# Patient Record
Sex: Male | Born: 1985
Health system: Southern US, Community
[De-identification: ages and names within clinical notes are randomized; demographics above are authoritative.]

## PROBLEM LIST (undated history)

## (undated) DIAGNOSIS — R600 Localized edema: Secondary | ICD-10-CM

## (undated) DIAGNOSIS — N289 Disorder of kidney and ureter, unspecified: Secondary | ICD-10-CM

## (undated) DIAGNOSIS — R55 Syncope and collapse: Secondary | ICD-10-CM

## (undated) DIAGNOSIS — N189 Chronic kidney disease, unspecified: Secondary | ICD-10-CM

## (undated) DIAGNOSIS — I10 Essential (primary) hypertension: Secondary | ICD-10-CM

## (undated) DIAGNOSIS — N390 Urinary tract infection, site not specified: Secondary | ICD-10-CM

## (undated) DIAGNOSIS — D649 Anemia, unspecified: Secondary | ICD-10-CM

## (undated) DIAGNOSIS — R519 Headache, unspecified: Secondary | ICD-10-CM

## (undated) DIAGNOSIS — Z992 Dependence on renal dialysis: Secondary | ICD-10-CM

## (undated) DIAGNOSIS — K529 Noninfective gastroenteritis and colitis, unspecified: Secondary | ICD-10-CM

## (undated) DIAGNOSIS — R51 Headache: Secondary | ICD-10-CM

## (undated) DIAGNOSIS — N5089 Other specified disorders of the male genital organs: Secondary | ICD-10-CM

## (undated) DIAGNOSIS — N186 End stage renal disease: Secondary | ICD-10-CM

## (undated) HISTORY — DX: Syncope and collapse: R55

## (undated) HISTORY — PX: RENAL BIOPSY: SHX156

## (undated) HISTORY — DX: Essential (primary) hypertension: I10

## (undated) HISTORY — DX: Localized edema: R60.0

## (undated) HISTORY — DX: Other specified disorders of the male genital organs: N50.89

## (undated) HISTORY — DX: Noninfective gastroenteritis and colitis, unspecified: K52.9

## (undated) HISTORY — DX: Chronic kidney disease, unspecified: N18.9

## (undated) HISTORY — DX: Disorder of kidney and ureter, unspecified: N28.9

---

## 2011-08-05 ENCOUNTER — Encounter: Payer: Self-pay | Admitting: *Deleted

## 2011-08-05 ENCOUNTER — Inpatient Hospital Stay (HOSPITAL_COMMUNITY)
Admission: EM | Admit: 2011-08-05 | Discharge: 2011-08-09 | DRG: 700 | Disposition: A | Discharge status: Home / Self Care | Payer: Self-pay | Source: Ambulatory Visit | Attending: Internal Medicine | Admitting: Internal Medicine

## 2011-08-05 DIAGNOSIS — E785 Hyperlipidemia, unspecified: Secondary | ICD-10-CM | POA: Diagnosis present

## 2011-08-05 DIAGNOSIS — E8809 Other disorders of plasma-protein metabolism, not elsewhere classified: Secondary | ICD-10-CM | POA: Diagnosis present

## 2011-08-05 DIAGNOSIS — E876 Hypokalemia: Secondary | ICD-10-CM | POA: Diagnosis present

## 2011-08-05 DIAGNOSIS — N5089 Other specified disorders of the male genital organs: Secondary | ICD-10-CM | POA: Diagnosis present

## 2011-08-05 DIAGNOSIS — R609 Edema, unspecified: Secondary | ICD-10-CM | POA: Diagnosis present

## 2011-08-05 DIAGNOSIS — R600 Localized edema: Secondary | ICD-10-CM | POA: Diagnosis present

## 2011-08-05 DIAGNOSIS — N049 Nephrotic syndrome with unspecified morphologic changes: Principal | ICD-10-CM | POA: Diagnosis present

## 2011-08-05 DIAGNOSIS — N4889 Other specified disorders of penis: Secondary | ICD-10-CM

## 2011-08-05 DIAGNOSIS — D649 Anemia, unspecified: Secondary | ICD-10-CM | POA: Diagnosis present

## 2011-08-05 DIAGNOSIS — R809 Proteinuria, unspecified: Secondary | ICD-10-CM | POA: Diagnosis present

## 2011-08-05 HISTORY — DX: Anemia, unspecified: D64.9

## 2011-08-05 NOTE — ED Notes (Signed)
He has had penis swelling and pain for the past  Week.

## 2011-08-06 ENCOUNTER — Other Ambulatory Visit: Payer: Self-pay

## 2011-08-06 ENCOUNTER — Emergency Department (HOSPITAL_COMMUNITY): Payer: Self-pay

## 2011-08-06 ENCOUNTER — Encounter (HOSPITAL_COMMUNITY): Payer: Self-pay | Admitting: *Deleted

## 2011-08-06 DIAGNOSIS — R600 Localized edema: Secondary | ICD-10-CM

## 2011-08-06 DIAGNOSIS — N5089 Other specified disorders of the male genital organs: Secondary | ICD-10-CM | POA: Diagnosis present

## 2011-08-06 DIAGNOSIS — N049 Nephrotic syndrome with unspecified morphologic changes: Secondary | ICD-10-CM | POA: Diagnosis present

## 2011-08-06 DIAGNOSIS — E876 Hypokalemia: Secondary | ICD-10-CM | POA: Diagnosis present

## 2011-08-06 DIAGNOSIS — E8809 Other disorders of plasma-protein metabolism, not elsewhere classified: Secondary | ICD-10-CM | POA: Diagnosis present

## 2011-08-06 DIAGNOSIS — R809 Proteinuria, unspecified: Secondary | ICD-10-CM | POA: Diagnosis present

## 2011-08-06 HISTORY — DX: Localized edema: R60.0

## 2011-08-06 HISTORY — DX: Other specified disorders of the male genital organs: N50.89

## 2011-08-06 LAB — COMPREHENSIVE METABOLIC PANEL
ALT: 36 U/L (ref 0–53)
AST: 45 U/L — ABNORMAL HIGH (ref 0–37)
Albumin: 1.1 g/dL — ABNORMAL LOW (ref 3.5–5.2)
Alkaline Phosphatase: 82 U/L (ref 39–117)
BUN: 17 mg/dL (ref 6–23)
CO2: 25 mEq/L (ref 19–32)
Calcium: 8.4 mg/dL (ref 8.4–10.5)
Chloride: 106 mEq/L (ref 96–112)
Creatinine, Ser: 1.39 mg/dL — ABNORMAL HIGH (ref 0.50–1.35)
GFR calc Af Amer: 82 mL/min — ABNORMAL LOW (ref >90)
GFR calc non Af Amer: 70 mL/min — ABNORMAL LOW (ref >90)
Glucose, Bld: 76 mg/dL (ref 70–99)
Potassium: 3 mEq/L — ABNORMAL LOW (ref 3.5–5.1)
Sodium: 138 mEq/L (ref 135–145)
Total Bilirubin: 0.2 mg/dL — ABNORMAL LOW (ref 0.3–1.2)
Total Protein: 4.5 g/dL — ABNORMAL LOW (ref 6.0–8.3)

## 2011-08-06 LAB — CBC
HCT: 33.5 % — ABNORMAL LOW (ref 39.0–52.0)
HCT: 32.6 % — ABNORMAL LOW (ref 39.0–52.0)
Hemoglobin: 10.7 g/dL — ABNORMAL LOW (ref 13.0–17.0)
Hemoglobin: 10.5 g/dL — ABNORMAL LOW (ref 13.0–17.0)
MCH: 28.2 pg (ref 26.0–34.0)
MCH: 28.1 pg (ref 26.0–34.0)
MCHC: 31.9 g/dL (ref 30.0–36.0)
MCHC: 32.2 g/dL (ref 30.0–36.0)
MCV: 88.4 fL (ref 78.0–100.0)
MCV: 87.2 fL (ref 78.0–100.0)
Platelets: 226 10*3/uL (ref 150–400)
Platelets: 227 10*3/uL (ref 150–400)
RBC: 3.79 MIL/uL — ABNORMAL LOW (ref 4.22–5.81)
RBC: 3.74 MIL/uL — ABNORMAL LOW (ref 4.22–5.81)
RDW: 13.7 % (ref 11.5–15.5)
RDW: 13.8 % (ref 11.5–15.5)
WBC: 5.8 10*3/uL (ref 4.0–10.5)
WBC: 5 10*3/uL (ref 4.0–10.5)

## 2011-08-06 LAB — DIFFERENTIAL
Basophils Absolute: 0 10*3/uL (ref 0.0–0.1)
Basophils Relative: 0 % (ref 0–1)
Eosinophils Absolute: 0.1 10*3/uL (ref 0.0–0.7)
Eosinophils Relative: 2 % (ref 0–5)
Lymphocytes Relative: 33 % (ref 12–46)
Lymphs Abs: 1.9 10*3/uL (ref 0.7–4.0)
Monocytes Absolute: 0.4 10*3/uL (ref 0.1–1.0)
Monocytes Relative: 8 % (ref 3–12)
Neutro Abs: 3.3 10*3/uL (ref 1.7–7.7)
Neutrophils Relative %: 57 % (ref 43–77)

## 2011-08-06 LAB — URINALYSIS, ROUTINE W REFLEX MICROSCOPIC
Bilirubin Urine: NEGATIVE
Glucose, UA: 100 mg/dL — AB
Ketones, ur: NEGATIVE mg/dL
Leukocytes, UA: NEGATIVE
Nitrite: NEGATIVE
Protein, ur: >300 mg/dL — AB
Specific Gravity, Urine: 1.015 (ref 1.005–1.030)
Urobilinogen, UA: 0.2 mg/dL (ref 0.0–1.0)
pH: 6.5 (ref 5.0–8.0)

## 2011-08-06 LAB — LIPID PANEL
Cholesterol: 415 mg/dL — ABNORMAL HIGH (ref 0–200)
HDL: 67 mg/dL (ref >39)
LDL Cholesterol: 316 mg/dL — ABNORMAL HIGH (ref 0–99)
Total CHOL/HDL Ratio: 6.2 RATIO
Triglycerides: 161 mg/dL — ABNORMAL HIGH (ref <150)
VLDL: 32 mg/dL (ref 0–40)

## 2011-08-06 LAB — RETICULOCYTES
RBC.: 3.74 MIL/uL — ABNORMAL LOW (ref 4.22–5.81)
Retic Count, Absolute: 37.4 10*3/uL (ref 19.0–186.0)
Retic Ct Pct: 1 % (ref 0.4–3.1)

## 2011-08-06 LAB — IRON AND TIBC
Iron: 43 ug/dL (ref 42–135)
Saturation Ratios: 39 % (ref 20–55)
TIBC: 110 ug/dL — ABNORMAL LOW (ref 215–435)
UIBC: 67 ug/dL — ABNORMAL LOW (ref 125–400)

## 2011-08-06 LAB — HEPATITIS B SURFACE ANTIGEN: Hepatitis B Surface Ag: NEGATIVE

## 2011-08-06 LAB — URINE MICROSCOPIC-ADD ON

## 2011-08-06 LAB — VITAMIN B12: Vitamin B-12: 252 pg/mL (ref 211–911)

## 2011-08-06 LAB — HIV ANTIBODY (ROUTINE TESTING W REFLEX): HIV: NONREACTIVE

## 2011-08-06 LAB — CREATININE, SERUM
Creatinine, Ser: 1.52 mg/dL — ABNORMAL HIGH (ref 0.50–1.35)
GFR calc Af Amer: 73 mL/min — ABNORMAL LOW (ref >90)
GFR calc non Af Amer: 63 mL/min — ABNORMAL LOW (ref >90)

## 2011-08-06 LAB — CK: Total CK: 473 U/L — ABNORMAL HIGH (ref 7–232)

## 2011-08-06 LAB — FOLATE: Folate: 15.1 ng/mL

## 2011-08-06 LAB — FERRITIN: Ferritin: 406 ng/mL — ABNORMAL HIGH (ref 22–322)

## 2011-08-06 LAB — PRO B NATRIURETIC PEPTIDE: Pro B Natriuretic peptide (BNP): 48.2 pg/mL (ref 0–125)

## 2011-08-06 LAB — HEPATITIS C ANTIBODY (REFLEX): HCV Ab: NEGATIVE

## 2011-08-06 MED ORDER — ACETAMINOPHEN 325 MG PO TABS
650.0000 mg | ORAL_TABLET | Freq: Four times a day (QID) | ORAL | Status: DC | PRN
  Administered 2011-08-06 – 2011-08-07 (×2): 650 mg via ORAL
  Filled 2011-08-06 (×2): qty 2

## 2011-08-06 MED ORDER — SODIUM CHLORIDE 0.9 % IV SOLN: 250.0000 mL | INTRAVENOUS | Status: DC

## 2011-08-06 MED ORDER — LISINOPRIL 2.5 MG PO TABS
2.5000 mg | ORAL_TABLET | Freq: Every day | ORAL | Status: DC
  Administered 2011-08-06 – 2011-08-09 (×4): 2.5 mg via ORAL
  Filled 2011-08-06 (×4): qty 1

## 2011-08-06 MED ORDER — POTASSIUM CHLORIDE CRYS ER 20 MEQ PO TBCR
60.0000 meq | EXTENDED_RELEASE_TABLET | Freq: Once | ORAL | Status: AC
  Administered 2011-08-06: 60 meq via ORAL
  Filled 2011-08-06: qty 3

## 2011-08-06 MED ORDER — ONDANSETRON HCL 4 MG PO TABS: 4.0000 mg | ORAL_TABLET | Freq: Four times a day (QID) | ORAL | Status: DC | PRN

## 2011-08-06 MED ORDER — SODIUM CHLORIDE 0.9 % IJ SOLN: 3.0000 mL | INTRAMUSCULAR | Status: DC | PRN

## 2011-08-06 MED ORDER — ENOXAPARIN SODIUM 40 MG/0.4ML ~~LOC~~ SOLN
40.0000 mg | SUBCUTANEOUS | Status: DC
  Administered 2011-08-06: 40 mg via SUBCUTANEOUS
  Filled 2011-08-06 (×2): qty 0.4

## 2011-08-06 MED ORDER — POTASSIUM CHLORIDE CRYS ER 20 MEQ PO TBCR
40.0000 meq | EXTENDED_RELEASE_TABLET | Freq: Every day | ORAL | Status: DC
  Administered 2011-08-06 – 2011-08-09 (×4): 40 meq via ORAL
  Filled 2011-08-06 (×4): qty 2

## 2011-08-06 MED ORDER — SODIUM CHLORIDE 0.9 % IJ SOLN: 3.0000 mL | Freq: Two times a day (BID) | INTRAMUSCULAR | Status: DC

## 2011-08-06 MED ORDER — HYDROCODONE-ACETAMINOPHEN 5-325 MG PO TABS
1.0000 | ORAL_TABLET | ORAL | Status: DC | PRN
  Administered 2011-08-07: 1 via ORAL
  Filled 2011-08-06: qty 1

## 2011-08-06 MED ORDER — ONDANSETRON HCL 4 MG/2ML IJ SOLN: 4.0000 mg | Freq: Four times a day (QID) | INTRAMUSCULAR | Status: DC | PRN

## 2011-08-06 MED ORDER — FUROSEMIDE 10 MG/ML IJ SOLN
40.0000 mg | Freq: Two times a day (BID) | INTRAMUSCULAR | Status: DC
  Administered 2011-08-06 – 2011-08-08 (×4): 40 mg via INTRAVENOUS
  Filled 2011-08-06 (×5): qty 4

## 2011-08-06 NOTE — Consult Note (Addendum)
Reason for Consult:Edema and proteinuria Referring Physician: Dr. Evette Cristal is an 25 y.o. male who has no past medical history or prior contact with the medical care system.  He presented to the ED with about a week of scrotal and penile edema. He was found to have a creatinine of 1.39 and >300 mg% protein on urinalysis with a serum albumin of 1.1, and was anemic with a hemoglobin of 10.7, again with no prior history.  He was admitted for further evaluation  He reports some periorbital swelling, pointed out to him by others, for about 2 weeks.  Over the past month he has gained 20-25 pounds.  He wasn't really aware of much leg swelling until it was pointed out to him today, but reports abdominal swelling for about a month.  He does not use NSAID's, has no family history of renal disease, has had no drug or occupational chemical exposures that he is aware of.  He denies change in urine output, tea or coca cola colored urine.  He has noted some phlegm in his throat from time to time, but no cough or hemoptysis, no skin rash, no arthritis or arthralgias.  He does have several tattoos, some of which were done by a friend, others done at a reputable tattoo parlor.  He is sexually active but with no risk that he is aware of for HIV.  PMH: As above - none  MEDS PTA: - no chronic meds; he took an OTC [employer] brand pain reliever for a headache about a week PTA CURRENT MEDS:     . enoxaparin  40 mg Subcutaneous Q24H  . furosemide  40 mg Intravenous BID  . potassium chloride  40 mEq Oral Daily  . potassium chloride  60 mEq Oral Once  . DISCONTD: sodium chloride  3 mL Intravenous Q12H  .  PSH:  History reviewed. No pertinent past surgical history.  Allergies: No Known Allergies  Family History:  He reports both parents alive and well, not sure about any history in other family members  Social History:  reports that he has been passively smoking Cigars.  He does not have any smokeless  tobacco history on file. He reports that he drinks alcohol. He reports that he does not use illicit drugs.  ROS: 20-25 lb weight gain, periorbital swelling, occas headache, scrotal and penile swelling, abdominal swelling; no fevers, nausea, vomiting, hematuria, dysuria, joint pain or redness, skin rash, cough, hemoptysis, arthritis or arthralgia; all other ROS essentially negative  Blood pressure 152/90, pulse 83, temperature 98.6 F (37 C), temperature source Oral, resp. rate 16, height 5\' 10"  (1.778 m), weight 87.952 kg (193 lb 14.4 oz), SpO2 100.00%.  PE:  WDWN African American male. NAD HEENT: Anicteric sclerae.  PERRLA.  Mild periorbital edema SKIN: No rashes; tattoos on neck, arms, lower abdomen Lungs: Clear  COR: S1S2 No S3 ABD: BS present.  No bruits.  Liver span normal to percussion.  Tattoo on lower abdomen.  Cannot feel spleen. EXT: 3+ pre-tibial, 4+ pedal edema GU: Penile and scrotal edema  LAB:  Creatinine, Ser  Date/Time Value Range Status  08/06/2011  2:29 AM 1.39* 0.50-1.35 (mg/dL) Final    Results for orders placed during the hospital encounter of 08/05/11 (from the past 48 hour(s))  PRO B NATRIURETIC PEPTIDE     Status: Normal   Collection Time   08/06/11  2:28 AM      Component Value Range Comment   BNP, POC 48.2  0 -  125 (pg/mL)   COMPREHENSIVE METABOLIC PANEL     Status: Abnormal   Collection Time   08/06/11  2:29 AM      Component Value Range Comment   Sodium 138  135 - 145 (mEq/L)    Potassium 3.0 (*) 3.5 - 5.1 (mEq/L)    Chloride 106  96 - 112 (mEq/L)    CO2 25  19 - 32 (mEq/L)    Glucose, Bld 76  70 - 99 (mg/dL)    BUN 17  6 - 23 (mg/dL)    Creatinine, Ser 1.39 (*) 0.50 - 1.35 (mg/dL)    Calcium 8.4  8.4 - 10.5 (mg/dL)    Total Protein 4.5 (*) 6.0 - 8.3 (g/dL)    Albumin 1.1 (*) 3.5 - 5.2 (g/dL)    AST 45 (*) 0 - 37 (U/L)    ALT 36  0 - 53 (U/L)    Alkaline Phosphatase 82  39 - 117 (U/L)    Total Bilirubin 0.2 (*) 0.3 - 1.2 (mg/dL)    GFR calc non  Af Amer 70 (*) >90 (mL/min)    GFR calc Af Amer 82 (*) >90 (mL/min)   CBC     Status: Abnormal   Collection Time   08/06/11  2:29 AM      Component Value Range Comment   WBC 5.8  4.0 - 10.5 (K/uL)    RBC 3.79 (*) 4.22 - 5.81 (MIL/uL)    Hemoglobin 10.7 (*) 13.0 - 17.0 (g/dL)    HCT 33.5 (*) 39.0 - 52.0 (%)    MCV 88.4  78.0 - 100.0 (fL)    MCH 28.2  26.0 - 34.0 (pg)    MCHC 31.9  30.0 - 36.0 (g/dL)    RDW 13.7  11.5 - 15.5 (%)    Platelets 226  150 - 400 (K/uL)   DIFFERENTIAL     Status: Normal   Collection Time   08/06/11  2:29 AM      Component Value Range Comment   Neutrophils Relative 57  43 - 77 (%)    Neutro Abs 3.3  1.7 - 7.7 (K/uL)    Lymphocytes Relative 33  12 - 46 (%)    Lymphs Abs 1.9  0.7 - 4.0 (K/uL)    Monocytes Relative 8  3 - 12 (%)    Monocytes Absolute 0.4  0.1 - 1.0 (K/uL)    Eosinophils Relative 2  0 - 5 (%)    Eosinophils Absolute 0.1  0.0 - 0.7 (K/uL)    Basophils Relative 0  0 - 1 (%)    Basophils Absolute 0.0  0.0 - 0.1 (K/uL)   URINALYSIS, ROUTINE W REFLEX MICROSCOPIC     Status: Abnormal   Collection Time   08/06/11  5:56 AM      Component Value Range Comment   Color, Urine YELLOW  YELLOW     Appearance CLEAR  CLEAR     Specific Gravity, Urine 1.015  1.005 - 1.030     pH 6.5  5.0 - 8.0     Glucose, UA 100 (*) NEGATIVE (mg/dL)    Hgb urine dipstick MODERATE (*) NEGATIVE     Bilirubin Urine NEGATIVE  NEGATIVE     Ketones, ur NEGATIVE  NEGATIVE (mg/dL)    Protein, ur >300 (*) NEGATIVE (mg/dL)    Urobilinogen, UA 0.2  0.0 - 1.0 (mg/dL)    Nitrite NEGATIVE  NEGATIVE     Leukocytes, UA NEGATIVE  NEGATIVE  URINE MICROSCOPIC-ADD ON     Status: Abnormal   Collection Time   08/06/11  5:56 AM      Component Value Range Comment   WBC, UA 3-6  <3 (WBC/hpf)    RBC / HPF 3-6  <3 (RBC/hpf)    Bacteria, UA RARE  RARE     Casts HYALINE CASTS (*) NEGATIVE     Urine-Other MUCOUS PRESENT       Dg Chest 2 View  08/06/2011  *RADIOLOGY REPORT*  Clinical Data:  Lower extremity and facial edema.  CHEST - 2 VIEW  Comparison: None.  Findings: Lungs are clear. No pleural effusion or pneumothorax. The cardiomediastinal contours are within normal limits. The visualized bones and soft tissues are without significant appreciable abnormality.  IMPRESSION: No acute cardiopulmonary process.  Original Report Authenticated By: Suanne Marker, M.D.   US Scrotum  08/06/2011  *RADIOLOGY REPORT*  Clinical Data: Edema, scrotal swelling.  ULTRASOUND OF SCROTUM  Technique:  Complete ultrasound examination of the testicles, epididymis, and other scrotal structures was performed.  Comparison:  None.  Findings:  Right testis:  Measures 4.5 x 3.7 x 3.1 cm.  Testicular microlithiasis noted.  Color Doppler flow documented.  Left testis:  Measures 4.7 x 3.8 x 3.0 cm.  Testicular microlithiasis noted.  Color Doppler flow documented.  Right epididymis:  Prominent in size however with normal color Doppler flow and echogenicity.  Left epididymis:  Prominence size however with normal color Doppler flow and echogenicity.  Hydrocele:  Absent  Varicocele:  Absent  Significant scrotal wall edema.  IMPRESSION: Bilateral testicular microlithiasis.  Otherwise normal sonographic appearance to the testicles.  Prominent epididymides however normal color Doppler flow.  Significant scrotal wall edema.  Original Report Authenticated By: Suanne Marker, M.D.     ASSESSMENT   25 year old black male with one month history of weight gain, with progressive swelling who has proteinuria, hypoalbuminemia, edema, hypertension presumably of recent onset    Renal insufficiency, proteinuria -- findings consistent with nephrotic syndrome.  Precipitating factors or risk factors not apparent from history. Could have minimal change, FSGS, SLE (less likely with lack of other constitutional symptoms) or other primary glomerulopathy  Anemia of uncertain duration  Hypertension of uncertain  duration  RECOMMENDATIONS   Will need full serologic workup, 24 hour urine studies and   renal biopsy.   Check ANA, complements, HIV, hepatitis serologies, Renal  ultrasound, spot [id number] (and 24 hour urine)   Use SCD's for DVT prophylaxis as renal biopsy will be   Needed (will get prelim labs, try to schedule bx for Friday)   Anemia workup   Check lipids   Agree with lasix  ACEi forr hypertension  Thanks for the consult.  Will follow.    Dakin Madani B 08/06/2011, 1:06 PM

## 2011-08-06 NOTE — Progress Notes (Signed)
UR review completed. 

## 2011-08-06 NOTE — H&P (Signed)
PCP:   No primary provider on file.   Chief Complaint:  Penile/scrotal edema  HPI: This is a 25 y/o [place] American male with no significant past medical history.  Patient reports that for the past week, he has noted increasing penile/scrotal edema.  Although he does not describe it as painful, he does feel uncomfortable.  He also feels that he has been gaining weight.  He has been told by his coworkers that his eyes are looking puffy, and that his legs appear to be swelling as well.  His able to pass urine, denies any dysuria.  He denies any sore throat, fever, nasal congestion, skin eruptions, chest pain, trouble breathing. In the ED he was evaluated and found to have significant proteinuria and low serum protein/albumin.  He was also noted to have significant scrotal/penile and LE edema.  He has been referred for admission for further work up.  Allergies:  No Known Allergies   History reviewed. No pertinent past medical history.   Prior to Admission medications   Medication Sig Start Date End Date Taking? Authorizing Provider  acetaminophen (TYLENOL) 325 MG tablet Take 650 mg by mouth every 6 (six) hours as needed. pain    Yes Historical Provider, MD    Social History:  reports that he has never smoked. He does not have any smokeless tobacco history on file. He reports that he drinks alcohol. He reports that he does not use illicit drugs.  Family History:  None that the patient is aware of.  Review of Systems:  Constitutional: Denies chills, diaphoresis, appetite change and fatigue, he does report occasional subjective fever  HEENT: Denies photophobia, eye pain, redness, hearing loss, ear pain, congestion, sore throat, rhinorrhea, sneezing, mouth sores, trouble swallowing, neck pain, neck stiffness and tinnitus.   Respiratory: Denies SOB, DOE, chest tightness,  and wheezing. He does report coughing while laughing on occasion  Cardiovascular: Denies chest pain, palpitations.  Positive for leg swelling bilaterally  Gastrointestinal: Denies vomiting, abdominal pain, diarrhea, constipation, blood in stool and abdominal distention. He has had nausea but now vomiting.  Genitourinary: Denies dysuria, urgency, hematuria, flank pain and difficulty urinating. He has noted some urinary frequency and scrotal/penile edema. Musculoskeletal: Denies myalgias, back pain, joint swelling, arthralgias and gait problem.  Skin: Denies pallor, rash and wound.  Neurological: Denies dizziness, seizures, syncope, weakness, light-headedness, numbness and headaches.  Hematological: Denies adenopathy. Easy bruising, personal or family bleeding history  Psychiatric/Behavioral: Denies suicidal ideation, mood changes, confusion, nervousness, sleep disturbance and agitation   Physical Exam: Blood pressure 131/85, pulse 82, temperature 97.9 F (36.6 C), temperature source Oral, resp. rate 16, SpO2 98.00%. Constitutional: Vital signs reviewed.  Patient is a well-developed and well-nourished male in no acute distress and cooperative with exam. Alert and oriented x3.  Head: Normocephalic and atraumatic Ear: TM normal bilaterally Mouth: no erythema or exudates, MMM, petechiae noted over tongue Eyes: PERRL, EOMI, conjunctivae normal, No scleral icterus.  Neck: Supple, Trachea midline normal ROM, No JVD, mass, thyromegaly, or carotid bruit present.  Cardiovascular: RRR, S1 normal, S2 normal, no MRG, pulses symmetric and intact bilaterally Pulmonary/Chest: CTAB, no wheezes, rales, or rhonchi Abdominal: Soft. Non-tender, non-distended, bowel sounds are normal, no masses, organomegaly, or guarding present.  GU: no CVA tenderness, scrotal and penile edema noted, no tenderness/ discharge/ erythema/ or calor noted Musculoskeletal: No joint deformities, erythema, or stiffness, ROM full and no nontender Hematology: no cervical, inginal, or axillary adenopathy.  Neurological: A&O x3, Strength is normal and  symmetric bilaterally,  cranial nerve II-XII are grossly intact, no focal motor deficit, sensory intact to light touch bilaterally.  Skin: Warm, dry and intact. No rash, cyanosis, or clubbing.  Psychiatric: Normal mood and affect. speech and behavior is normal. Judgment and thought content normal. Cognition and memory are normal.    Labs on Admission:  Results for orders placed during the hospital encounter of 08/05/11 (from the past 48 hour(s))  PRO B NATRIURETIC PEPTIDE     Status: Normal   Collection Time   08/06/11  2:28 AM      Component Value Range Comment   BNP, POC 48.2  0 - 125 (pg/mL)   COMPREHENSIVE METABOLIC PANEL     Status: Abnormal   Collection Time   08/06/11  2:29 AM      Component Value Range Comment   Sodium 138  135 - 145 (mEq/L)    Potassium 3.0 (*) 3.5 - 5.1 (mEq/L)    Chloride 106  96 - 112 (mEq/L)    CO2 25  19 - 32 (mEq/L)    Glucose, Bld 76  70 - 99 (mg/dL)    BUN 17  6 - 23 (mg/dL)    Creatinine, Ser 1.39 (*) 0.50 - 1.35 (mg/dL)    Calcium 8.4  8.4 - 10.5 (mg/dL)    Total Protein 4.5 (*) 6.0 - 8.3 (g/dL)    Albumin 1.1 (*) 3.5 - 5.2 (g/dL)    AST 45 (*) 0 - 37 (U/L)    ALT 36  0 - 53 (U/L)    Alkaline Phosphatase 82  39 - 117 (U/L)    Total Bilirubin 0.2 (*) 0.3 - 1.2 (mg/dL)    GFR calc non Af Amer 70 (*) >90 (mL/min)    GFR calc Af Amer 82 (*) >90 (mL/min)   CBC     Status: Abnormal   Collection Time   08/06/11  2:29 AM      Component Value Range Comment   WBC 5.8  4.0 - 10.5 (K/uL)    RBC 3.79 (*) 4.22 - 5.81 (MIL/uL)    Hemoglobin 10.7 (*) 13.0 - 17.0 (g/dL)    HCT 33.5 (*) 39.0 - 52.0 (%)    MCV 88.4  78.0 - 100.0 (fL)    MCH 28.2  26.0 - 34.0 (pg)    MCHC 31.9  30.0 - 36.0 (g/dL)    RDW 13.7  11.5 - 15.5 (%)    Platelets 226  150 - 400 (K/uL)   DIFFERENTIAL     Status: Normal   Collection Time   08/06/11  2:29 AM      Component Value Range Comment   Neutrophils Relative 57  43 - 77 (%)    Neutro Abs 3.3  1.7 - 7.7 (K/uL)    Lymphocytes  Relative 33  12 - 46 (%)    Lymphs Abs 1.9  0.7 - 4.0 (K/uL)    Monocytes Relative 8  3 - 12 (%)    Monocytes Absolute 0.4  0.1 - 1.0 (K/uL)    Eosinophils Relative 2  0 - 5 (%)    Eosinophils Absolute 0.1  0.0 - 0.7 (K/uL)    Basophils Relative 0  0 - 1 (%)    Basophils Absolute 0.0  0.0 - 0.1 (K/uL)   URINALYSIS, ROUTINE W REFLEX MICROSCOPIC     Status: Abnormal   Collection Time   08/06/11  5:56 AM      Component Value Range Comment   Color, Urine YELLOW  YELLOW     Appearance CLEAR  CLEAR     Specific Gravity, Urine 1.015  1.005 - 1.030     pH 6.5  5.0 - 8.0     Glucose, UA 100 (*) NEGATIVE (mg/dL)    Hgb urine dipstick MODERATE (*) NEGATIVE     Bilirubin Urine NEGATIVE  NEGATIVE     Ketones, ur NEGATIVE  NEGATIVE (mg/dL)    Protein, ur >300 (*) NEGATIVE (mg/dL)    Urobilinogen, UA 0.2  0.0 - 1.0 (mg/dL)    Nitrite NEGATIVE  NEGATIVE     Leukocytes, UA NEGATIVE  NEGATIVE    URINE MICROSCOPIC-ADD ON     Status: Abnormal   Collection Time   08/06/11  5:56 AM      Component Value Range Comment   WBC, UA 3-6  <3 (WBC/hpf)    RBC / HPF 3-6  <3 (RBC/hpf)    Bacteria, UA RARE  RARE     Casts HYALINE CASTS (*) NEGATIVE     Urine-Other MUCOUS PRESENT       Radiological Exams on Admission: No results found.  Assessment/Plan   Nephrotic syndrome:  Unclear etiology, Patient has >300 protein in urine.  Will check spot protein/creatinine ratio, and 24hr urine for protein/creatinine.  I have asked the Renal service to see the patient in consultation.  Will check lipid panel.   Serum albumin decreased:  See above   Proteinuria: see above   Edema of lower extremity:  Start lasix and monitor urine output   Scrotal edema:  Ultrasound of scrotum negative, will give lasix and monitor for improvement   Hypokalemia:  Replete, check Mg   Anemia: check anemia panel   Time Spent on Admission: 68mins  Xylon Croom 08/06/2011, 8:52 AM

## 2011-08-06 NOTE — ED Notes (Signed)
Patient is resting comfortably. 

## 2011-08-06 NOTE — Progress Notes (Signed)
Christopher Wang [id number] Code Status: Full Admission Data: 08/06/2011 3:50 PM Attending Provider:  [facility] PCP:No primary provider on file. Consults/ Treatment Team:  Nephrology  Christopher Wang is a 25 y.o. male patient admitted from ED awake, alert - orient. X 3 - no acute distress noted.  VSS - Blood pressure 131/82, pulse 83, temperature 98.6 F (37 C), temperature source Oral, resp. rate 16, height 5\' 10"  (1.778 m), weight 87.952 kg (193 lb 14.4 oz), SpO2 100.00%.  IV in right hand (11/6) NSL.  Allergies:  No Known Allergies    Medications Prior to Admission  Medication Dose Route Frequency Provider Last Rate Last Dose  . acetaminophen (TYLENOL) tablet 650 mg  650 mg Oral Q6H PRN Jehanzeb Memon   650 mg at 08/06/11 1131  . enoxaparin (LOVENOX) injection 40 mg  40 mg Subcutaneous Q24H Jehanzeb Memon   40 mg at 08/06/11 1237  . furosemide (LASIX) injection 40 mg  40 mg Intravenous BID Jehanzeb Memon      . HYDROcodone-acetaminophen (NORCO) 5-325 MG per tablet 1-2 tablet  1-2 tablet Oral Q4H PRN Jehanzeb Memon      . lisinopril (PRINIVIL,ZESTRIL) tablet 2.5 mg  2.5 mg Oral Daily Lucrezia Starch, MD   2.5 mg at 08/06/11 1524  . ondansetron (ZOFRAN) tablet 4 mg  4 mg Oral Q6H PRN Jehanzeb Memon       Or  . ondansetron (ZOFRAN) injection 4 mg  4 mg Intravenous Q6H PRN Jehanzeb Memon      . potassium chloride SA (K-DUR,KLOR-CON) CR tablet 40 mEq  40 mEq Oral Daily Jehanzeb Memon   40 mEq at 08/06/11 1237  . potassium chloride SA (K-DUR,KLOR-CON) CR tablet 60 mEq  60 mEq Oral Once Trisha Mangle, MD   60 mEq at 08/06/11 0345  . DISCONTD: 0.9 %  sodium chloride infusion  250 mL Intravenous Continuous Jehanzeb Memon      . DISCONTD: sodium chloride 0.9 % injection 3 mL  3 mL Intravenous Q12H Jehanzeb Memon      . DISCONTD: sodium chloride 0.9 % injection 3 mL  3 mL Intravenous PRN Jehanzeb Memon       No current outpatient prescriptions on file as of 08/06/2011.    Orientation to  room, and floor completed with information packet given to patient/family.  Patient declined safety video at this time.  Admission INP armband ID verified with patient/family, and in place.   SR up x 2, fall assessment complete, with patient: able to verbalize understanding of risk associated with falls, and verbalized understanding to call nsg before up out of bed.  Call light within reach, patient able to voice, and demonstrate understanding.  Skin, clean-dry- intact without evidence of bruising, or skin tears.  Bilateral lower edema noted with severe scrotal and penile edema. 24 hour urine collection started on admission. No evidence of skin break down noted on exam.  Will cont to eval and treat per MD orders.  Driggers, Allene Pyo, RN 08/06/2011 3:50 PM

## 2011-08-06 NOTE — ED Notes (Signed)
Pt to ED for eval of swelling of his penis; pt reports that for the past week, his penis has been swelling; pt reports discomfort when walking, but denies pain;  Pt also noted to have swelling to lower extremities; pt denies dysuria or abnormal discharge

## 2011-08-06 NOTE — ED Notes (Signed)
Pt unable to void at this time. 

## 2011-08-06 NOTE — ED Provider Notes (Signed)
History     CSN: [id number] Arrival date & time: 08/05/2011 11:30 PM   First MD Initiated Contact with Patient 08/06/11 0117      Chief Complaint  Patient presents with  . Groin Swelling    (Consider location/radiation/quality/duration/timing/severity/associated sxs/prior treatment) HPI Comments: Acute onset scrotal and penile swelling which has progressively worsened over the past week. It is relatively painless but uncomfortable with ambulation. There is no urinary symptoms. He has no chest pain, shortness of breath, abdominal pain. He also notes some mild lower terminates swelling. He has no primary care physician.  Patient is a 25 y.o. male presenting with testicular pain. The history is provided by the patient. No language interpreter was used.  Testicle Pain This is a new problem. The current episode started more than 2 days ago (about one week). The problem occurs constantly. The problem has been gradually worsening. Pertinent negatives include no chest pain, no abdominal pain, no headaches and no shortness of breath. The symptoms are aggravated by nothing. The symptoms are relieved by nothing. He has tried nothing for the symptoms.    History reviewed. No pertinent past medical history.  History reviewed. No pertinent past surgical history.  No family history on file.  History  Substance Use Topics  . Smoking status: Never Smoker   . Smokeless tobacco: Not on file  . Alcohol Use: Yes      Review of Systems  Constitutional: Negative for fever, activity change, appetite change and fatigue.  HENT: Negative for congestion, sore throat, rhinorrhea, neck pain and neck stiffness.   Respiratory: Negative for cough and shortness of breath.   Cardiovascular: Negative for chest pain and palpitations.  Gastrointestinal: Negative for nausea, vomiting and abdominal pain.  Genitourinary: Positive for penile swelling and scrotal swelling. Negative for dysuria, urgency, frequency,  flank pain and testicular pain.  Musculoskeletal: Negative for back pain.  Neurological: Negative for dizziness, light-headedness, numbness and headaches.  All other systems reviewed and are negative.    Allergies  Review of patient's allergies indicates no known allergies.  Home Medications   Current Outpatient Rx  Name Route Sig Dispense Refill  . ACETAMINOPHEN 325 MG PO TABS Oral Take 650 mg by mouth every 6 (six) hours as needed. pain       BP 131/85  Pulse 82  Temp(Src) 97.9 F (36.6 C) (Oral)  Resp 16  SpO2 98%  Physical Exam  Nursing note and vitals reviewed. Constitutional: He is oriented to person, place, and time. He appears well-developed and well-nourished. No distress.  HENT:  Head: Normocephalic and atraumatic.  Mouth/Throat: Oropharynx is clear and moist.  Eyes: Conjunctivae and EOM are normal. Pupils are equal, round, and reactive to light.  Neck: Normal range of motion. Neck supple.  Cardiovascular: Normal rate, regular rhythm, normal heart sounds and intact distal pulses.  Exam reveals no gallop and no friction rub.   No murmur heard. Pulmonary/Chest: Effort normal and breath sounds normal. No respiratory distress.  Abdominal: Soft. Bowel sounds are normal. There is no tenderness.  Genitourinary:       Penile edema and scrotal edema which is significant.  Musculoskeletal: He exhibits edema (3+ edema).  Neurological: He is alert and oriented to person, place, and time.  Skin: Skin is warm and dry. No rash noted.    ED Course  Procedures (including critical care time)   Date: 08/06/2011  Rate: 84  Rhythm: normal sinus rhythm  QRS Axis: normal  Intervals: normal  ST/T Wave abnormalities: ST  elevations diffusely and early repolarization  Conduction Disutrbances:none  Narrative Interpretation:   Old EKG Reviewed: none available  Labs Reviewed  URINALYSIS, ROUTINE W REFLEX MICROSCOPIC - Abnormal; Notable for the following:    Glucose, UA 100 (*)      Hgb urine dipstick MODERATE (*)    Protein, ur >300 (*)    All other components within normal limits  COMPREHENSIVE METABOLIC PANEL - Abnormal; Notable for the following:    Potassium 3.0 (*)    Creatinine, Ser 1.39 (*)    Total Protein 4.5 (*)    Albumin 1.1 (*)    AST 45 (*)    Total Bilirubin 0.2 (*)    GFR calc non Af Amer 70 (*)    GFR calc Af Amer 82 (*)    All other components within normal limits  CBC - Abnormal; Notable for the following:    RBC 3.79 (*)    Hemoglobin 10.7 (*)    HCT 33.5 (*)    All other components within normal limits  URINE MICROSCOPIC-ADD ON - Abnormal; Notable for the following:    Casts HYALINE CASTS (*)    All other components within normal limits  PRO B NATRIURETIC PEPTIDE  DIFFERENTIAL   Dg Chest 2 View  08/06/2011  *RADIOLOGY REPORT*  Clinical Data: Lower extremity and facial edema.  CHEST - 2 VIEW  Comparison: None.  Findings: Lungs are clear. No pleural effusion or pneumothorax. The cardiomediastinal contours are within normal limits. The visualized bones and soft tissues are without significant appreciable abnormality.  IMPRESSION: No acute cardiopulmonary process.  Original Report Authenticated By: Suanne Marker, M.D.   US Scrotum  08/06/2011  *RADIOLOGY REPORT*  Clinical Data: Edema, scrotal swelling.  ULTRASOUND OF SCROTUM  Technique:  Complete ultrasound examination of the testicles, epididymis, and other scrotal structures was performed.  Comparison:  None.  Findings:  Right testis:  Measures 4.5 x 3.7 x 3.1 cm.  Testicular microlithiasis noted.  Color Doppler flow documented.  Left testis:  Measures 4.7 x 3.8 x 3.0 cm.  Testicular microlithiasis noted.  Color Doppler flow documented.  Right epididymis:  Prominent in size however with normal color Doppler flow and echogenicity.  Left epididymis:  Prominence size however with normal color Doppler flow and echogenicity.  Hydrocele:  Absent  Varicocele:  Absent  Significant scrotal wall  edema.  IMPRESSION: Bilateral testicular microlithiasis.  Otherwise normal sonographic appearance to the testicles.  Prominent epididymides however normal color Doppler flow.  Significant scrotal wall edema.  Original Report Authenticated By: Suanne Marker, M.D.     1. Scrotal edema   2. Penile edema   3. Nephrotic syndrome       MDM  Patient with no primary care. Acute onset of scrotal edema, penile edema, lower term edema secondary to nephrotic syndrome. He has exceedingly high level of protein in his urine. Creatinine is 1.39 which is slightly elevated. Hepatic functions relatively unremarkable. BMP negative. EKG relatively unremarkable. Scrotal ultrasound showed no testicular abnormalities but did show scrotal edema. I discussed the case with the [facility] hospitalists wouldn't the patient for further evaluation and treatment        Trisha Mangle, MD 08/06/11 [phone]

## 2011-08-07 ENCOUNTER — Other Ambulatory Visit: Payer: Self-pay | Admitting: Physician Assistant

## 2011-08-07 ENCOUNTER — Inpatient Hospital Stay (HOSPITAL_COMMUNITY): Payer: Self-pay

## 2011-08-07 DIAGNOSIS — E785 Hyperlipidemia, unspecified: Secondary | ICD-10-CM | POA: Diagnosis present

## 2011-08-07 LAB — COMPREHENSIVE METABOLIC PANEL
ALT: 23 U/L (ref 0–53)
AST: 21 U/L (ref 0–37)
Albumin: 0.9 g/dL — ABNORMAL LOW (ref 3.5–5.2)
Alkaline Phosphatase: 55 U/L (ref 39–117)
BUN: 15 mg/dL (ref 6–23)
CO2: 28 mEq/L (ref 19–32)
Calcium: 7.9 mg/dL — ABNORMAL LOW (ref 8.4–10.5)
Chloride: 107 mEq/L (ref 96–112)
Creatinine, Ser: 1.44 mg/dL — ABNORMAL HIGH (ref 0.50–1.35)
GFR calc Af Amer: 78 mL/min — ABNORMAL LOW (ref >90)
GFR calc non Af Amer: 67 mL/min — ABNORMAL LOW (ref >90)
Glucose, Bld: 87 mg/dL (ref 70–99)
Potassium: 3.4 mEq/L — ABNORMAL LOW (ref 3.5–5.1)
Sodium: 139 mEq/L (ref 135–145)
Total Bilirubin: 0.2 mg/dL — ABNORMAL LOW (ref 0.3–1.2)
Total Protein: 3.8 g/dL — ABNORMAL LOW (ref 6.0–8.3)

## 2011-08-07 LAB — CBC
HCT: 31.2 % — ABNORMAL LOW (ref 39.0–52.0)
Hemoglobin: 10 g/dL — ABNORMAL LOW (ref 13.0–17.0)
MCH: 27.9 pg (ref 26.0–34.0)
MCHC: 32.1 g/dL (ref 30.0–36.0)
MCV: 87.2 fL (ref 78.0–100.0)
Platelets: 189 10*3/uL (ref 150–400)
RBC: 3.58 MIL/uL — ABNORMAL LOW (ref 4.22–5.81)
RDW: 13.8 % (ref 11.5–15.5)
WBC: 5.1 10*3/uL (ref 4.0–10.5)

## 2011-08-07 LAB — MAGNESIUM: Magnesium: 1.7 mg/dL (ref 1.5–2.5)

## 2011-08-07 LAB — ANA: Anti Nuclear Antibody(ANA): NEGATIVE

## 2011-08-07 LAB — C4 COMPLEMENT: Complement C4, Body Fluid: 31 mg/dL (ref 10–40)

## 2011-08-07 LAB — CREATININE, URINE, 24 HOUR
Collection Interval-UCRE24: 24 hours
Creatinine, 24H Ur: 1687 mg/d (ref 800–2000)
Creatinine, Urine: 46.85 mg/dL
Urine Total Volume-UCRE24: 3600 mL

## 2011-08-07 LAB — TSH: TSH: 3.713 u[IU]/mL (ref 0.350–4.500)

## 2011-08-07 LAB — ANTISTREPTOLYSIN O TITER: ASO: <25 IU/mL (ref 0–408)

## 2011-08-07 LAB — PROTEIN, URINE, 24 HOUR
Collection Interval-UPROT: 24 hours
Protein, 24H Urine: 14436 mg/d — ABNORMAL HIGH (ref 50–100)
Urine Total Volume-UPROT: 3600 mL

## 2011-08-07 LAB — C3 COMPLEMENT: C3 Complement: 102 mg/dL (ref 90–180)

## 2011-08-07 LAB — HEMOGLOBIN A1C
Hgb A1c MFr Bld: 4.8 % (ref <5.7)
Mean Plasma Glucose: 91 mg/dL (ref <117)

## 2011-08-07 LAB — PROTEIN / CREATININE RATIO, URINE
Creatinine, Urine: 46.85 mg/dL
Protein Creatinine Ratio: 8.72 — ABNORMAL HIGH (ref 0.00–0.15)
Total Protein, Urine: 408.5 mg/dL

## 2011-08-07 MED ORDER — POTASSIUM CHLORIDE CRYS ER 20 MEQ PO TBCR
40.0000 meq | EXTENDED_RELEASE_TABLET | Freq: Every day | ORAL | Status: DC
  Administered 2011-08-07: 40 meq via ORAL
  Filled 2011-08-07: qty 1
  Filled 2011-08-07: qty 2

## 2011-08-07 MED ORDER — PREDNISONE 50 MG PO TABS
60.0000 mg | ORAL_TABLET | Freq: Every day | ORAL | Status: DC
  Administered 2011-08-08 – 2011-08-09 (×2): 60 mg via ORAL
  Filled 2011-08-07 (×3): qty 1

## 2011-08-07 MED ORDER — METHYLPREDNISOLONE SODIUM SUCC 125 MG IJ SOLR
100.0000 mg | Freq: Once | INTRAMUSCULAR | Status: AC
  Administered 2011-08-07: 100 mg via INTRAVENOUS
  Filled 2011-08-07: qty 2

## 2011-08-07 NOTE — Progress Notes (Signed)
Subjective: Interval History: has complaints head ache. Good urine output..  Objective: Vital signs in last 24 hours: Temp:  [98.1 F (36.7 Wang)-98.7 F (37.1 Wang)] 98.1 F (36.7 Wang) (11/08 0449) Pulse Rate:  [77-99] 79  (11/08 0449) Resp:  [16-18] 18  (11/08 0449) BP: (102-152)/(63-90) 102/66 mmHg (11/08 0449) SpO2:  [96 %-100 %] 99 % (11/08 0449) Weight:  [87.635 kg (193 lb 3.2 oz)-88.315 kg (194 lb 11.2 oz)] 193 lb 3.2 oz (87.635 kg) (11/08 0124) Weight change:   Intake/Output from previous day: 11/07 0701 - 11/08 0700 In: 2220 [P.O.:2220] Out: 1500 [Urine:1500] Intake/Output this shift:    General appearance: alert and cooperative Back: symmetric,2-3 + presacral edema Resp: clear to auscultation bilaterally Cardio: regular rate and rhythm Extremities: 3 plus edema  Lab Results:  Basename 08/07/11 0540 08/06/11 1520  WBC 5.1 5.0  HGB 10.0* 10.5*  HCT 31.2* 32.6*  PLT 189 227   BMET:  Basename 08/07/11 0540 08/06/11 1520 08/06/11 0229  NA 139 -- 138  K 3.4* -- 3.0*  CL 107 -- 106  CO2 28 -- 25  GLUCOSE 87 -- 76  BUN 15 -- 17  CREATININE 1.44* 1.52* --  CALCIUM 7.9* -- 8.4   No results found for this basename: PTH:2 in the last 72 hours Iron Studies:  Basename 08/06/11 1520  IRON 43  TIBC 110*  TRANSFERRIN --  FERRITIN 406*    Studies/Results: Dg Chest 2 View  08/06/2011  *RADIOLOGY REPORT*  Clinical Data: Lower extremity and facial edema.  CHEST - 2 VIEW  Comparison: None.  Findings: Lungs are clear. No pleural effusion or pneumothorax. The cardiomediastinal contours are within normal limits. The visualized bones and soft tissues are without significant appreciable abnormality.  IMPRESSION: No acute cardiopulmonary process.  Original Report Authenticated By: Suanne Marker, M.D.   US Scrotum  08/06/2011  *RADIOLOGY REPORT*  Clinical Data: Edema, scrotal swelling.  ULTRASOUND OF SCROTUM  Technique:  Complete ultrasound examination of the testicles,  epididymis, and other scrotal structures was performed.  Comparison:  None.  Findings:  Right testis:  Measures 4.5 x 3.7 x 3.1 cm.  Testicular microlithiasis noted.  Color Doppler flow documented.  Left testis:  Measures 4.7 x 3.8 x 3.0 cm.  Testicular microlithiasis noted.  Color Doppler flow documented.  Right epididymis:  Prominent in size however with normal color Doppler flow and echogenicity.  Left epididymis:  Prominence size however with normal color Doppler flow and echogenicity.  Hydrocele:  Absent  Varicocele:  Absent  Significant scrotal wall edema.  IMPRESSION: Bilateral testicular microlithiasis.  Otherwise normal sonographic appearance to the testicles.  Prominent epididymides however normal color Doppler flow.  Significant scrotal wall edema.  Original Report Authenticated By: Suanne Marker, M.D.    I have reviewed the patient's current medications. Scheduled:   . furosemide  40 mg Intravenous BID  . lisinopril  2.5 mg Oral Daily  . potassium chloride  40 mEq Oral Daily  . DISCONTD: enoxaparin  40 mg Subcutaneous Q24H  . DISCONTD: sodium chloride  3 mL Intravenous Q12H    Assessment/Plan: Nephrotic Syndrome  Plan:  Renal Biopsy   Potassium replacement  Diurese    LOS: 2 days   Christopher Wang 08/07/2011,9:09 AM

## 2011-08-07 NOTE — Plan of Care (Signed)
Problem: Phase II Progression Outcomes Goal: Discharge plan established Outcome: Progressing Patient is pending kidney biopsy for friday

## 2011-08-07 NOTE — Plan of Care (Signed)
Problem: Phase III Progression Outcomes Goal: IV/normal saline lock discontinued Outcome: Progressing NSL

## 2011-08-07 NOTE — Progress Notes (Signed)
I spoke with Dr. Lorrene Reid regarding this patient. We both feel emperic steroid therapy is appropriate. Will give Solumedrol 100mg  iv today, then Prednisone 60mg  daily.

## 2011-08-07 NOTE — Plan of Care (Signed)
Problem: Phase III Progression Outcomes Goal: Activity at appropriate level-compared to baseline (UP IN CHAIR FOR HEMODIALYSIS)  Outcome: Not Applicable Date Met:  [id number] Patient ambulatory and independent of all ADL's

## 2011-08-07 NOTE — Progress Notes (Signed)
Subjective: No events overnight.  Anxious about biopsy tomorrow.  Objective:  Vital signs in last 24 hours:  Filed Vitals:   08/06/11 2102 08/07/11 0124 08/07/11 0449 08/07/11 0900  BP: 108/63  102/66   Pulse: 99  79   Temp: 98.4 F (36.9 C)  98.1 F (36.7 C)   TempSrc: Oral  Oral   Resp: 17  18   Height: 5\' 2"  (1.575 m)     Weight: 88.315 kg (194 lb 11.2 oz) 87.635 kg (193 lb 3.2 oz)  84.5 kg (186 lb 4.6 oz)  SpO2: 96%  99%     Intake/Output from previous day:   Intake/Output Summary (Last 24 hours) at 08/07/11 1411 Last data filed at 08/07/11 1130  Gross per 24 hour  Intake   1980 ml  Output   3000 ml  Net  -1020 ml    Physical Exam: General: Alert, awake, oriented x3, in no acute distress. HEENT: No bruits, no goiter. Heart: Regular rate and rhythm, without murmurs, rubs, gallops. Lungs: Clear to auscultation bilaterally. Abdomen: Soft, nontender, nondistended, positive bowel sounds. Extremities: No clubbing cyanosis,with positive pedal pulses. Has 3+edema in LE bilaterally Neuro: Grossly intact, nonfocal.   Lab Results:  Basic Metabolic Panel:    Component Value Date/Time   NA 139 08/07/2011 0540   K 3.4* 08/07/2011 0540   CL 107 08/07/2011 0540   CO2 28 08/07/2011 0540   BUN 15 08/07/2011 0540   CREATININE 1.44* 08/07/2011 0540   GLUCOSE 87 08/07/2011 0540   CALCIUM 7.9* 08/07/2011 0540   CBC:    Component Value Date/Time   WBC 5.1 08/07/2011 0540   HGB 10.0* 08/07/2011 0540   HCT 31.2* 08/07/2011 0540   PLT 189 08/07/2011 0540   MCV 87.2 08/07/2011 0540   NEUTROABS 3.3 08/06/2011 0229   LYMPHSABS 1.9 08/06/2011 0229   MONOABS 0.4 08/06/2011 0229   EOSABS 0.1 08/06/2011 0229   BASOSABS 0.0 08/06/2011 0229    No results found for this or any previous visit (from the past 240 hour(s)).  Studies/Results: Dg Chest 2 View  08/06/2011  *RADIOLOGY REPORT*  Clinical Data: Lower extremity and facial edema.  CHEST - 2 VIEW  Comparison: None.  Findings: Lungs are  clear. No pleural effusion or pneumothorax. The cardiomediastinal contours are within normal limits. The visualized bones and soft tissues are without significant appreciable abnormality.  IMPRESSION: No acute cardiopulmonary process.  Original Report Authenticated By: Suanne Marker, M.D.   US Scrotum  08/06/2011  *RADIOLOGY REPORT*  Clinical Data: Edema, scrotal swelling.  ULTRASOUND OF SCROTUM  Technique:  Complete ultrasound examination of the testicles, epididymis, and other scrotal structures was performed.  Comparison:  None.  Findings:  Right testis:  Measures 4.5 x 3.7 x 3.1 cm.  Testicular microlithiasis noted.  Color Doppler flow documented.  Left testis:  Measures 4.7 x 3.8 x 3.0 cm.  Testicular microlithiasis noted.  Color Doppler flow documented.  Right epididymis:  Prominent in size however with normal color Doppler flow and echogenicity.  Left epididymis:  Prominence size however with normal color Doppler flow and echogenicity.  Hydrocele:  Absent  Varicocele:  Absent  Significant scrotal wall edema.  IMPRESSION: Bilateral testicular microlithiasis.  Otherwise normal sonographic appearance to the testicles.  Prominent epididymides however normal color Doppler flow.  Significant scrotal wall edema.  Original Report Authenticated By: Suanne Marker, M.D.    Medications: Scheduled Meds:   . furosemide  40 mg Intravenous BID  . lisinopril  2.5 mg Oral Daily  . potassium chloride  40 mEq Oral Daily  . potassium chloride  40 mEq Oral Daily  . DISCONTD: enoxaparin  40 mg Subcutaneous Q24H   Continuous Infusions:  PRN Meds:.acetaminophen, HYDROcodone-acetaminophen, ondansetron (ZOFRAN) IV, ondansetron  Assessment/Plan:   Nephrotic syndrome: Appreciate Renal assistance, for renal biopsy tomorrow, started on ACEI. Continue Lasix for edema and watch urine output   Serum albumin decreased  Proteinuria  Edema of lower extremity  Scrotal edema  Anemia  Hyperlipidemia, felt to be  secondary to decreased catabolism in nephrotic syndrome.  Will treat nephrotic syndrome depending on cause, and if hyperlipidemia persists then statin therapy may be considered.      LOS: 2 days   MEMON,JEHANZEB 08/07/2011, 2:11 PM

## 2011-08-08 ENCOUNTER — Inpatient Hospital Stay (HOSPITAL_COMMUNITY): Payer: Self-pay

## 2011-08-08 ENCOUNTER — Encounter: Payer: Self-pay | Admitting: Nephrology

## 2011-08-08 ENCOUNTER — Other Ambulatory Visit: Payer: Self-pay | Admitting: Diagnostic Radiology

## 2011-08-08 LAB — BASIC METABOLIC PANEL
BUN: 24 mg/dL — ABNORMAL HIGH (ref 6–23)
CO2: 28 mEq/L (ref 19–32)
Calcium: 8.2 mg/dL — ABNORMAL LOW (ref 8.4–10.5)
Chloride: 107 mEq/L (ref 96–112)
Creatinine, Ser: 1.52 mg/dL — ABNORMAL HIGH (ref 0.50–1.35)
GFR calc Af Amer: 73 mL/min — ABNORMAL LOW (ref >90)
GFR calc non Af Amer: 63 mL/min — ABNORMAL LOW (ref >90)
Glucose, Bld: 126 mg/dL — ABNORMAL HIGH (ref 70–99)
Potassium: 3.7 mEq/L (ref 3.5–5.1)
Sodium: 140 mEq/L (ref 135–145)

## 2011-08-08 LAB — PROTEIN ELECTROPHORESIS, SERUM
Albumin ELP: 42.1 % — ABNORMAL LOW (ref 55.8–66.1)
Alpha-1-Globulin: 5.9 % — ABNORMAL HIGH (ref 2.9–4.9)
Alpha-2-Globulin: 29 % — ABNORMAL HIGH (ref 7.1–11.8)
Beta 2: 7.2 % — ABNORMAL HIGH (ref 3.2–6.5)
Beta Globulin: 4.6 % — ABNORMAL LOW (ref 4.7–7.2)
Gamma Globulin: 11.2 % (ref 11.1–18.8)
M-Spike, %: NOT DETECTED g/dL
Total Protein ELP: 3.7 g/dL — ABNORMAL LOW (ref 6.0–8.3)

## 2011-08-08 LAB — CBC
HCT: 32 % — ABNORMAL LOW (ref 39.0–52.0)
HCT: 35.3 % — ABNORMAL LOW (ref 39.0–52.0)
Hemoglobin: 10.2 g/dL — ABNORMAL LOW (ref 13.0–17.0)
Hemoglobin: 11.3 g/dL — ABNORMAL LOW (ref 13.0–17.0)
MCH: 28 pg (ref 26.0–34.0)
MCH: 28 pg (ref 26.0–34.0)
MCHC: 31.9 g/dL (ref 30.0–36.0)
MCHC: 32 g/dL (ref 30.0–36.0)
MCV: 87.9 fL (ref 78.0–100.0)
MCV: 87.6 fL (ref 78.0–100.0)
Platelets: 239 10*3/uL (ref 150–400)
Platelets: 264 10*3/uL (ref 150–400)
RBC: 3.64 MIL/uL — ABNORMAL LOW (ref 4.22–5.81)
RBC: 4.03 MIL/uL — ABNORMAL LOW (ref 4.22–5.81)
RDW: 13.9 % (ref 11.5–15.5)
RDW: 14.1 % (ref 11.5–15.5)
WBC: 9.8 10*3/uL (ref 4.0–10.5)
WBC: 12.8 10*3/uL — ABNORMAL HIGH (ref 4.0–10.5)

## 2011-08-08 LAB — PROTIME-INR
INR: 0.88 (ref 0.00–1.49)
Prothrombin Time: 12.1 seconds (ref 11.6–15.2)

## 2011-08-08 MED ORDER — SIMVASTATIN 20 MG PO TABS
20.0000 mg | ORAL_TABLET | Freq: Every day | ORAL | Status: DC
  Administered 2011-08-08: 20 mg via ORAL
  Filled 2011-08-08 (×2): qty 1

## 2011-08-08 MED ORDER — SODIUM CHLORIDE 0.9 % IV SOLN
INTRAVENOUS | Status: AC | PRN
  Administered 2011-08-08: 20 mL/h via INTRAVENOUS

## 2011-08-08 MED ORDER — LORAZEPAM 2 MG/ML IJ SOLN
INTRAMUSCULAR | Status: AC | PRN
  Administered 2011-08-08: 1 mg via INTRAVENOUS

## 2011-08-08 MED ORDER — MIDAZOLAM HCL 5 MG/5ML IJ SOLN
INTRAMUSCULAR | Status: DC | PRN
  Administered 2011-08-08: 1 mg via INTRAVENOUS

## 2011-08-08 MED ORDER — FENTANYL CITRATE 0.05 MG/ML IJ SOLN
INTRAMUSCULAR | Status: AC | PRN
  Administered 2011-08-08 (×4): 50 ug via INTRAVENOUS

## 2011-08-08 MED ORDER — FUROSEMIDE 40 MG PO TABS
40.0000 mg | ORAL_TABLET | Freq: Two times a day (BID) | ORAL | Status: DC
  Administered 2011-08-08 – 2011-08-09 (×2): 40 mg via ORAL
  Filled 2011-08-08 (×4): qty 1

## 2011-08-08 MED ORDER — MIDAZOLAM HCL 5 MG/5ML IJ SOLN
INTRAMUSCULAR | Status: AC | PRN
  Administered 2011-08-08 (×3): 1 mg via INTRAVENOUS

## 2011-08-08 NOTE — Procedures (Signed)
Ultrasound guided biopsy of the left kidney.  2 core samples.  No immediate complication.  See full Radiology report.

## 2011-08-08 NOTE — Progress Notes (Signed)
Patient seen and examined, agree with above, please see my note for further details.

## 2011-08-08 NOTE — H&P (Addendum)
Patient is for random renal needle core biopsy today for nephrotic syndrome.  Procedure details, risks of the procedure including but not limited to infection, bleeding, organ damage, complications with sedation and inadequate specimen reviewed with the patient with his apparent understanding.  PE performed and is consistent with that on admission.  CV: RRR, No murmurs, rubs or gallops Resp. CTA Bilaterally  Airway assessment : 2    Written consent obtained, plan for biopsy under US guidance later this am after labs reviewed.

## 2011-08-08 NOTE — Progress Notes (Signed)
Subjective: No events overnight. Had renal biopsy today, having some pain at biopsy site, no other complaints.  Objective:  Vital signs in last 24 hours:  Filed Vitals:   08/08/11 1310 08/08/11 1324 08/08/11 1354 08/08/11 1424  BP: 139/79 122/64 122/65 130/84  Pulse: 91 78 79 94  Temp:      TempSrc:      Resp:      Height:      Weight:      SpO2:        Intake/Output from previous day:   Intake/Output Summary (Last 24 hours) at 08/08/11 1606 Last data filed at 08/08/11 1448  Gross per 24 hour  Intake    830 ml  Output   1500 ml  Net   -670 ml    Physical Exam: General: Alert, awake, oriented x3, in no acute distress. HEENT: No bruits, no goiter. Heart: Regular rate and rhythm, without murmurs, rubs, gallops. Lungs: Clear to auscultation bilaterally. Abdomen: Soft, nontender, nondistended, positive bowel sounds. Extremities: 1+ edema, scrotal edema has improved as well Neuro: Grossly intact, nonfocal.   Lab Results:  Basic Metabolic Panel:    Component Value Date/Time   NA 140 08/08/2011 0550   K 3.7 08/08/2011 0550   CL 107 08/08/2011 0550   CO2 28 08/08/2011 0550   BUN 24* 08/08/2011 0550   CREATININE 1.52* 08/08/2011 0550   GLUCOSE 126* 08/08/2011 0550   CALCIUM 8.2* 08/08/2011 0550   CBC:    Component Value Date/Time   WBC 9.8 08/08/2011 0550   HGB 10.2* 08/08/2011 0550   HCT 32.0* 08/08/2011 0550   PLT 239 08/08/2011 0550   MCV 87.9 08/08/2011 0550   NEUTROABS 3.3 08/06/2011 0229   LYMPHSABS 1.9 08/06/2011 0229   MONOABS 0.4 08/06/2011 0229   EOSABS 0.1 08/06/2011 0229   BASOSABS 0.0 08/06/2011 0229    No results found for this or any previous visit (from the past 240 hour(s)).  Studies/Results: US Renal  08/08/2011  *RADIOLOGY REPORT*  Clinical Data: Nephrotic syndrome  RENAL/URINARY TRACT ULTRASOUND COMPLETE  Comparison:  None.  Findings:  Right Kidney:  13.0 cm in length.  No hydronephrosis or mass. Normal cortical echogenicity.  Left Kidney:  13.3 cm  in length. No mass.  Normal cortical echogenicity. Mild hydronephrosis  Bladder:  Within normal limits.  Bilateral ureteral jets are identified.  IMPRESSION: Mild left hydronephrosis.  Bilateral ureteral jets exclude ureteral obstruction.  Original Report Authenticated By: Jamas Lav, M.D.   US Biopsy  08/08/2011  [facility]     08/08/2011 11:46 AM Ultrasound guided biopsy of the left kidney.  2 core samples.  No  immediate complication.  See full Radiology report.    Medications: Scheduled Meds:   . furosemide  40 mg Oral BID  . lisinopril  2.5 mg Oral Daily  . potassium chloride  40 mEq Oral Daily  . predniSONE  60 mg Oral QAC breakfast  . simvastatin  20 mg Oral q1800  . DISCONTD: furosemide  40 mg Intravenous BID  . DISCONTD: potassium chloride  40 mEq Oral Daily   Continuous Infusions:   . sodium chloride 20 mL/hr (08/08/11 1046)   PRN Meds:.sodium chloride, acetaminophen, fentaNYL, HYDROcodone-acetaminophen, LORazepam, midazolam, midazolam, ondansetron (ZOFRAN) IV, ondansetron  Assessment/Plan:  Principal Problem:  *Nephrotic syndrome Active Problems:  Serum albumin decreased  Proteinuria  Edema of lower extremity  Scrotal edema  Hypokalemia  Anemia  Hyperlipidemia  Plan: Had renal biopsy today Started on steroids/statin per  renal team Lasix changed to po, edema appears to have improved since admission Possible d/c in am if remains stable   LOS: 3 days   MEMON,JEHANZEB 08/08/2011, 4:06 PM

## 2011-08-08 NOTE — Progress Notes (Signed)
Subjective: The patient is concerned about a mandatory work meeting that he has Sunday morning at 10 AM. He wants to attend this meeting. No other complaints. No vomiting, fever, pain, diarrhea. Swelling in his feet legs and scrotum persists.  He hopes for discharge as soon as possible.  Objective:  Vital signs in last 24 hours:  Filed Vitals:   08/07/11 0900 08/07/11 1412 08/07/11 2100 08/08/11 0457  BP:  125/79 133/90 127/76  Pulse:  107 102 82  Temp:  98.2 F (36.8 C) 98.2 F (36.8 C) 98.1 F (36.7 C)  TempSrc:  Oral Oral Oral  Resp:  17 18 17   Height:      Weight: 84.5 kg (186 lb 4.6 oz)   84.3 kg (185 lb 13.6 oz)  SpO2:  100% 97% 97%    Intake/Output from previous day:   Intake/Output Summary (Last 24 hours) at 08/08/11 0728 Last data filed at 08/08/11 0600  Gross per 24 hour  Intake   1430 ml  Output   2200 ml  Net   -770 ml    Physical Exam: General: Alert, awake, oriented x3, in no acute distress.  HEENT: No bruits, no goiter.  Heart: Regular rate and rhythm, without murmurs, rubs, gallops.  Lungs: Clear to auscultation bilaterally.  Abdomen: Soft, nontender, nondistended, positive bowel sounds.  Extremities: No clubbing cyanosis,with positive pedal pulses. Has 1-2+ edema in LE bilaterally  Neuro: Grossly intact, nonfocal. Psych:  Pleasant, Appropriate, grooming is good.  Lab Results:  Basic Metabolic Panel:    Component Value Date/Time   NA 140 08/08/2011 0550   K 3.7 08/08/2011 0550   CL 107 08/08/2011 0550   CO2 28 08/08/2011 0550   BUN 24* 08/08/2011 0550   CREATININE 1.52* 08/08/2011 0550   GLUCOSE 126* 08/08/2011 0550   CALCIUM 8.2* 08/08/2011 0550   CBC:    Component Value Date/Time   WBC 9.8 08/08/2011 0550   HGB 10.2* 08/08/2011 0550   HCT 32.0* 08/08/2011 0550   PLT 239 08/08/2011 0550   MCV 87.9 08/08/2011 0550   NEUTROABS 3.3 08/06/2011 0229   LYMPHSABS 1.9 08/06/2011 0229   MONOABS 0.4 08/06/2011 0229   EOSABS 0.1 08/06/2011 0229   BASOSABS 0.0  08/06/2011 0229      Studies/Results: US Renal  08/08/2011  *RADIOLOGY REPORT*  Clinical Data: Nephrotic syndrome  RENAL/URINARY TRACT ULTRASOUND COMPLETE  Comparison:  None.  Findings:  Right Kidney:  13.0 cm in length.  No hydronephrosis or mass. Normal cortical echogenicity.  Left Kidney:  13.3 cm in length. No mass.  Normal cortical echogenicity. Mild hydronephrosis  Bladder:  Within normal limits.  Bilateral ureteral jets are identified.  IMPRESSION: Mild left hydronephrosis.  Bilateral ureteral jets exclude ureteral obstruction.  Original Report Authenticated By: Jamas Lav, M.D.    Medications: Scheduled Meds:   . furosemide  40 mg Intravenous BID  . lisinopril  2.5 mg Oral Daily  . methylPREDNISolone (SOLU-MEDROL) injection  100 mg Intravenous Once  . potassium chloride  40 mEq Oral Daily  . potassium chloride  40 mEq Oral Daily  . predniSONE  60 mg Oral QAC breakfast   Continuous Infusions:  PRN Meds:.acetaminophen, HYDROcodone-acetaminophen, ondansetron (ZOFRAN) IV, ondansetron  Assessment/Plan:  Principal Problem:  *Nephrotic syndrome - for renal biopsy today.  Next steps per nephrology.   Active Problems:  Serum albumin decreased - secondary to nephrotic syndrome. - nutrition consult requested.  Proteinuria  Edema of lower extremity  Scrotal edema  Hypokalemia - Resolved.  Will continue one dose of kcl 40 meq daily while being diuresed.  Anemia  Hyperlipidemia, felt to be secondary to decreased catabolism in nephrotic syndrome. Will treat nephrotic syndrome depending on cause, and if hyperlipidemia persists then statin therapy may be considered.      LOS: 3 days   YORK,Djuna Frechette L 08/08/2011, 7:28 AM

## 2011-08-08 NOTE — Progress Notes (Addendum)
S:pt currently in radiology for renal biopsy  O: will return to see patient when he returns from renal biopsy; date were reviewed  BP 115/69  Pulse 81  Temp(Src) 98.1 F (36.7 C) (Oral)  Resp 16  Ht 5\' 2"  (1.575 m)  Wt 84.3 kg (185 lb 13.6 oz)  BMI 33.99 kg/m2  SpO2 100%   Intake/Output Summary (Last 24 hours) at 08/08/11 1128 Last data filed at 08/08/11 0600  Gross per 24 hour  Intake    710 ml  Output    850 ml  Net   -140 ml    Weight change: -3.452 kg (-7 lb 9.8 oz) Scheduled Meds:   . furosemide  40 mg Intravenous BID  . lisinopril  2.5 mg Oral Daily  . methylPREDNISolone (SOLU-MEDROL) injection  100 mg Intravenous Once  . potassium chloride  40 mEq Oral Daily  . predniSONE  60 mg Oral QAC breakfast  . DISCONTD: potassium chloride  40 mEq Oral Daily   Continuous Infusions:   . sodium chloride 20 mL/hr (08/08/11 1046)   PRN Meds:.sodium chloride, acetaminophen, fentaNYL, HYDROcodone-acetaminophen, midazolam, ondansetron (ZOFRAN) IV, ondansetron Basic Metabolic Panel:  Basename 08/08/11 0550 08/07/11 0540  NA 140 139  K 3.7 3.4*  CL 107 107  CO2 28 28  GLUCOSE 126* 87  BUN 24* 15  CREATININE 1.52* 1.44*  CALCIUM 8.2* 7.9*  MG -- 1.7  PHOS -- --   Liver Function Tests:  [facility] 08/07/11 0540 08/06/11 0229  AST 21 45*  ALT 23 36  ALKPHOS 55 82  BILITOT 0.2* 0.2*  PROT 3.8* 4.5*  ALBUMIN 0.9* 1.1*   CBC:  Basename 08/08/11 0550 08/07/11 0540 08/06/11 0229  WBC 9.8 5.1 --  NEUTROABS -- -- 3.3  HGB 10.2* 10.0* --  HCT 32.0* 31.2* --  MCV 87.9 87.2 --  PLT 239 189 --   Anemia Panel:  Basename 08/06/11 1520  VITAMINB12 252  FOLATE 15.1  FERRITIN 406*  TIBC 110*  IRON 43  RETICCTPCT 1.0   Results for KAIVEN, NEASON (MRN [id number]) as of 08/08/2011 11:32  Ref. Range 08/06/2011 05:56 08/06/2011 11:25 08/06/2011 11:25 08/06/2011 11:26  Total Protein, Urine No range found  408.5    PROTEIN CREATININE RATIO Latest Range: 0.00-0.15   8.72 (H)      Creatinine, Urine No range found  46.85 46.85   Urine Total Volume-UCRE24 No range found   3600   Collection Interval-UCRE24 No range found   24   Creatinine, 24H Ur Latest Range: [phone] mg/day   1687   Urine Total Volume-UPROT No range found    3600  Collection Interval-UPROT No range found    24  Protein, 24H Urine Latest Range: 50-100 mg/day    14436 (H)   Results for WISE, MOTHERWAY (MRN [id number]) as of 08/08/2011 11:32  Ref. Range 08/06/2011 15:11  ANA Latest Range: NEGATIVE  NEGATIVE  ASO Latest Range: 0-408 IU/mL <25  C3 Complement Latest Range: 90-180 mg/dL 102  Complement C4, Body Fluid Latest Range: 10-40 mg/dL 31   US Renal  08/08/2011  *RADIOLOGY REPORT*  Clinical Data: Nephrotic syndrome  RENAL/URINARY TRACT ULTRASOUND COMPLETE  Comparison:  None.  Findings:  Right Kidney:  13.0 cm in length.  No hydronephrosis or mass. Normal cortical echogenicity.  Left Kidney:  13.3 cm in length. No mass.  Normal cortical echogenicity. Mild hydronephrosis  Bladder:  Within normal limits.  Bilateral ureteral jets are identified.  IMPRESSION: Mild left hydronephrosis.  Bilateral ureteral jets exclude  ureteral obstruction.  Original Report Authenticated By: Jamas Lav, M.D.   New onset nephrotic syndrome    Renal biopsy today   Have started empiric steroids (received solumedrol   100 X 1 yesterday, and is on prednisone 60/day   Continue ACE inhibitor; change to PO lasix   Add statin   If no bleeding post biopsy he could be D/C in AM with  followup in my office once biopsy results return   Unclear significance of mild left hydro;  when biopsy   done they will relook at kidneys     .Craig [place] B  Patient has returned from renal biopsy. Has c/o of mild back pain. Last void not bloody Lungs clear 1+ edema Labs and other studies as earlier CBC planned for 4PM Tissue sent to [facility] Nephropathology  Dr. Lorrene Reid 1:35 PM .

## 2011-08-08 NOTE — Progress Notes (Signed)
Nutrition dx:  Nutrition-related knowledge deficit r/t nephrotic syndrome AEB no need for prior education.   Intervention:  Brief education;  Provided.  Goals of nutrition therapy discussed.   Discussed with patient dietary requirements for nephrotic syndrome. Handout provided.  Understanding confirmed.  RD contact information provided.  Monitoring:  Knowledge; for questions.  Please consult RD if new questions present.  Pager:  [phone] Larey Seat 02:56 PM

## 2011-08-09 ENCOUNTER — Encounter: Payer: Self-pay | Admitting: Nephrology

## 2011-08-09 DIAGNOSIS — I10 Essential (primary) hypertension: Secondary | ICD-10-CM

## 2011-08-09 HISTORY — DX: Essential (primary) hypertension: I10

## 2011-08-09 LAB — RENAL FUNCTION PANEL
Albumin: 1.1 g/dL — ABNORMAL LOW (ref 3.5–5.2)
BUN: 20 mg/dL (ref 6–23)
CO2: 26 mEq/L (ref 19–32)
Calcium: 8.4 mg/dL (ref 8.4–10.5)
Chloride: 103 mEq/L (ref 96–112)
Creatinine, Ser: 1.39 mg/dL — ABNORMAL HIGH (ref 0.50–1.35)
GFR calc Af Amer: 82 mL/min — ABNORMAL LOW (ref >90)
GFR calc non Af Amer: 70 mL/min — ABNORMAL LOW (ref >90)
Glucose, Bld: 82 mg/dL (ref 70–99)
Phosphorus: 3.1 mg/dL (ref 2.3–4.6)
Potassium: 3.7 mEq/L (ref 3.5–5.1)
Sodium: 136 mEq/L (ref 135–145)

## 2011-08-09 LAB — CBC
HCT: 38.6 % — ABNORMAL LOW (ref 39.0–52.0)
Hemoglobin: 12.4 g/dL — ABNORMAL LOW (ref 13.0–17.0)
MCH: 28.6 pg (ref 26.0–34.0)
MCHC: 32.1 g/dL (ref 30.0–36.0)
MCV: 88.9 fL (ref 78.0–100.0)
Platelets: 182 10*3/uL (ref 150–400)
RBC: 4.34 MIL/uL (ref 4.22–5.81)
RDW: 14.3 % (ref 11.5–15.5)
WBC: 9.1 10*3/uL (ref 4.0–10.5)

## 2011-08-09 MED ORDER — LISINOPRIL 2.5 MG PO TABS: 2.5000 mg | ORAL_TABLET | Freq: Every day | ORAL | Status: DC

## 2011-08-09 MED ORDER — PREDNISONE 20 MG PO TABS: 60.0000 mg | ORAL_TABLET | Freq: Every day | ORAL | Status: AC

## 2011-08-09 MED ORDER — SIMVASTATIN 20 MG PO TABS: 20.0000 mg | ORAL_TABLET | Freq: Every day | ORAL | Status: DC

## 2011-08-09 MED ORDER — FUROSEMIDE 40 MG PO TABS: 40.0000 mg | ORAL_TABLET | Freq: Two times a day (BID) | ORAL | Status: DC

## 2011-08-09 MED ORDER — POTASSIUM CHLORIDE CRYS ER 20 MEQ PO TBCR: 40.0000 meq | EXTENDED_RELEASE_TABLET | Freq: Every day | ORAL | Status: DC

## 2011-08-09 NOTE — Discharge Summary (Signed)
Patient ID: Christopher Wang MRN: [id number] DOB/AGE: 1985-10-04 25 y.o.  Admit date: 08/05/2011 Discharge date: 08/09/2011  Primary Care Physician:  No primary provider on file.  Discharge Diagnoses:    .Nephrotic syndrome .Serum albumin decreased .Proteinuria .Edema of lower extremity .Scrotal edema .Hypokalemia .Anemia .Hyperlipidemia     Current Discharge Medication List    START taking these medications   Details  furosemide (LASIX) 40 MG tablet Take 1 tablet (40 mg total) by mouth 2 (two) times daily. Qty: 60 tablet, Refills: 0    lisinopril (PRINIVIL,ZESTRIL) 2.5 MG tablet Take 1 tablet (2.5 mg total) by mouth daily. Qty: 30 tablet, Refills: 0    potassium chloride SA (K-DUR,KLOR-CON) 20 MEQ tablet Take 2 tablets (40 mEq total) by mouth daily. Qty: 60 tablet, Refills: 0    predniSONE (DELTASONE) 20 MG tablet Take 3 tablets (60 mg total) by mouth daily before breakfast. Qty: 90 tablet, Refills: 0    simvastatin (ZOCOR) 20 MG tablet Take 1 tablet (20 mg total) by mouth daily at 6 PM. Qty: 30 tablet, Refills: 0      CONTINUE these medications which have NOT CHANGED   Details  acetaminophen (TYLENOL) 325 MG tablet Take 650 mg by mouth every 6 (six) hours as needed. pain         Disposition and Follow-up: Patient has scheduled appointment follow ups with Dr. Lorrene Reid and [facility]  Consults: Nephrology, Dr. Lorrene Reid  Significant Diagnostic Studies:  Dg Chest 2 View  08/06/2011  *RADIOLOGY REPORT*  Clinical Data: Lower extremity and facial edema.  CHEST - 2 VIEW  Comparison: None.  Findings: Lungs are clear. No pleural effusion or pneumothorax. The cardiomediastinal contours are within normal limits. The visualized bones and soft tissues are without significant appreciable abnormality.  IMPRESSION: No acute cardiopulmonary process.  Original Report Authenticated By: Suanne Marker, M.D.   US Scrotum  08/06/2011  *RADIOLOGY REPORT*  Clinical Data: Edema,  scrotal swelling.  ULTRASOUND OF SCROTUM  Technique:  Complete ultrasound examination of the testicles, epididymis, and other scrotal structures was performed.  Comparison:  None.  Findings:  Right testis:  Measures 4.5 x 3.7 x 3.1 cm.  Testicular microlithiasis noted.  Color Doppler flow documented.  Left testis:  Measures 4.7 x 3.8 x 3.0 cm.  Testicular microlithiasis noted.  Color Doppler flow documented.  Right epididymis:  Prominent in size however with normal color Doppler flow and echogenicity.  Left epididymis:  Prominence size however with normal color Doppler flow and echogenicity.  Hydrocele:  Absent  Varicocele:  Absent  Significant scrotal wall edema.  IMPRESSION: Bilateral testicular microlithiasis.  Otherwise normal sonographic appearance to the testicles.  Prominent epididymides however normal color Doppler flow.  Significant scrotal wall edema.  Original Report Authenticated By: Suanne Marker, M.D.    Brief H and P: For complete details please refer to admission H and P, but in brief This is a Christopher Wang with no significant past medical history. Patient reports that for the past week, he has noted increasing penile/scrotal edema. Although he does not describe it as painful, he does feel uncomfortable. He also feels that he has been gaining weight. He has been told by his coworkers that his eyes are looking puffy, and that his legs appear to be swelling as well. His able to pass urine, denies any dysuria. He denies any sore throat, fever, nasal congestion, skin eruptions, chest pain, trouble breathing. In the ED he was evaluated and found to have significant  proteinuria and low serum protein/albumin. He was also noted to have significant scrotal/penile and LE edema. He has been referred for admission for further work up.  Physical Exam on Discharge:  Filed Vitals:   08/08/11 1354 08/08/11 1424 08/08/11 2100 08/09/11 0506  BP: 122/65 130/84 114/67 105/63  Pulse: 79 94  110 82  Temp:   98.4 F (36.9 C) 97.9 F (36.6 C)  TempSrc:   Oral Axillary  Resp:   18 18  Height:      Weight:    81.7 kg (180 lb 1.9 oz)  SpO2:   98% 99%     Intake/Output Summary (Last 24 hours) at 08/09/11 1312 Last data filed at 08/09/11 1125  Gross per 24 hour  Intake    480 ml  Output   1600 ml  Net  -1120 ml    General: Alert, awake, oriented x3, in no acute distress. HEENT: No bruits, no goiter. Heart: Regular rate and rhythm, without murmurs, rubs, gallops. Lungs: Clear to auscultation bilaterally. Abdomen: Soft, nontender, nondistended, positive bowel sounds. Extremities: 1+ edema bilaterally Neuro: Grossly intact, nonfocal.  CBC:    Component Value Date/Time   WBC 9.1 08/09/2011 0836   HGB 12.4* 08/09/2011 0836   HCT 38.6* 08/09/2011 0836   PLT 182 08/09/2011 0836   MCV 88.9 08/09/2011 0836   NEUTROABS 3.3 08/06/2011 0229   LYMPHSABS 1.9 08/06/2011 0229   MONOABS 0.4 08/06/2011 0229   EOSABS 0.1 08/06/2011 0229   BASOSABS 0.0 08/06/2011 [id number]    Basic Metabolic Panel:    Component Value Date/Time   NA 136 08/09/2011 0836   K 3.7 08/09/2011 0836   CL 103 08/09/2011 0836   CO2 26 08/09/2011 0836   BUN 20 08/09/2011 0836   CREATININE 1.39* 08/09/2011 0836   GLUCOSE 82 08/09/2011 0836   CALCIUM 8.4 08/09/2011 0836    Hospital Course:    *Nephrotic syndrome, Patient had significant proteinuria.  He was seen in consultation by the nephrology service and underwent renal biopsy, results of which are currently pending.  He has been started empirically on prednisone and will plan on further follow up for biopsy results and further treatment with [facility].  He was started on lasix which has helped with his generalized edema.  For his hyperlipidemia he was started on a statin, this may be followed up by nephrology.  He is felt stable to d/c home today for outpatient follow up.   Serum albumin decreased  Proteinuria  Edema of lower extremity   Scrotal edema  Hypokalemia  Anemia  Hyperlipidemia   Time spent on Discharge: 44mins  Signed: [name] 08/09/2011, 1:12 PM

## 2011-08-09 NOTE — Plan of Care (Signed)
Problem: Phase III Progression Outcomes Goal: IV/normal saline lock discontinued Outcome: Completed in [facility] Date Met:  08/09/11 D/c IV Goal: Discharge plan remains appropriate-arrangements made Outcome: Completed/Met Date Met:  08/09/11 D/c home with daughter  Problem: Discharge Progression Outcomes Goal: Discharge plan in place and appropriate Outcome: Completed/Met Date Met:  08/09/11 D/c home with daughter

## 2011-08-09 NOTE — Progress Notes (Signed)
S: Has done fine post biopsy  O: No new issues  Scheduled Meds:   . furosemide  40 mg Oral BID  . lisinopril  2.5 mg Oral Daily  . potassium chloride  40 mEq Oral Daily  . predniSONE  60 mg Oral QAC breakfast  . simvastatin  20 mg Oral q1800   PRN Meds:.acetaminophen, fentaNYL, HYDROcodone-acetaminophen, LORazepam, midazolam, midazolam, ondansetron (ZOFRAN) IV, ondansetron  BP 105/63  Pulse 82  Temp(Src) 97.9 F (36.6 C) (Axillary)  Resp 18  Ht 5\' 2"  (1.575 m)  Wt 81.7 kg (180 lb 1.9 oz)  BMI 32.94 kg/m2  SpO2 99%   Intake/Output Summary (Last 24 hours) at 08/09/11 1049 Last data filed at 08/09/11 0919  Gross per 24 hour  Intake    480 ml  Output   1800 ml  Net  -1320 ml    Weight change: -2.8 kg (-6 lb 2.8 oz)  EXAM: Gen: looks well [id number] Resp:Clear [id number] not changed  Labs: Basic Metabolic Panel:  Lab [id number] 0836 08/08/11 0550 08/07/11 0540  NA 136 140 139  K 3.7 3.7 3.4*  CL 103 107 107  CO2 26 28 28   GLUCOSE 82 126* 87  BUN 20 24* 15  CREATININE 1.39* 1.52* 1.44*  CALCIUM 8.4 8.2* 7.9*  MG -- -- 1.7  PHOS 3.1 -- --    Liver Function Tests:  Lab 08/09/11 0836 08/07/11 0540 08/06/11 0229  AST -- 21 45*  ALT -- 23 36  ALKPHOS -- 55 82  BILITOT -- 0.2* 0.2*  PROT -- 3.8* 4.5*  ALBUMIN 1.1* 0.9* 1.1*   No results found for this basename: LIPASE:3,AMYLASE:3 in the last 168 hours No results found for this basename: AMMONIA:3 in the last 168 hours  CBC:  Lab 08/09/11 0836 08/08/11 1545 08/08/11 0550 08/07/11 0540 08/06/11 1520 08/06/11 0229  WBC 9.1 12.8* 9.8 -- -- --  NEUTROABS -- -- -- -- -- 3.3  HGB 12.4* 11.3* 10.2* -- -- --  HCT 38.6* 35.3* 32.0* -- -- --  MCV 88.9 87.6 87.9 87.2 87.2 --  PLT PLATELET CLUMPS NOTED ON SMEAR, UNABLE TO ESTIMATE 264 239 -- -- --    Cardiac Enzymes:  Lab 08/06/11 1511  CKTOTAL 473*  CKMB --  CKMBINDEX --  TROPONINI --     Iron Studies:  Basename 08/06/11 1520  IRON 43    TIBC 110*  TRANSFERRIN --  FERRITIN 406*   Studies/Results: US Renal  08/08/2011  *RADIOLOGY REPORT*  Clinical Data: Nephrotic syndrome  RENAL/URINARY TRACT ULTRASOUND COMPLETE  Comparison:  None.  Findings:  Right Kidney:  13.0 cm in length.  No hydronephrosis or mass. Normal cortical echogenicity.  Left Kidney:  13.3 cm in length. No mass.  Normal cortical echogenicity. Mild hydronephrosis  Bladder:  Within normal limits.  Bilateral ureteral jets are identified.  IMPRESSION: Mild left hydronephrosis.  Bilateral ureteral jets exclude ureteral obstruction.  Original Report Authenticated By: Jamas Lav, M.D.   US Biopsy  08/08/2011  [facility]     08/08/2011 11:46 AM Ultrasound guided biopsy of the left kidney.  2 core samples.  No  immediate complication.  See full Radiology report.   . Assessment/Plan:  New onset nephrotic syndrome - stable post biopsy.  Labs are all stable (creatinine stable on ACE).  HOPEFUL to find a therapy responsive lesion - high on list of possible etiologies are minimal change disease or FSGS but obviously won't know specifically until biopsy available.  Therapy thus  far: S/p 1 dose SoluMedrol 100 mg and on prednisone 60 mg/day  Expect biopsy prelim result first of the week  OK to discharge patient to home on his current doses of prednisone, ACE, simvastatin, potassuim and lasix  He should follow up with me at [facility] ([address] [phone]) on November 15 - arrive at the office at 2:30 PM for 3:00 PM appointment.  He should call in the meantime if severe back pain, bloody urine or fever.  I have given him a letter for his work.  Christopher Wang

## 2011-08-11 LAB — MPO/PR-3 (ANCA) ANTIBODIES

## 2011-08-15 DIAGNOSIS — N041 Nephrotic syndrome with focal and segmental glomerular lesions: Secondary | ICD-10-CM | POA: Insufficient documentation

## 2011-11-27 ENCOUNTER — Observation Stay (HOSPITAL_BASED_OUTPATIENT_CLINIC_OR_DEPARTMENT_OTHER)
Admission: EM | Admit: 2011-11-27 | Discharge: 2011-11-27 | Disposition: A | Discharge status: Home / Self Care | Payer: Self-pay | Attending: Emergency Medicine | Admitting: Emergency Medicine

## 2011-11-27 ENCOUNTER — Encounter (HOSPITAL_BASED_OUTPATIENT_CLINIC_OR_DEPARTMENT_OTHER): Payer: Self-pay | Admitting: *Deleted

## 2011-11-27 DIAGNOSIS — IMO0002 Reserved for concepts with insufficient information to code with codable children: Secondary | ICD-10-CM | POA: Insufficient documentation

## 2011-11-27 DIAGNOSIS — N049 Nephrotic syndrome with unspecified morphologic changes: Secondary | ICD-10-CM | POA: Insufficient documentation

## 2011-11-27 DIAGNOSIS — E860 Dehydration: Secondary | ICD-10-CM | POA: Insufficient documentation

## 2011-11-27 DIAGNOSIS — R112 Nausea with vomiting, unspecified: Principal | ICD-10-CM | POA: Insufficient documentation

## 2011-11-27 DIAGNOSIS — R197 Diarrhea, unspecified: Secondary | ICD-10-CM | POA: Insufficient documentation

## 2011-11-27 DIAGNOSIS — I10 Essential (primary) hypertension: Secondary | ICD-10-CM | POA: Insufficient documentation

## 2011-11-27 DIAGNOSIS — Z79899 Other long term (current) drug therapy: Secondary | ICD-10-CM | POA: Insufficient documentation

## 2011-11-27 DIAGNOSIS — R6883 Chills (without fever): Secondary | ICD-10-CM | POA: Insufficient documentation

## 2011-11-27 DIAGNOSIS — R1110 Vomiting, unspecified: Secondary | ICD-10-CM

## 2011-11-27 LAB — DIFFERENTIAL
Basophils Absolute: 0 10*3/uL (ref 0.0–0.1)
Basophils Relative: 0 % (ref 0–1)
Eosinophils Absolute: 0 10*3/uL (ref 0.0–0.7)
Eosinophils Relative: 0 % (ref 0–5)
Lymphocytes Relative: 6 % — ABNORMAL LOW (ref 12–46)
Lymphs Abs: 0.8 10*3/uL (ref 0.7–4.0)
Monocytes Absolute: 1.2 10*3/uL — ABNORMAL HIGH (ref 0.1–1.0)
Monocytes Relative: 9 % (ref 3–12)
Neutro Abs: 12.1 10*3/uL — ABNORMAL HIGH (ref 1.7–7.7)
Neutrophils Relative %: 85 % — ABNORMAL HIGH (ref 43–77)

## 2011-11-27 LAB — COMPREHENSIVE METABOLIC PANEL
ALT: 20 U/L (ref 0–53)
Albumin: 1.6 g/dL — ABNORMAL LOW (ref 3.5–5.2)
Alkaline Phosphatase: 52 U/L (ref 39–117)
BUN: 20 mg/dL (ref 6–23)
CO2: 33 mEq/L — ABNORMAL HIGH (ref 19–32)
Calcium: 8.6 mg/dL (ref 8.4–10.5)
Chloride: 99 mEq/L (ref 96–112)
Creatinine, Ser: 1.1 mg/dL (ref 0.50–1.35)
GFR calc Af Amer: >90 mL/min (ref >90)
GFR calc non Af Amer: >90 mL/min (ref >90)
Glucose, Bld: 78 mg/dL (ref 70–99)
Potassium: 3.4 mEq/L — ABNORMAL LOW (ref 3.5–5.1)
Sodium: 141 mEq/L (ref 135–145)
Total Bilirubin: 0.4 mg/dL (ref 0.3–1.2)
Total Protein: 5.4 g/dL — ABNORMAL LOW (ref 6.0–8.3)

## 2011-11-27 LAB — CBC
HCT: 47 % (ref 39.0–52.0)
Hemoglobin: 15.5 g/dL (ref 13.0–17.0)
MCH: 29.9 pg (ref 26.0–34.0)
MCHC: 33 g/dL (ref 30.0–36.0)
MCV: 90.7 fL (ref 78.0–100.0)
Platelets: 237 10*3/uL (ref 150–400)
RBC: 5.18 MIL/uL (ref 4.22–5.81)
RDW: 13.4 % (ref 11.5–15.5)
WBC: 14.1 10*3/uL — ABNORMAL HIGH (ref 4.0–10.5)

## 2011-11-27 LAB — URINE MICROSCOPIC-ADD ON

## 2011-11-27 LAB — URINALYSIS, ROUTINE W REFLEX MICROSCOPIC
Bilirubin Urine: NEGATIVE
Glucose, UA: 100 mg/dL — AB
Ketones, ur: NEGATIVE mg/dL
Leukocytes, UA: NEGATIVE
Nitrite: NEGATIVE
Protein, ur: >300 mg/dL — AB
Specific Gravity, Urine: 1.042 — ABNORMAL HIGH (ref 1.005–1.030)
Urobilinogen, UA: 0.2 mg/dL (ref 0.0–1.0)
pH: 6.5 (ref 5.0–8.0)

## 2011-11-27 MED ORDER — ACETAMINOPHEN 325 MG PO TABS
650.0000 mg | ORAL_TABLET | ORAL | Status: DC | PRN
  Administered 2011-11-27 (×2): 650 mg via ORAL
  Filled 2011-11-27 (×2): qty 2

## 2011-11-27 MED ORDER — FUROSEMIDE 40 MG PO TABS
40.0000 mg | ORAL_TABLET | Freq: Two times a day (BID) | ORAL | Status: DC
  Administered 2011-11-27: 40 mg via ORAL
  Filled 2011-11-27: qty 1

## 2011-11-27 MED ORDER — ONDANSETRON HCL 4 MG/2ML IJ SOLN
4.0000 mg | Freq: Once | INTRAMUSCULAR | Status: AC
  Administered 2011-11-27: 4 mg via INTRAVENOUS
  Filled 2011-11-27: qty 2

## 2011-11-27 MED ORDER — SODIUM CHLORIDE 0.9 % IV SOLN
INTRAVENOUS | Status: DC
  Administered 2011-11-27: 17:00:00 via INTRAVENOUS

## 2011-11-27 MED ORDER — ONDANSETRON HCL 4 MG PO TABS: 4.0000 mg | ORAL_TABLET | Freq: Four times a day (QID) | ORAL | Status: AC

## 2011-11-27 MED ORDER — ONDANSETRON HCL 4 MG/2ML IJ SOLN
4.0000 mg | Freq: Four times a day (QID) | INTRAMUSCULAR | Status: DC | PRN
  Administered 2011-11-27: 4 mg via INTRAVENOUS
  Filled 2011-11-27: qty 2

## 2011-11-27 MED ORDER — SODIUM CHLORIDE 0.9 % IV BOLUS (SEPSIS)
250.0000 mL | Freq: Once | INTRAVENOUS | Status: AC
  Administered 2011-11-27: 11:00:00 via INTRAVENOUS

## 2011-11-27 MED ORDER — POTASSIUM CHLORIDE CRYS ER 20 MEQ PO TBCR
40.0000 meq | EXTENDED_RELEASE_TABLET | Freq: Every day | ORAL | Status: DC
  Administered 2011-11-27: 40 meq via ORAL
  Filled 2011-11-27: qty 2

## 2011-11-27 MED ORDER — PREDNISONE 20 MG PO TABS
40.0000 mg | ORAL_TABLET | Freq: Every day | ORAL | Status: DC
  Administered 2011-11-27: 40 mg via ORAL
  Filled 2011-11-27: qty 2

## 2011-11-27 NOTE — ED Notes (Signed)
Patient has been in the restroom for the last 20 minutes.

## 2011-11-27 NOTE — ED Provider Notes (Signed)
Patient has been on CDU dehydration protocol here since change of shift.  Patient has not tolerated a significant amount of by mouth fluids as well as popsicles.  Patient is comfortable going home as he's had no further nausea and vomiting.  His vital signs have normalized as well.  Please patient is able to be safely discharged home in [place] likely viral gastroenteritis and is now doing much better at this time.  Lezlie Octave, MD 11/27/11 [phone]

## 2011-11-27 NOTE — ED Notes (Signed)
Patient ambulated to bathroom without difficulty.  Patient is requesting to have his iv removed "for a break".  Orders explained r/t IV infusion.

## 2011-11-27 NOTE — ED Provider Notes (Addendum)
History     CSN: [id number]  Arrival date & time 11/27/11  [id number]   First MD Initiated Contact with Patient 11/27/11 [phone]      Chief Complaint  Patient presents with  . Emesis  . Diarrhea    (Consider location/radiation/quality/duration/timing/severity/associated sxs/prior treatment) HPI Comments: States when he was vomiting he felt short of breath however that resolved and the vomiting stopped. Denies worsening progressive shortness of breath or increased swelling in the lower extremities.  Patient is a 26 y.o. male presenting with vomiting and diarrhea. The history is provided by the patient.  Emesis  This is a new problem. The current episode started 1 to 2 hours ago. The problem occurs more than 10 times per day. The problem has not changed since onset.The emesis has an appearance of stomach contents. There has been no fever. Associated symptoms include chills and diarrhea. Pertinent negatives include no abdominal pain, no cough, no fever and no URI. Risk factors include suspect food intake (8 [employer] yesterday and the food didn't taste right).  Diarrhea The primary symptoms include vomiting and diarrhea. Primary symptoms do not include fever or abdominal pain. The illness began today. The onset was sudden.  The illness is also significant for chills.    Past Medical History  Diagnosis Date  . Anemia   . Nephrotic syndrome   . Hypertension 08/09/2011    Past Surgical History  Procedure Date  . Renal biopsy     No family history on file.  History  Substance Use Topics  . Smoking status: Passive Smoker    Types: Cigars  . Smokeless tobacco: Not on file  . Alcohol Use: Yes      Review of Systems  Constitutional: Positive for chills and appetite change. Negative for fever.  Respiratory: Negative for cough and shortness of breath.   Cardiovascular: Negative for leg swelling.  Gastrointestinal: Positive for vomiting and diarrhea. Negative for abdominal pain.   Intermittent abdominal cramps but no persistent abdominal pain    Allergies  Review of patient's allergies indicates no known allergies.  Home Medications   Current Outpatient Rx  Name Route Sig Dispense Refill  . PREDNISONE 20 MG PO TABS Oral Take 40 mg by mouth daily.    . ACETAMINOPHEN 325 MG PO TABS Oral Take 650 mg by mouth every 6 (six) hours as needed. pain     . FUROSEMIDE 40 MG PO TABS Oral Take 1 tablet (40 mg total) by mouth 2 (two) times daily. 60 tablet 0  . LISINOPRIL 2.5 MG PO TABS Oral Take 1 tablet (2.5 mg total) by mouth daily. 30 tablet 0  . POTASSIUM CHLORIDE CRYS ER 20 MEQ PO TBCR Oral Take 2 tablets (40 mEq total) by mouth daily. 60 tablet 0  . SIMVASTATIN 20 MG PO TABS Oral Take 1 tablet (20 mg total) by mouth daily at 6 PM. 30 tablet 0    BP 115/78  Pulse 94  Resp 20  Ht 5\' 10"  (1.778 m)  Wt 179 lb (81.194 kg)  BMI 25.68 kg/m2  SpO2 100%  Physical Exam  Nursing note and vitals reviewed. Constitutional: He is oriented to person, place, and time. He appears well-developed and well-nourished. He appears distressed.       Patient having rigors on exam  HENT:  Head: Normocephalic and atraumatic.  Mouth/Throat: Oropharynx is clear and moist.       Round moon face  Eyes: Conjunctivae and EOM are normal. Pupils are equal, round, and reactive  to light.  Neck: Normal range of motion. Neck supple.  Cardiovascular: Normal rate, regular rhythm and intact distal pulses.   No murmur heard. Pulmonary/Chest: Effort normal and breath sounds normal. No respiratory distress. He has no wheezes. He has no rales.  Abdominal: Soft. He exhibits no distension. There is no tenderness. There is no rebound and no guarding.  Musculoskeletal: Normal range of motion. He exhibits no edema and no tenderness.  Neurological: He is alert and oriented to person, place, and time.  Skin: Skin is warm and dry. No rash noted. No erythema.  Psychiatric: He has a normal mood and affect. His  behavior is normal.    ED Course  Procedures (including critical care time)  Labs Reviewed  CBC - Abnormal; Notable for the following:    WBC 14.1 (*)    All other components within normal limits  DIFFERENTIAL - Abnormal; Notable for the following:    Neutrophils Relative 85 (*)    Neutro Abs 12.1 (*)    Lymphocytes Relative 6 (*)    Monocytes Absolute 1.2 (*)    All other components within normal limits  COMPREHENSIVE METABOLIC PANEL - Abnormal; Notable for the following:    Potassium 3.4 (*)    CO2 33 (*)    Total Protein 5.4 (*)    Albumin 1.6 (*)    All other components within normal limits  URINALYSIS, ROUTINE W REFLEX MICROSCOPIC - Abnormal; Notable for the following:    Color, Urine AMBER (*) BIOCHEMICALS MAY BE AFFECTED BY COLOR   APPearance CLOUDY (*)    Specific Gravity, Urine 1.042 (*)    Glucose, UA 100 (*)    Hgb urine dipstick SMALL (*)    Protein, ur >300 (*)    All other components within normal limits  URINE MICROSCOPIC-ADD ON - Abnormal; Notable for the following:    Bacteria, UA MANY (*)    Casts HYALINE CASTS (*)    All other components within normal limits   No results found.   1. Vomiting and diarrhea       MDM   Pt with symptoms most consistent with a GI process with vomitting/diarrhea.  States that he had [employer] food yesterday and it didn't look right and tasted unusual.  Denies recent travel out of the country.  No recent abx.  No hx concerning for GU pathology or kidney stones.  However patient does have a history of nephrotic syndrome and is currently on prednisone and Lasix.  Pt is awake and alert on exam without peritoneal signs.  No sign of fluid overload currently. Will give gentle hydration, Zofran and check CBC CMP and UA. When the nausea improved will orally challenged.  12:10 PM Pt feeling better and tolerating po's.  UA with protein but no signs of infection.  CMP with normal Cr.  CBC with mild leukocytosis of 14,000.    2:25  PM On reevaluation patient states she vomited 2 more times and does not feel comfortable going home. Discussed with him that all of his labs are unremarkable and there is no indication for admission. Discussed observation protocol and he states he feels more comfortable staying here on the observation protocol. Will continue to treat his nausea and mild hydration.  [name], MD 11/27/11 1426

## 2011-11-27 NOTE — ED Notes (Signed)
MD at bedside. 

## 2011-11-27 NOTE — Discharge Instructions (Signed)
Diet for Diarrhea, Adult Having frequent, runny stools (diarrhea) has many causes. Diarrhea may be caused or worsened by food or drink. Diarrhea may be relieved by changing your diet. IF YOU ARE NOT TOLERATING SOLID FOODS:  Drink enough water and fluids to keep your urine clear or pale yellow.   Avoid sugary drinks and sodas as well as milk-based beverages.   Avoid beverages containing caffeine and alcohol.   You may try rehydrating beverages. You can make your own by following this recipe:    tsp table salt.    tsp baking soda.   ? tsp salt substitute (potassium chloride).   1 tbs + 1 tsp sugar.   1 qt water.  As your stools become more solid, you can start eating solid foods. Add foods one at a time. If a certain food causes your diarrhea to get worse, avoid that food and try other foods. A low fiber, low-fat, and lactose-free diet is recommended. Small, frequent meals may be better tolerated.  Starches  Allowed:  White, [place], and pita breads, plain rolls, buns, bagels. Plain muffins, matzo. Soda, saltine, or graham crackers. Pretzels, melba toast, zwieback. Cooked cereals made with water: cornmeal, farina, cream cereals. Dry cereals: refined corn, wheat, rice. Potatoes prepared any way without skins, refined macaroni, spaghetti, noodles, refined rice.   Avoid:  Bread, rolls, or crackers made with whole wheat, multi-grains, rye, bran seeds, nuts, or coconut. Corn tortillas or taco shells. Cereals containing whole grains, multi-grains, bran, coconut, nuts, or raisins. Cooked or dry oatmeal. Coarse wheat cereals, granola. Cereals advertised as "high-fiber." Potato skins. Whole grain pasta, wild or brown rice. Popcorn. Sweet potatoes/yams. Sweet rolls, doughnuts, waffles, pancakes, sweet breads.  Vegetables  Allowed: Strained tomato and vegetable juices. Most well-cooked and canned vegetables without seeds. Fresh: Tender lettuce, cucumber without the skin, cabbage, spinach, bean  sprouts.   Avoid: Fresh, cooked, or canned: Artichokes, baked beans, beet greens, broccoli, Brussels sprouts, corn, kale, legumes, peas, sweet potatoes. Cooked: Green or red cabbage, spinach. Avoid large servings of any vegetables, because vegetables shrink when cooked, and they contain more fiber per serving than fresh vegetables.  Fruit  Allowed: All fruit juices except prune juice. Cooked or canned: Apricots, applesauce, cantaloupe, cherries, fruit cocktail, grapefruit, grapes, kiwi, mandarin oranges, peaches, pears, plums, watermelon. Fresh: Apples without skin, ripe banana, grapes, cantaloupe, cherries, grapefruit, peaches, oranges, plums. Keep servings limited to  cup or 1 piece.   Avoid: Fresh: Apple with skin, apricots, mango, pears, raspberries, strawberries. Prune juice, stewed or dried prunes. Dried fruits, raisins, dates. Large servings of all fresh fruits.  Meat and Meat Substitutes  Allowed: Ground or well-cooked tender beef, ham, veal, lamb, pork, or poultry. Eggs, plain cheese. Fish, oysters, shrimp, lobster, other seafoods. Liver, organ meats.   Avoid: Tough, fibrous meats with gristle. Peanut butter, smooth or chunky. Cheese, nuts, seeds, legumes, dried peas, beans, lentils.  Milk  Allowed: Yogurt, lactose-free milk, kefir, drinkable yogurt, buttermilk, soy milk.   Avoid: Milk, chocolate milk, beverages made with milk, such as milk shakes.  Soups  Allowed: Bouillon, broth, or soups made from allowed foods. Any strained soup.   Avoid: Soups made from vegetables that are not allowed, cream or milk-based soups.  Desserts and Sweets  Allowed: Sugar-free gelatin, sugar-free frozen ice pops made without sugar alcohol.   Avoid: Plain cakes and cookies, pie made with allowed fruit, pudding, custard, cream pie. Gelatin, fruit, ice, sherbet, frozen ice pops. Ice cream, ice milk without nuts. Plain hard candy,  honey, jelly, molasses, syrup, sugar, chocolate syrup, gumdrops,  marshmallows.  Fats and Oils  Allowed: Avoid any fats and oils.   Avoid: Seeds, nuts, olives, avocados. Margarine, butter, cream, mayonnaise, salad oils, plain salad dressings made from allowed foods. Plain gravy, crisp bacon without rind.  Beverages  Allowed: Water, decaffeinated teas, oral rehydration solutions, sugar-free beverages.   Avoid: Fruit juices, caffeinated beverages (coffee, tea, soda or pop), alcohol, sports drinks, or lemon-lime soda or pop.  Condiments  Allowed: Ketchup, mustard, horseradish, vinegar, cream sauce, cheese sauce, cocoa powder. Spices in moderation: allspice, basil, bay leaves, celery powder or leaves, cinnamon, cumin powder, curry powder, ginger, mace, marjoram, onion or garlic powder, oregano, paprika, parsley flakes, ground pepper, rosemary, sage, savory, tarragon, thyme, turmeric.   Avoid: Coconut, honey.  Weight Monitoring: Weigh yourself every day. You should weigh yourself in the morning after you urinate and before you eat breakfast. Wear the same amount of clothing when you weigh yourself. Record your weight daily. Bring your recorded weights to your clinic visits. Tell your caregiver right away if you have gained 3 lb/1.4 kg or more in 1 day, 5 lb/2.3 kg in a week, or whatever amount you were told to report. SEEK IMMEDIATE MEDICAL CARE IF:   You are unable to keep fluids down.   You start to throw up (vomit) or diarrhea keeps coming back (persistent).   Abdominal pain develops, increases, or can be felt in one place (localizes).   You have an oral temperature above 102 F (38.9 C), not controlled by medicine.   Diarrhea contains blood or mucus.   You develop excessive weakness, dizziness, fainting, or extreme thirst.  MAKE SURE YOU:   Understand these instructions.   Will watch your condition.   Will get help right away if you are not doing well or get worse.  Document Released: 12/06/2003 Document Revised: 05/28/2011 Document Reviewed:  03/29/2009 [facility] Patient Information 2012 [place].

## 2011-11-27 NOTE — ED Notes (Signed)
Patient states he ate at [facility] last night.  Woke up this am with nausea, vomiting and diarrhea.  States he has kidney problems.  Patient affect is very uncooperative.  States he has intermittent diffuse abdominal pain.

## 2011-11-27 NOTE — ED Notes (Signed)
Patient sitting up in the bed, drinking water w/o any c/o nausea.  Patient is requesting solid foods to eat and is requesting his friends to bring him food.  Explained orders for clear liquid diet, verbalized understanding.  Patient is alert and talkative in nad.

## 2011-11-27 NOTE — ED Notes (Signed)
IV fluids infusing without diff, IV site unremarkable. Pt given italian ice and ginger ale per pt request. NAD noted.

## 2012-02-23 ENCOUNTER — Emergency Department (HOSPITAL_COMMUNITY)
Admission: EM | Admit: 2012-02-23 | Discharge: 2012-02-23 | Disposition: A | Discharge status: Home / Self Care | Payer: Self-pay | Attending: Emergency Medicine | Admitting: Emergency Medicine

## 2012-02-23 ENCOUNTER — Emergency Department (HOSPITAL_COMMUNITY): Payer: Self-pay

## 2012-02-23 ENCOUNTER — Encounter (HOSPITAL_COMMUNITY): Payer: Self-pay | Admitting: Emergency Medicine

## 2012-02-23 DIAGNOSIS — J309 Allergic rhinitis, unspecified: Secondary | ICD-10-CM | POA: Insufficient documentation

## 2012-02-23 DIAGNOSIS — R05 Cough: Secondary | ICD-10-CM | POA: Insufficient documentation

## 2012-02-23 DIAGNOSIS — H5789 Other specified disorders of eye and adnexa: Secondary | ICD-10-CM | POA: Insufficient documentation

## 2012-02-23 DIAGNOSIS — I10 Essential (primary) hypertension: Secondary | ICD-10-CM | POA: Insufficient documentation

## 2012-02-23 DIAGNOSIS — J3489 Other specified disorders of nose and nasal sinuses: Secondary | ICD-10-CM | POA: Insufficient documentation

## 2012-02-23 DIAGNOSIS — R059 Cough, unspecified: Secondary | ICD-10-CM | POA: Insufficient documentation

## 2012-02-23 DIAGNOSIS — Z9109 Other allergy status, other than to drugs and biological substances: Secondary | ICD-10-CM

## 2012-02-23 DIAGNOSIS — J069 Acute upper respiratory infection, unspecified: Secondary | ICD-10-CM | POA: Insufficient documentation

## 2012-02-23 NOTE — Discharge Instructions (Signed)
Allergies, Generic Allergies may happen from anything your body is sensitive to. This may be food, medicines, pollens, chemicals, and nearly anything around you in everyday life that produces allergens. An allergen is anything that causes an allergy producing substance. Heredity is often a factor in causing these problems. This means you may have some of the same allergies as your parents. Food allergies happen in all age groups. Food allergies are some of the most severe and life threatening. Some common food allergies are cow's milk, seafood, eggs, nuts, wheat, and soybeans. SYMPTOMS   Swelling around the mouth.   An itchy red rash or hives.   Vomiting or diarrhea.   Difficulty breathing.  SEVERE ALLERGIC REACTIONS ARE LIFE-THREATENING. This reaction is called anaphylaxis. It can cause the mouth and throat to swell and cause difficulty with breathing and swallowing. In severe reactions only a trace amount of food (for example, peanut oil in a salad) may cause death within seconds. Seasonal allergies occur in all age groups. These are seasonal because they usually occur during the same season every year. They may be a reaction to molds, grass pollens, or tree pollens. Other causes of problems are house dust mite allergens, pet dander, and mold spores. The symptoms often consist of nasal congestion, a runny itchy nose associated with sneezing, and tearing itchy eyes. There is often an associated itching of the mouth and ears. The problems happen when you come in contact with pollens and other allergens. Allergens are the particles in the air that the body reacts to with an allergic reaction. This causes you to release allergic antibodies. Through a chain of events, these eventually cause you to release histamine into the blood stream. Although it is meant to be protective to the body, it is this release that causes your discomfort. This is why you were given anti-histamines to feel better. If you are  unable to pinpoint the offending allergen, it may be determined by skin or blood testing. Allergies cannot be cured but can be controlled with medicine. Hay fever is a collection of all or some of the seasonal allergy problems. It may often be treated with simple over-the-counter medicine such as diphenhydramine. Take medicine as directed. Do not drink alcohol or drive while taking this medicine. Check with your caregiver or package insert for child dosages. If these medicines are not effective, there are many new medicines your caregiver can prescribe. Stronger medicine such as nasal spray, eye drops, and corticosteroids may be used if the first things you try do not work well. Other treatments such as immunotherapy or desensitizing injections can be used if all else fails. Follow up with your caregiver if problems continue. These seasonal allergies are usually not life threatening. They are generally more of a nuisance that can often be handled using medicine. HOME CARE INSTRUCTIONS   If unsure what causes a reaction, keep a diary of foods eaten and symptoms that follow. Avoid foods that cause reactions.   If hives or rash are present:   Take medicine as directed.   You may use an over-the-counter antihistamine (diphenhydramine) for hives and itching as needed.   Apply cold compresses (cloths) to the skin or take baths in cool water. Avoid hot baths or showers. Heat will make a rash and itching worse.   If you are severely allergic:   Following a treatment for a severe reaction, hospitalization is often required for closer follow-up.   Wear a medic-alert bracelet or necklace stating the allergy.  You and your family must learn how to give adrenaline or use an anaphylaxis kit.   If you have had a severe reaction, always carry your anaphylaxis kit or EpiPen with you. Use this medicine as directed by your caregiver if a severe reaction is occurring. Failure to do so could have a fatal  outcome.  SEEK MEDICAL CARE IF:  You suspect a food allergy. Symptoms generally happen within 30 minutes of eating a food.   Your symptoms have not gone away within 2 days or are getting worse.   You develop new symptoms.   You want to retest yourself or your child with a food or drink you think causes an allergic reaction. Never do this if an anaphylactic reaction to that food or drink has happened before. Only do this under the care of a caregiver.  SEEK IMMEDIATE MEDICAL CARE IF:   You have difficulty breathing, are wheezing, or have a tight feeling in your chest or throat.   You have a swollen mouth, or you have hives, swelling, or itching all over your body.   You have had a severe reaction that has responded to your anaphylaxis kit or an EpiPen. These reactions may return when the medicine has worn off. These reactions should be considered life threatening.  MAKE SURE YOU:   Understand these instructions.   Will watch your condition.   Will get help right away if you are not doing well or get worse.  Document Released: 12/09/2002 Document Revised: 09/04/2011 Document Reviewed: 05/15/2008 [facility] Patient Information 2012 [place].  Take over-the-counter Zyrtec and lubricated eye drops to help with your symptoms. Followup with your doctor if this is not improving.

## 2012-02-23 NOTE — ED Provider Notes (Addendum)
History   This chart was scribed for Christopher Dessert, MD by Christopher Wang. The patient was seen in room STRE5/STRE5. Patient's care was started at 1417.    CSN: [id number]  Arrival date & time 02/23/12  1417   First MD Initiated Contact with Patient 02/23/12 1449      Chief Complaint  Patient presents with  . URI    (Consider location/radiation/quality/duration/timing/severity/associated sxs/prior treatment) HPI  Christopher Wang is a 26 y.o. male who presents to the Emergency Department complaining of moderate cough onset several days with associated rhinorrhea, red eyes, drainage from the eyes. Pt took dayquill without relief also says he has bad allergies. Kidneys were last check on his nephrotic syndrome one week ago. Pt says he may have fever. Denies eyes itching, SOB.    Past Medical History  Diagnosis Date  . Anemia   . Nephrotic syndrome   . Hypertension 08/09/2011    Past Surgical History  Procedure Date  . Renal biopsy     History reviewed. No pertinent family history.  History  Substance Use Topics  . Smoking status: Passive Smoker    Types: Cigars  . Smokeless tobacco: Not on file  . Alcohol Use: Yes      Review of Systems  HENT: Positive for rhinorrhea.   Respiratory: Positive for cough.   All other systems reviewed and are negative.    Allergies  Review of patient's allergies indicates no known allergies.  Home Medications   Current Outpatient Rx  Name Route Sig Dispense Refill  . FUROSEMIDE 40 MG PO TABS Oral Take 40 mg by mouth 2 (two) times daily.    Marland Kitchen LISINOPRIL 2.5 MG PO TABS Oral Take 2.5 mg by mouth daily.    Marland Kitchen POTASSIUM CHLORIDE CRYS ER 20 MEQ PO TBCR Oral Take 20 mEq by mouth daily.    Marland Kitchen PREDNISONE 20 MG PO TABS Oral Take 40 mg by mouth daily.    Marland Kitchen PRESCRIPTION MEDICATION Subcutaneous Inject 1 mL into the skin 2 (two) times a week. Achtar injection    . DAYQUIL PO Oral Take 30 mLs by mouth 2 (two) times daily as needed. For  cough/congestion      BP 133/97  Pulse 91  Temp(Src) 98.7 F (37.1 C) (Oral)  Resp 20  SpO2 98%  Physical Exam  Nursing note and vitals reviewed. Constitutional: He is oriented to person, place, and time. He appears well-developed and well-nourished. No distress.  HENT:  Head: Normocephalic and atraumatic.       Nasal mucosa swollen with erythema, Ears normal lungs normal, Throat normal.    Eyes: EOM are normal.       Bilaterally conjunctival injection without exudate  Neck: Neck supple. No tracheal deviation present.  Cardiovascular: Normal rate.   Pulmonary/Chest: Effort normal. No respiratory distress.  Musculoskeletal: Normal range of motion.  Neurological: He is alert and oriented to person, place, and time.  Skin: Skin is warm and dry.  Psychiatric: He has a normal mood and affect. His behavior is normal.    ED Course  Procedures (including critical care time) Pt seen at 1455 to have chest x-ray.   Labs Reviewed - No data to display Dg Chest 2 View  02/23/2012  *RADIOLOGY REPORT*  Clinical Data: Chronic cough  CHEST - 2 VIEW  Comparison: 08/06/2011.  Findings: Lungs are clear.  No pleural effusion or pneumothorax.  Cardiomediastinal silhouette is within normal limits.  Visualized osseous structures are within normal limits.  IMPRESSION:  No evidence of acute cardiopulmonary disease.  Original Report Authenticated By: Julian Hy, M.D.     1. Environmental allergies   2. URI (upper respiratory infection)       MDM   Patient with symptoms most consistent with allergies. He has allergic conjunctivitis with rhinorrhea and dry cough. He denies any fever or recent sick contacts. His lungs were clear and chest x-ray is within normal limits. Patient does have a history of nephrotic syndrome however this is being managed by a nephrologist and he gets routine checks of his renal function and his medications. He states everything has been fine and his urine output and  color has not changed. Do not feel at this time that that has anything to do with reason he is here today will not look further into this.      I personally performed the services described in this documentation, which was scribed in my presence.  The recorded information has been reviewed and considered.    Christopher Dessert, MD 02/23/12 1538  Christopher Dessert, MD 02/23/12 1548 n  Christopher Dessert, MD 02/23/12 1550

## 2012-02-23 NOTE — ED Notes (Signed)
Pt c/o cold and cough with URI x  Several days; pt also c/o allergy sx

## 2012-09-24 IMAGING — US US BIOPSY
1 series · 11 of 11 positions shown · non-contrast
Comparison: none

CLINICAL HISTORY: 25-year-old with nephrotic syndrome.

[Series 1: us biopsy · 0.26mm/px · 11 of 11 slices shown]
[im 1/11]
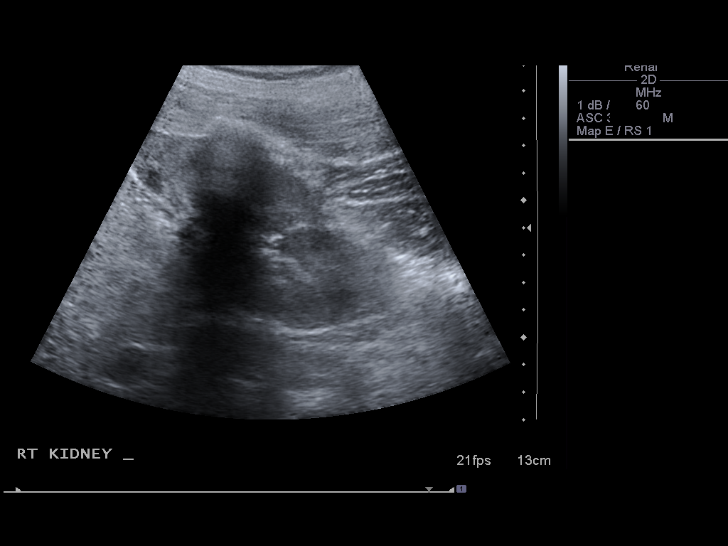
[im 2/11]
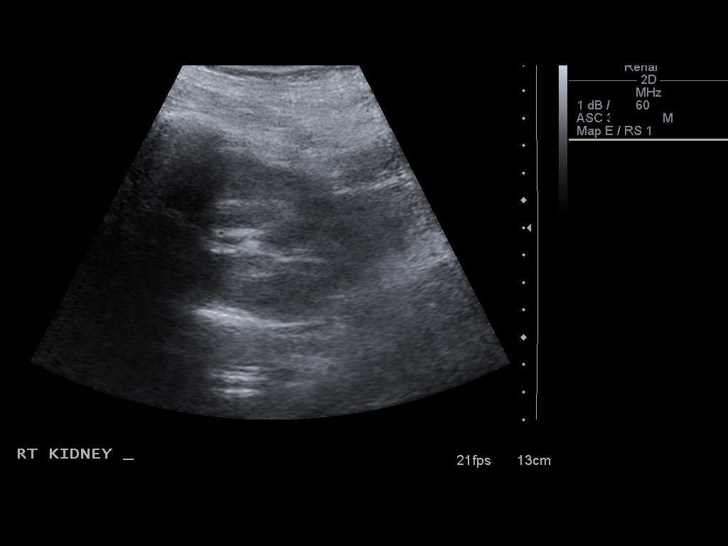
[im 3/11]
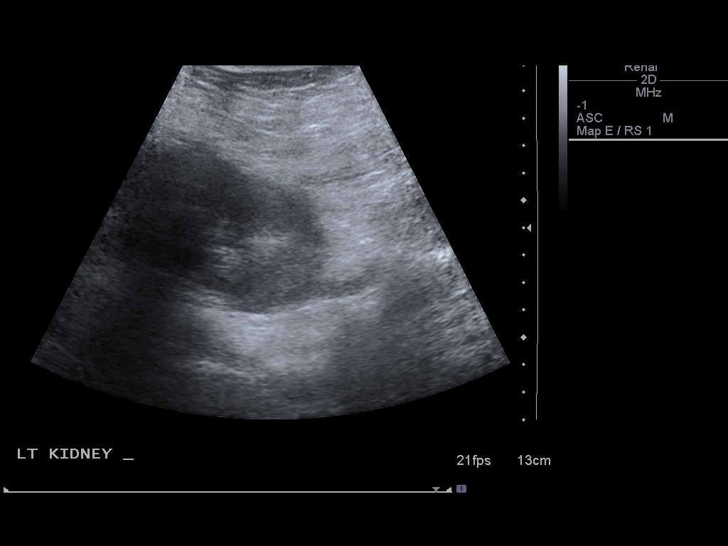
[im 4/11]
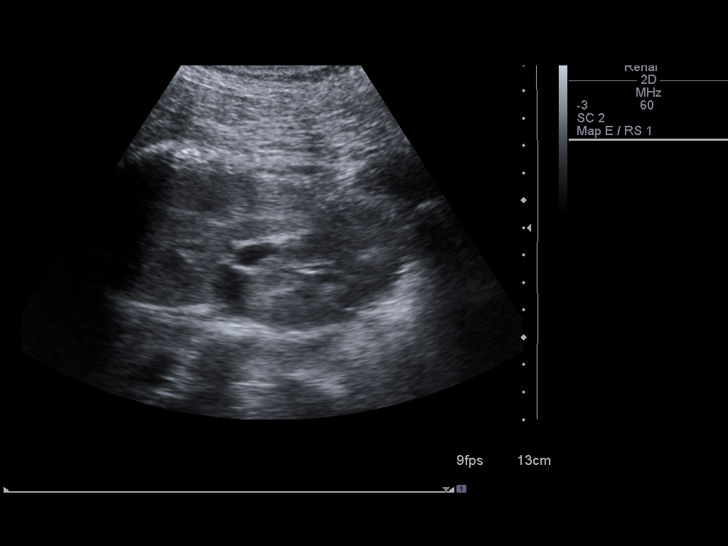
[im 5/11]
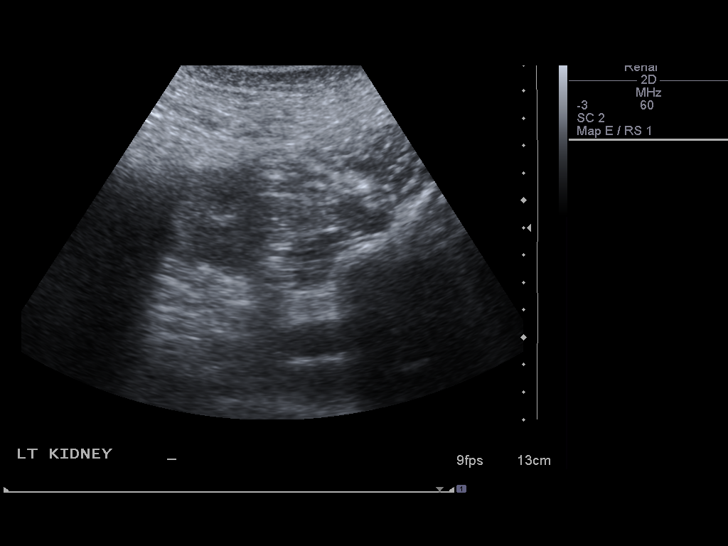
[im 6/11]
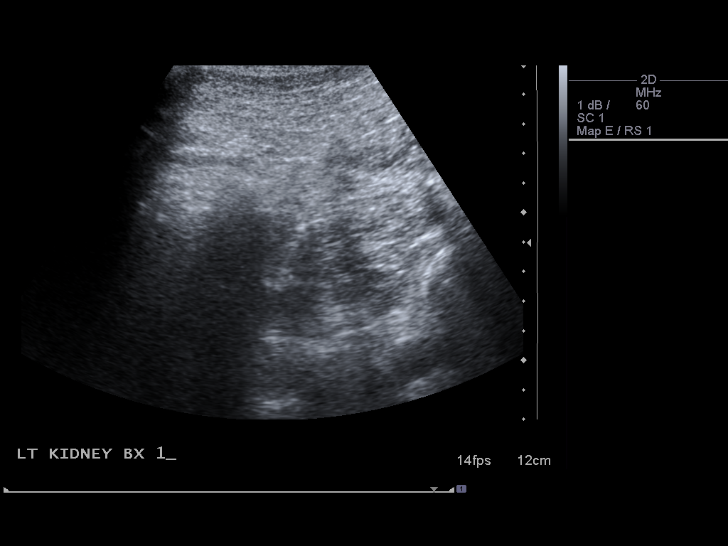
[im 7/11]
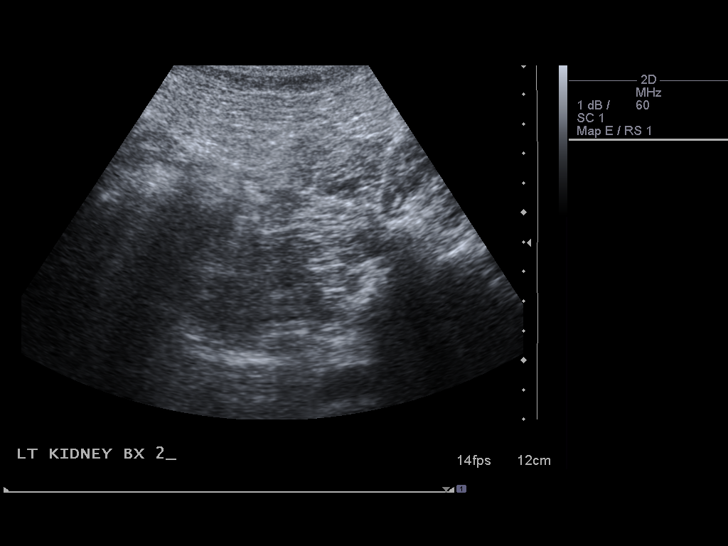
[im 8/11]
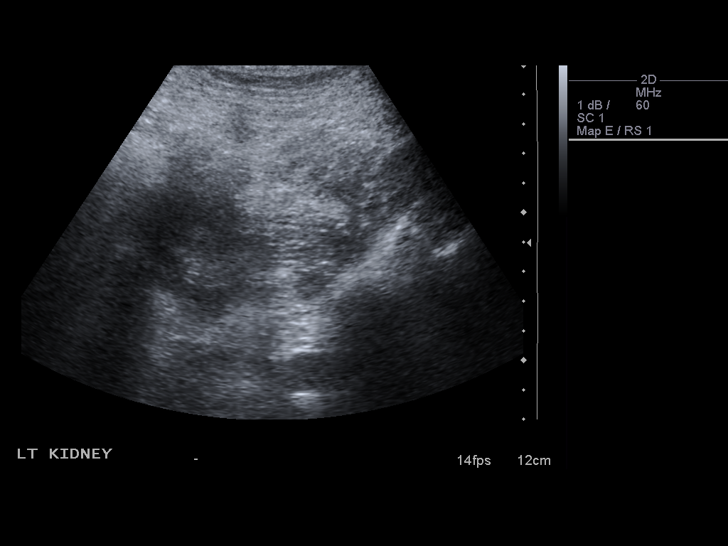
[im 9/11]
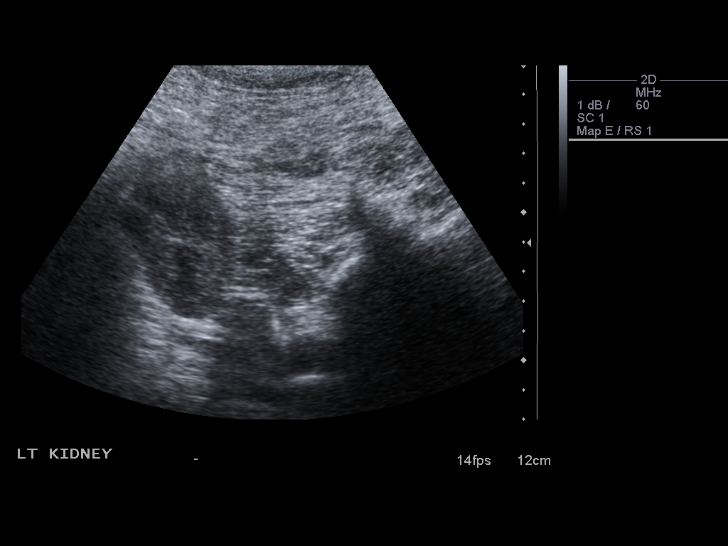
[im 10/11]
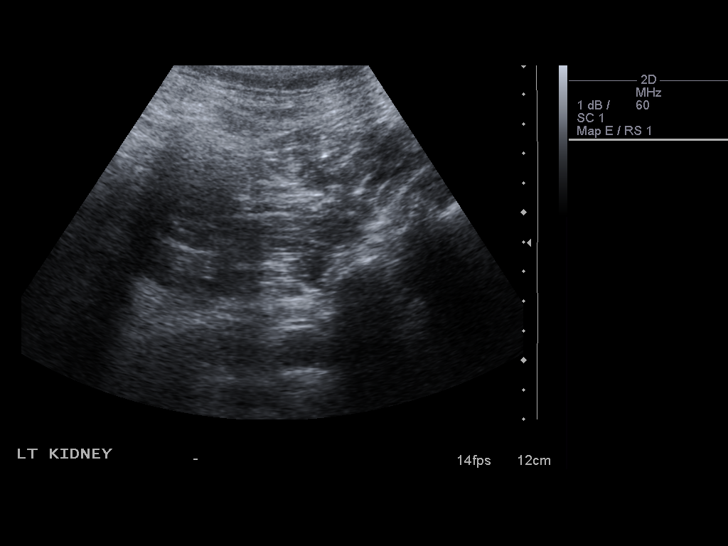
[im 11/11]
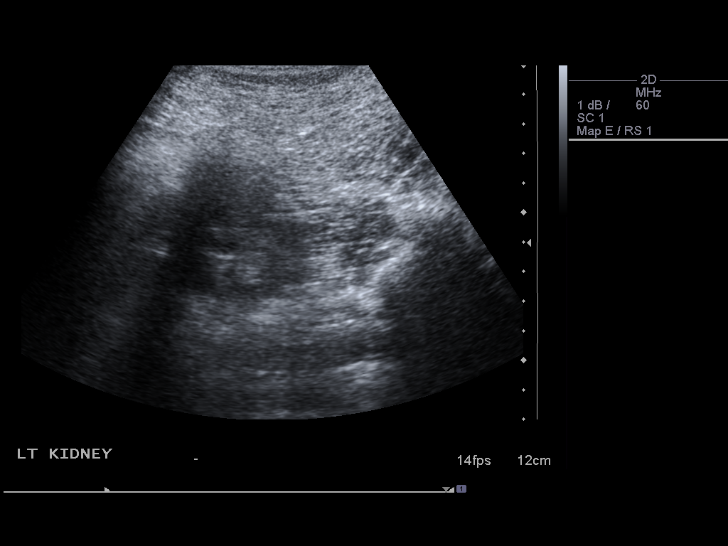

[11 of 11 positions shown; findings below may reference images not displayed]

PROCEDURE(S): ULTRASOUND GUIDED RENAL BIOPSY

Medications:Versed 4 mg, Fentanyl 200 mcg. A radiology nurse
monitored the patient for moderate sedation.

Moderate sedation time:25 minutes

Fluoroscopy time: None

Procedure:The procedure was explained to the patient.  The risks
and benefits of the procedure were discussed and the patient's
questions were addressed.  Informed consent was obtained from the
patient.  The kidneys were evaluated with ultrasound.  The left
kidney was felt to be most accessible for percutaneous biopsy.  The
left flank was prepped and draped in a sterile fashion.  The skin
was anesthetized with 1% lidocaine.  A 16 gauge core biopsy needle
was directed into the lower pole of the right kidney.  Needle was
directed from a medial to lateral approach.  Two core biopsies were
obtained using real time ultrasound guidance.  Samples placed in
saline.
FINDINGS: Both kidneys were identified with ultrasound.  There is
mild fullness in the right kidney without frank hydronephrosis.  No
evidence for left hydronephrosis.  Core needle was identified
within the left kidney lower pole.

Complications: None
IMPRESSION: Ultrasound guided biopsy of the left kidney.

## 2013-05-30 ENCOUNTER — Emergency Department (HOSPITAL_COMMUNITY)
Admission: EM | Admit: 2013-05-30 | Discharge: 2013-05-31 | Disposition: A | Discharge status: Home / Self Care | Payer: Medicaid Other | Attending: Emergency Medicine | Admitting: Emergency Medicine

## 2013-05-30 ENCOUNTER — Encounter (HOSPITAL_COMMUNITY): Payer: Self-pay | Admitting: Emergency Medicine

## 2013-05-30 DIAGNOSIS — Z862 Personal history of diseases of the blood and blood-forming organs and certain disorders involving the immune mechanism: Secondary | ICD-10-CM | POA: Insufficient documentation

## 2013-05-30 DIAGNOSIS — R110 Nausea: Secondary | ICD-10-CM | POA: Insufficient documentation

## 2013-05-30 DIAGNOSIS — Z87448 Personal history of other diseases of urinary system: Secondary | ICD-10-CM | POA: Insufficient documentation

## 2013-05-30 DIAGNOSIS — I10 Essential (primary) hypertension: Secondary | ICD-10-CM | POA: Insufficient documentation

## 2013-05-30 DIAGNOSIS — G43909 Migraine, unspecified, not intractable, without status migrainosus: Secondary | ICD-10-CM

## 2013-05-30 NOTE — ED Notes (Signed)
Pt arrived to the ED with a complaint of a headache.  Pt states he has had a headache for about a day.  Pt states he is on kidney medication to correct a protein in his urine problem but has been non compliant with his medication.

## 2013-05-31 MED ORDER — METOCLOPRAMIDE HCL 5 MG/ML IJ SOLN
5.0000 mg | Freq: Once | INTRAMUSCULAR | Status: AC
  Administered 2013-05-31: 5 mg via INTRAVENOUS
  Filled 2013-05-31: qty 2

## 2013-05-31 MED ORDER — DIPHENHYDRAMINE HCL 50 MG/ML IJ SOLN
25.0000 mg | Freq: Once | INTRAMUSCULAR | Status: AC
Start: 2013-05-31 — End: 2013-05-31
  Administered 2013-05-31: 25 mg via INTRAVENOUS
  Filled 2013-05-31: qty 1

## 2013-05-31 MED ORDER — SODIUM CHLORIDE 0.9 % IV BOLUS (SEPSIS)
1000.0000 mL | Freq: Once | INTRAVENOUS | Status: AC
  Administered 2013-05-31: 1000 mL via INTRAVENOUS

## 2013-05-31 NOTE — ED Provider Notes (Signed)
CSN: [id number]     Arrival date & time 05/30/13  2114 History   First MD Initiated Contact with Patient 05/30/13 2335     Chief Complaint  Patient presents with  . Headache   (Consider location/radiation/quality/duration/timing/severity/associated sxs/prior Treatment) HPI This is a 27 year old male with history of hypertension and nephrotic syndrome who presents with headache. Patient reports headache since 2 PM. He reports he has a history of migraines. He states he "took some pills." But that they did not help. He is not sure what pills he took. He subsequently took Tylenol without any relief. He endorses a frontal headache.  He endorses photosensitivity and nausea. He denies any vomiting, diarrhea, abdominal pain, chest pain, shortness of breath, urinary symptoms. Past Medical History  Diagnosis Date  . Anemia   . Nephrotic syndrome   . Hypertension 08/09/2011   Past Surgical History  Procedure Laterality Date  . Renal biopsy     History reviewed. No pertinent family history. History  Substance Use Topics  . Smoking status: Passive Smoke Exposure - Never Smoker    Types: Cigars  . Smokeless tobacco: Not on file  . Alcohol Use: Yes    Review of Systems  Constitutional: Negative.  Negative for fever.  Respiratory: Negative.  Negative for chest tightness and shortness of breath.   Cardiovascular: Negative.  Negative for chest pain.  Gastrointestinal: Positive for nausea. Negative for vomiting and abdominal pain.  Genitourinary: Negative.  Negative for dysuria.  Neurological: Positive for headaches. Negative for dizziness and weakness.  All other systems reviewed and are negative.    Allergies  Review of patient's allergies indicates no known allergies.  Home Medications   Current Outpatient Rx  Name  Route  Sig  Dispense  Refill  . acetaminophen (TYLENOL) 500 MG tablet   Oral   Take 500 mg by mouth every 6 (six) hours as needed for pain (headache).         Marland Kitchen  ibuprofen (ADVIL,MOTRIN) 200 MG tablet   Oral   Take 200 mg by mouth every 6 (six) hours as needed for pain (headache).          BP 156/99  Pulse 76  Temp(Src) 98.7 F (37.1 C) (Oral)  Resp 20  Ht 6' (1.829 m)  Wt 179 lb (81.194 kg)  BMI 24.27 kg/m2  SpO2 100% Physical Exam  Nursing note and vitals reviewed. Constitutional: He is oriented to person, place, and time. He appears well-developed and well-nourished. No distress.  HENT:  Head: Normocephalic and atraumatic.  Eyes: Pupils are equal, round, and reactive to light.  Neck: Normal range of motion. Neck supple.  Cardiovascular: Normal rate, regular rhythm and normal heart sounds.   No murmur heard. Pulmonary/Chest: Effort normal and breath sounds normal. No respiratory distress.  Abdominal: Soft. Bowel sounds are normal. There is no tenderness. There is no rebound.  Musculoskeletal: He exhibits no edema.  Lymphadenopathy:    He has no cervical adenopathy.  Neurological: He is alert and oriented to person, place, and time. He has normal reflexes.  Normal strength in all 4 extremities, no dysmetria noted  Skin: Skin is warm and dry.  Psychiatric: He has a normal mood and affect.    ED Course  Procedures (including critical care time) Labs Review Labs Reviewed - No data to display Imaging Review No results found.  MDM   1. Migraine    This is a 27 year old male who presents with headache. He has no neurologic deficit is nontoxic-appearing  on exam. Patient was given normal saline bolus, Reglan, and Benadryl.  Patient had improvement of his symptoms while in the ER. This no other workup is necessary. He has had no fever and I have low suspicion for meningitis as a cause for the headache.  After history, exam, and medical workup I feel the patient has been appropriately medically screened and is safe for discharge home. Pertinent diagnoses were discussed with the patient. Patient was given return  precautions.    Merryl Hacker, MD 05/31/13 [phone]

## 2013-06-20 ENCOUNTER — Emergency Department (HOSPITAL_COMMUNITY)
Admission: EM | Admit: 2013-06-20 | Discharge: 2013-06-20 | Disposition: A | Discharge status: Home / Self Care | Payer: Medicaid Other | Attending: Emergency Medicine | Admitting: Emergency Medicine

## 2013-06-20 ENCOUNTER — Encounter (HOSPITAL_COMMUNITY): Payer: Self-pay | Admitting: Emergency Medicine

## 2013-06-20 DIAGNOSIS — R519 Headache, unspecified: Secondary | ICD-10-CM

## 2013-06-20 DIAGNOSIS — Y9389 Activity, other specified: Secondary | ICD-10-CM | POA: Insufficient documentation

## 2013-06-20 DIAGNOSIS — Y92410 Unspecified street and highway as the place of occurrence of the external cause: Secondary | ICD-10-CM | POA: Insufficient documentation

## 2013-06-20 DIAGNOSIS — I10 Essential (primary) hypertension: Secondary | ICD-10-CM

## 2013-06-20 DIAGNOSIS — S0993XA Unspecified injury of face, initial encounter: Secondary | ICD-10-CM | POA: Insufficient documentation

## 2013-06-20 DIAGNOSIS — Z87448 Personal history of other diseases of urinary system: Secondary | ICD-10-CM | POA: Insufficient documentation

## 2013-06-20 DIAGNOSIS — Z862 Personal history of diseases of the blood and blood-forming organs and certain disorders involving the immune mechanism: Secondary | ICD-10-CM | POA: Insufficient documentation

## 2013-06-20 DIAGNOSIS — S0990XA Unspecified injury of head, initial encounter: Secondary | ICD-10-CM | POA: Insufficient documentation

## 2013-06-20 MED ORDER — DEXAMETHASONE SODIUM PHOSPHATE 10 MG/ML IJ SOLN
10.0000 mg | Freq: Once | INTRAMUSCULAR | Status: AC
  Administered 2013-06-20: 10 mg via INTRAMUSCULAR
  Filled 2013-06-20: qty 1

## 2013-06-20 MED ORDER — KETOROLAC TROMETHAMINE 30 MG/ML IJ SOLN
30.0000 mg | Freq: Once | INTRAMUSCULAR | Status: AC
  Administered 2013-06-20: 30 mg via INTRAMUSCULAR
  Filled 2013-06-20: qty 1

## 2013-06-20 MED ORDER — METOCLOPRAMIDE HCL 5 MG/ML IJ SOLN
10.0000 mg | Freq: Once | INTRAMUSCULAR | Status: AC
  Administered 2013-06-20: 10 mg via INTRAMUSCULAR
  Filled 2013-06-20: qty 2
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
## 2013-06-21 NOTE — ED Provider Notes (Signed)
Medical screening examination/treatment/procedure(s) were performed by non-physician practitioner and as supervising physician I was immediately available for consultation/collaboration.  Merryl Hacker, MD 06/21/13 [phone]

## 2013-07-07 ENCOUNTER — Other Ambulatory Visit: Payer: Self-pay | Admitting: *Deleted

## 2013-07-07 ENCOUNTER — Encounter: Payer: Self-pay | Admitting: Vascular Surgery

## 2013-07-07 DIAGNOSIS — Z01810 Encounter for preprocedural cardiovascular examination: Secondary | ICD-10-CM

## 2013-07-07 DIAGNOSIS — N184 Chronic kidney disease, stage 4 (severe): Secondary | ICD-10-CM

## 2013-07-08 ENCOUNTER — Ambulatory Visit (INDEPENDENT_AMBULATORY_CARE_PROVIDER_SITE_OTHER): Payer: Self-pay | Admitting: Vascular Surgery

## 2013-07-08 ENCOUNTER — Ambulatory Visit (HOSPITAL_COMMUNITY)
Admission: RE | Admit: 2013-07-08 | Discharge: 2013-07-08 | Disposition: A | Discharge status: Home / Self Care | Payer: Medicaid Other | Source: Ambulatory Visit | Attending: Vascular Surgery | Admitting: Vascular Surgery

## 2013-07-08 ENCOUNTER — Ambulatory Visit (INDEPENDENT_AMBULATORY_CARE_PROVIDER_SITE_OTHER)
Admission: RE | Admit: 2013-07-08 | Discharge: 2013-07-08 | Disposition: A | Discharge status: Home / Self Care | Payer: Medicaid Other | Source: Ambulatory Visit | Attending: Vascular Surgery | Admitting: Vascular Surgery

## 2013-07-08 ENCOUNTER — Encounter: Payer: Self-pay | Admitting: Vascular Surgery

## 2013-07-08 VITALS — BP 167/117 | HR 79 | Ht 72.0 in | Wt 175.8 lb

## 2013-07-08 DIAGNOSIS — N184 Chronic kidney disease, stage 4 (severe): Secondary | ICD-10-CM | POA: Insufficient documentation

## 2013-07-08 DIAGNOSIS — N186 End stage renal disease: Secondary | ICD-10-CM

## 2013-07-08 DIAGNOSIS — Z01810 Encounter for preprocedural cardiovascular examination: Secondary | ICD-10-CM

## 2013-07-08 NOTE — Progress Notes (Signed)
[facility]  Referred by:  Lucrezia Starch, MD [address], [place] [address]  Reason for referral: New access  History of Present Illness  Christopher Wang is a 27 y.o. ([birth year]) male who presents for evaluation for permanent access.  The patient is right hand dominant.  The patient has not had previous access procedures.  Previous central venous cannulation procedures include: none.  The patient has never had a PPM placed.   Past Medical History  Diagnosis Date  . Anemia   . Nephrotic syndrome   . Hypertension 08/09/2011    Past Surgical History  Procedure Laterality Date  . Renal biopsy      History   Social History  . Marital Status: Single    Spouse Name: N/A    Number of Children: N/A  . Years of Education: N/A   Occupational History  . Not on file.   Social History Main Topics  . Smoking status: Passive Smoke Exposure - Never Smoker    Types: Cigars  . Smokeless tobacco: Never Used  . Alcohol Use: 0.6 oz/week    1 Shots of liquor per week  . Drug Use: No  . Sexual Activity: Not Currently    Birth Control/ Protection: None   Other Topics Concern  . Not on file   Social History Narrative  . No narrative on file    Family History  Problem Relation Age of Onset  . Hypertension Mother     Current Outpatient Prescriptions  Medication Sig Dispense Refill  . amLODipine (NORVASC) 10 MG tablet Take 10 mg by mouth daily.       No current facility-administered medications for this visit.   No Known Allergies  REVIEW OF SYSTEMS:  (Positives checked otherwise negative)  CARDIOVASCULAR:  []  chest pain, []  chest pressure, []  palpitations, []  shortness of breath when laying flat, []  shortness of breath with exertion,  []  pain in feet when walking, []  pain in feet when laying flat, []  history of blood clot in veins (DVT), []  history of phlebitis, []  swelling in legs, []  varicose  veins  PULMONARY:  []  productive cough, []  asthma, []  wheezing  NEUROLOGIC:  []  weakness in arms or legs, []  numbness in arms or legs, []  difficulty speaking or slurred speech, []  temporary loss of vision in one eye, []  dizziness  HEMATOLOGIC:  []  bleeding problems, []  problems with blood clotting too easily  MUSCULOSKEL:  []  joint pain, []  joint swelling  GASTROINTEST:  []  vomiting blood, []  blood in stool     GENITOURINARY:  []  burning with urination, []  blood in urine  PSYCHIATRIC:  []  history of major depression  INTEGUMENTARY:  []  rashes, []  ulcers  CONSTITUTIONAL:  [x]  fever, []  chills   Physical Examination  Filed Vitals:   07/08/13 1049  BP: 167/117  Pulse: 79  Height: 6' (1.829 m)  Weight: 175 lb 12.8 oz (79.742 kg)  SpO2: 100%   Body mass index is 23.84 kg/(m^2).  General: A&O x 3, WD, WN  Head: [place]/AT  Ear/Nose/Throat: Hearing grossly intact, nares w/o erythema or drainage, oropharynx w/o Erythema/Exudate, Mallampati score: 2  Eyes: PERRLA, EOMI  Neck: Supple, no nuchal rigidity, no palpable LAD  Pulmonary: Sym exp, good air movt, CTAB, no rales, rhonchi, & wheezing  Cardiac: RRR, Nl S1, S2, no Murmurs, rubs or gallops  Vascular: Vessel Right Left  Radial Palpable Palpable  Ulnar Not Palpable Not Palpable  Brachial Palpable Palpable  Carotid Palpable, without bruit Palpable, without bruit  Aorta Not palpable N/A  Femoral Palpable Palpable  Popliteal Not palpable Not palpable  PT Palpable Palpable  DP Palpable Palpable   Gastrointestinal: soft, NTND, -G/R, - HSM, - masses, - CVAT B  Musculoskeletal: M/S 5/5 throughout , Extremities without ischemic changes   Neurologic: CN 2-12 intact , Pain and light touch intact in extremities , Motor exam as listed above  Psychiatric: Judgment intact, appropriately upset  Dermatologic: See M/S exam for extremity exam, no rashes otherwise noted  Lymph : No Cervical, Axillary, or Inguinal lymphadenopathy    Non-Invasive Vascular Imaging  Vein Mapping  (Date: 07/08/2013):   R arm: acceptable vein conduits include marginal basilic vein  L arm: acceptable vein conduits include marginal basilic vein  BUE arterial duplex (07/08/2013)  Triphasic blood flow throughout both arms  Outside Studies/Documentation 8 pages of outside documents were reviewed including: outside nephrology charts.  Medical Decision Making  Christopher Wang is a 27 y.o. male who presents with ESRD requiring hemodialysis.    Based on my discussion with the patient, I suspect his understanding of dialysis is limited.  Based on vein mapping and examination, this patient's permanent access options include: possible B staged basilic vein transposition vs brachial vein transposition.  I would try to avoid an AVG in this patient given his age, as my experience has been young patients have a vigorous neointimal hyperplastic reaction at there venous anastomosis.    I had an extensive discussion with this patient in regards to the nature of access surgery, including risk, benefits, and alternatives.    The patient is aware that the risks of access surgery include but are not limited to: bleeding, infection, steal syndrome, nerve damage, ischemic monomelic neuropathy, failure of access to mature, and possible need for additional access procedures in the future. I discussed with the patient the nature of the staged access procedure, specifically the need for a second operation to transpose the first stage fistula if it matures adequately.   The patient is distraught and wants to discuss things with his nephrologist prior to proceeding.  Adele Barthel, MD [facility] Office: [phone] Pager: [phone]  07/08/2013, 11:41 AM

## 2013-07-11 ENCOUNTER — Encounter: Payer: Self-pay | Admitting: Nephrology

## 2013-07-15 ENCOUNTER — Emergency Department (HOSPITAL_COMMUNITY): Payer: Medicaid Other

## 2013-07-15 ENCOUNTER — Inpatient Hospital Stay (HOSPITAL_COMMUNITY)
Admission: EM | Admit: 2013-07-15 | Discharge: 2013-07-22 | DRG: 673 | Disposition: A | Discharge status: Home / Self Care | Payer: Medicaid Other | Attending: Internal Medicine | Admitting: Internal Medicine

## 2013-07-15 ENCOUNTER — Encounter (HOSPITAL_COMMUNITY): Payer: Self-pay | Admitting: Emergency Medicine

## 2013-07-15 DIAGNOSIS — I10 Essential (primary) hypertension: Secondary | ICD-10-CM

## 2013-07-15 DIAGNOSIS — D631 Anemia in chronic kidney disease: Secondary | ICD-10-CM | POA: Diagnosis present

## 2013-07-15 DIAGNOSIS — I509 Heart failure, unspecified: Secondary | ICD-10-CM | POA: Diagnosis present

## 2013-07-15 DIAGNOSIS — N184 Chronic kidney disease, stage 4 (severe): Secondary | ICD-10-CM

## 2013-07-15 DIAGNOSIS — R600 Localized edema: Secondary | ICD-10-CM

## 2013-07-15 DIAGNOSIS — Z79899 Other long term (current) drug therapy: Secondary | ICD-10-CM | POA: Diagnosis not present

## 2013-07-15 DIAGNOSIS — I120 Hypertensive chronic kidney disease with stage 5 chronic kidney disease or end stage renal disease: Secondary | ICD-10-CM | POA: Diagnosis not present

## 2013-07-15 DIAGNOSIS — K5289 Other specified noninfective gastroenteritis and colitis: Secondary | ICD-10-CM

## 2013-07-15 DIAGNOSIS — Z91199 Patient's noncompliance with other medical treatment and regimen due to unspecified reason: Secondary | ICD-10-CM | POA: Diagnosis not present

## 2013-07-15 DIAGNOSIS — N049 Nephrotic syndrome with unspecified morphologic changes: Secondary | ICD-10-CM

## 2013-07-15 DIAGNOSIS — I428 Other cardiomyopathies: Secondary | ICD-10-CM | POA: Diagnosis present

## 2013-07-15 DIAGNOSIS — N032 Chronic nephritic syndrome with diffuse membranous glomerulonephritis: Secondary | ICD-10-CM | POA: Diagnosis present

## 2013-07-15 DIAGNOSIS — R112 Nausea with vomiting, unspecified: Secondary | ICD-10-CM | POA: Diagnosis not present

## 2013-07-15 DIAGNOSIS — N5089 Other specified disorders of the male genital organs: Secondary | ICD-10-CM

## 2013-07-15 DIAGNOSIS — N186 End stage renal disease: Secondary | ICD-10-CM

## 2013-07-15 DIAGNOSIS — N189 Chronic kidney disease, unspecified: Secondary | ICD-10-CM

## 2013-07-15 DIAGNOSIS — I5023 Acute on chronic systolic (congestive) heart failure: Secondary | ICD-10-CM | POA: Diagnosis present

## 2013-07-15 DIAGNOSIS — N185 Chronic kidney disease, stage 5: Secondary | ICD-10-CM

## 2013-07-15 DIAGNOSIS — I5022 Chronic systolic (congestive) heart failure: Secondary | ICD-10-CM

## 2013-07-15 DIAGNOSIS — K529 Noninfective gastroenteritis and colitis, unspecified: Secondary | ICD-10-CM

## 2013-07-15 DIAGNOSIS — R809 Proteinuria, unspecified: Secondary | ICD-10-CM

## 2013-07-15 DIAGNOSIS — N041 Nephrotic syndrome with focal and segmental glomerular lesions: Secondary | ICD-10-CM

## 2013-07-15 DIAGNOSIS — N19 Unspecified kidney failure: Secondary | ICD-10-CM

## 2013-07-15 DIAGNOSIS — D649 Anemia, unspecified: Secondary | ICD-10-CM

## 2013-07-15 DIAGNOSIS — F17200 Nicotine dependence, unspecified, uncomplicated: Secondary | ICD-10-CM | POA: Diagnosis present

## 2013-07-15 DIAGNOSIS — E876 Hypokalemia: Secondary | ICD-10-CM

## 2013-07-15 DIAGNOSIS — Z23 Encounter for immunization: Secondary | ICD-10-CM | POA: Diagnosis not present

## 2013-07-15 DIAGNOSIS — E785 Hyperlipidemia, unspecified: Secondary | ICD-10-CM

## 2013-07-15 DIAGNOSIS — Z9119 Patient's noncompliance with other medical treatment and regimen: Secondary | ICD-10-CM

## 2013-07-15 DIAGNOSIS — N2581 Secondary hyperparathyroidism of renal origin: Secondary | ICD-10-CM | POA: Diagnosis present

## 2013-07-15 DIAGNOSIS — E8809 Other disorders of plasma-protein metabolism, not elsewhere classified: Secondary | ICD-10-CM

## 2013-07-15 DIAGNOSIS — N289 Disorder of kidney and ureter, unspecified: Secondary | ICD-10-CM

## 2013-07-15 HISTORY — DX: Noninfective gastroenteritis and colitis, unspecified: K52.9

## 2013-07-15 HISTORY — DX: Chronic kidney disease, unspecified: N18.9

## 2013-07-15 LAB — URINE MICROSCOPIC-ADD ON

## 2013-07-15 LAB — COMPREHENSIVE METABOLIC PANEL
ALT: 9 U/L (ref 0–53)
AST: 14 U/L (ref 0–37)
Albumin: 4 g/dL (ref 3.5–5.2)
Alkaline Phosphatase: 67 U/L (ref 39–117)
BUN: 96 mg/dL — ABNORMAL HIGH (ref 6–23)
CO2: 20 mEq/L (ref 19–32)
Calcium: 7.3 mg/dL — ABNORMAL LOW (ref 8.4–10.5)
Chloride: 99 mEq/L (ref 96–112)
Creatinine, Ser: 10.62 mg/dL — ABNORMAL HIGH (ref 0.50–1.35)
GFR calc Af Amer: 7 mL/min — ABNORMAL LOW (ref >90)
GFR calc non Af Amer: 6 mL/min — ABNORMAL LOW (ref >90)
Glucose, Bld: 112 mg/dL — ABNORMAL HIGH (ref 70–99)
Potassium: 4.7 mEq/L (ref 3.5–5.1)
Sodium: 140 mEq/L (ref 135–145)
Total Bilirubin: 0.4 mg/dL (ref 0.3–1.2)
Total Protein: 8.2 g/dL (ref 6.0–8.3)

## 2013-07-15 LAB — CBC WITH DIFFERENTIAL/PLATELET
Basophils Absolute: 0 10*3/uL (ref 0.0–0.1)
Basophils Relative: 0 % (ref 0–1)
Eosinophils Absolute: 0 10*3/uL (ref 0.0–0.7)
Eosinophils Relative: 0 % (ref 0–5)
HCT: 34.2 % — ABNORMAL LOW (ref 39.0–52.0)
Hemoglobin: 11.2 g/dL — ABNORMAL LOW (ref 13.0–17.0)
Lymphocytes Relative: 6 % — ABNORMAL LOW (ref 12–46)
Lymphs Abs: 0.6 10*3/uL — ABNORMAL LOW (ref 0.7–4.0)
MCH: 28.6 pg (ref 26.0–34.0)
MCHC: 32.7 g/dL (ref 30.0–36.0)
MCV: 87.5 fL (ref 78.0–100.0)
Monocytes Absolute: 0.2 10*3/uL (ref 0.1–1.0)
Monocytes Relative: 2 % — ABNORMAL LOW (ref 3–12)
Neutro Abs: 9.2 10*3/uL — ABNORMAL HIGH (ref 1.7–7.7)
Neutrophils Relative %: 92 % — ABNORMAL HIGH (ref 43–77)
Platelets: 203 10*3/uL (ref 150–400)
RBC: 3.91 MIL/uL — ABNORMAL LOW (ref 4.22–5.81)
RDW: 13.3 % (ref 11.5–15.5)
WBC: 10 10*3/uL (ref 4.0–10.5)

## 2013-07-15 LAB — URINALYSIS, ROUTINE W REFLEX MICROSCOPIC
Bilirubin Urine: NEGATIVE
Glucose, UA: NEGATIVE mg/dL
Ketones, ur: NEGATIVE mg/dL
Leukocytes, UA: NEGATIVE
Nitrite: NEGATIVE
Protein, ur: >300 mg/dL — AB
Specific Gravity, Urine: 1.024 (ref 1.005–1.030)
Urobilinogen, UA: 0.2 mg/dL (ref 0.0–1.0)
pH: 6 (ref 5.0–8.0)

## 2013-07-15 LAB — LIPASE, BLOOD: Lipase: 168 U/L — ABNORMAL HIGH (ref 11–59)

## 2013-07-15 MED ORDER — ONDANSETRON HCL 4 MG/2ML IJ SOLN
4.0000 mg | Freq: Once | INTRAMUSCULAR | Status: AC
  Administered 2013-07-15: 4 mg via INTRAVENOUS
  Filled 2013-07-15: qty 2

## 2013-07-15 MED ORDER — INFLUENZA VAC SPLIT QUAD 0.5 ML IM SUSP
0.5000 mL | INTRAMUSCULAR | Status: AC
  Administered 2013-07-16: 0.5 mL via INTRAMUSCULAR
  Filled 2013-07-15: qty 0.5

## 2013-07-15 MED ORDER — CLONIDINE HCL 0.2 MG PO TABS
0.2000 mg | ORAL_TABLET | Freq: Once | ORAL | Status: AC
  Administered 2013-07-15: 0.2 mg via ORAL
  Filled 2013-07-15: qty 1

## 2013-07-15 MED ORDER — HYDROCODONE-ACETAMINOPHEN 5-325 MG PO TABS
1.0000 | ORAL_TABLET | ORAL | Status: DC | PRN
Start: 2013-07-15 — End: 2013-07-22
  Administered 2013-07-16 – 2013-07-21 (×4): 1 via ORAL
  Filled 2013-07-15 (×4): qty 1

## 2013-07-15 MED ORDER — SODIUM CHLORIDE 0.9 % IJ SOLN
3.0000 mL | Freq: Two times a day (BID) | INTRAMUSCULAR | Status: DC
  Administered 2013-07-15 – 2013-07-22 (×10): 3 mL via INTRAVENOUS

## 2013-07-15 MED ORDER — SODIUM CHLORIDE 0.9 % IV SOLN
INTRAVENOUS | Status: DC
  Administered 2013-07-15: 13:00:00 via INTRAVENOUS

## 2013-07-15 MED ORDER — PNEUMOCOCCAL VAC POLYVALENT 25 MCG/0.5ML IJ INJ
0.5000 mL | INJECTION | INTRAMUSCULAR | Status: AC
  Administered 2013-07-16: 0.5 mL via INTRAMUSCULAR
  Filled 2013-07-15: qty 0.5

## 2013-07-15 MED ORDER — HEPARIN SODIUM (PORCINE) 5000 UNIT/ML IJ SOLN
5000.0000 [IU] | Freq: Three times a day (TID) | INTRAMUSCULAR | Status: DC
  Administered 2013-07-15 – 2013-07-22 (×11): 5000 [IU] via SUBCUTANEOUS
  Filled 2013-07-15 (×24): qty 1

## 2013-07-15 MED ORDER — SODIUM CHLORIDE 0.9 % IV SOLN: INTRAVENOUS | Status: DC

## 2013-07-15 MED ORDER — SODIUM CHLORIDE 0.9 % IV BOLUS (SEPSIS)
1000.0000 mL | Freq: Once | INTRAVENOUS | Status: AC
  Administered 2013-07-15: 1000 mL via INTRAVENOUS

## 2013-07-15 MED ORDER — ALUM & MAG HYDROXIDE-SIMETH 200-200-20 MG/5ML PO SUSP
30.0000 mL | Freq: Four times a day (QID) | ORAL | Status: DC | PRN
  Filled 2013-07-15: qty 30

## 2013-07-15 MED ORDER — AMLODIPINE BESYLATE 10 MG PO TABS
10.0000 mg | ORAL_TABLET | Freq: Every day | ORAL | Status: DC
  Administered 2013-07-15 – 2013-07-22 (×7): 10 mg via ORAL
  Filled 2013-07-15 (×9): qty 1

## 2013-07-15 MED ORDER — IOHEXOL 300 MG/ML  SOLN
50.0000 mL | Freq: Once | INTRAMUSCULAR | Status: AC | PRN
  Administered 2013-07-15: 50 mL via ORAL

## 2013-07-15 MED ORDER — ACETAMINOPHEN 325 MG PO TABS
650.0000 mg | ORAL_TABLET | Freq: Four times a day (QID) | ORAL | Status: DC | PRN
  Administered 2013-07-18 – 2013-07-22 (×4): 650 mg via ORAL
  Filled 2013-07-15 (×3): qty 2

## 2013-07-15 MED ORDER — CLONIDINE HCL 0.2 MG PO TABS
0.2000 mg | ORAL_TABLET | Freq: Two times a day (BID) | ORAL | Status: DC
  Administered 2013-07-15 – 2013-07-19 (×7): 0.2 mg via ORAL
  Filled 2013-07-15 (×9): qty 1

## 2013-07-15 MED ORDER — HYDROMORPHONE HCL PF 1 MG/ML IJ SOLN
1.0000 mg | INTRAMUSCULAR | Status: DC | PRN
  Administered 2013-07-19 (×2): 1 mg via INTRAVENOUS
  Filled 2013-07-15: qty 1

## 2013-07-15 MED ORDER — CIPROFLOXACIN IN D5W 400 MG/200ML IV SOLN
400.0000 mg | INTRAVENOUS | Status: DC
  Administered 2013-07-15 – 2013-07-17 (×3): 400 mg via INTRAVENOUS
  Filled 2013-07-15 (×4): qty 200

## 2013-07-15 MED ORDER — METOCLOPRAMIDE HCL 5 MG/ML IJ SOLN
10.0000 mg | Freq: Once | INTRAMUSCULAR | Status: AC
  Administered 2013-07-15: 10 mg via INTRAVENOUS
  Filled 2013-07-15: qty 2

## 2013-07-15 MED ORDER — METRONIDAZOLE IN NACL 5-0.79 MG/ML-% IV SOLN
500.0000 mg | Freq: Three times a day (TID) | INTRAVENOUS | Status: DC
  Administered 2013-07-15 – 2013-07-17 (×7): 500 mg via INTRAVENOUS
  Filled 2013-07-15 (×9): qty 100

## 2013-07-15 MED ORDER — ACETAMINOPHEN 650 MG RE SUPP: 650.0000 mg | Freq: Four times a day (QID) | RECTAL | Status: DC | PRN

## 2013-07-15 MED ORDER — ONDANSETRON HCL 4 MG PO TABS: 4.0000 mg | ORAL_TABLET | Freq: Four times a day (QID) | ORAL | Status: DC | PRN

## 2013-07-15 MED ORDER — MORPHINE SULFATE 4 MG/ML IJ SOLN
4.0000 mg | Freq: Once | INTRAMUSCULAR | Status: AC
  Administered 2013-07-15: 4 mg via INTRAVENOUS
  Filled 2013-07-15: qty 1

## 2013-07-15 MED ORDER — FENTANYL CITRATE 0.05 MG/ML IJ SOLN: 50.0000 ug | Freq: Once | INTRAMUSCULAR | Status: DC

## 2013-07-15 MED ORDER — ONDANSETRON HCL 4 MG/2ML IJ SOLN
4.0000 mg | Freq: Four times a day (QID) | INTRAMUSCULAR | Status: DC | PRN
  Administered 2013-07-15 – 2013-07-19 (×2): 4 mg via INTRAVENOUS
  Filled 2013-07-15 (×2): qty 2

## 2013-07-15 NOTE — Progress Notes (Addendum)
Pt admitted to [facility] from [facility] ED. Received report from [place], Therapist, sports. Pt transported via carelink. Pt is alert and oriented to staff, call bell, and room. Belongings at bedside. Bed in lowest position. Call bell within reach. Full assessment to Epic. Will continue to monitor. Henry Russel, RN

## 2013-07-15 NOTE — ED Notes (Addendum)
Patient is aware we need a urine specimen. Patient has a urinal at bedside.

## 2013-07-15 NOTE — ED Notes (Signed)
Bed: [id number] Expected date:  Expected time:  Means of arrival:  Comments: EMS/27 yo with nausea and vomiting since 1300 yesterday

## 2013-07-15 NOTE — Consult Note (Signed)
I have examined the patient, reviewed and agree with above. Patient admitted with nausea and vomiting. Has known chronic renal insufficiency. Wynetta Fines Dr. Bridgett Larsson in our office one week ago with discussion of access placement. Has poor surface veins. Extremely noncompliant and not answering questions well with me. I did try to explain the need for acute and chronic access for hemodialysis and options. He is right-handed with a left forearm IV. Will write to change IV to right arm to save the left arm. Will plan to the catheter and left arm AV access on Monday. Available over the weekend if he requires urgent dialysis before Monday  Izack Hoogland, MD 07/15/2013 2:48 PM

## 2013-07-15 NOTE — ED Notes (Signed)
I checked on patient's progress on the urine specimen, patient said "give me two minutes." Patient also asked for popsicle, crackers, and orange juice.

## 2013-07-15 NOTE — ED Provider Notes (Signed)
7:45 AM patient seen by me complains of multiple episodes of vomiting onset last night. Also complains of vague diffuse abdominal discomfort. Complaint of shortness of breath "only when vomittng. Last urinated immediately prior to coming here last bowel movement 2 days ago. No diarrhea. No fever. On exam no distress alert nontoxic lungs clear auscultation heart regular rate and rhythm abdomen nondistended normal active bowel sounds minimal diffuse tenderness. Genitalia normal male. Spoke with Dr.Dunham, patient will require transfer to [facility]. She will consult on the case.  8:27am feels improved after treatment with intravenous opioids and antiemetics  Date: 07/15/2013  Rate: 95  Rhythm: normal sinus rhythm  QRS Axis: normal  Intervals: normal  ST/T Wave abnormalities: nonspecific T wave changes  Conduction Disutrbances:none  Narrative Interpretation:   Old EKG Reviewed: Nonspecific lateral T wave changes new since 08/06/2011 interpreted by me otherwise no significant change. 10:45 A M patient continues to rest comfortably. No distress. States he feels improved since having been medicated at 8:27 AM. Spoke with Dr,. Rai who will arrange for transfer to [facility] telemetry floor. Results for orders placed during the hospital encounter of 07/15/13  CBC WITH DIFFERENTIAL      Result Value Range   WBC 10.0  4.0 - 10.5 K/uL   RBC 3.91 (*) 4.22 - 5.81 MIL/uL   Hemoglobin 11.2 (*) 13.0 - 17.0 g/dL   HCT 34.2 (*) 39.0 - 52.0 %   MCV 87.5  78.0 - 100.0 fL   MCH 28.6  26.0 - 34.0 pg   MCHC 32.7  30.0 - 36.0 g/dL   RDW 13.3  11.5 - 15.5 %   Platelets 203  150 - 400 K/uL   Neutrophils Relative % 92 (*) 43 - 77 %   Neutro Abs 9.2 (*) 1.7 - 7.7 K/uL   Lymphocytes Relative 6 (*) 12 - 46 %   Lymphs Abs 0.6 (*) 0.7 - 4.0 K/uL   Monocytes Relative 2 (*) 3 - 12 %   Monocytes Absolute 0.2  0.1 - 1.0 K/uL   Eosinophils Relative 0  0 - 5 %   Eosinophils Absolute 0.0  0.0 - 0.7 K/uL    Basophils Relative 0  0 - 1 %   Basophils Absolute 0.0  0.0 - 0.1 K/uL  COMPREHENSIVE METABOLIC PANEL      Result Value Range   Sodium 140  135 - 145 mEq/L   Potassium 4.7  3.5 - 5.1 mEq/L   Chloride 99  96 - 112 mEq/L   CO2 20  19 - 32 mEq/L   Glucose, Bld 112 (*) 70 - 99 mg/dL   BUN 96 (*) 6 - 23 mg/dL   Creatinine, Ser 10.62 (*) 0.50 - 1.35 mg/dL   Calcium 7.3 (*) 8.4 - 10.5 mg/dL   Total Protein 8.2  6.0 - 8.3 g/dL   Albumin 4.0  3.5 - 5.2 g/dL   AST 14  0 - 37 U/L   ALT 9  0 - 53 U/L   Alkaline Phosphatase 67  39 - 117 U/L   Total Bilirubin 0.4  0.3 - 1.2 mg/dL   GFR calc non Af Amer 6 (*) >90 mL/min   GFR calc Af Amer 7 (*) >90 mL/min  LIPASE, BLOOD      Result Value Range   Lipase 168 (*) 11 - 59 U/L   Ct Abdomen Pelvis Wo Contrast  07/15/2013   CLINICAL DATA:  Abdominal pain.  EXAM: CT ABDOMEN AND PELVIS  WITHOUT CONTRAST  TECHNIQUE: Multidetector CT imaging of the abdomen and pelvis was performed following the standard protocol without intravenous contrast.  COMPARISON:  None.  FINDINGS: Visualized lung bases appear normal. No gallstones are noted. The liver, spleen and pancreas appeared appear normal. However, there is noted fluid around the spleen. The appendix appears normal. There is noted significant wall and fold thickening involving proximal small bowel consistent with enteritis or other inflammation. Free fluid is also noted in the dependent portion of the pelvis. Urinary bladder appears normal. Adrenal glands appear normal. Mild bilateral renal atrophy is noted.  IMPRESSION: Mild bilateral renal atrophy. Severe wall and fold thickening of proximal small bowel is noted most consistent with inflammation or enteritis ; however infiltrative disease such as lymphoma cannot be excluded and clinical correlation is recommend. Small amount of free fluid is noted around the spleen as well as in the dependent portion of the pelvis.   Electronically Signed   By: Sabino Dick M.D.    On: 07/15/2013 10:11   Dg Chest 2 View  07/15/2013   CLINICAL DATA:  Shortness of breath  EXAM: CHEST  2 VIEW  COMPARISON:  Prior radiograph from 02/23/2012  FINDINGS: Cardiac and mediastinal silhouettes are stable in size and contour, and remain within normal limits.  Lungs are normally inflated without airspace consolidation, pleural effusion, or pulmonary edema. There is no pneumothorax.  Osseous structures are within normal limits.  IMPRESSION: No active cardiopulmonary disease.   Electronically Signed   By: Jeannine Boga M.D.   On: 07/15/2013 06:39     Orlie Dakin, MD 07/15/13 1053

## 2013-07-15 NOTE — ED Notes (Signed)
Patient given 2 popsicles and 2 apple juices.

## 2013-07-15 NOTE — ED Notes (Signed)
MD at bedside. 

## 2013-07-15 NOTE — Progress Notes (Signed)
[facility] hospitalist progress note. Chief complaint. Transfer note. History of present illness. This 27 year old male presented to [facility] long hospital with nausea, vomiting, chills, and shortness of breath. Patient has noted chronic kidney disease, nephrotic syndrome with biopsy-proven collapsing FSGS (idiopathic) patient was noted to have a markedly elevated creatinine and greater than 10.0. Patient was felt to require transfer to [facility] for possible dialysis catheter placement and initiation of hemodialysis. He is already been seen by Doctor Early vascular surgery and Doctor [facility] nephrology. I'm seeing the patient at bedside to ensure he remained stable post transfer and that all orders appear to have transferred appropriately. Patient currently has no complaints. Vital signs. Temperature 99.3, pulse 96, respiration 18, blood pressure 129/72. O2 sats 99%. General appearance. Well-developed young male who is alert, cooperative, and in no distress. Cardiac. Rate and rhythm regular. Lungs. Breath sounds clear and equal bilaterally. Abdomen. Soft with positive bowel sounds. Mild diffuse pain with palpation. Impression/plan. Problem #1. Acute on chronic renal insufficiency. Patient for dialysis catheter placement and initiation of dialysis. Per my discussion with the patient he requests a second opinion from nephrology. Will leave a message to [facility] hospitalist rounding physician regarding this. Problem #2. Enteritis. Patient is on antibiotic therapy and these orders have transferred. IV fluids and clear liquid diet continue but patient now n.p.o. pending surgery. I have asked nursing to check with surgery to see when patient actually needs to be n.p.o. for pending dialysis shunt placement. The patient appears clinically stable post transfer and all orders appear to have transferred appropriately.

## 2013-07-15 NOTE — ED Notes (Signed)
Patient handed a urinal, asked patient to please try to supply a urine sample. Patient said he will try.

## 2013-07-15 NOTE — Consult Note (Signed)
[facility] NOTE   MRN : [id number]  Reason for Consult: chronic kidney disease Referring Physician: Dr. Lorrene Reid  History of Present Illness: 27 y.o. (03/30/1986) male who presents for evaluation for permanent access and immediate need for diatek catheter placement secondary to his creatinine of 10.62 (was 8.0, 2 weeks ago.           The patient is right hand dominant. The patient has not had previous access procedures. Previous central venous cannulation procedures include: none. The patient has never had a PPM placed.   Past medical history includes hypertension, nephrotic syndrome, biopsy proven collapsing FSGS (idiopathic) in 2012, non HIV.  He was hospitalized secondary to intractable nausea and vomiting  Since 07/14/2013.        Current Facility-Administered Medications  Medication Dose Route Frequency Provider Last Rate Last Dose  . 0.9 %  sodium chloride infusion   Intravenous Continuous Kristen N Ward, DO      . 0.9 %  sodium chloride infusion   Intravenous Continuous Lucrezia Starch, MD 50 mL/hr at 07/15/13 1314    . acetaminophen (TYLENOL) tablet 650 mg  650 mg Oral Q6H PRN Ripudeep Krystal Eaton, MD       Or  . acetaminophen (TYLENOL) suppository 650 mg  650 mg Rectal Q6H PRN Ripudeep Krystal Eaton, MD      . alum & mag hydroxide-simeth (MAALOX/MYLANTA) 200-200-20 MG/5ML suspension 30 mL  30 mL Oral Q6H PRN Ripudeep K Rai, MD      . amLODipine (NORVASC) tablet 10 mg  10 mg Oral Daily Ripudeep K Rai, MD      . ciprofloxacin (CIPRO) IVPB 400 mg  400 mg Intravenous Q24H Ripudeep K Rai, MD      . cloNIDine (CATAPRES) tablet 0.2 mg  0.2 mg Oral Once Lucrezia Starch, MD      . fentaNYL (SUBLIMAZE) injection 50 mcg  50 mcg Intravenous Once Orlie Dakin, MD      . heparin injection 5,000 Units  5,000 Units Subcutaneous Q8H Ripudeep K Rai, MD      . HYDROcodone-acetaminophen (NORCO/VICODIN) 5-325 MG per tablet 1 tablet  1 tablet Oral Q4H PRN Ripudeep K Rai, MD       . HYDROmorphone (DILAUDID) injection 1 mg  1 mg Intravenous Q4H PRN Ripudeep K Rai, MD      . metroNIDAZOLE (FLAGYL) IVPB 500 mg  500 mg Intravenous Q8H Ripudeep K Rai, MD      . ondansetron (ZOFRAN) tablet 4 mg  4 mg Oral Q6H PRN Ripudeep K Rai, MD       Or  . ondansetron (ZOFRAN) injection 4 mg  4 mg Intravenous Q6H PRN Ripudeep K Rai, MD      . sodium chloride 0.9 % injection 3 mL  3 mL Intravenous Q12H Ripudeep K Rai, MD        Pt meds include: Statin :No Betablocker: No ASA: No Other anticoagulants/antiplatelets: None  Past Medical History  Diagnosis Date  . Anemia   . Nephrotic syndrome   . Hypertension 08/09/2011    Past Surgical History  Procedure Laterality Date  . Renal biopsy      Social History History  Substance Use Topics  . Smoking status: Passive Smoke Exposure - Never Smoker    Types: Cigars  . Smokeless tobacco: Never Used  . Alcohol Use: 0.6 oz/week    1 Shots of liquor per week    Family History Family History  Problem Relation  Age of Onset  . Hypertension Mother     No Known Allergies   REVIEW OF SYSTEMS  General: [ ]  Weight loss, [ ]  Fever, [x ] chills[x]  nausea [x]  vomiting Neurologic: [ ]  Dizziness, [ ]  Blackouts, [ ]  Seizure [ ]  Stroke, [ ]  "Mini stroke", [ ]  Slurred speech, [ ]  Temporary blindness; [ ]  weakness in arms or legs, [ ]  Hoarseness [ ]  Dysphagia Cardiac: [ ]  Chest pain/pressure, [ ]  Shortness of breath at rest [ ]  Shortness of breath with exertion, [ ]  Atrial fibrillation or irregular heartbeat  Vascular: [ ]  Pain in legs with walking, [ ]  Pain in legs at rest, [ ]  Pain in legs at night,  [ ]  Non-healing ulcer, [ ]  Blood clot in vein/DVT,   Pulmonary: [ ]  Home oxygen, [ ]  Productive cough, [ ]  Coughing up blood, [ ]  Asthma,  [ ]  Wheezing [ ]  COPD Musculoskeletal:  [ ]  Arthritis, [ ]  Low back pain, [ ]  Joint pain Hematologic: [ ]  Easy Bruising, [ ]  Anemia; [ ]  Hepatitis Gastrointestinal: [ ]  Blood in stool, [ ]   Gastroesophageal Reflux/heartburn, Urinary: [x ] chronic Kidney disease, [ ]  on HD - [ ]  MWF or [ ]  TTHS, [ ]  Burning with urination, [ ]  Difficulty urinating Skin: [ ]  Rashes, [ ]  Wounds Psychological: [ ]  Anxiety, [ ]  Depression  Physical Examination Filed Vitals:   07/15/13 0525 07/15/13 0957 07/15/13 1137 07/15/13 1241  BP:  160/96 160/110 154/134  Pulse:  99 99 96  Temp:    98.9 F (37.2 C)  TempSrc:      Resp:  18 16 18   Height:    5\' 11"  (1.803 m)  Weight:    166 lb 14.4 oz (75.705 kg)  SpO2: 98% 100% 100% 100%   Body mass index is 23.29 kg/(m^2).  General:  WDWN in NAD HENT: WNL Eyes: Pupils equal Pulmonary: normal non-labored breathing , without Rales, rhonchi,  wheezing Cardiac: RRR, without  Murmurs, rubs or gallops; No carotid bruits Abdomen: soft, NTTP Skin: no rashes, ulcers noted;  no Gangrene , no cellulitis; no open wounds;    Vascular:  Vessel  Right  Left   Radial  Palpable  Palpable   Ulnar  Not Palpable  Not Palpable   Brachial  Palpable  Palpable   Carotid  Palpable, without bruit  Palpable, without bruit   Aorta  Not palpable  N/A   Femoral  Palpable  Palpable   Popliteal  Not palpable  Not palpable   PT  Palpable  Palpable   DP  Palpable  Palpable      Musculoskeletal: no muscle wasting or atrophy; no edema  Neurologic: A&O X 3; Appropriate Affect ;  SENSATION: normal; MOTOR FUNCTION: 5/5 Symmetric Speech is fluent/normal   Significant Diagnostic Studies: CBC Lab Results  Component Value Date   WBC 10.0 07/15/2013   HGB 11.2* 07/15/2013   HCT 34.2* 07/15/2013   MCV 87.5 07/15/2013   PLT 203 07/15/2013    BMET    Component Value Date/Time   NA 140 07/15/2013 0610   K 4.7 07/15/2013 0610   CL 99 07/15/2013 0610   CO2 20 07/15/2013 0610   GLUCOSE 112* 07/15/2013 0610   BUN 96* 07/15/2013 0610   CREATININE 10.62* 07/15/2013 0610   CALCIUM 7.3* 07/15/2013 0610   GFRNONAA 6* 07/15/2013 0610   GFRAA 7* 07/15/2013 0610    Estimated Creatinine Clearance: 11.3 ml/min (by C-G formula based on Cr  of 10.62).  COAG Lab Results  Component Value Date   INR 0.88 08/08/2011     Non-Invasive Vascular Imaging:   Vein Mapping (Date: 07/08/2013):  R arm: acceptable vein conduits include marginal basilic vein  L arm: acceptable vein conduits include marginal basilic vein BUE arterial duplex (07/08/2013)  Triphasic blood flow throughout both arms  ASSESSMENT/PLAN:  Christopher Wang is a 27 y.o. male who presents with ESRD requiring hemodialysis.   The plan for a diatek and  fistula creation timing will be discussed with Dr. Curt Jews.  Laurence Slate [facility] 07/15/2013 1:44 PM

## 2013-07-15 NOTE — ED Notes (Signed)
Pt actively vomiting, MD notified

## 2013-07-15 NOTE — ED Notes (Signed)
Pt. Made aware for the need of urine. 

## 2013-07-15 NOTE — Progress Notes (Signed)
Patient wants to get a second opinion from another renal MD and won't sign consent for AVG/AVF placement until he talks to another Renal MD. [facility]

## 2013-07-15 NOTE — ED Provider Notes (Signed)
TIME SEEN: 5:25 AM  CHIEF COMPLAINT: Chills, vomiting, shortness of breath  HPI: Patient is a 27 year old male with a history of hypertension, nephrotic syndrome who is scheduled to have a vascular access placed for future dialysis, anemia who presents to the emergency department with nausea and vomiting that started at 1 PM yesterday. He denies any blood or bile in his vomit. He states with his vomiting he becomes very short of breath but then after an episode of vomiting he will remain feeling short of breath for several minutes which is abnormal for him. He denies that he's had any cough. No chest pain. He's had diffuse, mild abdominal cramping. No dysuria or hematuria. No sick contacts. No recent international travel. Last bowel movement was today and was normal. No bloody stools or melena. No diarrhea.  ROS: See HPI Constitutional: no fever  Eyes: no drainage  ENT: no runny nose   Cardiovascular:  no chest pain  Resp: SOB  GI: no vomiting GU: no dysuria Integumentary: no rash  Allergy: no hives  Musculoskeletal: no leg swelling  Neurological: no slurred speech ROS otherwise negative  PAST MEDICAL HISTORY/PAST SURGICAL HISTORY:  Past Medical History  Diagnosis Date  . Anemia   . Nephrotic syndrome   . Hypertension 08/09/2011    MEDICATIONS:  Prior to Admission medications   Medication Sig Start Date End Date Taking? Authorizing Provider  amLODipine (NORVASC) 10 MG tablet Take 10 mg by mouth daily.   Yes Historical Provider, MD    ALLERGIES:  No Known Allergies  SOCIAL HISTORY:  History  Substance Use Topics  . Smoking status: Passive Smoke Exposure - Never Smoker    Types: Cigars  . Smokeless tobacco: Never Used  . Alcohol Use: 0.6 oz/week    1 Shots of liquor per week    FAMILY HISTORY: Family History  Problem Relation Age of Onset  . Hypertension Mother     EXAM: BP 155/110  Pulse 101  Temp(Src) 98.7 F (37.1 C) (Oral)  Resp 16  SpO2  98% CONSTITUTIONAL: Alert and oriented and responds appropriately to questions. Well-appearing; well-nourished; patient appears uncomfortable, pt has chills HEAD: Normocephalic EYES: Conjunctivae clear, PERRL ENT: normal nose; no rhinorrhea; slightly dry mucous membranes; pharynx without lesions noted NECK: Supple, no meningismus, no LAD  CARD: RRR; S1 and S2 appreciated; no murmurs, no clicks, no rubs, no gallops RESP: Normal chest excursion without splinting or tachypnea; breath sounds clear and equal bilaterally; no wheezes, no rhonchi, no rales,  ABD/GI: Normal bowel sounds; non-distended; soft, non-tender, no rebound, no guarding BACK:  The back appears normal and is non-tender to palpation, there is no CVA tenderness EXT: Normal ROM in all joints; non-tender to palpation; no edema; normal capillary refill; no cyanosis    SKIN: Normal color for age and race; warm NEURO: Moves all extremities equally PSYCH: The patient's mood and manner are appropriate. Grooming and personal hygiene are appropriate.  MEDICAL DECISION MAKING: Patient with nausea and vomiting that started at 1 PM yesterday afternoon. His abdomen is soft and nontender to palpation diffusely. He is hemodynamically stable. He has mild tachycardia but is afebrile. He is also complaining of mild shortness of breath that occurred after an episode of vomiting. No aspiration event he can recall. His lungs are clear and his sats are 98% on room air.  No signs of volume overload on exam. Will obtain abdominal labs, urinalysis and chest x-ray. We'll give IV fluids, antiemetics and reassess.  ED PROGRESS: Patient feels his  nausea has resolved. Will po challenge.  CXR clear.  Labs pending.  7:09 AM  Pt now vomiting.  Will re-dose zofran.  7:15 AM  Pt's WBC is 10 with predominant neutrophils. Pt's creatinine is now 10.62. Creatinine was 1.10 in February of 2013. Patient is not currently on dialysis. Suspect this change is acute and may be  somewhat due to dehydration given elevation in BUN.  Patient is not sure what his Cr normally runs. He states he had it checked last week at Dr. Sanda Klein office. Will attempt to get these records. Will continue to hydrate.  On my examination, patient is now tender to palpation diffusely across the lower abdomen with voluntary guarding. Will obtain CT scan without IV contrast.  Signed out to Dr. Mylinda Latina Macaiah Mangal, DO 07/16/13 [phone]

## 2013-07-15 NOTE — Consult Note (Signed)
Requesting Physician:  Dr. Tana Coast Reason for Consult:  Renal failure HPI: The patient is a 27 y.o. year-old AAM well known to me with biopsy proven collapsing FSGS (bx 08/08/2011) (non HIV associated), hypertension, hyperlipidemia, secondary hyperparathyroidism.  Had severe nephrotic syndrome secondary to same.  He has been non-compliant with BP meds, refused to take statins for hyperlipidemia, failed steroids, stopped using ACTHAR gel  which was provided to him free by the drug company for treatment of his nephrotic syndrome, and missed all appointments in the office with me from 03/2012 to 07/05/13, at which time creatinine returned at 8.12.  He was advised of near ESRD at that time.  He did followup with Dr. Bridgett Larsson and access was scheduled for later this month.  He presented to the [facility] ED today with complaints of nausea, vomiting, abdominal discomfort (started after eating at  and headache, as well as SOB.  Eval in the ED included creatinine now up to 10, lipase 168, CTA/P c/w possible small bowel enteritis.  Admitted for further evaluation.  Antibiotics have been started.    Creatinine, Ser  Date/Time Value Range Status  07/15/2013  6:10 AM 10.62* 0.50 - 1.35 mg/dL Final  09/09/2012  2.94    11/27/2011 11:15 AM 1.10  0.50 - 1.35 mg/dL Final  08/09/2011  8:36 AM 1.39* 0.50 - 1.35 mg/dL Final  08/08/2011  5:50 AM 1.52* 0.50 - 1.35 mg/dL Final  08/07/2011  5:40 AM 1.44* 0.50 - 1.35 mg/dL Final  08/06/2011  3:20 PM 1.52* 0.50 - 1.35 mg/dL Final  08/06/2011  2:29 AM 1.39* 0.50 - 1.35 mg/dL Final    Past Medical History:  Past Medical History  Diagnosis Date  . Anemia   . Nephrotic syndrome   . Hypertension 08/09/2011    Past Surgical History:  Past Surgical History  Procedure Laterality Date  . Renal biopsy      Family History:  Family History  Problem Relation Age of Onset  . Hypertension Mother    Social History:  reports that he has been passively smoking Cigars.  He has never used  smokeless tobacco. He reports that he drinks about 0.6 ounces of alcohol per week. He reports that he does not use illicit drugs.He is uninsured.    Allergies: No Known Allergies  Home medications: Prior to Admission medications   Medication Sig Start Date End Date Taking? Authorizing Provider  amLODipine (NORVASC) 10 MG tablet Take 10 mg by mouth daily.   Yes Historical Provider, MD    Inpatient medications: . amLODipine  10 mg Oral Daily  . ciprofloxacin  400 mg Intravenous Q24H  . fentaNYL  50 mcg Intravenous Once  . heparin  5,000 Units Subcutaneous Q8H  . metronidazole  500 mg Intravenous Q8H  . sodium chloride  3 mL Intravenous Q12H    Review of Systems Gen:  + headache frequent past 2 months, anorexia HEENT:  No visual change, sore throat, difficulty swallowing. Resp:  +SOB Cardiac:  No chest pain/+edema. GI: Abd pain, n/v for 24 hours. GU:  Denies difficulty or change in voiding.  No change in urine color.     MS:  Denies joint pain or swelling.   Derm:  Denies skin rash or itching.  No chronic skin conditions.  Neuro:   Denies focal weakness, memory problems, hx stroke or TIA.   Psych:  Denies symptoms of depression of anxiety.  No hallucination.    Labs: Basic Metabolic Panel:  Recent Labs Lab 07/15/13 0610  NA  140  K 4.7  CL 99  CO2 20  GLUCOSE 112*  BUN 96*  CREATININE 10.62*  CALCIUM 7.3*   Liver Function Tests:  Recent Labs Lab 07/15/13 0610  AST 14  ALT 9  ALKPHOS 67  BILITOT 0.4  PROT 8.2  ALBUMIN 4.0    Recent Labs Lab 07/15/13 0610  LIPASE 168*    Recent Labs Lab 07/15/13 0610  WBC 10.0  NEUTROABS 9.2*  HGB 11.2*  HCT 34.2*  MCV 87.5  PLT 203   Xrays/Other Studies: Ct Abdomen Pelvis Wo Contrast  07/15/2013   CLINICAL DATA:  Abdominal pain.  EXAM: CT ABDOMEN AND PELVIS WITHOUT CONTRAST  TECHNIQUE: Multidetector CT imaging of the abdomen and pelvis was performed following the standard protocol without intravenous contrast.   COMPARISON:  None.  FINDINGS: Visualized lung bases appear normal. No gallstones are noted. The liver, spleen and pancreas appeared appear normal. However, there is noted fluid around the spleen. The appendix appears normal. There is noted significant wall and fold thickening involving proximal small bowel consistent with enteritis or other inflammation. Free fluid is also noted in the dependent portion of the pelvis. Urinary bladder appears normal. Adrenal glands appear normal. Mild bilateral renal atrophy is noted.  IMPRESSION: Mild bilateral renal atrophy. Severe wall and fold thickening of proximal small bowel is noted most consistent with inflammation or enteritis ; however infiltrative disease such as lymphoma cannot be excluded and clinical correlation is recommend. Small amount of free fluid is noted around the spleen as well as in the dependent portion of the pelvis.   Electronically Signed   By: Sabino Dick M.D.   On: 07/15/2013 10:11   Dg Chest 2 View  07/15/2013   CLINICAL DATA:  Shortness of breath  EXAM: CHEST  2 VIEW  COMPARISON:  Prior radiograph from 02/23/2012  FINDINGS: Cardiac and mediastinal silhouettes are stable in size and contour, and remain within normal limits.  Lungs are normally inflated without airspace consolidation, pleural effusion, or pulmonary edema. There is no pneumothorax.  Osseous structures are within normal limits.  IMPRESSION: No active cardiopulmonary disease.   Electronically Signed   By: Jeannine Boga M.D.   On: 07/15/2013 06:39    Physical Exam:  Blood pressure 154/134, pulse 96, temperature 98.9 F (37.2 C), temperature source Oral, resp. rate 18, height 5\' 11"  (1.803 m), weight 75.705 kg (166 lb 14.4 oz), SpO2 100.00%.  Gen: ill appearing BM VS as above Skin: no rash, cyanosis Neck: no JVD, no bruits or LAN Chest: grossly clear Heart: regular, no rub or gallop Abdomen: soft, Non distended Fairly quiet Epigastric/right mid abd tenderness No  rebound Ext: No sig edema Neuro: alert, Ox3, no focal deficit Heme/Lymph: no bruising or LAN   Impression/Plan  1. 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  [facility] has been consulted for catheter and AVF. Start dialysis after catheter placement 2. Abdominal pain - changes of enteritis on CT.  Lipase also elevated.  On ATBS per primary 3. HTN - historically poor control due to non-compliance.  Continue meds. Add clonidine. 4. Hyperlipidemia - need f/u lipid panel and will likely need statin once gi issues straightened out 5. Secondary hyperparathyroidism - PTH was 572 on 07/05/13. Start hectorol when HD starts 6. Uninsured status - will need to be seen by financial counselor as he will be unable to go on outpt HD without insurance.  Emergency Medicaid application would be appropriate.  Jamal Maes,  MD [facility] [phone] pager 07/15/2013, 1:38 PM

## 2013-07-15 NOTE — ED Notes (Addendum)
Patient is aware that we need a urine specimen. Urinal at bedside.

## 2013-07-15 NOTE — ED Notes (Signed)
Patient transported to CT 

## 2013-07-15 NOTE — ED Notes (Signed)
Pt transported to xray 

## 2013-07-15 NOTE — ED Notes (Signed)
Pt reports decrease in nausea. Given ginger ale per request

## 2013-07-15 NOTE — H&P (Signed)
History and Physical       Hospital Admission Note Date: 07/15/2013  Patient name: Christopher Wang Medical record number: [id number] Date of birth: Dec 03, 1985 Age: 27 y.o. Gender: male PCP: Lucrezia Starch, MD    Chief Complaint:  Intractable nausea vomiting, chills or shortness of breath yesterday   HPI: Patient is a 27 year old male with history of hypertension, chronic kidney disease, nephrotic syndrome, biopsy proven collapsing FSGS (idiopathic) in 2012, non HIV. Per Dr. Lorrene Reid, who knows the patient very well, he was supposed to be on investigational drug, Acthar, he took it for a month or so and then subsequently was not picking up prescriptions. He is extremely noncompliant, he saw Dr. Lorrene Reid in the office 2 weeks ago and at that time creatinine was 8.0. Patient was sent to vascular evaluation, was seen by Dr. Bridgett Larsson on 10/10 and was explained that he would need fistula/permanent access placement and dialysis.    Patient is in to the ER today with intractable nausea and vomiting since yesterday. Patient reports that he went to [employer] yesterday with his friends and had macaroni and chicken wings with iced tea. He reports he started having nausea, vomiting and abdominal discomfort after that. He denies similar symptoms in his friends. He denied any diarrhea or any hematemesis. He also reported that he felt short of breath with the episode of vomiting. He states that he still has normal urine output.   ER workup showed a BUN of 96, creatinine of 10.62 (was 8.0, 2 weeks ago), WBCs 10.0, neutrophils 92%, lipase 168 CT abdomen and pelvis showed a mild bilateral renal atrophic, enteritis of the proximal small bowel, however infiltrative disease such as lymphoma cannot be excluded.   Review of Systems:  Constitutional: Denies fever or diaphoresis, + chills , poor appetite and fatigue.  HEENT: Denies photophobia,  eye pain, redness, hearing loss, ear pain, congestion, sore throat, rhinorrhea, sneezing, mouth sores, trouble swallowing, neck pain, neck stiffness and tinnitus.   Respiratory: Denies cough, chest tightness,  and wheezing.   Cardiovascular: Denies chest pain, palpitations and leg swelling.  Gastrointestinal:  please see history of present illness  skeletal: Denies myalgias, back pain, joint swelling, arthralgias and gait problem.  Skin: Denies pallor, rash and wound.  Neurological: Denies dizziness, seizures, syncope, weakness, light-headedness, numbness and headaches.  Hematological: Denies adenopathy. Easy bruising, personal or family bleeding history  Psychiatric/Behavioral: Denies suicidal ideation, mood changes, confusion, nervousness, sleep disturbance and agitation  Past Medical History: Past Medical History  Diagnosis Date  . Anemia   . Nephrotic syndrome   . Hypertension 08/09/2011   Past Surgical History  Procedure Laterality Date  . Renal biopsy      Medications: Prior to Admission medications   Medication Sig Start Date End Date Taking? Authorizing Provider  amLODipine (NORVASC) 10 MG tablet Take 10 mg by mouth daily.   Yes Historical Provider, MD    Allergies:  No Known Allergies  Social History:  reports that he has been passively smoking Cigars.  He has never used smokeless tobacco. He reports that he drinks about 0.6 ounces of alcohol per week. He reports that he does not use illicit drugs.  Family History: Family History  Problem Relation Age of Onset  . Hypertension Mother     Physical Exam: Blood pressure 160/96, pulse 99, temperature 98.7 F (37.1 C), temperature source Oral, resp. rate 18, SpO2 100.00%. General: Alert, awake, oriented x3, in no acute distress. HEENT: normocephalic, atraumatic, anicteric sclera,  pink conjunctiva, PERLA, oropharynx clear Neck: supple, no masses or lymphadenopathy, no goiter, no bruits  Heart: Regular rate and rhythm,  without murmurs, rubs or gallops. Lungs: Clear to auscultation bilaterally, no wheezing, rales or rhonchi. Abdomen: Soft, nontender, nondistended, positive bowel sounds, no masses. Extremities: No clubbing, cyanosis or edema with positive pedal pulses. Neuro: Grossly intact, no focal neurological deficits, strength 5/5 upper and lower extremities bilaterally Psych: alert and oriented x 3, normal mood and affect Skin: no rashes or lesions, warm and dry   LABS on Admission:  Basic Metabolic Panel:  Recent Labs Lab 07/15/13 0610  NA 140  K 4.7  CL 99  CO2 20  GLUCOSE 112*  BUN 96*  CREATININE 10.62*  CALCIUM 7.3*   Liver Function Tests:  Recent Labs Lab 07/15/13 0610  AST 14  ALT 9  ALKPHOS 67  BILITOT 0.4  PROT 8.2  ALBUMIN 4.0    Recent Labs Lab 07/15/13 0610  LIPASE 168*   No results found for this basename: AMMONIA,  in the last 168 hours CBC:  Recent Labs Lab 07/15/13 0610  WBC 10.0  NEUTROABS 9.2*  HGB 11.2*  HCT 34.2*  MCV 87.5  PLT 203   Cardiac Enzymes: No results found for this basename: CKTOTAL, CKMB, CKMBINDEX, TROPONINI,  in the last 168 hours BNP: No components found with this basename: POCBNP,  CBG: No results found for this basename: GLUCAP,  in the last 168 hours   Radiological Exams on Admission: Ct Abdomen Pelvis Wo Contrast  07/15/2013   CLINICAL DATA:  Abdominal pain.  EXAM: CT ABDOMEN AND PELVIS WITHOUT CONTRAST  TECHNIQUE: Multidetector CT imaging of the abdomen and pelvis was performed following the standard protocol without intravenous contrast.  COMPARISON:  None.  FINDINGS: Visualized lung bases appear normal. No gallstones are noted. The liver, spleen and pancreas appeared appear normal. However, there is noted fluid around the spleen. The appendix appears normal. There is noted significant wall and fold thickening involving proximal small bowel consistent with enteritis or other inflammation. Free fluid is also noted in the  dependent portion of the pelvis. Urinary bladder appears normal. Adrenal glands appear normal. Mild bilateral renal atrophy is noted.  IMPRESSION: Mild bilateral renal atrophy. Severe wall and fold thickening of proximal small bowel is noted most consistent with inflammation or enteritis ; however infiltrative disease such as lymphoma cannot be excluded and clinical correlation is recommend. Small amount of free fluid is noted around the spleen as well as in the dependent portion of the pelvis.   Electronically Signed   By: Sabino Dick M.D.   On: 07/15/2013 10:11   Dg Chest 2 View  07/15/2013   CLINICAL DATA:  Shortness of breath  EXAM: CHEST  2 VIEW  COMPARISON:  Prior radiograph from 02/23/2012  FINDINGS: Cardiac and mediastinal silhouettes are stable in size and contour, and remain within normal limits.  Lungs are normally inflated without airspace consolidation, pleural effusion, or pulmonary edema. There is no pneumothorax.  Osseous structures are within normal limits.  IMPRESSION: No active cardiopulmonary disease.   Electronically Signed   By: Jeannine Boga M.D.   On: 07/15/2013 06:39    Assessment/Plan Principal Problem:   Acute on chronic renal insufficiency: Likely ESRD due to worsening of FSGS (focal segmental glomerulosclerosis) with nephrosis and severely noncompliant. - Patient will be admitted to [facility], discussed in detail with Dr. Lorrene Reid (nephrology), vascular surgery will need to be consulted as well.  - Continue IV  fluids, antiemetics, follow UA.  Patient still appears to be in denial about his disease.   Active Problems:   Hypertension: Continue amlodipine     Enteritis: Patient's symptoms may be related to uremia however he states that his symptoms started after eating at a restaurant yesterday. CT of the abdomen and pelvis showed small bowel enteritis, however rule out lymphoma. He should have a repeat CT of the abdomen and pelvis once his symptoms are  completely resolved and upon completion of antibiotic course.  - Placed him on ciprofloxacin and Flagyl with renal dosing -  continue IV fluids, clear liquid diet, antiemetics   DVT prophylaxis:  heparin subcutaneous   CODE STATUS:  Full CODE STATUS   Patient case was signed out to Dr Algis Liming. Further plan will depend as patient's clinical course evolves and further radiologic and laboratory data become available.   Time Spent on Admission: 1 hour  RAI,RIPUDEEP M.D. [facility] 07/15/2013, 11:38 AM Pager: [phone]  If 7PM-7AM, please contact night-coverage [email or web address] Password [email or web address]

## 2013-07-15 NOTE — ED Notes (Signed)
Attempted to start IV, pt would not lay on back or allow RN to assess veins. Attempted to stick once, will have another nurse look.

## 2013-07-15 NOTE — ED Notes (Signed)
Patient asked if he could have a popsicle, orange juice, and crackers. I told him "I would have to check with RN, Apolonio Schneiders."

## 2013-07-15 NOTE — ED Notes (Signed)
Per ems - pt from home, reports n/v since 1300 yesterday. Pt had called ems complaining of SOB - all lung fields clear, saO2 98% ra. bp 134/72, pulse 80, respirations 14,[id number]. EMS has not witnessed pt vomit. Pt reports to ems that he has "some type of kidney failure" and "doesn't take any medication for it". Pt a+o, in NAD. Ambulated from truck into ED room.

## 2013-07-16 DIAGNOSIS — D631 Anemia in chronic kidney disease: Secondary | ICD-10-CM

## 2013-07-16 DIAGNOSIS — I10 Essential (primary) hypertension: Secondary | ICD-10-CM

## 2013-07-16 LAB — CBC
HCT: 26 % — ABNORMAL LOW (ref 39.0–52.0)
Hemoglobin: 8.5 g/dL — ABNORMAL LOW (ref 13.0–17.0)
MCH: 28.6 pg (ref 26.0–34.0)
MCHC: 32.7 g/dL (ref 30.0–36.0)
MCV: 87.5 fL (ref 78.0–100.0)
Platelets: 147 10*3/uL — ABNORMAL LOW (ref 150–400)
RBC: 2.97 MIL/uL — ABNORMAL LOW (ref 4.22–5.81)
RDW: 13.5 % (ref 11.5–15.5)
WBC: 5.7 10*3/uL (ref 4.0–10.5)

## 2013-07-16 LAB — RENAL FUNCTION PANEL
Albumin: 3 g/dL — ABNORMAL LOW (ref 3.5–5.2)
BUN: 96 mg/dL — ABNORMAL HIGH (ref 6–23)
CO2: 21 mEq/L (ref 19–32)
Calcium: 7.1 mg/dL — ABNORMAL LOW (ref 8.4–10.5)
Chloride: 103 mEq/L (ref 96–112)
Creatinine, Ser: 11.78 mg/dL — ABNORMAL HIGH (ref 0.50–1.35)
GFR calc Af Amer: 6 mL/min — ABNORMAL LOW (ref >90)
GFR calc non Af Amer: 5 mL/min — ABNORMAL LOW (ref >90)
Glucose, Bld: 74 mg/dL (ref 70–99)
Phosphorus: 8.1 mg/dL — ABNORMAL HIGH (ref 2.3–4.6)
Potassium: 3.9 mEq/L (ref 3.5–5.1)
Sodium: 142 mEq/L (ref 135–145)

## 2013-07-16 LAB — MAGNESIUM: Magnesium: 2.2 mg/dL (ref 1.5–2.5)

## 2013-07-16 LAB — URINE CULTURE
Colony Count: NO GROWTH
Culture: NO GROWTH

## 2013-07-16 MED ORDER — CALCIUM ACETATE 667 MG PO CAPS
1334.0000 mg | ORAL_CAPSULE | Freq: Three times a day (TID) | ORAL | Status: DC
  Administered 2013-07-16 – 2013-07-22 (×11): 1334 mg via ORAL
  Filled 2013-07-16 (×20): qty 2

## 2013-07-16 MED ORDER — KIDNEY FAILURE BOOK
Freq: Once | Status: AC
  Administered 2013-07-16: 14:00:00
  Filled 2013-07-16: qty 1

## 2013-07-16 NOTE — Progress Notes (Addendum)
[facility] PROGRESS NOTE  Christopher Wang [id number] DOB: 1986-05-19 DOA: 07/15/2013 PCP: Lucrezia Starch, MD  HPI/Brief narrative 27 y.o. 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 Had severe nephrotic syndrome secondary to same. He has been non-compliant with BP meds, refused to take statins for hyperlipidemia, failed steroids, stopped using ACTHAR gel [place] which was provided to him free by the drug company for treatment of his nephrotic syndrome, and missed Nephrology follow up appointments. He was advised of near ESRD. He presented to the [facility] ED 10/17 with complaints of nausea, vomiting, abdominal discomfort (started after eating at and headache, as well as SOB. Eval in the ED included creatinine now up to 10, lipase 168, CTA/P c/w possible small bowel enteritis. Transferred to [facility] & admitted for further evaluation. Antibiotics have been started. Nephrology consulted.  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 - Vascular surgery input regarding access noted. - Patient requests second opinion regarding his kidney failure. Advised him that there is a single nephrology group at this hospital system and could request another nephrologist from the same group (which patient declines). Otherwise he will have to seek this opinion at some other facility. Patient seemed to understand but did not make any further comments. He stated that he was ready for dialysis if that's what his condition warrants. - Discussed with nephrology.  Abdominal pain/enteritis on CT - Patient denies sickly contacts or antibiotic use. - Improved. Advance diet as tolerated. - Continue empiric Cipro and Flagyl. - May need followup CT abdomen to ensure resolution of findings because question of lymphoma was raised on CT  Hypertension - Good inpatient  control. - History of noncompliance with medications. - Continue amlodipine and clonidine. - EKG shows sinus rhythm with LVH and repolarization abnormalities, prolonged QTC. Since patient had some complaints of dyspnea on admission, will check 2-D echo. Followup EKG. Continue monitoring on telemetry.  Anemia - Likely secondary to ESRD - Follow CBC in a.m. Transfuse if hemoglobin less than 7 g per DL.  - Not sure of his baseline hemoglobin but is probably lower than his presentation yesterday. Drop of hemoglobin from 11.2 yesterday to 8.5 today-? Dilutional. No overt bleeding.  - Management per nephrology: Aranesp etc..    Hyperlipidemia - Check fasting lipids.  Secondary hyperparathyroidism - Management per nephrology.   DVT prophylaxis: Heparin Lines/catheters: PIV Nutrition: Advance to renal diet as tolerated  Activity:  Up adlib Code Status: Full Family Communication: None Disposition Plan: Home when medically stable   Consultants:  Nephrology  Vascular surgery  Procedures:  None  Antibiotics:  IV Cipro 10/17 >  IV Flagyl 10/17 >   Subjective: Denied complaints. Denies nausea, vomiting, abdominal pain or diarrhea. Denies dyspnea.  Objective: Filed Vitals:   07/15/13 2045 07/15/13 2118 07/16/13 0546 07/16/13 0700  BP: 105/67 110/67 118/71 116/71  Pulse: 80 86 80 80  Temp: 98.5 F (36.9 C)  97.8 F (36.6 C) 98.3 F (36.8 C)  TempSrc: Oral  Oral Oral  Resp: 16  16 16   Height:      Weight:      SpO2: 100%  100% 100%    Intake/Output Summary (Last 24 hours) at 07/16/13 1139 Last data filed at 07/16/13 1138  Gross per 24 hour  Intake   1910 ml  Output   1100 ml  Net    810 ml   Filed Weights   07/15/13 1241  Weight: 75.705 kg (166 lb 14.4 oz)     Exam:  General exam: Moderately built and nourished male patient lying comfortably supine in bed. Engaged in his eye phone. Does not establish eye contact or engage in conversations. Answers some  questions.  Respiratory system: Clear. No increased work of breathing. Cardiovascular system: S1 & S2 heard, RRR. No JVD, murmurs, gallops, clicks or pedal edema. Telemetry: Sinus rhythm  Gastrointestinal system: Abdomen is nondistended, soft and nontender. Normal bowel sounds heard. Central nervous system: Alert and oriented. No focal neurological deficits. Extremities: Symmetric 5 x 5 power.   Data Reviewed: Basic Metabolic Panel:  Recent Labs Lab 07/15/13 0610 07/16/13 0618  NA 140 142  K 4.7 3.9  CL 99 103  CO2 20 21  GLUCOSE 112* 74  BUN 96* 96*  CREATININE 10.62* 11.78*  CALCIUM 7.3* 7.1*  PHOS  --  8.1*   Liver Function Tests:  Recent Labs Lab 07/15/13 0610 07/16/13 0618  AST 14  --   ALT 9  --   ALKPHOS 67  --   BILITOT 0.4  --   PROT 8.2  --   ALBUMIN 4.0 3.0*    Recent Labs Lab 07/15/13 0610  LIPASE 168*   No results found for this basename: AMMONIA,  in the last 168 hours CBC:  Recent Labs Lab 07/15/13 0610 07/16/13 0618  WBC 10.0 5.7  NEUTROABS 9.2*  --   HGB 11.2* 8.5*  HCT 34.2* 26.0*  MCV 87.5 87.5  PLT 203 147*   Cardiac Enzymes: No results found for this basename: CKTOTAL, CKMB, CKMBINDEX, TROPONINI,  in the last 168 hours BNP (last 3 results) No results found for this basename: PROBNP,  in the last 8760 hours CBG: No results found for this basename: GLUCAP,  in the last 168 hours  No results found for this or any previous visit (from the past 240 hour(s)).    Additional labs: 1. None     Studies: Ct Abdomen Pelvis Wo Contrast  07/15/2013   CLINICAL DATA:  Abdominal pain.  EXAM: CT ABDOMEN AND PELVIS WITHOUT CONTRAST  TECHNIQUE: Multidetector CT imaging of the abdomen and pelvis was performed following the standard protocol without intravenous contrast.  COMPARISON:  None.  FINDINGS: Visualized lung bases appear normal. No gallstones are noted. The liver, spleen and pancreas appeared appear normal. However, there is noted  fluid around the spleen. The appendix appears normal. There is noted significant wall and fold thickening involving proximal small bowel consistent with enteritis or other inflammation. Free fluid is also noted in the dependent portion of the pelvis. Urinary bladder appears normal. Adrenal glands appear normal. Mild bilateral renal atrophy is noted.  IMPRESSION: Mild bilateral renal atrophy. Severe wall and fold thickening of proximal small bowel is noted most consistent with inflammation or enteritis ; however infiltrative disease such as lymphoma cannot be excluded and clinical correlation is recommend. Small amount of free fluid is noted around the spleen as well as in the dependent portion of the pelvis.   Electronically Signed   By: Sabino Dick M.D.   On: 07/15/2013 10:11   Dg Chest 2 View  07/15/2013   CLINICAL DATA:  Shortness of breath  EXAM: CHEST  2 VIEW  COMPARISON:  Prior radiograph from 02/23/2012  FINDINGS: Cardiac and mediastinal silhouettes are stable in size and contour, and remain within normal limits.  Lungs are normally inflated without airspace consolidation, pleural effusion, or pulmonary edema. There is no pneumothorax.  Osseous structures  are within normal limits.  IMPRESSION: No active cardiopulmonary disease.   Electronically Signed   By: Jeannine Boga M.D.   On: 07/15/2013 06:39        Scheduled Meds: . amLODipine  10 mg Oral Daily  . ciprofloxacin  400 mg Intravenous Q24H  . cloNIDine  0.2 mg Oral BID  . fentaNYL  50 mcg Intravenous Once  . heparin  5,000 Units Subcutaneous Q8H  . metronidazole  500 mg Intravenous Q8H  . sodium chloride  3 mL Intravenous Q12H   Continuous Infusions: . sodium chloride 50 mL/hr at 07/15/13 1314    Principal Problem:   Acute on chronic renal insufficiency Active Problems:   Hyperlipidemia   Hypertension   FSGS (focal segmental glomerulosclerosis) with nephrosis   Uremia   Enteritis    Time spent: 69  minutes    HONGALGI,ANAND, MD, FACP, FHM. [facility] Pager [phone]  If 7PM-7AM, please contact night-coverage [email or web address] Password [email or web address] 07/16/2013, 11:39 AM    LOS: 1 day

## 2013-07-16 NOTE — Progress Notes (Signed)
Spoke with Dr. Harl Bowie MD patient is scheduled for AVG/AVF placement on Monday not Saturday. Jakye Mullens Joselita,RN

## 2013-07-16 NOTE — Progress Notes (Signed)
Patient ID: Christopher Wang, male   DOB: [birth year], 27 y.o.   MRN: [id number] The patient is more comfortable this morning. Very difficult to engage in conversation but does answer questions and occasionally ask questions. Again explained our role and access. I explained the need for acute access with a dialysis catheter and long-term access with a left arm access. He does have poor surface veins by physical exam and by ultrasound. I explained the options for AV fistula an AV graft and the certainty that these will fail over time and require revision. Virtually his potassium is 3.9 he appears to be in no respiratory distress. Planning for surgery on Monday at 0 7:30. Available for acute access if this becomes necessary over the weekend. The patient states this morning that he would like a second opinion regarding need for dialysis. I explained that he can discuss this with the nephrology service when they see him today.

## 2013-07-16 NOTE — Progress Notes (Addendum)
Subjective:  Has been difficult to engage with various MD's since admission - this has been typical for him over the past 1.5+ years I have dealt with him Tends to make little eye contact, has not taken his illness seriously - yet now voices to some MD's that he needs another opinion. At this point in time there is nothing else that can be done to salvage his kidneys He is scheduled for vascular access and dialysis on Monday He will not make eye contact with me at all today, but does say he will watch the videos and go ahead with HD  Objective Vital signs in last 24 hours: Filed Vitals:   07/15/13 2045 07/15/13 2118 07/16/13 0546 07/16/13 0700  BP: 105/67 110/67 118/71 116/71  Pulse: 80 86 80 80  Temp: 98.5 F (36.9 C)  97.8 F (36.6 C) 98.3 F (36.8 C)  TempSrc: Oral  Oral Oral  Resp: 16  16 16   Height:      Weight:      SpO2: 100%  100% 100%   Weight change:   Intake/Output Summary (Last 24 hours) at 07/16/13 1214 Last data filed at 07/16/13 1138  Gross per 24 hour  Intake   1910 ml  Output   1100 ml  Net    810 ml   Physical Exam:  Blood pressure 116/71, pulse 80, temperature 98.3 F (36.8 C), temperature source Oral, resp. rate 16, height 5\' 11"  (1.803 m), weight 75.705 kg (166 lb 14.4 oz), SpO2 100.00%. Flat affect, no eye contact Lungs clear No pericardial rub Abdomen not tender now No sig edema  Labs: Basic Metabolic Panel:  Recent Labs Lab 07/15/13 0610 07/16/13 0618  NA 140 142  K 4.7 3.9  CL 99 103  CO2 20 21  GLUCOSE 112* 74  BUN 96* 96*  CREATININE 10.62* 11.78*  CALCIUM 7.3* 7.1*  PHOS  --  8.1*   Liver Function Tests:  Recent Labs Lab 07/15/13 0610 07/16/13 0618  AST 14  --   ALT 9  --   ALKPHOS 67  --   BILITOT 0.4  --   PROT 8.2  --   ALBUMIN 4.0 3.0*    Recent Labs Lab 07/15/13 0610  LIPASE 168*  CBC:  Recent Labs Lab 07/15/13 0610 07/16/13 0618  WBC 10.0 5.7  NEUTROABS 9.2*  --   HGB 11.2* 8.5*  HCT 34.2* 26.0*   MCV 87.5 87.5  PLT 203 147*   Studies/Results: Ct Abdomen Pelvis Wo Contrast  07/15/2013   CLINICAL DATA:  Abdominal pain.  EXAM: CT ABDOMEN AND PELVIS WITHOUT CONTRAST  TECHNIQUE: Multidetector CT imaging of the abdomen and pelvis was performed following the standard protocol without intravenous contrast.  COMPARISON:  None.  FINDINGS: Visualized lung bases appear normal. No gallstones are noted. The liver, spleen and pancreas appeared appear normal. However, there is noted fluid around the spleen. The appendix appears normal. There is noted significant wall and fold thickening involving proximal small bowel consistent with enteritis or other inflammation. Free fluid is also noted in the dependent portion of the pelvis. Urinary bladder appears normal. Adrenal glands appear normal. Mild bilateral renal atrophy is noted.  IMPRESSION: Mild bilateral renal atrophy. Severe wall and fold thickening of proximal small bowel is noted most consistent with inflammation or enteritis ; however infiltrative disease such as lymphoma cannot be excluded and clinical correlation is recommend. Small amount of free fluid is noted around the spleen as well as in the dependent  portion of the pelvis.   Electronically Signed   By: Sabino Dick M.D.   On: 07/15/2013 10:11   Dg Chest 2 View  07/15/2013   CLINICAL DATA:  Shortness of breath  EXAM: CHEST  2 VIEW  COMPARISON:  Prior radiograph from 02/23/2012  FINDINGS: Cardiac and mediastinal silhouettes are stable in size and contour, and remain within normal limits.  Lungs are normally inflated without airspace consolidation, pleural effusion, or pulmonary edema. There is no pneumothorax.  Osseous structures are within normal limits.  IMPRESSION: No active cardiopulmonary disease.   Electronically Signed   By: Jeannine Boga M.D.   On: 07/15/2013 06:39   Medications:   . amLODipine  10 mg Oral Daily  . ciprofloxacin  400 mg Intravenous Q24H  . cloNIDine  0.2 mg Oral  BID  . fentaNYL  50 mcg Intravenous Once  . heparin  5,000 Units Subcutaneous Q8H  . metronidazole  500 mg Intravenous Q8H  . sodium chloride  3 mL Intravenous Q12H    I  have reviewed scheduled and prn medications.  Impression/Plan  1. 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 [facility] has been consulted for catheter and AVF. Start dialysis after catheter placement on Monday. Agrees to watch dialysis videos.  Educational materials to be provided. 2. Abdominal pain - changes of enteritis on CT. Lipase also elevated. On ATBS per primary. Afebrile in cipro and flagyl. WILL need another CT at some point due to comment about question of lymphoma by radiology. Recheck lipase re ? Of pancreatitis. 3. HTN - historically poor control due to non-compliance. Continue meds. Added clonidine. BP now controlled 4. Hyperlipidemia - need f/u lipid panel (pending) and will likely need statin once gi issues straightened out 5. Secondary hyperparathyroidism - PTH was 572 on 07/05/13. Start hectorol when HD starts Start binders for elev phos phoslo 2ac 6. Uninsured status - will need to be seen by financial counselor as he will be unable to go on outpt HD without insurance. Emergency Medicaid application would be appropriate. I have placed consult.   Jamal Maes, MD [facility] [phone] pager 07/16/2013, 12:14 PM

## 2013-07-17 DIAGNOSIS — D649 Anemia, unspecified: Secondary | ICD-10-CM

## 2013-07-17 DIAGNOSIS — I369 Nonrheumatic tricuspid valve disorder, unspecified: Secondary | ICD-10-CM

## 2013-07-17 LAB — LIPID PANEL
Cholesterol: 191 mg/dL (ref 0–200)
HDL: 50 mg/dL (ref >39)
LDL Cholesterol: 105 mg/dL — ABNORMAL HIGH (ref 0–99)
Total CHOL/HDL Ratio: 3.8 RATIO
Triglycerides: 178 mg/dL — ABNORMAL HIGH (ref <150)
VLDL: 36 mg/dL (ref 0–40)

## 2013-07-17 LAB — CBC
HCT: 26.4 % — ABNORMAL LOW (ref 39.0–52.0)
Hemoglobin: 8.7 g/dL — ABNORMAL LOW (ref 13.0–17.0)
MCH: 28.5 pg (ref 26.0–34.0)
MCHC: 33 g/dL (ref 30.0–36.0)
MCV: 86.6 fL (ref 78.0–100.0)
Platelets: 144 10*3/uL — ABNORMAL LOW (ref 150–400)
RBC: 3.05 MIL/uL — ABNORMAL LOW (ref 4.22–5.81)
RDW: 13 % (ref 11.5–15.5)
WBC: 5.4 10*3/uL (ref 4.0–10.5)

## 2013-07-17 LAB — RENAL FUNCTION PANEL
Albumin: 3.1 g/dL — ABNORMAL LOW (ref 3.5–5.2)
BUN: 97 mg/dL — ABNORMAL HIGH (ref 6–23)
CO2: 22 mEq/L (ref 19–32)
Calcium: 7.1 mg/dL — ABNORMAL LOW (ref 8.4–10.5)
Chloride: 101 mEq/L (ref 96–112)
Creatinine, Ser: 12.3 mg/dL — ABNORMAL HIGH (ref 0.50–1.35)
GFR calc Af Amer: 6 mL/min — ABNORMAL LOW (ref >90)
GFR calc non Af Amer: 5 mL/min — ABNORMAL LOW (ref >90)
Glucose, Bld: 100 mg/dL — ABNORMAL HIGH (ref 70–99)
Phosphorus: 7 mg/dL — ABNORMAL HIGH (ref 2.3–4.6)
Potassium: 4 mEq/L (ref 3.5–5.1)
Sodium: 139 mEq/L (ref 135–145)

## 2013-07-17 LAB — LIPASE, BLOOD: Lipase: 43 U/L (ref 11–59)

## 2013-07-17 MED ORDER — METRONIDAZOLE 500 MG PO TABS
500.0000 mg | ORAL_TABLET | Freq: Three times a day (TID) | ORAL | Status: DC
  Administered 2013-07-17 – 2013-07-22 (×12): 500 mg via ORAL
  Filled 2013-07-17 (×17): qty 1

## 2013-07-17 MED ORDER — CIPROFLOXACIN HCL 500 MG PO TABS
500.0000 mg | ORAL_TABLET | Freq: Every day | ORAL | Status: DC
  Administered 2013-07-19 – 2013-07-22 (×4): 500 mg via ORAL
  Filled 2013-07-17 (×7): qty 1

## 2013-07-17 NOTE — Progress Notes (Signed)
[facility] PROGRESS NOTE  Christopher Wang [id number] DOB: [birth year] DOA: 07/15/2013 PCP: Lucrezia Starch, MD  HPI/Brief narrative 27 y.o. 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 Had severe nephrotic syndrome secondary to same. He has been non-compliant with BP meds, refused to take statins for hyperlipidemia, failed steroids, stopped using ACTHAR gel Livengood which was provided to him free by the drug company for treatment of his nephrotic syndrome, and missed Nephrology follow up appointments. He was advised of near ESRD. He presented to the [facility] ED 10/17 with complaints of nausea, vomiting, abdominal discomfort (started after eating at and headache, as well as SOB. Eval in the ED included creatinine now up to 10, lipase 168, CTA/P c/w possible small bowel enteritis. Transferred to [facility] & admitted for further evaluation. Antibiotics have been started. Nephrology consulted.  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 - Vascular surgery input regarding access noted. - Patient was more receptive to conversation this morning and counseled him extensively re ongoing medical care and answered questions.   Abdominal pain/enteritis on CT - Patient denies sickly contacts or antibiotic use. - Improved.  - Continue empiric Cipro and Flagyl - will change to PO. - Tolerating diet - May need followup CT abdomen to ensure resolution of findings because question of lymphoma was raised on CT  Hypertension - Good inpatient control. - History of noncompliance with medications. - Continue amlodipine and clonidine. - EKG shows sinus rhythm with LVH and repolarization abnormalities, prolonged QTC. Since patient had some complaints of dyspnea on admission, will check 2-D echo. Followup EKG. Continue monitoring on telemetry.  Anemia - Likely  secondary to ESRD - Transfuse if hemoglobin less than 7 g per DL.  - Not sure of his baseline hemoglobin but is probably lower than his presentation yesterday. Drop of hemoglobin from 11.2 yesterday to 8.5 today-? Dilutional. No overt bleeding. Stable - Management per nephrology: Aranesp etc..    Hyperlipidemia - Check fasting lipids.  Secondary hyperparathyroidism - Management per nephrology.   DVT prophylaxis: Heparin Lines/catheters: PIV Nutrition: renal diet  Activity:  Up adlib Code Status: Full Family Communication: None Disposition Plan: Home when medically stable   Consultants:  Nephrology  Vascular surgery  Procedures:  None  Antibiotics:  IV Cipro 10/17 >  IV Flagyl 10/17 >   Subjective: Denied complaints. Tolerating diet without complaints.  Objective: Filed Vitals:   07/16/13 1158 07/16/13 1848 07/16/13 2118 07/17/13 0458  BP: 96/65 113/66 112/63 113/71  Pulse: 76 85 79 75  Temp:  97.9 F (36.6 C) 98.1 F (36.7 C) 97.6 F (36.4 C)  TempSrc:  Oral Oral Oral  Resp:  20 18 16   Height:      Weight:   77.8 kg (171 lb 8.3 oz)   SpO2:  97% 100% 100%    Intake/Output Summary (Last 24 hours) at 07/17/13 1410 Last data filed at 07/16/13 1800  Gross per 24 hour  Intake    240 ml  Output    500 ml  Net   -260 ml   Filed Weights   07/15/13 1241 07/16/13 2118  Weight: 75.705 kg (166 lb 14.4 oz) 77.8 kg (171 lb 8.3 oz)     Exam:  General exam: Moderately built and nourished male patient lying comfortably supine in bed. More engaged in conversation this morning and asked a few questions. Respiratory system: Clear. No increased work of breathing. Cardiovascular system: S1 &  S2 heard, RRR. No JVD, murmurs, gallops, clicks or pedal edema. Telemetry: Sinus rhythm  Gastrointestinal system: Abdomen is nondistended, soft and nontender. Normal bowel sounds heard. Central nervous system: Alert and oriented. No focal neurological deficits. Extremities:  Symmetric 5 x 5 power.   Data Reviewed: Basic Metabolic Panel:  Recent Labs Lab 07/15/13 0610 07/16/13 0618  NA 140 142  K 4.7 3.9  CL 99 103  CO2 20 21  GLUCOSE 112* 74  BUN 96* 96*  CREATININE 10.62* 11.78*  CALCIUM 7.3* 7.1*  MG  --  2.2  PHOS  --  8.1*   Liver Function Tests:  Recent Labs Lab 07/15/13 0610 07/16/13 0618  AST 14  --   ALT 9  --   ALKPHOS 67  --   BILITOT 0.4  --   PROT 8.2  --   ALBUMIN 4.0 3.0*    Recent Labs Lab 07/15/13 0610  LIPASE 168*   No results found for this basename: AMMONIA,  in the last 168 hours CBC:  Recent Labs Lab 07/15/13 0610 07/16/13 0618 07/17/13 1240  WBC 10.0 5.7 5.4  NEUTROABS 9.2*  --   --   HGB 11.2* 8.5* 8.7*  HCT 34.2* 26.0* 26.4*  MCV 87.5 87.5 86.6  PLT 203 147* 144*   Cardiac Enzymes: No results found for this basename: CKTOTAL, CKMB, CKMBINDEX, TROPONINI,  in the last 168 hours BNP (last 3 results) No results found for this basename: PROBNP,  in the last 8760 hours CBG: No results found for this basename: GLUCAP,  in the last 168 hours  Recent Results (from the past 240 hour(s))  URINE CULTURE     Status: None   Collection Time    07/15/13 10:52 AM      Result Value Range Status   Specimen Description URINE, CLEAN CATCH   Final   Special Requests NONE   Final   Culture  Setup Time     Final   Value: 07/15/2013 14:54     Performed at [facility]     Final   Value: NO GROWTH     Performed at [place]     Final   Value: NO GROWTH     Performed at [facility]   Report Status 07/16/2013 FINAL   Final      Additional labs: 1. None     Studies: No results found.      Scheduled Meds: . amLODipine  10 mg Oral Daily  . calcium acetate  1,334 mg Oral TID WC  . ciprofloxacin  400 mg Intravenous Q24H  . cloNIDine  0.2 mg Oral BID  . fentaNYL  50 mcg Intravenous Once  . heparin  5,000 Units Subcutaneous Q8H  . metronidazole   500 mg Intravenous Q8H  . sodium chloride  3 mL Intravenous Q12H   Continuous Infusions:    Principal Problem:   ESRD needing dialysis Active Problems:   Hyperlipidemia   Hypertension   FSGS (focal segmental glomerulosclerosis) with nephrosis   Uremia   Enteritis   Anemia in chronic kidney disease    Time spent: 60 minutes    Cordarrell Sane, MD, FACP, FHM. [facility] Pager [phone]  If 7PM-7AM, please contact night-coverage [email or web address] Password [email or web address] 07/17/2013, 2:10 PM    LOS: 2 days

## 2013-07-17 NOTE — Progress Notes (Signed)
Echocardiogram 2D Echocardiogram has been performed.  Doyle Askew 07/17/2013, 2:49 PM

## 2013-07-17 NOTE — Progress Notes (Signed)
Pt was given educational handouts and a booklet on kidney failure/dialysis. Pt was given the number to the videos on kidney failure and I explained to him how to watch the videos. He stated he understood how to start the videos up. I asked him if he was ready to watch them I would go ahead and start them. He stated he has a lot on his mind right now and that he would watch them when he got ready and read the material. Will f/u.

## 2013-07-17 NOTE — Progress Notes (Signed)
Subjective:  Curled up in a ball, lights off, poor eye contact Has agreed to proceed with HD access in the AM Orders written for first HD after Order placed for financial counselor to see  Objective Vital signs in last 24 hours: Filed Vitals:   07/16/13 1158 07/16/13 1848 07/16/13 2118 07/17/13 0458  BP: 96/65 113/66 112/63 113/71  Pulse: 76 85 79 75  Temp:  97.9 F (36.6 C) 98.1 F (36.7 C) 97.6 F (36.4 C)  TempSrc:  Oral Oral Oral  Resp:  20 18 16   Height:      Weight:   77.8 kg (171 lb 8.3 oz)   SpO2:  97% 100% 100%   Weight change: 2.095 kg (4 lb 9.9 oz)  Intake/Output Summary (Last 24 hours) at 07/17/13 1204 Last data filed at 07/16/13 1800  Gross per 24 hour  Intake    240 ml  Output    500 ml  Net   -260 ml   Physical Exam:  Blood pressure 116/71, pulse 80, temperature 98.3 F (36.8 C), temperature source Oral, resp. rate 16, height 5\' 11"  (1.803 m), weight 75.705 kg (166 lb 14.4 oz), SpO2 100.00%. Flat affect, no eye contact Lungs clear No pericardial rub Abdomen non tender No sig edema  Labs: Basic Metabolic Panel:  Recent Labs Lab 07/15/13 0610 07/16/13 0618  NA 140 142  K 4.7 3.9  CL 99 103  CO2 20 21  GLUCOSE 112* 74  BUN 96* 96*  CREATININE 10.62* 11.78*  CALCIUM 7.3* 7.1*  PHOS  --  8.1*   Liver Function Tests:  Recent Labs Lab 07/15/13 0610 07/16/13 0618  AST 14  --   ALT 9  --   ALKPHOS 67  --   BILITOT 0.4  --   PROT 8.2  --   ALBUMIN 4.0 3.0*    Recent Labs Lab 07/15/13 0610  LIPASE 168*  CBC:  Recent Labs Lab 07/15/13 0610 07/16/13 0618  WBC 10.0 5.7  NEUTROABS 9.2*  --   HGB 11.2* 8.5*  HCT 34.2* 26.0*  MCV 87.5 87.5  PLT 203 147*   Studies/Results: No results found. Medications:   . amLODipine  10 mg Oral Daily  . calcium acetate  1,334 mg Oral TID WC  . ciprofloxacin  400 mg Intravenous Q24H  . cloNIDine  0.2 mg Oral BID  . fentaNYL  50 mcg Intravenous Once  . heparin  5,000 Units Subcutaneous Q8H   . metronidazole  500 mg Intravenous Q8H  . sodium chloride  3 mL Intravenous Q12H    I  have reviewed scheduled and prn medications.  Impression/Plan  1. 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 [facility] plans for catheter and pernabenent access in the AM withdialysis after catheter placement on Monday. Agreed to watch dialysis videos.  Educational materials to be provided. CLIP. Financial counselor to see for emergency Medicaid. 2. Abdominal pain - changes of enteritis on CT. Lipase also elevated. On ATBS per primary. Afebrile in cipro and flagyl. WILL need another CT at some point due to comment about question of lymphoma by radiology. Recheck lipase re ? Of pancreatitis (pending). 3. HTN - historically poor control due to non-compliance. Continue meds. BP now controlled 4. Hyperlipidemia - need f/u lipid panel (pending) and will likely need statin once gi issues straightened out 5. Secondary hyperparathyroidism - PTH was 572 on 07/05/13. Start hectorol when HD starts Started binders for elev phos phoslo 2ac  6. Uninsured status - will need to be seen by financial counselor as he will be unable to go on outpt HD without insurance. Emergency Medicaid application would be appropriate. I have placed consult.   Christopher Maes, MD [facility] [phone] pager 07/17/2013, 12:04 PM

## 2013-07-17 NOTE — Progress Notes (Signed)
Subjective: Interval History: none.Marland Kitchen again discussed need for short and long-term dialysis access  Objective: Vital signs in last 24 hours: Temp:  [97.6 F (36.4 C)-98.1 F (36.7 C)] 97.6 F (36.4 C) (10/19 0458) Pulse Rate:  [75-85] 75 (10/19 0458) Resp:  [16-20] 16 (10/19 0458) BP: (96-113)/(63-71) 113/71 mmHg (10/19 0458) SpO2:  [97 %-100 %] 100 % (10/19 0458) Weight:  [171 lb 8.3 oz (77.8 kg)] 171 lb 8.3 oz (77.8 kg) (10/18 2118)  Intake/Output from previous day: 10/18 0701 - 10/19 0700 In: 360 [P.O.:360] Out: 1175 [Urine:1175] Intake/Output this shift:     no change.. mull surface veins with palpable radial pulse  Lab Results:  Recent Labs  07/15/13 0610 07/16/13 0618  WBC 10.0 5.7  HGB 11.2* 8.5*  HCT 34.2* 26.0*  PLT 203 147*   BMET  Recent Labs  07/15/13 0610 07/16/13 0618  NA 140 142  K 4.7 3.9  CL 99 103  CO2 20 21  GLUCOSE 112* 74  BUN 96* 96*  CREATININE 10.62* 11.78*  CALCIUM 7.3* 7.1*    Studies/Results: Ct Abdomen Pelvis Wo Contrast  07/15/2013   CLINICAL DATA:  Abdominal pain.  EXAM: CT ABDOMEN AND PELVIS WITHOUT CONTRAST  TECHNIQUE: Multidetector CT imaging of the abdomen and pelvis was performed following the standard protocol without intravenous contrast.  COMPARISON:  None.  FINDINGS: Visualized lung bases appear normal. No gallstones are noted. The liver, spleen and pancreas appeared appear normal. However, there is noted fluid around the spleen. The appendix appears normal. There is noted significant wall and fold thickening involving proximal small bowel consistent with enteritis or other inflammation. Free fluid is also noted in the dependent portion of the pelvis. Urinary bladder appears normal. Adrenal glands appear normal. Mild bilateral renal atrophy is noted.  IMPRESSION: Mild bilateral renal atrophy. Severe wall and fold thickening of proximal small bowel is noted most consistent with inflammation or enteritis ; however infiltrative  disease such as lymphoma cannot be excluded and clinical correlation is recommend. Small amount of free fluid is noted around the spleen as well as in the dependent portion of the pelvis.   Electronically Signed   By: Sabino Dick M.D.   On: 07/15/2013 10:11   Dg Chest 2 View  07/15/2013   CLINICAL DATA:  Shortness of breath  EXAM: CHEST  2 VIEW  COMPARISON:  Prior radiograph from 02/23/2012  FINDINGS: Cardiac and mediastinal silhouettes are stable in size and contour, and remain within normal limits.  Lungs are normally inflated without airspace consolidation, pleural effusion, or pulmonary edema. There is no pneumothorax.  Osseous structures are within normal limits.  IMPRESSION: No active cardiopulmonary disease.   Electronically Signed   By: Jeannine Boga M.D.   On: 07/15/2013 06:39   Anti-infectives: Anti-infectives   Start     Dose/Rate Route Frequency Ordered Stop   07/15/13 1200  metroNIDAZOLE (FLAGYL) IVPB 500 mg     500 mg 100 mL/hr over 60 Minutes Intravenous Every 8 hours 07/15/13 1147     07/15/13 1200  ciprofloxacin (CIPRO) IVPB 400 mg     400 mg 200 mL/hr over 60 Minutes Intravenous Every 24 hours 07/15/13 1147        Assessment/Plan: s/p Procedure(s): ARTERIOVENOUS (AV) FISTULA CREATION / LEFT ARM VS INSERTION OF LEFT ARM AVGG (Left) INSERTION OF DIALYSIS CATHETER (N/A) Again discussed the need for hemodialysis access. This is for 0 7:30 in the morning. Patient states his understanding and willingness to proceed   LOS:  2 days   EARLY, TODD 07/17/2013, 8:54 AM

## 2013-07-18 ENCOUNTER — Inpatient Hospital Stay (HOSPITAL_COMMUNITY): Payer: Medicaid Other | Admitting: Certified Registered"

## 2013-07-18 ENCOUNTER — Inpatient Hospital Stay (HOSPITAL_COMMUNITY): Payer: Medicaid Other

## 2013-07-18 ENCOUNTER — Encounter (HOSPITAL_COMMUNITY): Payer: Self-pay | Admitting: Certified Registered"

## 2013-07-18 ENCOUNTER — Telehealth: Payer: Self-pay | Admitting: Vascular Surgery

## 2013-07-18 ENCOUNTER — Encounter (HOSPITAL_COMMUNITY)
Admission: EM | Disposition: A | Discharge status: Home / Self Care | Payer: Self-pay | Source: Home / Self Care | Attending: Internal Medicine

## 2013-07-18 ENCOUNTER — Encounter (HOSPITAL_COMMUNITY): Payer: Medicaid Other | Admitting: Certified Registered"

## 2013-07-18 DIAGNOSIS — I5022 Chronic systolic (congestive) heart failure: Secondary | ICD-10-CM

## 2013-07-18 DIAGNOSIS — I509 Heart failure, unspecified: Secondary | ICD-10-CM

## 2013-07-18 HISTORY — PX: AV FISTULA PLACEMENT: SHX1204

## 2013-07-18 HISTORY — PX: INSERTION OF DIALYSIS CATHETER: SHX1324

## 2013-07-18 LAB — CBC
HCT: 25.5 % — ABNORMAL LOW (ref 39.0–52.0)
Hemoglobin: 8.4 g/dL — ABNORMAL LOW (ref 13.0–17.0)
MCH: 28.6 pg (ref 26.0–34.0)
MCHC: 32.9 g/dL (ref 30.0–36.0)
MCV: 86.7 fL (ref 78.0–100.0)
Platelets: 161 10*3/uL (ref 150–400)
RBC: 2.94 MIL/uL — ABNORMAL LOW (ref 4.22–5.81)
RDW: 12.8 % (ref 11.5–15.5)
WBC: 5 10*3/uL (ref 4.0–10.5)

## 2013-07-18 LAB — RENAL FUNCTION PANEL
Albumin: 3.1 g/dL — ABNORMAL LOW (ref 3.5–5.2)
BUN: 98 mg/dL — ABNORMAL HIGH (ref 6–23)
CO2: 22 mEq/L (ref 19–32)
Calcium: 7 mg/dL — ABNORMAL LOW (ref 8.4–10.5)
Chloride: 101 mEq/L (ref 96–112)
Creatinine, Ser: 12.16 mg/dL — ABNORMAL HIGH (ref 0.50–1.35)
GFR calc Af Amer: 6 mL/min — ABNORMAL LOW (ref >90)
GFR calc non Af Amer: 5 mL/min — ABNORMAL LOW (ref >90)
Glucose, Bld: 87 mg/dL (ref 70–99)
Phosphorus: 7.4 mg/dL — ABNORMAL HIGH (ref 2.3–4.6)
Potassium: 3.7 mEq/L (ref 3.5–5.1)
Sodium: 139 mEq/L (ref 135–145)

## 2013-07-18 SURGERY — ARTERIOVENOUS (AV) FISTULA CREATION
Anesthesia: General | Site: Neck | Laterality: Right | Wound class: Clean

## 2013-07-18 MED ORDER — DARBEPOETIN ALFA-POLYSORBATE 60 MCG/0.3ML IJ SOLN
60.0000 ug | INTRAMUSCULAR | Status: DC
  Administered 2013-07-18: 60 ug via INTRAVENOUS
  Filled 2013-07-18: qty 0.3

## 2013-07-18 MED ORDER — FENTANYL CITRATE 0.05 MG/ML IJ SOLN
INTRAMUSCULAR | Status: DC | PRN
  Administered 2013-07-18: 100 ug via INTRAVENOUS

## 2013-07-18 MED ORDER — ONDANSETRON HCL 4 MG/2ML IJ SOLN
INTRAMUSCULAR | Status: DC | PRN
  Administered 2013-07-18: 4 mg via INTRAMUSCULAR

## 2013-07-18 MED ORDER — PHENYLEPHRINE HCL 10 MG/ML IJ SOLN
INTRAMUSCULAR | Status: DC | PRN
  Administered 2013-07-18 (×2): 80 ug via INTRAVENOUS

## 2013-07-18 MED ORDER — CEFUROXIME SODIUM 1.5 G IJ SOLR
INTRAMUSCULAR | Status: AC
  Administered 2013-07-18: 1.5 g via INTRAMUSCULAR
  Filled 2013-07-18: qty 1.5

## 2013-07-18 MED ORDER — HEPARIN SODIUM (PORCINE) 1000 UNIT/ML DIALYSIS
20.0000 [IU]/kg | INTRAMUSCULAR | Status: DC | PRN
  Administered 2013-07-18 – 2013-07-19 (×2): 1600 [IU] via INTRAVENOUS_CENTRAL
  Filled 2013-07-18: qty 2

## 2013-07-18 MED ORDER — LIDOCAINE HCL (CARDIAC) 20 MG/ML IV SOLN
INTRAVENOUS | Status: DC | PRN
  Administered 2013-07-18: 100 mg via INTRAVENOUS

## 2013-07-18 MED ORDER — MIDAZOLAM HCL 5 MG/5ML IJ SOLN
INTRAMUSCULAR | Status: DC | PRN
  Administered 2013-07-18: 2 mg via INTRAVENOUS

## 2013-07-18 MED ORDER — LIDOCAINE-EPINEPHRINE 0.5 %-1:200000 IJ SOLN
INTRAMUSCULAR | Status: AC
  Filled 2013-07-18: qty 2

## 2013-07-18 MED ORDER — LIDOCAINE-EPINEPHRINE (PF) 1 %-1:200000 IJ SOLN
INTRAMUSCULAR | Status: AC
  Filled 2013-07-18: qty 10

## 2013-07-18 MED ORDER — DARBEPOETIN ALFA-POLYSORBATE 60 MCG/0.3ML IJ SOLN
INTRAMUSCULAR | Status: AC
  Filled 2013-07-18: qty 0.3

## 2013-07-18 MED ORDER — 0.9 % SODIUM CHLORIDE (POUR BTL) OPTIME
TOPICAL | Status: DC | PRN
  Administered 2013-07-18: 1000 mL

## 2013-07-18 MED ORDER — HYDROMORPHONE HCL PF 1 MG/ML IJ SOLN
INTRAMUSCULAR | Status: AC
  Filled 2013-07-18: qty 1

## 2013-07-18 MED ORDER — HEPARIN SODIUM (PORCINE) 1000 UNIT/ML IJ SOLN
INTRAMUSCULAR | Status: AC
  Filled 2013-07-18: qty 1

## 2013-07-18 MED ORDER — SODIUM CHLORIDE 0.9 % IR SOLN
Status: DC | PRN
  Administered 2013-07-18: 08:00:00

## 2013-07-18 MED ORDER — WHITE PETROLATUM GEL
Status: AC
  Administered 2013-07-18: 0.2
  Filled 2013-07-18: qty 5

## 2013-07-18 MED ORDER — HEPARIN SODIUM (PORCINE) 1000 UNIT/ML IJ SOLN
INTRAMUSCULAR | Status: DC | PRN
  Administered 2013-07-18: 1000 [IU]

## 2013-07-18 MED ORDER — SODIUM CHLORIDE 0.9 % IV SOLN
INTRAVENOUS | Status: DC | PRN
  Administered 2013-07-18: 07:00:00 via INTRAVENOUS

## 2013-07-18 MED ORDER — PROPOFOL 10 MG/ML IV BOLUS
INTRAVENOUS | Status: DC | PRN
  Administered 2013-07-18: 30 mg via INTRAVENOUS
  Administered 2013-07-18: 160 mg via INTRAVENOUS

## 2013-07-18 SURGICAL SUPPLY — 59 items
ARMBAND PINK RESTRICT EXTREMIT (MISCELLANEOUS) ×3 IMPLANT
BAG DECANTER FOR FLEXI CONT (MISCELLANEOUS) ×3 IMPLANT
BENZOIN TINCTURE PRP APPL 2/3 (GAUZE/BANDAGES/DRESSINGS) ×3 IMPLANT
CANISTER SUCTION 2500CC (MISCELLANEOUS) ×3 IMPLANT
CATH CANNON HEMO 15F 50CM (CATHETERS) IMPLANT
CATH CANNON HEMO 15FR 19 (HEMODIALYSIS SUPPLIES) IMPLANT
CATH CANNON HEMO 15FR 23CM (HEMODIALYSIS SUPPLIES) IMPLANT
CATH CANNON HEMO 15FR 31CM (HEMODIALYSIS SUPPLIES) IMPLANT
CATH CANNON HEMO 15FR 32CM (HEMODIALYSIS SUPPLIES) ×3 IMPLANT
CLIP LIGATING EXTRA MED SLVR (CLIP) ×3 IMPLANT
CLIP LIGATING EXTRA SM BLUE (MISCELLANEOUS) ×3 IMPLANT
CLSR STERI-STRIP ANTIMIC 1/2X4 (GAUZE/BANDAGES/DRESSINGS) ×3 IMPLANT
COVER PROBE W GEL 5X96 (DRAPES) ×3 IMPLANT
COVER SURGICAL LIGHT HANDLE (MISCELLANEOUS) ×3 IMPLANT
DECANTER SPIKE VIAL GLASS SM (MISCELLANEOUS) ×3 IMPLANT
DRAPE C-ARM 42X72 X-RAY (DRAPES) ×3 IMPLANT
DRAPE CHEST BREAST 15X10 FENES (DRAPES) ×3 IMPLANT
ELECT REM PT RETURN 9FT ADLT (ELECTROSURGICAL) ×3
ELECTRODE REM PT RTRN 9FT ADLT (ELECTROSURGICAL) ×2 IMPLANT
GAUZE SPONGE 2X2 8PLY STRL LF (GAUZE/BANDAGES/DRESSINGS) ×2 IMPLANT
GAUZE SPONGE 4X4 16PLY XRAY LF (GAUZE/BANDAGES/DRESSINGS) ×3 IMPLANT
GEL ULTRASOUND 20GR AQUASONIC (MISCELLANEOUS) IMPLANT
GLOVE BIOGEL M 7.0 STRL (GLOVE) ×3 IMPLANT
GLOVE BIOGEL PI IND STRL 6.5 (GLOVE) ×6 IMPLANT
GLOVE BIOGEL PI INDICATOR 6.5 (GLOVE) ×3
GLOVE ECLIPSE 6.5 STRL STRAW (GLOVE) ×3 IMPLANT
GLOVE SS BIOGEL STRL SZ 7.5 (GLOVE) ×2 IMPLANT
GLOVE SUPERSENSE BIOGEL SZ 7.5 (GLOVE) ×1
GLOVE SURG SS PI 7.0 STRL IVOR (GLOVE) ×3 IMPLANT
GOWN PREVENTION PLUS XXLARGE (GOWN DISPOSABLE) ×3 IMPLANT
GOWN STRL NON-REIN LRG LVL3 (GOWN DISPOSABLE) ×9 IMPLANT
KIT BASIN OR (CUSTOM PROCEDURE TRAY) ×3 IMPLANT
KIT ROOM TURNOVER OR (KITS) ×3 IMPLANT
NEEDLE 18GX1X1/2 (RX/OR ONLY) (NEEDLE) ×3 IMPLANT
NEEDLE 22X1 1/2 (OR ONLY) (NEEDLE) ×3 IMPLANT
NEEDLE HYPO 25GX1X1/2 BEV (NEEDLE) ×3 IMPLANT
NS IRRIG 1000ML POUR BTL (IV SOLUTION) ×3 IMPLANT
PACK CV ACCESS (CUSTOM PROCEDURE TRAY) ×3 IMPLANT
PACK SURGICAL SETUP 50X90 (CUSTOM PROCEDURE TRAY) ×3 IMPLANT
PAD ARMBOARD 7.5X6 YLW CONV (MISCELLANEOUS) ×6 IMPLANT
SOAP 2 % CHG 4 OZ (WOUND CARE) ×3 IMPLANT
SPONGE GAUZE 2X2 STER 10/PKG (GAUZE/BANDAGES/DRESSINGS) ×1
SPONGE GAUZE 4X4 12PLY (GAUZE/BANDAGES/DRESSINGS) ×3 IMPLANT
STRIP CLOSURE SKIN 1/2X4 (GAUZE/BANDAGES/DRESSINGS) ×3 IMPLANT
SUT ETHILON 3 0 PS 1 (SUTURE) ×3 IMPLANT
SUT PROLENE 6 0 CC (SUTURE) ×3 IMPLANT
SUT VIC AB 3-0 SH 27 (SUTURE) ×1
SUT VIC AB 3-0 SH 27X BRD (SUTURE) ×2 IMPLANT
SUT VICRYL 4-0 PS2 18IN ABS (SUTURE) ×3 IMPLANT
SYR 20CC LL (SYRINGE) ×3 IMPLANT
SYR 30ML LL (SYRINGE) IMPLANT
SYR 5ML LL (SYRINGE) ×9 IMPLANT
SYR CONTROL 10ML LL (SYRINGE) ×3 IMPLANT
SYRINGE 10CC LL (SYRINGE) ×3 IMPLANT
TAPE CLOTH SURG 4X10 WHT LF (GAUZE/BANDAGES/DRESSINGS) ×6 IMPLANT
TOWEL OR 17X24 6PK STRL BLUE (TOWEL DISPOSABLE) ×3 IMPLANT
TOWEL OR 17X26 10 PK STRL BLUE (TOWEL DISPOSABLE) ×3 IMPLANT
UNDERPAD 30X30 INCONTINENT (UNDERPADS AND DIAPERS) ×3 IMPLANT
WATER STERILE IRR 1000ML POUR (IV SOLUTION) ×3 IMPLANT

## 2013-07-18 NOTE — Procedures (Signed)
Patient was seen on dialysis and the procedure was supervised. BFR 200 Via RIJ PC BP is 130/79.  Patient appears to be tolerating treatment well

## 2013-07-18 NOTE — Anesthesia Procedure Notes (Signed)
Procedure Name: LMA Insertion Date/Time: 07/18/2013 7:33 AM Performed by: Julian Reil Pre-anesthesia Checklist: Patient identified, Emergency Drugs available, Suction available and Patient being monitored Patient Re-evaluated:Patient Re-evaluated prior to inductionOxygen Delivery Method: Circle system utilized Preoxygenation: Pre-oxygenation with 100% oxygen Intubation Type: IV induction LMA: LMA inserted LMA Size: 5.0 Tube type: Oral Number of attempts: 1 Placement Confirmation: positive ETCO2 and breath sounds checked- equal and bilateral Tube secured with: Tape Dental Injury: Teeth and Oropharynx as per pre-operative assessment

## 2013-07-18 NOTE — Preoperative (Signed)
Beta Blockers   Reason not to administer Beta Blockers:Not Applicable 

## 2013-07-18 NOTE — Progress Notes (Signed)
Patient ID: Christopher Wang, male   DOB: [birth year], 27 y.o.   MRN: [id number] S:no new complaints O:BP 115/83  Pulse 78  Temp(Src) 97.8 F (36.6 C) (Oral)  Resp 16  Ht 5\' 11"  (1.803 m)  Wt 77.5 kg (170 lb 13.7 oz)  BMI 23.84 kg/m2  SpO2 100%  Intake/Output Summary (Last 24 hours) at 07/18/13 1325 Last data filed at 07/18/13 1050  Gross per 24 hour  Intake    940 ml  Output   1050 ml  Net   -110 ml   Intake/Output: I/O last 3 completed shifts: In: 480 [P.O.:480] Out: 1000 [Urine:1000]  Intake/Output this shift:  Total I/O In: 460 [I.V.:460] Out: 50 [Blood:50] Weight change: -0.3 kg (-10.6 oz) Gen:WD WN AAM in NAD CVS:no rub Resp:cta [id number] Ext:no edema, LFA AVF +T/B   Recent Labs Lab 07/15/13 0610 07/16/13 0618 07/17/13 1240 07/18/13 0520  NA 140 142 139 139  K 4.7 3.9 4.0 3.7  CL 99 103 101 101  CO2 20 21 22 22   GLUCOSE 112* 74 100* 87  BUN 96* 96* 97* 98*  CREATININE 10.62* 11.78* 12.30* 12.16*  ALBUMIN 4.0 3.0* 3.1* 3.1*  CALCIUM 7.3* 7.1* 7.1* 7.0*  PHOS  --  8.1* 7.0* 7.4*  AST 14  --   --   --   ALT 9  --   --   --    Liver Function Tests:  Recent Labs Lab 07/15/13 0610 07/16/13 0618 07/17/13 1240 07/18/13 0520  AST 14  --   --   --   ALT 9  --   --   --   ALKPHOS 67  --   --   --   BILITOT 0.4  --   --   --   PROT 8.2  --   --   --   ALBUMIN 4.0 3.0* 3.1* 3.1*    Recent Labs Lab 07/15/13 0610 07/17/13 1240  LIPASE 168* 43   No results found for this basename: AMMONIA,  in the last 168 hours CBC:  Recent Labs Lab 07/15/13 0610 07/16/13 0618 07/17/13 1240 07/18/13 0520  WBC 10.0 5.7 5.4 5.0  NEUTROABS 9.2*  --   --   --   HGB 11.2* 8.5* 8.7* 8.4*  HCT 34.2* 26.0* 26.4* 25.5*  MCV 87.5 87.5 86.6 86.7  PLT 203 147* 144* 161   Cardiac Enzymes: No results found for this basename: CKTOTAL, CKMB, CKMBINDEX, TROPONINI,  in the last 168 hours CBG: No results found for this basename: GLUCAP,  in the last 168 hours  Iron  Studies: No results found for this basename: IRON, TIBC, TRANSFERRIN, FERRITIN,  in the last 72 hours Studies/Results: Dg Chest Port 1 View  07/18/2013   CLINICAL DATA:  Catheter placement  EXAM: PORTABLE CHEST - 1 VIEW  COMPARISON:  July 15, 2013  FINDINGS: Central catheter positioned with tips in right atrium. No pneumothorax. Lungs are clear. Heart size is upper normal with normal pulmonary vascularity. No adenopathy. There is some thoracic levoscoliosis.  IMPRESSION: Catheter tips in right atrium. No pneumothorax. No edema or consolidation.   Electronically Signed   By: Lowella Grip M.D.   On: 07/18/2013 10:29   . amLODipine  10 mg Oral Daily  . calcium acetate  1,334 mg Oral TID WC  . ciprofloxacin  500 mg Oral Q breakfast  . cloNIDine  0.2 mg Oral BID  . fentaNYL  50 mcg Intravenous Once  . heparin  5,000 Units Subcutaneous  Q8H  . metroNIDAZOLE  500 mg Oral Q8H  . sodium chloride  3 mL Intravenous Q12H    BMET    Component Value Date/Time   NA 139 07/18/2013 0520   K 3.7 07/18/2013 0520   CL 101 07/18/2013 0520   CO2 22 07/18/2013 0520   GLUCOSE 87 07/18/2013 0520   BUN 98* 07/18/2013 0520   CREATININE 12.16* 07/18/2013 0520   CALCIUM 7.0* 07/18/2013 0520   GFRNONAA 5* 07/18/2013 0520   GFRAA 6* 07/18/2013 0520   CBC    Component Value Date/Time   WBC 5.0 07/18/2013 0520   RBC 2.94* 07/18/2013 0520   RBC 3.74* 08/06/2011 1520   HGB 8.4* 07/18/2013 0520   HCT 25.5* 07/18/2013 0520   PLT 161 07/18/2013 0520   MCV 86.7 07/18/2013 0520   MCH 28.6 07/18/2013 0520   MCHC 32.9 07/18/2013 0520   RDW 12.8 07/18/2013 0520   LYMPHSABS 0.6* 07/15/2013 0610   MONOABS 0.2 07/15/2013 0610   EOSABS 0.0 07/15/2013 0610   BASOSABS 0.0 07/15/2013 0610   Assessment/Plan:  1. 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  Pt has been nonadherent with therapy and follow up and now is unfortunately at end-stage.  Will plan for HD for the next 3 days  and increase duration and BFR's.  Also will need emergency medicaid as pt is uninsured.   2. Vascular access- s/p RIJ PC and LFA AVF by Dr. Donnetta Hutching.   3. HTn- will follow with HD 4. Hyperlipidemia d/t nephrotic syndrome- per  Primary svc.  Pt has refused statins in the past. 5. SHPTH- elevated iPTH on vit D and binders 6. Anemia of chronic disease-  Will start ESA 7. Dispo- awaiting SW/CM assistance for insurance and outpt HD.  [place] A

## 2013-07-18 NOTE — Interval H&P Note (Signed)
History and Physical Interval Note:  07/18/2013 5:56 AM  Christopher Wang  has presented today for surgery, with the diagnosis of End Stage Renal Disease  The various methods of treatment have been discussed with the patient and family. After consideration of risks, benefits and other options for treatment, the patient has consented to  Procedure(s): ARTERIOVENOUS (AV) FISTULA CREATION / LEFT ARM VS INSERTION OF LEFT ARM AVGG (Left) INSERTION OF DIALYSIS CATHETER (N/A) as a surgical intervention .  The patient's history has been reviewed, patient examined, no change in status, stable for surgery.  I have reviewed the patient's chart and labs.  Questions were answered to the patient's satisfaction.     Dwyane Dupree

## 2013-07-18 NOTE — Anesthesia Preprocedure Evaluation (Signed)
Anesthesia Evaluation  Patient identified by MRN, date of birth, ID band Patient awake    Reviewed: Allergy & Precautions, H&P , NPO status , Patient's Chart, lab work & pertinent test results, reviewed documented beta blocker date and time   Airway Mallampati: I TM Distance: >3 FB     Dental  (+) Teeth Intact and Dental Advisory Given   Pulmonary          Cardiovascular hypertension, Pt. on medications     Neuro/Psych    GI/Hepatic   Endo/Other    Renal/GU ESRFRenal disease     Musculoskeletal   Abdominal   Peds  Hematology   Anesthesia Other Findings   Reproductive/Obstetrics                           Anesthesia Physical Anesthesia Plan  ASA: III  Anesthesia Plan: General   Post-op Pain Management:    Induction: Intravenous  Airway Management Planned: LMA  Additional Equipment:   Intra-op Plan:   Post-operative Plan: Extubation in OR  Informed Consent: I have reviewed the patients History and Physical, chart, labs and discussed the procedure including the risks, benefits and alternatives for the proposed anesthesia with the patient or authorized representative who has indicated his/her understanding and acceptance.   Dental advisory given  Plan Discussed with: Anesthesiologist and Surgeon  Anesthesia Plan Comments:         Anesthesia Quick Evaluation

## 2013-07-18 NOTE — Plan of Care (Signed)
Problem: Food- and Nutrition-Related Knowledge Deficit (NB-1.1) Goal: Nutrition education Formal process to instruct or train a patient/client in a skill or to impart knowledge to help patients/clients voluntarily manage or modify food choices and eating behavior to maintain or improve health. Outcome: Completed/Met Date Met:  07/18/13 Nutrition Education Note  RD consulted for Renal Education. RD discussed education with patient during HD. Pt laughed the entire time - which he stated was "normal for him to do."  Provided Choose-A-Meal Booklet to patient/family. Reviewed food groups and provided written recommended serving sizes specifically determined for patient's current nutritional status.   Explained why diet restrictions are needed and provided lists of foods to limit/avoid that are high potassium, sodium, and phosphorus. Provided specific recommendations on safer alternatives of these foods. Strongly encouraged compliance of this diet.   Discussed importance of protein intake at each meal and snack. Provided examples of how to maximize protein intake throughout the day. Discussed need for fluid restriction with dialysis, importance of minimizing weight gain between HD treatments, and renal-friendly beverage options.  Encouraged pt to discuss specific diet questions/concerns with RD at HD outpatient facility. Teach back method used.  Expect poor compliance.  Body mass index is 23.9 kg/(m^2). Pt meets criteria for normal weight based on current BMI.  Current diet order is Renal. Labs and medications reviewed. No further nutrition interventions warranted at this time. RD contact information provided. If additional nutrition issues arise, please re-consult RD.  Inda Coke MS, RD, LDN Pager: [phone] After-hours pager: [phone]

## 2013-07-18 NOTE — Transfer of Care (Signed)
Immediate Anesthesia Transfer of Care Note  Patient: Christopher Wang  Procedure(s) Performed: Procedure(s): ARTERIOVENOUS (AV) FISTULA CREATION / LEFT ARM VS INSERTION OF LEFT ARM ARTERIOVENOUS GORTEX GRAFT (Left) INSERTION OF DIALYSIS CATHETER (N/A)  Patient Location: PACU  Anesthesia Type:General  Level of Consciousness: awake, alert , oriented and patient cooperative  Airway & Oxygen Therapy: Patient Spontanous Breathing and Patient connected to nasal cannula oxygen  Post-op Assessment: Report given to PACU RN, Post -op Vital signs reviewed and stable and Patient moving all extremities  Post vital signs: Reviewed and stable  Complications: No apparent anesthesia complications

## 2013-07-18 NOTE — H&P (View-Only) (Signed)
Subjective: Interval History: none.Marland Kitchen again discussed need for short and long-term dialysis access  Objective: Vital signs in last 24 hours: Temp:  [97.6 F (36.4 C)-98.1 F (36.7 C)] 97.6 F (36.4 C) (10/19 0458) Pulse Rate:  [75-85] 75 (10/19 0458) Resp:  [16-20] 16 (10/19 0458) BP: (96-113)/(63-71) 113/71 mmHg (10/19 0458) SpO2:  [97 %-100 %] 100 % (10/19 0458) Weight:  [171 lb 8.3 oz (77.8 kg)] 171 lb 8.3 oz (77.8 kg) (10/18 2118)  Intake/Output from previous day: 10/18 0701 - 10/19 0700 In: 360 [P.O.:360] Out: 1175 [Urine:1175] Intake/Output this shift:     no change.. mull surface veins with palpable radial pulse  Lab Results:  Recent Labs  07/15/13 0610 07/16/13 0618  WBC 10.0 5.7  HGB 11.2* 8.5*  HCT 34.2* 26.0*  PLT 203 147*   BMET  Recent Labs  07/15/13 0610 07/16/13 0618  NA 140 142  K 4.7 3.9  CL 99 103  CO2 20 21  GLUCOSE 112* 74  BUN 96* 96*  CREATININE 10.62* 11.78*  CALCIUM 7.3* 7.1*    Studies/Results: Ct Abdomen Pelvis Wo Contrast  07/15/2013   CLINICAL DATA:  Abdominal pain.  EXAM: CT ABDOMEN AND PELVIS WITHOUT CONTRAST  TECHNIQUE: Multidetector CT imaging of the abdomen and pelvis was performed following the standard protocol without intravenous contrast.  COMPARISON:  None.  FINDINGS: Visualized lung bases appear normal. No gallstones are noted. The liver, spleen and pancreas appeared appear normal. However, there is noted fluid around the spleen. The appendix appears normal. There is noted significant wall and fold thickening involving proximal small bowel consistent with enteritis or other inflammation. Free fluid is also noted in the dependent portion of the pelvis. Urinary bladder appears normal. Adrenal glands appear normal. Mild bilateral renal atrophy is noted.  IMPRESSION: Mild bilateral renal atrophy. Severe wall and fold thickening of proximal small bowel is noted most consistent with inflammation or enteritis ; however infiltrative  disease such as lymphoma cannot be excluded and clinical correlation is recommend. Small amount of free fluid is noted around the spleen as well as in the dependent portion of the pelvis.   Electronically Signed   By: Sabino Dick M.D.   On: 07/15/2013 10:11   Dg Chest 2 View  07/15/2013   CLINICAL DATA:  Shortness of breath  EXAM: CHEST  2 VIEW  COMPARISON:  Prior radiograph from 02/23/2012  FINDINGS: Cardiac and mediastinal silhouettes are stable in size and contour, and remain within normal limits.  Lungs are normally inflated without airspace consolidation, pleural effusion, or pulmonary edema. There is no pneumothorax.  Osseous structures are within normal limits.  IMPRESSION: No active cardiopulmonary disease.   Electronically Signed   By: Jeannine Boga M.D.   On: 07/15/2013 06:39   Anti-infectives: Anti-infectives   Start     Dose/Rate Route Frequency Ordered Stop   07/15/13 1200  metroNIDAZOLE (FLAGYL) IVPB 500 mg     500 mg 100 mL/hr over 60 Minutes Intravenous Every 8 hours 07/15/13 1147     07/15/13 1200  ciprofloxacin (CIPRO) IVPB 400 mg     400 mg 200 mL/hr over 60 Minutes Intravenous Every 24 hours 07/15/13 1147        Assessment/Plan: s/p Procedure(s): ARTERIOVENOUS (AV) FISTULA CREATION / LEFT ARM VS INSERTION OF LEFT ARM AVGG (Left) INSERTION OF DIALYSIS CATHETER (N/A) Again discussed the need for hemodialysis access. This is for 0 7:30 in the morning. Patient states his understanding and willingness to proceed   LOS:  2 days   EARLY, TODD 07/17/2013, 8:54 AM

## 2013-07-18 NOTE — Progress Notes (Signed)
07/18/2013 4:40 PM This is a late entry.  Pt had seven beat run of vtach around [id number].  Pt asymptomatic, stable, currently sleeping.  Pt denies chest pain, SOB, or dizziness.  Pt appears to have been having several small 3-4 beat runs previously, however, this is his longest run yet.  Dr. Algis Liming notified, as well as Dr. Acie Fredrickson.  No further orders received.  Will continue to monitor. Princella Pellegrini

## 2013-07-18 NOTE — Telephone Encounter (Addendum)
Message copied by Gena Fray on Mon Jul 18, 2013  2:41 PM ------      Message from: Alfonso Patten      Created: Mon Jul 18, 2013  9:49 AM                   ----- Message -----         From: Richrd Prime, PA-C         Sent: 07/18/2013   8:30 AM           To: Alfonso Patten, RN, [facility]            6 week F/U AVF - early ------  07/18/13: spoke with patient, dpm

## 2013-07-18 NOTE — Progress Notes (Signed)
[facility] PROGRESS NOTE  LOWEL BESTON [id number] DOB: [birth year] DOA: 07/15/2013 PCP: Lucrezia Starch, MD  HPI/Brief narrative 27 y.o. 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 Had severe nephrotic syndrome secondary to same. He has been non-compliant with BP meds, refused to take statins for hyperlipidemia, failed steroids, stopped using ACTHAR gel [place] which was provided to him free by the drug company for treatment of his nephrotic syndrome, and missed Nephrology follow up appointments. He was advised of near ESRD. He presented to the [facility] ED 10/17 with complaints of nausea, vomiting, abdominal discomfort (started after eating at and headache, as well as SOB. Eval in the ED included creatinine now up to 10, lipase 168, CTA/P c/w possible small bowel enteritis. Transferred to [facility] & admitted for further evaluation. Antibiotics have been started. Nephrology consulted.  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 - Vascular surgery input regarding access noted. - S/P Right IJ and Left wrist AV fistula placement on 10/20 - For HD today, per Nephrology - Patients mother has a few questions and concerns that she wishes to address with the Nephrology team.  Abdominal pain/enteritis on CT - Patient denies sickly contacts or antibiotic use. - Improved.  - Continue empiric Cipro and Flagyl - changed to PO. - Tolerating diet - May need followup CT abdomen to ensure resolution of findings because question of lymphoma was raised on CT  Hypertension - Good inpatient control. - History of noncompliance with medications. - Continue amlodipine and clonidine. - EKG shows sinus rhythm with LVH and repolarization abnormalities, prolonged QTC. Since patient had some complaints of dyspnea on admission, will check 2-D echo.  - Continue  monitoring on telemetry.  Anemia - Likely secondary to ESRD - Transfuse if hemoglobin less than 7 g per DL.  - Not sure of his baseline hemoglobin but is probably lower than his presentation yesterday. Drop of hemoglobin from 11.2 yesterday to 8.5 today-? Dilutional. No overt bleeding. Stable - Management per nephrology: Aranesp etc..    Hyperlipidemia - Check fasting lipids.  Secondary hyperparathyroidism - Management per nephrology.  Mild Cardiomyopathy - ? Nonischemic. - LVEF 45-50% with diffuse LV hypokinesis - Probably no further w/u except continue BP control and repeat Echo in 3 months, but will get Cardiology consult to make recommendations.   DVT prophylaxis: Heparin Lines/catheters: PIV Nutrition: renal diet  Activity:  Up adlib Code Status: Full Family Communication: Discussed with mother. Disposition Plan: Home when medically stable   Consultants:  Nephrology  Vascular surgery  Procedures: 1. right IJ hemodialysis catheter with ultrasound visualization, 10/20 2. left wrist Cimino AV fistula 10/20   Antibiotics:  IV Cipro 10/17 >  IV Flagyl 10/17 >   Subjective: Denied complaints. Just returned from vascular access procedure  Objective: Filed Vitals:   07/18/13 1024 07/18/13 1030 07/18/13 1036 07/18/13 1049  BP: 120/80   115/83  Pulse:  68 76 78  Temp:  98 F (36.7 C)  97.8 F (36.6 C)  TempSrc:      Resp: 12 12 11 16   Height:      Weight:      SpO2: 100% 100% 100% 100%    Intake/Output Summary (Last 24 hours) at 07/18/13 1245 Last data filed at 07/18/13 1050  Gross per 24 hour  Intake    940 ml  Output   1050 ml  Net   -110 ml   [employer]  07/15/13 1241 07/16/13 2118 07/17/13 2026  Weight: 75.705 kg (166 lb 14.4 oz) 77.8 kg (171 lb 8.3 oz) 77.5 kg (170 lb 13.7 oz)     Exam:  General exam: Moderately built and nourished male patient lying comfortably supine in bed.  Respiratory system: Clear. No increased work of  breathing. Cardiovascular system: S1 & S2 heard, RRR. No JVD, murmurs, gallops, clicks or pedal edema. Telemetry: Sinus rhythm with deep T wave inversions Gastrointestinal system: Abdomen is nondistended, soft and nontender. Normal bowel sounds heard. Central nervous system: somnolent but easily arousable and oriented. No focal neurological deficits. Extremities: Symmetric 5 x 5 power.   Data Reviewed: Basic Metabolic Panel:  Recent Labs Lab 07/15/13 0610 07/16/13 0618 07/17/13 1240 07/18/13 0520  NA 140 142 139 139  K 4.7 3.9 4.0 3.7  CL 99 103 101 101  CO2 20 21 22 22   GLUCOSE 112* 74 100* 87  BUN 96* 96* 97* 98*  CREATININE 10.62* 11.78* 12.30* 12.16*  CALCIUM 7.3* 7.1* 7.1* 7.0*  MG  --  2.2  --   --   PHOS  --  8.1* 7.0* 7.4*   Liver Function Tests:  Recent Labs Lab 07/15/13 0610 07/16/13 0618 07/17/13 1240 07/18/13 0520  AST 14  --   --   --   ALT 9  --   --   --   ALKPHOS 67  --   --   --   BILITOT 0.4  --   --   --   PROT 8.2  --   --   --   ALBUMIN 4.0 3.0* 3.1* 3.1*    Recent Labs Lab 07/15/13 0610 07/17/13 1240  LIPASE 168* 43   No results found for this basename: AMMONIA,  in the last 168 hours CBC:  Recent Labs Lab 07/15/13 0610 07/16/13 0618 07/17/13 1240 07/18/13 0520  WBC 10.0 5.7 5.4 5.0  NEUTROABS 9.2*  --   --   --   HGB 11.2* 8.5* 8.7* 8.4*  HCT 34.2* 26.0* 26.4* 25.5*  MCV 87.5 87.5 86.6 86.7  PLT 203 147* 144* 161   Cardiac Enzymes: No results found for this basename: CKTOTAL, CKMB, CKMBINDEX, TROPONINI,  in the last 168 hours BNP (last 3 results) No results found for this basename: PROBNP,  in the last 8760 hours CBG: No results found for this basename: GLUCAP,  in the last 168 hours  Recent Results (from the past 240 hour(s))  URINE CULTURE     Status: None   Collection Time    07/15/13 10:52 AM      Result Value Range Status   Specimen Description URINE, CLEAN CATCH   Final   Special Requests NONE   Final    Culture  Setup Time     Final   Value: 07/15/2013 14:54     Performed at [place]     Final   Value: NO GROWTH     Performed at [facility]   Culture     Final   Value: NO GROWTH     Performed at [facility]   Report Status 07/16/2013 FINAL   Final      Additional labs: 1. 2 D Echo: Study Conclusions  - Left ventricle: The cavity size was normal. There was mild concentric hypertrophy. Systolic function was mildly reduced. The estimated ejection fraction was in the range of 45% to 50%. Diffuse hypokinesis. Features are consistent with a pseudonormal  left ventricular filling pattern, with concomitant abnormal relaxation and increased filling pressure (grade 2 diastolic dysfunction). - Aortic valve: Trileaflet; mildly thickened leaflets. No significant regurgitation. Mean gradient: 41mm Hg (S). - Mitral valve: Trivial regurgitation. - Left atrium: The atrium was moderately dilated. - Right atrium: The atrium was at the upper limits of normal in size. Central venous pressure: 51mm Hg (est). - Tricuspid valve: Mild regurgitation. - Pulmonary arteries: Systolic pressure could not be accurately estimated. - Pericardium, extracardiac: There was no pericardial effusion.       Studies: Dg Chest Port 1 View  07/18/2013   CLINICAL DATA:  Catheter placement  EXAM: PORTABLE CHEST - 1 VIEW  COMPARISON:  July 15, 2013  FINDINGS: Central catheter positioned with tips in right atrium. No pneumothorax. Lungs are clear. Heart size is upper normal with normal pulmonary vascularity. No adenopathy. There is some thoracic levoscoliosis.  IMPRESSION: Catheter tips in right atrium. No pneumothorax. No edema or consolidation.   Electronically Signed   By: Lowella Grip M.D.   On: 07/18/2013 10:29        Scheduled Meds: . amLODipine  10 mg Oral Daily  . calcium acetate  1,334 mg Oral TID WC  . ciprofloxacin  500 mg Oral Q breakfast  . cloNIDine  0.2  mg Oral BID  . fentaNYL  50 mcg Intravenous Once  . heparin  5,000 Units Subcutaneous Q8H  . metroNIDAZOLE  500 mg Oral Q8H  . sodium chloride  3 mL Intravenous Q12H   Continuous Infusions:    Principal Problem:   ESRD needing dialysis Active Problems:   Hyperlipidemia   Hypertension   FSGS (focal segmental glomerulosclerosis) with nephrosis   Uremia   Enteritis   Anemia in chronic kidney disease    Time spent: 38 minutes    Cathaleen Korol, MD, FACP, FHM. [facility] Pager [phone]  If 7PM-7AM, please contact night-coverage [email or web address] Password [email or web address] 07/18/2013, 12:45 PM    LOS: 3 days

## 2013-07-18 NOTE — Progress Notes (Signed)
Utilization review complete. Quincey Quesinberry RN CCM Case Mgmt  

## 2013-07-18 NOTE — Progress Notes (Signed)
07/18/2013 1:20 PM  Spoke with patient at length regarding his current health situation and the need for dialysis.  HD staff initially tried to take patient to HD around 1300, however, patient requested a few moments to gather himself and use the restroom.  While I was still in the room, patient began to ask several questions pertaining to HD treatments, their length, how outpatient dialysis worked, diet restrictions, etc.  Pt was still semi-withdrawn, but seemed more overwhelmed that anything.  He did genuinely appear to want to learn more about his condition and how to manage it.  He stated to me that he has just had so much going on since he has been in the hospital, from visitors, to medical team visits, to HD, etc.  He appeared very overwhelmed at everything that was happening to him.  I explained to him what his creatinine was, what that high of a level usually means, and how dialysis can help him.  He seemed to understand.  He had several questions pertaining to diet, and although I answered most of them, I still put in a dietician consult to come educate him on the renal diet, to which he was very receptive to.  I educated him on how long it usually takes to find an outpatient center and the steps we would need to take to obtain insurance for him, to which he verbalized understanding.  More importantly, I explained to him that although he hasn't been totally compliant in the past (for whatever reasons), it is now more important than ever to comply with treatments, do what the doctors tell him, and take the meds that are prescribed, especially since he will have insurance.  I explained the long term consequences of noncompliance, and explained that in order to lead as long of a healthy life as possible, he needed to uphold his end of the deal.  He was quiet for several moments, but it seemed as if some of this information was really starting to sink in.  Printable education on renal diet and hemodialysis  was given to patient.  He seemed receptive to information provided. Will continue to monitor patient. Princella Pellegrini

## 2013-07-18 NOTE — Op Note (Signed)
OPERATIVE REPORT  DATE OF SURGERY: 07/18/2013  PATIENT: Christopher Wang, 27 y.o. male MRN: [id number]  DOB: 1985/11/05  PRE-OPERATIVE DIAGNOSIS: End-stage renal disease  POST-OPERATIVE DIAGNOSIS:  Same  PROCEDURE: #1 right IJ hemodialysis catheter with ultrasound visualization, #2 left wrist Cimino AV fistula  SURGEON:  Curt Jews, M.D.  PHYSICIAN ASSISTANT: Roczniak  ANESTHESIA:  Gen.  EBL: Minimal ml  Total I/O In: 460 [I.V.:460] Out: 50 [Blood:50]  BLOOD ADMINISTERED: None  DRAINS: None  SPECIMEN: None  COUNTS CORRECT:  YES  PLAN OF CARE: PACU with chest x-ray pending   PATIENT DISPOSITION:  PACU - hemodynamically stable  PROCEDURE DETAILS: The patient was taken to the operating room placed supine position where the area of the left arm was prepped and draped in the sterile fashion. Ultrasound visualization revealed good caliber cephalic vein at the wrist extending all the way to the antecubital space. Incision was made between the level of the radial artery and the cephalic vein at the wrist. The vein was of good caliber and was ligated distally and was divided. Curvature branches were ligated with 30 and 4-0 silk ties and divided. The vein was brought into approximation of the radial artery. The radial artery was exposed through the same incision. The artery was occluded proximal and distally was opened with an 11 blade and extended longitudinally with Potts scissors. The vein was spatulated and sewn end-to-side to the artery with a running 6-0 Prolene suture. Clamps removed and good flow was noted.  Next the right and left neck were prepped and draped in sterile fashion. Ultrasound visualization revealed widely patent jugular vein. Using an 18-gauge needle and the Seldinger technique a guidewire specimen of level the right atrium this was confirmed with fluoroscopy. A 27 cm hemodialysis catheter was passed through the peel-away sheath which was passed over the guidewire.  The tips were positioned at the level of the distal right atrium. Catheter was brought through subcutaneous tunnel through a separate stab incision. Both lumens flushed and aspirated easily reluctant without unit per cc heparin. Catheter secured to the skin 3-0 nylon stitch and exercise closed with a 4 septic Vicryl stitch. His was taken to the recovery in stable condition with chest x-ray pending   Curt Jews, M.D. 07/18/2013 11:39 AM

## 2013-07-18 NOTE — Anesthesia Postprocedure Evaluation (Signed)
Anesthesia Post Note  Patient: Christopher Wang  Procedure(s) Performed: Procedure(s) (LRB): ARTERIOVENOUS (AV) FISTULA CREATION  (Left) INSERTION OF DIALYSIS CATHETER (Right)  Anesthesia type: general  Patient location: PACU  Post pain: Pain level controlled  Post assessment: Patient's Cardiovascular Status Stable  Last Vitals:  Filed Vitals:   07/18/13 1049  BP: 115/83  Pulse: 78  Temp: 36.6 C  Resp: 16    Post vital signs: Reviewed and stable  Level of consciousness: sedated  Complications: No apparent anesthesia complications

## 2013-07-18 NOTE — Consult Note (Signed)
CONSULT NOTE  Date: 07/18/2013               Patient Name:  Christopher Wang MRN: [id number]  DOB: 02-23-1986 Age / Sex: 27 y.o., male        PCP: DUNHAM,CYNTHIA B Primary Cardiologist: New to [facility]            Referring Physician: Vernell Leep, MD              Reason for Consult: CHF in the setting of ESRD           History of Present Illness: Patient is a 27 y.o. male with a PMHx of ESRD, HTN, hyperlipidemia, who was admitted to [facility] on 07/15/2013 for evaluation of nausea, vomiting, abdominal pain.   Echo was found to have EF of 45-50% and we were consulted.   The patient has had high blood pressure for the past several years. He has not been taking any of his medications. He eats a fairly high salt diet.  The patient has been noncompliant with meds and has not reached ESRD and will need dialysis.     He has shortness of breath any sort of exertion. He works at [employer]. He does not get any regular aerobic exercise. Has occasional episodes of chest tightness this sounds more like volume overload/congestive heart failure.  Medications: Outpatient medications: Prescriptions prior to admission  Medication Sig Dispense Refill  . amLODipine (NORVASC) 10 MG tablet Take 10 mg by mouth daily.        Current medications: Current Facility-Administered Medications  Medication Dose Route Frequency Provider Last Rate Last Dose  . acetaminophen (TYLENOL) tablet 650 mg  650 mg Oral Q6H PRN Ripudeep Krystal Eaton, MD       Or  . acetaminophen (TYLENOL) suppository 650 mg  650 mg Rectal Q6H PRN Ripudeep K Rai, MD      . amLODipine (NORVASC) tablet 10 mg  10 mg Oral Daily Ripudeep Krystal Eaton, MD   10 mg at 07/17/13 0946  . calcium acetate (PHOSLO) capsule 1,334 mg  1,334 mg Oral TID WC Lucrezia Starch, MD   1,334 mg at 07/17/13 1659  . ciprofloxacin (CIPRO) tablet 500 mg  500 mg Oral Q breakfast Modena Jansky, MD      . cloNIDine (CATAPRES) tablet 0.2 mg  0.2 mg Oral BID Lucrezia Starch, MD   0.2 mg at 07/17/13 2305  . darbepoetin (ARANESP) injection 60 mcg  60 mcg Intravenous Q Mon-HD Donetta Potts, MD      . fentaNYL (SUBLIMAZE) injection 50 mcg  50 mcg Intravenous Once Orlie Dakin, MD      . heparin injection 5,000 Units  5,000 Units Subcutaneous Q8H Ripudeep Krystal Eaton, MD   5,000 Units at 07/17/13 2311  . HYDROcodone-acetaminophen (NORCO/VICODIN) 5-325 MG per tablet 1 tablet  1 tablet Oral Q4H PRN Ripudeep Krystal Eaton, MD   1 tablet at 07/18/13 1118  . HYDROmorphone (DILAUDID) injection 1 mg  1 mg Intravenous Q4H PRN Ripudeep K Rai, MD      . metroNIDAZOLE (FLAGYL) tablet 500 mg  500 mg Oral Q8H Modena Jansky, MD   500 mg at 07/17/13 2311  . ondansetron (ZOFRAN) tablet 4 mg  4 mg Oral Q6H PRN Ripudeep K Rai, MD       Or  . ondansetron (ZOFRAN) injection 4 mg  4 mg Intravenous Q6H PRN Ripudeep Krystal Eaton, MD   4 mg at  07/15/13 1401  . sodium chloride 0.9 % injection 3 mL  3 mL Intravenous Q12H Ripudeep K Rai, MD   3 mL at 07/17/13 2321     No Known Allergies   Past Medical History  Diagnosis Date  . Anemia   . Nephrotic syndrome   . Hypertension 08/09/2011    Past Surgical History  Procedure Laterality Date  . Renal biopsy      Family History  Problem Relation Age of Onset  . Hypertension Mother     Social History:  reports that he has been passively smoking Cigars.  He has never used smokeless tobacco. He reports that he drinks about 0.6 ounces of alcohol per week. He reports that he does not use illicit drugs.   Review of Systems: Constitutional:  denies fever, chills, diaphoresis, appetite change and fatigue.  HEENT: denies photophobia, eye pain, redness, hearing loss, ear pain, congestion, sore throat, rhinorrhea, sneezing, neck pain, neck stiffness and tinnitus.  Respiratory: admits to SOB, DOE,   chest tightness,   Cardiovascular: denies chest pain, palpitations and leg swelling.  Gastrointestinal: admits to nausea, vomiting, abdominal pain,      Genitourinary: denies dysuria, urgency, frequency, hematuria, flank pain and difficulty urinating.  Musculoskeletal: denies  myalgias, back pain, joint swelling, arthralgias and gait problem.   Skin: denies pallor, rash and wound.  Neurological: denies dizziness, seizures, syncope, weakness, light-headedness, numbness and headaches.   Hematological: denies adenopathy, easy bruising, personal or family bleeding history.  Psychiatric/ Behavioral: denies suicidal ideation, mood changes, confusion, nervousness, sleep disturbance and agitation.    Physical Exam: BP 112/71  Pulse 79  Temp(Src) 97.9 F (36.6 C) (Oral)  Resp 18  Ht 5\' 11"  (1.803 m)  Wt 170 lb 13.7 oz (77.5 kg)  BMI 23.84 kg/m2  SpO2 100%  General: Vital signs reviewed and noted. Well-developed, well-nourished, in no acute distress; alert, appropriate and cooperative throughout examination.  Head: Normocephalic, atraumatic, sclera anicteric, mucus membranes are moist  Neck: Supple. Negative for carotid bruits. JVD not elevated.  Lungs:  Clear bilaterally to auscultation without wheezes, rales, or rhonchi. Breathing is unlabored.  Heart: RR with S1 S2. No murmurs, rubs, or gallops .  Abdomen:  Soft, non-tender, non-distended with normoactive bowel sounds. No hepatomegaly. No rebound/guarding. No obvious abdominal masses  MSK: Strength and the appear normal for age.  Extremities: No clubbing or cyanosis. No edema.  Distal pedal pulses are 2+ and equal bilaterally.  Neurologic: Alert and oriented X 3. Moves all extremities spontaneously.  Psych: Responds to questions appropriately with a normal affect.    Lab results: Basic Metabolic Panel:  Recent Labs Lab 07/16/13 0618 07/17/13 1240 07/18/13 0520  NA 142 139 139  K 3.9 4.0 3.7  CL 103 101 101  CO2 21 22 22   GLUCOSE 74 100* 87  BUN 96* 97* 98*  CREATININE 11.78* 12.30* 12.16*  CALCIUM 7.1* 7.1* 7.0*  MG 2.2  --   --   PHOS 8.1* 7.0* 7.4*    Liver Function  Tests:  Recent Labs Lab 07/15/13 0610 07/16/13 0618 07/17/13 1240 07/18/13 0520  AST 14  --   --   --   ALT 9  --   --   --   ALKPHOS 67  --   --   --   BILITOT 0.4  --   --   --   PROT 8.2  --   --   --   ALBUMIN 4.0 3.0* 3.1* 3.1*  Recent Labs Lab 07/15/13 0610 07/17/13 1240  LIPASE 168* 43   No results found for this basename: AMMONIA,  in the last 168 hours  CBC:  Recent Labs Lab 07/15/13 0610 07/16/13 0618 07/17/13 1240 07/18/13 0520  WBC 10.0 5.7 5.4 5.0  NEUTROABS 9.2*  --   --   --   HGB 11.2* 8.5* 8.7* 8.4*  HCT 34.2* 26.0* 26.4* 25.5*  MCV 87.5 87.5 86.6 86.7  PLT 203 147* 144* 161    Cardiac Enzymes: No results found for this basename: CKTOTAL, CKMB, CKMBINDEX, TROPONINI,  in the last 168 hours  BNP: No components found with this basename: POCBNP,   CBG: No results found for this basename: GLUCAP,  in the last 168 hours  Coagulation Studies: No results found for this basename: LABPROT, INR,  in the last 72 hours   Other results:  EKG :  NSR, LVH    Imaging: Dg Chest Port 1 View  07/18/2013   CLINICAL DATA:  Catheter placement  EXAM: PORTABLE CHEST - 1 VIEW  COMPARISON:  July 15, 2013  FINDINGS: Central catheter positioned with tips in right atrium. No pneumothorax. Lungs are clear. Heart size is upper normal with normal pulmonary vascularity. No adenopathy. There is some thoracic levoscoliosis.  IMPRESSION: Catheter tips in right atrium. No pneumothorax. No edema or consolidation.   Electronically Signed   By: Lowella Grip M.D.   On: 07/18/2013 10:29      Last Echo  Left ventricle: The cavity size was normal. There was mild concentric hypertrophy. Systolic function was mildly reduced. The estimated ejection fraction was in the range of 45% to 50%. Diffuse hypokinesis. Features are consistent with a pseudonormal left ventricular filling pattern, with concomitant abnormal relaxation and increased filling pressure (grade 2  diastolic dysfunction). - Aortic valve: Trileaflet; mildly thickened leaflets. No significant regurgitation. Mean gradient: 5mm Hg (S). - Mitral valve: Trivial regurgitation. - Left atrium: The atrium was moderately dilated. - Right atrium: The atrium was at the upper limits of normal in size. Central venous pressure: 43mm Hg (est). - Tricuspid valve: Mild regurgitation. - Pulmonary arteries: Systolic pressure could not be accurately estimated. - Pericardium, extracardiac: There was no pericardial effusion.    Assessment & Plan:  1. mild chronic systolic congestive heart failure: Jajaun has mild systolic congestive heart failure primarily because of his end-stage renal disease and subsequent volume overload. He has long-standing hypertension but has not taken his medications. His left ventricular systolic function is only mildly depressed. I suspect that this will improve after he starts dialysis later today.  I've explained the importance of taking his medications.   We discussed the importance of sticking to a good diet and to try to avoid any extra salt. In addition, dialysis will take care of his volume overload.  At this time I do not have any additional recommendations. We can see him in the office if needed for further titration of his blood pressure medications.   Will sign off.  Call for questions.     Thayer Headings, Brooke Bonito., MD, [facility] 07/18/2013, 1:52 PM

## 2013-07-19 ENCOUNTER — Encounter (HOSPITAL_COMMUNITY): Payer: Self-pay | Admitting: Vascular Surgery

## 2013-07-19 LAB — RENAL FUNCTION PANEL
Albumin: 2.8 g/dL — ABNORMAL LOW (ref 3.5–5.2)
BUN: 68 mg/dL — ABNORMAL HIGH (ref 6–23)
CO2: 24 mEq/L (ref 19–32)
Calcium: 6.9 mg/dL — ABNORMAL LOW (ref 8.4–10.5)
Chloride: 105 mEq/L (ref 96–112)
Creatinine, Ser: 9.68 mg/dL — ABNORMAL HIGH (ref 0.50–1.35)
GFR calc Af Amer: 8 mL/min — ABNORMAL LOW (ref >90)
GFR calc non Af Amer: 7 mL/min — ABNORMAL LOW (ref >90)
Glucose, Bld: 94 mg/dL (ref 70–99)
Phosphorus: 6 mg/dL — ABNORMAL HIGH (ref 2.3–4.6)
Potassium: 4.2 mEq/L (ref 3.5–5.1)
Sodium: 142 mEq/L (ref 135–145)

## 2013-07-19 LAB — HEPATITIS B CORE ANTIBODY, TOTAL: Hep B Core Total Ab: NONREACTIVE

## 2013-07-19 LAB — HEPATITIS B SURFACE ANTIBODY,QUALITATIVE: Hep B S Ab: NEGATIVE

## 2013-07-19 LAB — HEPATITIS B SURFACE ANTIGEN: Hepatitis B Surface Ag: NEGATIVE

## 2013-07-19 MED ORDER — CARVEDILOL 3.125 MG PO TABS
3.1250 mg | ORAL_TABLET | Freq: Two times a day (BID) | ORAL | Status: DC
  Administered 2013-07-19 – 2013-07-22 (×5): 3.125 mg via ORAL
  Filled 2013-07-19 (×8): qty 1

## 2013-07-19 MED ORDER — HYDROMORPHONE HCL PF 1 MG/ML IJ SOLN
INTRAMUSCULAR | Status: AC
  Administered 2013-07-19: 1 mg via INTRAVENOUS
  Filled 2013-07-19: qty 1

## 2013-07-19 NOTE — Progress Notes (Signed)
Telemetry notified RN that patient had 7 beat run of vtach.  Patient asymptomatic.  MD notified

## 2013-07-19 NOTE — Progress Notes (Addendum)
[facility]  Subjective  - Left arm was hurting until I took pain medication.  He inquired about his heart ans states he has been having "chest pain"   Objective 120/83 69 97.9 F (36.6 C) (Oral) 16 100%  Intake/Output Summary (Last 24 hours) at 07/19/13 0743 Last data filed at 07/19/13 0459  Gross per 24 hour  Intake   1060 ml  Output   2105 ml  Net  -1045 ml    Heart RRR, PVC's Left fore arm palpable thrill, incision lean and dry Diatek used for dialysis yesterday and worked fine  Assessment/Planning: Procedure(s): ARTERIOVENOUS (AV) FISTULA CREATION  INSERTION OF DIALYSIS CATHETER  1 Day Post-OpSurgeon(s): Rosetta Posner, MD CHF  Echo was found to have EF of 45-50% Consult was performed per Dr. Wonda Cheng Nahser CKD HTN F/U with Dr. Donnetta Hutching in 4 weeks   Theda Sers [place] 07/19/2013 7:43 AM --  Laboratory Lab Results:  Recent Labs  07/17/13 1240 07/18/13 0520  WBC 5.4 5.0  HGB 8.7* 8.4*  HCT 26.4* 25.5*  PLT 144* 161   BMET  Recent Labs  07/18/13 0520 07/19/13 0428  NA 139 142  K 3.7 4.2  CL 101 105  CO2 22 24  GLUCOSE 87 94  BUN 98* 68*  CREATININE 12.16* 9.68*  CALCIUM 7.0* 6.9*    COAG Lab Results  Component Value Date   INR 0.88 08/08/2011   No results found for this basename: PTT       I have examined the patient, reviewed and agree with above. Excellent thrill in left wrist Cimino fistula. Had adequate dialysis with catheter yesterday. Will not follow actively. Will followup in the office in one month.  Detroit Frieden, MD 07/19/2013 7:56 AM

## 2013-07-19 NOTE — Progress Notes (Signed)
Patient ID: Christopher Wang, male   DOB: January 01, 1986, 27 y.o.   MRN: [id number] S: no c/o.  S/p HD #2.   O:BP 140/95  Pulse 81  Temp(Src) 98.1 F (36.7 C) (Oral)  Resp 17  Ht 5\' 11"  (1.803 m)  Wt 76.3 kg (168 lb 3.4 oz)  BMI 23.47 kg/m2  SpO2 100%  Intake/Output Summary (Last 24 hours) at 07/19/13 1114 Last data filed at 07/19/13 1105  Gross per 24 hour  Intake    480 ml  Output   3347 ml  Net  -2867 ml   Intake/Output: I/O last 3 completed shifts: In: 1060 [P.O.:600; I.V.:460] Out: [id number] [Urine:800; [id number]; Blood:50]  Intake/Output this shift:  Total I/O In: -  Out: 1292 [Urine:300; Other:992] Weight change: 0.2 kg (7.1 oz) GEN NAD CV RRR LUNG CTAB, nl wob ABD: s/nt SKIN R IJ CVC tunneled intact EXT: no LEE b/l NEURO: nonfocal   Recent Labs Lab 07/15/13 0610 07/16/13 0618 07/17/13 1240 07/18/13 0520 07/19/13 0428  NA 140 142 139 139 142  K 4.7 3.9 4.0 3.7 4.2  CL 99 103 101 101 105  CO2 20 21 22 22 24   GLUCOSE 112* 74 100* 87 94  BUN 96* 96* 97* 98* 68*  CREATININE 10.62* 11.78* 12.30* 12.16* 9.68*  ALBUMIN 4.0 3.0* 3.1* 3.1* 2.8*  CALCIUM 7.3* 7.1* 7.1* 7.0* 6.9*  PHOS  --  8.1* 7.0* 7.4* 6.0*  AST 14  --   --   --   --   ALT 9  --   --   --   --    Liver Function Tests:  Recent Labs Lab 07/15/13 0610  07/17/13 1240 07/18/13 0520 07/19/13 0428  AST 14  --   --   --   --   ALT 9  --   --   --   --   ALKPHOS 67  --   --   --   --   BILITOT 0.4  --   --   --   --   PROT 8.2  --   --   --   --   ALBUMIN 4.0  < > 3.1* 3.1* 2.8*  < > = values in this interval not displayed.  Recent Labs Lab 07/15/13 0610 07/17/13 1240  LIPASE 168* 43    CBC:  Recent Labs Lab 07/15/13 0610 07/16/13 0618 07/17/13 1240 07/18/13 0520  WBC 10.0 5.7 5.4 5.0  NEUTROABS 9.2*  --   --   --   HGB 11.2* 8.5* 8.7* 8.4*  HCT 34.2* 26.0* 26.4* 25.5*  MCV 87.5 87.5 86.6 86.7  PLT 203 147* 144* 161   Iron Studies:   Recent Labs  07/18/13 1430  IRON 108   TIBC 177*  FERRITIN 273   Studies/Results: Dg Chest Port 1 View  07/18/2013   CLINICAL DATA:  Catheter placement  EXAM: PORTABLE CHEST - 1 VIEW  COMPARISON:  July 15, 2013  FINDINGS: Central catheter positioned with tips in right atrium. No pneumothorax. Lungs are clear. Heart size is upper normal with normal pulmonary vascularity. No adenopathy. There is some thoracic levoscoliosis.  IMPRESSION: Catheter tips in right atrium. No pneumothorax. No edema or consolidation.   Electronically Signed   By: Lowella Grip M.D.   On: 07/18/2013 10:29   Dg Fluoro Guide Cv Line-no Report  07/18/2013   CLINICAL DATA: Diatek catheter   FLOURO GUIDE CV LINE  Fluoroscopy was utilized by the requesting physician.  No  radiographic  interpretation.    Marland Kitchen amLODipine  10 mg Oral Daily  . calcium acetate  1,334 mg Oral TID WC  . ciprofloxacin  500 mg Oral Q breakfast  . cloNIDine  0.2 mg Oral BID  . darbepoetin (ARANESP) injection - DIALYSIS  60 mcg Intravenous Q Mon-HD  . fentaNYL  50 mcg Intravenous Once  . heparin  5,000 Units Subcutaneous Q8H  . metroNIDAZOLE  500 mg Oral Q8H  . sodium chloride  3 mL Intravenous Q12H    BMET    Component Value Date/Time   NA 142 07/19/2013 0428   K 4.2 07/19/2013 0428   CL 105 07/19/2013 0428   CO2 24 07/19/2013 0428   GLUCOSE 94 07/19/2013 0428   BUN 68* 07/19/2013 0428   CREATININE 9.68* 07/19/2013 0428   CALCIUM 6.9* 07/19/2013 0428   GFRNONAA 7* 07/19/2013 0428   GFRAA 8* 07/19/2013 0428   CBC    Component Value Date/Time   WBC 5.0 07/18/2013 0520   RBC 2.94* 07/18/2013 0520   RBC 3.74* 08/06/2011 1520   HGB 8.4* 07/18/2013 0520   HCT 25.5* 07/18/2013 0520   PLT 161 07/18/2013 0520   MCV 86.7 07/18/2013 0520   MCH 28.6 07/18/2013 0520   MCHC 32.9 07/18/2013 0520   RDW 12.8 07/18/2013 0520   LYMPHSABS 0.6* 07/15/2013 0610   MONOABS 0.2 07/15/2013 0610   EOSABS 0.0 07/15/2013 0610   BASOSABS 0.0 07/15/2013 0610    Assessment/Plan:  1. 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  Pt has been nonadherent with therapy and follow up and now is unfortunately at end-stage. Pt s/p 2/3 consecutive HD treatments with increased duration and BFR's.  Also will need emergency medicaid as pt is uninsured.   2. Vascular access- s/p RIJ PC and LFA AVF by Dr. Donnetta Hutching.   3. HTn- will follow with HD; stable currently 4. Hyperlipidemia d/t nephrotic syndrome- per  Primary svc.  Pt has refused statins in the past. 5. SHPTH- Phos coming down with binders.  (CaAcetate) 6. Anemia of chronic disease-  Will start ESA (aranesp 60 10/20; Fe acceptable for now) 7. Dispo- awaiting SW/CM assistance for insurance and outpt HD.  Pearson Grippe, B

## 2013-07-19 NOTE — Progress Notes (Addendum)
[facility] PROGRESS NOTE  Christopher Wang [id number] DOB: 26-Jan-1986 DOA: 07/15/2013 PCP: Lucrezia Starch, MD  HPI/Brief narrative 27 y.o. 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 Had severe nephrotic syndrome secondary to same. He has been non-compliant with BP meds, refused to take statins for hyperlipidemia, failed steroids, stopped using ACTHAR gel [place] which was provided to him free by the drug company for treatment of his nephrotic syndrome, and missed Nephrology follow up appointments. He was advised of near ESRD. He presented to the [facility] ED 10/17 with complaints of nausea, vomiting, abdominal discomfort (started after eating at and headache, as well as SOB. Eval in the ED included creatinine now up to 10, lipase 168, CTA/P c/w possible small bowel enteritis. Transferred to [facility] & admitted for further evaluation. Antibiotics have been started. Nephrology consulted.  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 - Vascular surgery signed off after creating access for HD. - S/P Right IJ and Left wrist AV fistula placement on 10/20 - On day 2/3 HD today - Mx per Nephrology  Abdominal pain/enteritis on CT - Patient denies sickly contacts or antibiotic use. - Improved.  - Continue empiric Cipro and Flagyl - changed to PO- consider 7-10 days of total Abx. - Will need followup CT abdomen to ensure resolution of findings because question of lymphoma was raised on CT  Hypertension - Mildly uncontrolled.  - History of noncompliance with medications. - Continue amlodipine. Change Clonidine to Carvedilol which would also address PVC's/? NSVT - EKG shows sinus rhythm with LVH and repolarization abnormalities, prolonged QTC.  - Continue monitoring on telemetry.  Anemia - Likely secondary to ESRD - Transfuse if hemoglobin less than 7 g per  DL.  - Stable - Management per nephrology: Aranesp etc..    Hyperlipidemia - Consider statins.  Secondary hyperparathyroidism - Management per nephrology.  Mild Cardiomyopathy/Mild AoC Systolic CHF on admission - ? Nonischemic. - LVEF 45-50% with diffuse LV hypokinesis - Probably no further w/u except continue BP control and repeat Echo in 3 months, but will get Cardiology consult to make recommendations. - Cardiology input appreciated- no further WU- continue HD, BP control, diet etc and follow up repeat Echo  - Concern of NSVT on Tele on 10/20. Reviewed tele: seem more like short run of PVC's rather than NSVT. - Continue Telemetry.   DVT prophylaxis: Heparin Lines/catheters: PIV Nutrition: renal diet  Activity:  Up adlib Code Status: Full Family Communication: Discussed with mother 07/19/13. Disposition Plan: Home when medically stable, pending Nephrology clearance and OP HD arrangement. Requested CM to assist with OP PCP.   Consultants:  Nephrology  Vascular surgery  Procedures: 1. right IJ hemodialysis catheter with ultrasound visualization, 10/20 2. left wrist Cimino AV fistula 10/20   Antibiotics:  Cipro 10/17 >  Flagyl 10/17 >   Subjective: Denied complaints. Seen at HD this morning.  Objective: Filed Vitals:   07/19/13 1044 07/19/13 1105 07/19/13 1131 07/19/13 1300  BP: 154/109 140/95 147/88 145/88  Pulse: 80 81 78 88  Temp:  98.1 F (36.7 C)  98.3 F (36.8 C)  TempSrc:  Oral    Resp: 14 17 18 18   Height:      Weight:  74.8 kg (164 lb 14.5 oz)    SpO2:  100% 100% 100%    Intake/Output Summary (Last 24 hours) at 07/19/13 1323 Last data filed at 07/19/13 1301  Gross per 24 hour  Intake  360 ml  Output   3347 ml  Net  -2987 ml   Filed Weights   07/18/13 2034 07/19/13 0749 07/19/13 1105  Weight: 76.201 kg (167 lb 15.9 oz) 76.3 kg (168 lb 3.4 oz) 74.8 kg (164 lb 14.5 oz)     Exam:  General exam: Moderately built and nourished male  patient lying, comfortable.  Respiratory system: Clear. No increased work of breathing. Cardiovascular system: S1 & S2 heard, RRR. No JVD, murmurs, gallops, clicks or pedal edema. Telemetry: Sinus rhythm. Short run of PVC's on 10/20 Gastrointestinal system: Abdomen is nondistended, soft and nontender. Normal bowel sounds heard. Central nervous system: somnolent but easily arousable and oriented. No focal neurological deficits. Extremities: Symmetric 5 x 5 power.   Data Reviewed: Basic Metabolic Panel:  Recent Labs Lab 07/15/13 0610 07/16/13 0618 07/17/13 1240 07/18/13 0520 07/19/13 0428  NA 140 142 139 139 142  K 4.7 3.9 4.0 3.7 4.2  CL 99 103 101 101 105  CO2 20 21 22 22 24   GLUCOSE 112* 74 100* 87 94  BUN 96* 96* 97* 98* 68*  CREATININE 10.62* 11.78* 12.30* 12.16* 9.68*  CALCIUM 7.3* 7.1* 7.1* 7.0* 6.9*  MG  --  2.2  --   --   --   PHOS  --  8.1* 7.0* 7.4* 6.0*   Liver Function Tests:  Recent Labs Lab 07/15/13 0610 07/16/13 0618 07/17/13 1240 07/18/13 0520 07/19/13 0428  AST 14  --   --   --   --   ALT 9  --   --   --   --   ALKPHOS 67  --   --   --   --   BILITOT 0.4  --   --   --   --   PROT 8.2  --   --   --   --   ALBUMIN 4.0 3.0* 3.1* 3.1* 2.8*    Recent Labs Lab 07/15/13 0610 07/17/13 1240  LIPASE 168* 43   No results found for this basename: AMMONIA,  in the last 168 hours CBC:  Recent Labs Lab 07/15/13 0610 07/16/13 0618 07/17/13 1240 07/18/13 0520  WBC 10.0 5.7 5.4 5.0  NEUTROABS 9.2*  --   --   --   HGB 11.2* 8.5* 8.7* 8.4*  HCT 34.2* 26.0* 26.4* 25.5*  MCV 87.5 87.5 86.6 86.7  PLT 203 147* 144* 161   Cardiac Enzymes: No results found for this basename: CKTOTAL, CKMB, CKMBINDEX, TROPONINI,  in the last 168 hours BNP (last 3 results) No results found for this basename: PROBNP,  in the last 8760 hours CBG: No results found for this basename: GLUCAP,  in the last 168 hours  Recent Results (from the past 240 hour(s))  URINE CULTURE      Status: None   Collection Time    07/15/13 10:52 AM      Result Value Range Status   Specimen Description URINE, CLEAN CATCH   Final   Special Requests NONE   Final   Culture  Setup Time     Final   Value: 07/15/2013 14:54     Performed at [place]     Final   Value: NO GROWTH     Performed at [facility]   Culture     Final   Value: NO GROWTH     Performed at [facility]   Report Status 07/16/2013 FINAL   Final  Additional labs: 1. 2 D Echo: Study Conclusions  - Left ventricle: The cavity size was normal. There was mild concentric hypertrophy. Systolic function was mildly reduced. The estimated ejection fraction was in the range of 45% to 50%. Diffuse hypokinesis. Features are consistent with a pseudonormal left ventricular filling pattern, with concomitant abnormal relaxation and increased filling pressure (grade 2 diastolic dysfunction). - Aortic valve: Trileaflet; mildly thickened leaflets. No significant regurgitation. Mean gradient: 8mm Hg (S). - Mitral valve: Trivial regurgitation. - Left atrium: The atrium was moderately dilated. - Right atrium: The atrium was at the upper limits of normal in size. Central venous pressure: 15mm Hg (est). - Tricuspid valve: Mild regurgitation. - Pulmonary arteries: Systolic pressure could not be accurately estimated. - Pericardium, extracardiac: There was no pericardial effusion. 2. Fasting lipids: Cholesterol 191, triglycerides 178, HDL 50, LDL 105 and VLDL 36 3. Iron Panel: Iron-108, TIBC 177, saturation 61, ferritin 273 4. Hepatitis B surface antigen negative, Hep B S Ab: Negative, Hep B Core Ab: Negative      Studies: Dg Chest Port 1 View  07/18/2013   CLINICAL DATA:  Catheter placement  EXAM: PORTABLE CHEST - 1 VIEW  COMPARISON:  July 15, 2013  FINDINGS: Central catheter positioned with tips in right atrium. No pneumothorax. Lungs are clear. Heart size is upper normal with  normal pulmonary vascularity. No adenopathy. There is some thoracic levoscoliosis.  IMPRESSION: Catheter tips in right atrium. No pneumothorax. No edema or consolidation.   Electronically Signed   By: Lowella Grip M.D.   On: 07/18/2013 10:29   Dg Fluoro Guide Cv Line-no Report  07/18/2013   CLINICAL DATA: Diatek catheter   FLOURO GUIDE CV LINE  Fluoroscopy was utilized by the requesting physician.  No radiographic  interpretation.         Scheduled Meds: . amLODipine  10 mg Oral Daily  . calcium acetate  1,334 mg Oral TID WC  . ciprofloxacin  500 mg Oral Q breakfast  . cloNIDine  0.2 mg Oral BID  . darbepoetin (ARANESP) injection - DIALYSIS  60 mcg Intravenous Q Mon-HD  . fentaNYL  50 mcg Intravenous Once  . heparin  5,000 Units Subcutaneous Q8H  . metroNIDAZOLE  500 mg Oral Q8H  . sodium chloride  3 mL Intravenous Q12H   Continuous Infusions:    Principal Problem:   ESRD needing dialysis Active Problems:   Hyperlipidemia   Hypertension   FSGS (focal segmental glomerulosclerosis) with nephrosis   Uremia   Enteritis   Anemia in chronic kidney disease    Time spent: 92 minutes    Perl Folmar, MD, FACP, FHM. [facility] Pager [phone]  If 7PM-7AM, please contact night-coverage [email or web address] Password [email or web address] 07/19/2013, 1:23 PM    LOS: 4 days

## 2013-07-19 NOTE — Progress Notes (Signed)
Central telemetry notified that patient had a 4 beat run of vtach. Patient asymptomatic. MD notified.

## 2013-07-19 NOTE — Progress Notes (Signed)
07/19/2013 10:51 AM Hemodialysis Outpatient Note: I have initiated the process for placement at the [facility]. I have notified the financial counselor Caryl Pina to investigate whether this patient has insurance. An application for medicaid will be taken tomorrow,Wednesday at 9 AM. I will update when I have confirmation regarding his placement.Thank you. Gordy Savers

## 2013-07-20 DIAGNOSIS — E785 Hyperlipidemia, unspecified: Secondary | ICD-10-CM

## 2013-07-20 MED ORDER — DOCUSATE SODIUM 100 MG PO CAPS
100.0000 mg | ORAL_CAPSULE | Freq: Two times a day (BID) | ORAL | Status: DC | PRN
  Administered 2013-07-20: 100 mg via ORAL
  Filled 2013-07-20: qty 1

## 2013-07-20 NOTE — Progress Notes (Signed)
[facility]                                                                                Patient Demographics  Christopher Wang, is a 27 y.o. male, DOB - Aug 01, 1986, [id number]  Admit date - 07/15/2013   Admitting Physician Ripudeep Krystal Eaton, MD  Outpatient Primary MD for the patient is Lucrezia Starch, MD  LOS - 5   Chief Complaint  Patient presents with  . 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  - Vascular surgery signed off after creating access for HD.  - S/P Right IJ and Left wrist AV fistula placement on 10/20   Abdominal pain/enteritis on CT  - Patient denies sickly contacts or antibiotic use.  - Improved.  - Continue empiric Cipro and Flagyl - changed to PO- consider 7-10 days of total Abx.  - Will need followup CT abdomen to ensure resolution of findings because question of lymphoma was raised on CT   Hypertension  - Mildly uncontrolled.  - History of noncompliance with medications.  - Continue amlodipine. Change Clonidine to Carvedilol which would also address PVC's/? NSVT  - EKG shows sinus rhythm with LVH and repolarization abnormalities, prolonged QTC.  - Continue monitoring on telemetry.   Anemia  - Likely secondary to ESRD  - Transfuse if hemoglobin less than 7 g per DL.  - Stable  - Management per nephrology: Aranesp etc..   Hyperlipidemia  - Consider statins.   Secondary hyperparathyroidism  - Management per nephrology.   Mild Cardiomyopathy/Mild AoC Systolic CHF on admission  - ? Nonischemic.  - LVEF 45-50% with diffuse LV hypokinesis  - Probably no further w/u except continue BP control and repeat Echo in 3 months, but will get Cardiology consult to make  recommendations.  - Cardiology input appreciated- no further WU- continue HD, BP control, diet etc and follow up repeat Echo  - Concern of NSVT on Tele on 10/20. Reviewed tele: seem more like short run of PVC's rather than NSVT.  - Continue Telemetry.   DVT prophylaxis: Heparin   Code Status: Full   Family Communication: None  Disposition Plan: Home when medically stable, pending Nephrology clearance and OP HD arrangement. Requested CM to assist with OP PCP, pending medicaid number.  Consultants:  Nephrology  Vascular surgery  Procedures:  1. right IJ hemodialysis catheter with ultrasound visualization, 10/20 2. left wrist Cimino AV fistula 10/20  Lab Results  Component Value Date   PLT 161 07/18/2013    Medications  Scheduled Meds: . amLODipine  10 mg Oral Daily  . calcium acetate  1,334 mg Oral TID WC  . carvedilol  3.125 mg Oral BID WC  . ciprofloxacin  500 mg Oral Q breakfast  . darbepoetin (ARANESP) injection - DIALYSIS  60 mcg Intravenous Q Mon-HD  . fentaNYL  50 mcg Intravenous Once  . heparin  5,000 Units Subcutaneous Q8H  . metroNIDAZOLE  500 mg Oral Q8H  . sodium chloride  3 mL Intravenous Q12H   Continuous Infusions:  PRN Meds:.acetaminophen, acetaminophen, docusate sodium, heparin, HYDROcodone-acetaminophen, HYDROmorphone (DILAUDID) injection, ondansetron (ZOFRAN) IV, ondansetron  Antibiotics   Cipro 10/17 >  Flagyl 10/17 >   Anti-infectives   Start     Dose/Rate Route Frequency Ordered Stop   07/18/13 0800  ciprofloxacin (CIPRO) tablet 500 mg     500 mg Oral Daily with breakfast 07/17/13 1419     07/18/13 0716  cefUROXime (ZINACEF) 1.5 G injection    Comments:  Mumm, Valerie   : cabinet override      07/18/13 0716 07/18/13 0743   07/17/13 2200  metroNIDAZOLE (FLAGYL) tablet 500 mg     500 mg Oral 3 times per day 07/17/13 1419     07/15/13 1200  metroNIDAZOLE (FLAGYL) IVPB 500 mg  Status:  Discontinued     500 mg 100 mL/hr over 60 Minutes  Intravenous Every 8 hours 07/15/13 1147 07/17/13 1419   07/15/13 1200  ciprofloxacin (CIPRO) IVPB 400 mg  Status:  Discontinued     400 mg 200 mL/hr over 60 Minutes Intravenous Every 24 hours 07/15/13 1147 07/17/13 1419       Time Spent in minutes   35 minutes   Isis Costanza D.O. on 07/20/2013 at 3:43 PM  Between 7am to 7pm - Pager - [phone]  After 7pm go to [email or web address] - password [email or web address]  And look for the night coverage person covering for me after hours  [facility] Office  [phone]    Subjective:   Christopher Wang seen and examined today.  Has no complaints this morning. Patient denies dizziness, chest pain, shortness of breath, abdominal pain, N/V/D/C, new weakness, numbess, tingling.    Objective:   Filed Vitals:   07/19/13 1300 07/19/13 1635 07/19/13 2008 07/20/13 1011  BP: 145/88 100/53 101/52 153/71  Pulse: 88 80 80 91  Temp: 98.3 F (36.8 C) 97.9 F (36.6 C) 98.7 F (37.1 C) 98.6 F (37 C)  TempSrc:   Oral Oral  Resp: 18 16 18 18   Height:      Weight:   74.801 kg (164 lb 14.5 oz)   SpO2: 100% 97% 100% 100%    Wt Readings from Last 3 Encounters:  07/19/13 74.801 kg (164 lb 14.5 oz)  07/19/13 74.801 kg (164 lb 14.5 oz)  07/08/13 79.742 kg (175 lb 12.8 oz)     Intake/Output Summary (Last 24 hours) at 07/20/13 1543 Last data filed at 07/20/13 1400  Gross per 24 hour  Intake    480 ml  Output    400 ml  Net     80 ml    Exam  General: Well developed, well nourished, NAD, appears stated age  HEENT: NCAT, PERRLA, EOMI, Anicteic Sclera, mucous membranes moist.   Neck: Supple, no JVD, no masses,  Cardiovascular: S1 S2 auscultated, no rubs, murmurs or gallops.Regular rate and rhythm.  Respiratory: Clear to auscultation bilaterally with equal chest rise  Abdomen: Soft, nontender, nondistended, + bowel sounds  Extremities: warm dry without cyanosis clubbing or edema  Neuro: AAOx3, cranial nerves grossly intact.   Skin:  Without rashes exudates or nodules  Psych: Normal affect and demeanor with intact judgement and insight   Data Review   Micro Results Recent Results (from the past 240 hour(s))  URINE CULTURE     Status: None   Collection Time    07/15/13  10:52 AM      Result Value Range Status   Specimen Description URINE, CLEAN CATCH   Final   Special Requests NONE   Final   Culture  Setup Time     Final   Value: 07/15/2013 14:54     Performed at [place]     Final   Value: NO GROWTH     Performed at [facility]   Culture     Final   Value: NO GROWTH     Performed at [facility]   Report Status 07/16/2013 FINAL   Final    Radiology Reports Ct Abdomen Pelvis Wo Contrast  07/15/2013   CLINICAL DATA:  Abdominal pain.  EXAM: CT ABDOMEN AND PELVIS WITHOUT CONTRAST  TECHNIQUE: Multidetector CT imaging of the abdomen and pelvis was performed following the standard protocol without intravenous contrast.  COMPARISON:  None.  FINDINGS: Visualized lung bases appear normal. No gallstones are noted. The liver, spleen and pancreas appeared appear normal. However, there is noted fluid around the spleen. The appendix appears normal. There is noted significant wall and fold thickening involving proximal small bowel consistent with enteritis or other inflammation. Free fluid is also noted in the dependent portion of the pelvis. Urinary bladder appears normal. Adrenal glands appear normal. Mild bilateral renal atrophy is noted.  IMPRESSION: Mild bilateral renal atrophy. Severe wall and fold thickening of proximal small bowel is noted most consistent with inflammation or enteritis ; however infiltrative disease such as lymphoma cannot be excluded and clinical correlation is recommend. Small amount of free fluid is noted around the spleen as well as in the dependent portion of the pelvis.   Electronically Signed   By: Sabino Dick M.D.   On: 07/15/2013 10:11   Dg Chest 2  View  07/15/2013   CLINICAL DATA:  Shortness of breath  EXAM: CHEST  2 VIEW  COMPARISON:  Prior radiograph from 02/23/2012  FINDINGS: Cardiac and mediastinal silhouettes are stable in size and contour, and remain within normal limits.  Lungs are normally inflated without airspace consolidation, pleural effusion, or pulmonary edema. There is no pneumothorax.  Osseous structures are within normal limits.  IMPRESSION: No active cardiopulmonary disease.   Electronically Signed   By: Jeannine Boga M.D.   On: 07/15/2013 06:39   Dg Chest Port 1 View  07/18/2013   CLINICAL DATA:  Catheter placement  EXAM: PORTABLE CHEST - 1 VIEW  COMPARISON:  July 15, 2013  FINDINGS: Central catheter positioned with tips in right atrium. No pneumothorax. Lungs are clear. Heart size is upper normal with normal pulmonary vascularity. No adenopathy. There is some thoracic levoscoliosis.  IMPRESSION: Catheter tips in right atrium. No pneumothorax. No edema or consolidation.   Electronically Signed   By: Lowella Grip M.D.   On: 07/18/2013 10:29   Dg Fluoro Guide Cv Line-no Report  07/18/2013   CLINICAL DATA: Diatek catheter   FLOURO GUIDE CV LINE  Fluoroscopy was utilized by the requesting physician.  No radiographic  interpretation.     CBC  Recent Labs Lab 07/15/13 0610 07/16/13 0618 07/17/13 1240 07/18/13 0520  WBC 10.0 5.7 5.4 5.0  HGB 11.2* 8.5* 8.7* 8.4*  HCT 34.2* 26.0* 26.4* 25.5*  PLT 203 147* 144* 161  MCV 87.5 87.5 86.6 86.7  MCH 28.6 28.6 28.5 28.6  MCHC 32.7 32.7 33.0 32.9  RDW 13.3 13.5 13.0 12.8  LYMPHSABS 0.6*  --   --   --  MONOABS 0.2  --   --   --   EOSABS 0.0  --   --   --   BASOSABS 0.0  --   --   --     Chemistries   Recent Labs Lab 07/15/13 0610 07/16/13 0618 07/17/13 1240 07/18/13 0520 07/19/13 0428  NA 140 142 139 139 142  K 4.7 3.9 4.0 3.7 4.2  CL 99 103 101 101 105  CO2 20 21 22 22 24   GLUCOSE 112* 74 100* 87 94  BUN 96* 96* 97* 98* 68*  CREATININE  10.62* 11.78* 12.30* 12.16* 9.68*  CALCIUM 7.3* 7.1* 7.1* 7.0* 6.9*  MG  --  2.2  --   --   --   AST 14  --   --   --   --   ALT 9  --   --   --   --   ALKPHOS 67  --   --   --   --   BILITOT 0.4  --   --   --   --    ------------------------------------------------------------------------------------------------------------------ estimated creatinine clearance is 12.3 ml/min (by C-G formula based on Cr of 9.68). ------------------------------------------------------------------------------------------------------------------ No results found for this basename: HGBA1C,  in the last 72 hours ------------------------------------------------------------------------------------------------------------------ No results found for this basename: CHOL, HDL, LDLCALC, TRIG, CHOLHDL, LDLDIRECT,  in the last 72 hours ------------------------------------------------------------------------------------------------------------------ No results found for this basename: TSH, T4TOTAL, FREET3, T3FREE, THYROIDAB,  in the last 72 hours ------------------------------------------------------------------------------------------------------------------  Recent Labs  07/18/13 1430  FERRITIN 273  TIBC 177*  IRON 108    Coagulation profile No results found for this basename: INR, PROTIME,  in the last 168 hours  No results found for this basename: DDIMER,  in the last 72 hours  Cardiac Enzymes No results found for this basename: CK, CKMB, TROPONINI, MYOGLOBIN,  in the last 168 hours ------------------------------------------------------------------------------------------------------------------ No components found with this basename: POCBNP,

## 2013-07-20 NOTE — Progress Notes (Signed)
07/20/2013 2:45 PM Hemodialysis Outpatient Note: this patient has been accepted at the [facility] on a Tuesday Thursday Saturday 2nd shift schedule. The patient can begin Thursday October 23rd if transportation issues have been resolved. Please have patient arrive at the center for 11:00 AM to sign paperwork and consents. Thank you. Christopher Wang

## 2013-07-20 NOTE — Progress Notes (Signed)
Patient ID: Christopher Wang, male   DOB: 1986-05-19, 27 y.o.   MRN: [id number]  [facility] Progress Note    Subjective:   No new complaints   Objective:   BP 153/71  Pulse 91  Temp(Src) 98.6 F (37 C) (Oral)  Resp 18  Ht 5\' 11"  (1.803 m)  Wt 74.801 kg (164 lb 14.5 oz)  BMI 23.01 kg/m2  SpO2 100%  Intake/Output: I/O last 3 completed shifts: In: 840 [P.O.:840] Out: [id number] [Urine:800; Other:992]   Intake/Output this shift:    Weight change: -1.4 kg (-3 lb 1.4 oz)  Physical Exam: Gen:WD AAM with flat affect in NAD CVS:no rub Resp:cta [id number] Ext:no edema, LFA Cimino AVF +T/B  Labs: BMET  Recent Labs Lab 07/15/13 0610 07/16/13 0618 07/17/13 1240 07/18/13 0520 07/19/13 0428  NA 140 142 139 139 142  K 4.7 3.9 4.0 3.7 4.2  CL 99 103 101 101 105  CO2 20 21 22 22 24   GLUCOSE 112* 74 100* 87 94  BUN 96* 96* 97* 98* 68*  CREATININE 10.62* 11.78* 12.30* 12.16* 9.68*  ALBUMIN 4.0 3.0* 3.1* 3.1* 2.8*  CALCIUM 7.3* 7.1* 7.1* 7.0* 6.9*  PHOS  --  8.1* 7.0* 7.4* 6.0*   CBC  Recent Labs Lab 07/15/13 0610 07/16/13 0618 07/17/13 1240 07/18/13 0520  WBC 10.0 5.7 5.4 5.0  NEUTROABS 9.2*  --   --   --   HGB 11.2* 8.5* 8.7* 8.4*  HCT 34.2* 26.0* 26.4* 25.5*  MCV 87.5 87.5 86.6 86.7  PLT 203 147* 144* 161    @IMGRELPRIORS @ Medications:    . amLODipine  10 mg Oral Daily  . calcium acetate  1,334 mg Oral TID WC  . carvedilol  3.125 mg Oral BID WC  . ciprofloxacin  500 mg Oral Q breakfast  . darbepoetin (ARANESP) injection - DIALYSIS  60 mcg Intravenous Q Mon-HD  . fentaNYL  50 mcg Intravenous Once  . heparin  5,000 Units Subcutaneous Q8H  . metroNIDAZOLE  500 mg Oral Q8H  . sodium chloride  3 mL Intravenous Q12H  1.   Assessment/ Plan:   1. 2 episodes of V Tach- w/u per primary svc 2. ESRD:cont with HD.  Plan for HD tomorrow and cont with TTS 3. 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 Nutrition:follow 6. Hypertension:stable 7. Dispo- awaiting emergency medicaid for outpt HD.  Christopher Wang A 07/20/2013, 11:25 AM

## 2013-07-21 DIAGNOSIS — E876 Hypokalemia: Secondary | ICD-10-CM

## 2013-07-21 LAB — CBC
HCT: 25.5 % — ABNORMAL LOW (ref 39.0–52.0)
Hemoglobin: 8.3 g/dL — ABNORMAL LOW (ref 13.0–17.0)
MCH: 29.9 pg (ref 26.0–34.0)
MCHC: 32.5 g/dL (ref 30.0–36.0)
MCV: 91.7 fL (ref 78.0–100.0)
Platelets: 144 10*3/uL — ABNORMAL LOW (ref 150–400)
RBC: 2.78 MIL/uL — ABNORMAL LOW (ref 4.22–5.81)
RDW: 13.1 % (ref 11.5–15.5)
WBC: 7.2 10*3/uL (ref 4.0–10.5)

## 2013-07-21 LAB — RENAL FUNCTION PANEL
Albumin: 2.9 g/dL — ABNORMAL LOW (ref 3.5–5.2)
BUN: 59 mg/dL — ABNORMAL HIGH (ref 6–23)
CO2: 25 mEq/L (ref 19–32)
Calcium: 8.3 mg/dL — ABNORMAL LOW (ref 8.4–10.5)
Chloride: 100 mEq/L (ref 96–112)
Creatinine, Ser: 9.25 mg/dL — ABNORMAL HIGH (ref 0.50–1.35)
GFR calc Af Amer: 8 mL/min — ABNORMAL LOW (ref >90)
GFR calc non Af Amer: 7 mL/min — ABNORMAL LOW (ref >90)
Glucose, Bld: 93 mg/dL (ref 70–99)
Phosphorus: 5.9 mg/dL — ABNORMAL HIGH (ref 2.3–4.6)
Potassium: 4.3 mEq/L (ref 3.5–5.1)
Sodium: 138 mEq/L (ref 135–145)

## 2013-07-21 MED ORDER — IBUPROFEN 600 MG PO TABS
600.0000 mg | ORAL_TABLET | ORAL | Status: DC | PRN
  Administered 2013-07-21: 600 mg via ORAL
  Filled 2013-07-21 (×2): qty 1

## 2013-07-21 MED ORDER — HEPARIN SODIUM (PORCINE) 1000 UNIT/ML DIALYSIS
20.0000 [IU]/kg | INTRAMUSCULAR | Status: DC | PRN
  Administered 2013-07-21: 1500 [IU] via INTRAVENOUS_CENTRAL

## 2013-07-21 MED ORDER — ACETAMINOPHEN 325 MG PO TABS
ORAL_TABLET | ORAL | Status: AC
  Filled 2013-07-21: qty 2

## 2013-07-21 NOTE — Procedures (Signed)
Patient was seen on dialysis and the procedure was supervised. BFR 400 Via RIJ PC BP is 126/82.  Patient appears to be tolerating treatment well

## 2013-07-21 NOTE — Progress Notes (Signed)
Patient ID: Christopher Wang, male   DOB: [birth year], 27 y.o.   MRN: [id number]  [facility] Progress Note    Subjective:   No new complaints   Objective:   BP 126/82  Pulse 78  Temp(Src) 98.6 F (37 C) (Oral)  Resp 14  Ht 5\' 11"  (1.803 m)  Wt 75.9 kg (167 lb 5.3 oz)  BMI 23.35 kg/m2  SpO2 98%  Intake/Output: I/O last 3 completed shifts: In: 1320 [P.O.:1320] Out: 1130 [Urine:1130]   Intake/Output this shift:    Weight change: -1.499 kg (-3 lb 4.9 oz)  Physical Exam: Gen:WD WN AAM in NAD CVS:no rub Resp:cta [id number] Ext:no edema Vasc Access:  RIJ PC, L Cimino AVF +T/B  Labs: BMET  Recent Labs Lab 07/15/13 0610 07/16/13 0618 07/17/13 1240 07/18/13 0520 07/19/13 0428 07/21/13 0515  NA 140 142 139 139 142 138  K 4.7 3.9 4.0 3.7 4.2 4.3  CL 99 103 101 101 105 100  CO2 20 21 22 22 24 25   GLUCOSE 112* 74 100* 87 94 93  BUN 96* 96* 97* 98* 68* 59*  CREATININE 10.62* 11.78* 12.30* 12.16* 9.68* 9.25*  ALBUMIN 4.0 3.0* 3.1* 3.1* 2.8* 2.9*  CALCIUM 7.3* 7.1* 7.1* 7.0* 6.9* 8.3*  PHOS  --  8.1* 7.0* 7.4* 6.0* 5.9*   CBC  Recent Labs Lab 07/15/13 0610 07/16/13 0618 07/17/13 1240 07/18/13 0520 07/21/13 0515  WBC 10.0 5.7 5.4 5.0 7.2  NEUTROABS 9.2*  --   --   --   --   HGB 11.2* 8.5* 8.7* 8.4* 8.3*  HCT 34.2* 26.0* 26.4* 25.5* 25.5*  MCV 87.5 87.5 86.6 86.7 91.7  PLT 203 147* 144* 161 144*    @IMGRELPRIORS @ Medications:    . amLODipine  10 mg Oral Daily  . calcium acetate  1,334 mg Oral TID WC  . carvedilol  3.125 mg Oral BID WC  . ciprofloxacin  500 mg Oral Q breakfast  . darbepoetin (ARANESP) injection - DIALYSIS  60 mcg Intravenous Q Mon-HD  . fentaNYL  50 mcg Intravenous Once  . heparin  5,000 Units Subcutaneous Q8H  . metroNIDAZOLE  500 mg Oral Q8H  . sodium chloride  3 mL Intravenous Q12H     Assessment/ Plan:   1. Asymptomatic episodes of V Tach x 2- w/u per primary svc, none documented over the last 24 hours per nsg  notes 2. ESRD:cont with HD. TTS arranged for [place] 2nd shift.  Will need to go there tomorrow to sign papers so he can start 07/23/13 3. Anemia:cont with ESA 4. 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 Nutrition:follow 6. Hypertension:stable 7. Dispo- has received emergency medicaid for outpt HD and is stable from renal standpoint to be discharged today.  He will need to sign papers at [facility] tomorrow or later today in order to start outpt HD on 07/23/13.    Christopher Wang A 07/21/2013, 8:17 AM

## 2013-07-21 NOTE — Progress Notes (Signed)
[facility]                                                                                Patient Demographics  Christopher Wang, is a 27 y.o. male, DOB - 02/22/1986, [id number]  Admit date - 07/15/2013   Admitting Physician Ripudeep Krystal Eaton, MD  Outpatient Primary MD for the patient is Lucrezia Starch, MD  LOS - 6   Chief Complaint  Patient presents with  . 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 Patient will dialyze today.  - Vascular surgery signed off after creating access for HD.  - S/P Right IJ and Left wrist AV fistula placement on 10/20   Abdominal pain/enteritis on CT  - Patient denies sickly contacts or antibiotic use.  - Improved.  - Continue empiric Cipro and Flagyl  - Will need followup CT abdomen to ensure resolution of findings because question of lymphoma was raised on CT   Hypertension  - Mildly uncontrolled.  - History of noncompliance with medications.  - Continue amlodipine. Change Clonidine to Carvedilol which would also address PVC's/? NSVT  - EKG shows sinus rhythm with LVH and repolarization abnormalities, prolonged QTC.  - Continue monitoring on telemetry.   Anemia  - Likely secondary to ESRD  - Transfuse if hemoglobin less than 7 g per DL.  - Stable  - Management per nephrology: Aranesp etc..   Hyperlipidemia  - Consider statins.   Secondary hyperparathyroidism  - Management per nephrology.   Mild Cardiomyopathy/Mild AoC Systolic CHF on admission  - ? Nonischemic.  - LVEF 45-50% with diffuse LV hypokinesis  - Probably no further w/u except continue BP control and repeat Echo in 3 months, but will get Cardiology consult to make recommendations.  -  Cardiology input appreciated- no further WU- continue HD, BP control, diet etc and follow up repeat Echo  - Concern of NSVT on Tele on 10/20. Reviewed tele: seem more like short run of PVC's rather than NSVT.  - Continue Telemetry.   DVT prophylaxis: Heparin   Code Status: Full   Family Communication: None  Disposition Plan: Home when medically stable, pending Nephrology clearance and OP HD arrangement. Requested CM to assist with OP PCP, pending medicaid number.  Consultants:  Nephrology  Vascular surgery  Procedures:  1. right IJ hemodialysis catheter with ultrasound visualization, 10/20 2. left wrist Cimino AV fistula 10/20  Lab Results  Component Value Date   PLT 144* 07/21/2013    Medications  Scheduled Meds: . amLODipine  10 mg Oral Daily  . calcium acetate  1,334 mg Oral TID WC  . carvedilol  3.125 mg Oral BID WC  . ciprofloxacin  500 mg Oral Q breakfast  . darbepoetin (ARANESP) injection - DIALYSIS  60 mcg Intravenous Q Mon-HD  . fentaNYL  50 mcg Intravenous Once  . heparin  5,000 Units Subcutaneous Q8H  .  metroNIDAZOLE  500 mg Oral Q8H  . sodium chloride  3 mL Intravenous Q12H   Continuous Infusions:  PRN Meds:.acetaminophen, acetaminophen, docusate sodium, HYDROcodone-acetaminophen, HYDROmorphone (DILAUDID) injection, ondansetron (ZOFRAN) IV, ondansetron  Antibiotics   Cipro 10/17 >  Flagyl 10/17 >   Anti-infectives   Start     Dose/Rate Route Frequency Ordered Stop   07/18/13 0800  ciprofloxacin (CIPRO) tablet 500 mg     500 mg Oral Daily with breakfast 07/17/13 1419     07/18/13 0716  cefUROXime (ZINACEF) 1.5 G injection    Comments:  Mumm, Valerie   : cabinet override      07/18/13 0716 07/18/13 0743   07/17/13 2200  metroNIDAZOLE (FLAGYL) tablet 500 mg     500 mg Oral 3 times per day 07/17/13 1419     07/15/13 1200  metroNIDAZOLE (FLAGYL) IVPB 500 mg  Status:  Discontinued     500 mg 100 mL/hr over 60 Minutes Intravenous Every 8 hours 07/15/13  1147 07/17/13 1419   07/15/13 1200  ciprofloxacin (CIPRO) IVPB 400 mg  Status:  Discontinued     400 mg 200 mL/hr over 60 Minutes Intravenous Every 24 hours 07/15/13 1147 07/17/13 1419       Time Spent in minutes   20 minutes   Sieara Bremer D.O. on 07/21/2013 at 12:25 PM  Between 7am to 7pm - Pager - [phone]  After 7pm go to [email or web address] - password [email or web address]  And look for the night coverage person covering for me after hours  [facility] Office  [phone]    Subjective:   [name] seen and examined today.  Patient complains of headache. He states he "doesn't feel well". Patient denies dizziness, chest pain, shortness of breath, abdominal pain, N/V/D/C, new weakness, numbess, tingling.    Objective:   Filed Vitals:   07/21/13 1000 07/21/13 1030 07/21/13 1100 07/21/13 1132  BP: 133/89 128/87 101/43 112/58  Pulse: 88 80 76 67  Temp:    97.8 F (36.6 C)  TempSrc:    Oral  Resp:    16  Height:      Weight:    72.9 kg (160 lb 11.5 oz)  SpO2:    99%    Wt Readings from Last 3 Encounters:  07/21/13 72.9 kg (160 lb 11.5 oz)  07/21/13 72.9 kg (160 lb 11.5 oz)  07/08/13 79.742 kg (175 lb 12.8 oz)     Intake/Output Summary (Last 24 hours) at 07/21/13 1225 Last data filed at 07/21/13 1132  Gross per 24 hour  Intake    960 ml  Output   3292 ml  Net  -2332 ml    Exam  General: Well developed, well nourished, NAD, appears stated age  HEENT: NCAT, PERRLA, EOMI, Anicteic Sclera, mucous membranes moist.   Neck: Supple, no JVD, no masses  Cardiovascular: S1 S2 auscultated, no rubs, murmurs or gallops.Regular rate and rhythm.  Respiratory: Clear to auscultation bilaterally with equal chest rise  Abdomen: Soft, nontender, nondistended, + bowel sounds  Extremities: warm dry without cyanosis clubbing or edema  Neuro: AAOx3, cranial nerves grossly intact.   Skin: Without rashes exudates or nodules  Psych: Normal affect and demeanor with  intact judgement and insight   Data Review   Micro Results Recent Results (from the past 240 hour(s))  URINE CULTURE     Status: None   Collection Time    07/15/13 10:52 AM      Result Value Range Status   Specimen Description  URINE, CLEAN CATCH   Final   Special Requests NONE   Final   Culture  Setup Time     Final   Value: 07/15/2013 14:54     Performed at [facility]     Final   Value: NO GROWTH     Performed at [facility]   Culture     Final   Value: NO GROWTH     Performed at [facility]   Report Status 07/16/2013 FINAL   Final    Radiology Reports Ct Abdomen Pelvis Wo Contrast  07/15/2013   CLINICAL DATA:  Abdominal pain.  EXAM: CT ABDOMEN AND PELVIS WITHOUT CONTRAST  TECHNIQUE: Multidetector CT imaging of the abdomen and pelvis was performed following the standard protocol without intravenous contrast.  COMPARISON:  None.  FINDINGS: Visualized lung bases appear normal. No gallstones are noted. The liver, spleen and pancreas appeared appear normal. However, there is noted fluid around the spleen. The appendix appears normal. There is noted significant wall and fold thickening involving proximal small bowel consistent with enteritis or other inflammation. Free fluid is also noted in the dependent portion of the pelvis. Urinary bladder appears normal. Adrenal glands appear normal. Mild bilateral renal atrophy is noted.  IMPRESSION: Mild bilateral renal atrophy. Severe wall and fold thickening of proximal small bowel is noted most consistent with inflammation or enteritis ; however infiltrative disease such as lymphoma cannot be excluded and clinical correlation is recommend. Small amount of free fluid is noted around the spleen as well as in the dependent portion of the pelvis.   Electronically Signed   By: Sabino Dick M.D.   On: 07/15/2013 10:11   Dg Chest 2 View  07/15/2013   CLINICAL DATA:  Shortness of breath  EXAM: CHEST  2 VIEW   COMPARISON:  Prior radiograph from 02/23/2012  FINDINGS: Cardiac and mediastinal silhouettes are stable in size and contour, and remain within normal limits.  Lungs are normally inflated without airspace consolidation, pleural effusion, or pulmonary edema. There is no pneumothorax.  Osseous structures are within normal limits.  IMPRESSION: No active cardiopulmonary disease.   Electronically Signed   By: Jeannine Boga M.D.   On: 07/15/2013 06:39   Dg Chest Port 1 View  07/18/2013   CLINICAL DATA:  Catheter placement  EXAM: PORTABLE CHEST - 1 VIEW  COMPARISON:  July 15, 2013  FINDINGS: Central catheter positioned with tips in right atrium. No pneumothorax. Lungs are clear. Heart size is upper normal with normal pulmonary vascularity. No adenopathy. There is some thoracic levoscoliosis.  IMPRESSION: Catheter tips in right atrium. No pneumothorax. No edema or consolidation.   Electronically Signed   By: Lowella Grip M.D.   On: 07/18/2013 10:29   Dg Fluoro Guide Cv Line-no Report  07/18/2013   CLINICAL DATA: Diatek catheter   FLOURO GUIDE CV LINE  Fluoroscopy was utilized by the requesting physician.  No radiographic  interpretation.     CBC  Recent Labs Lab 07/15/13 0610 07/16/13 0618 07/17/13 1240 07/18/13 0520 07/21/13 0515  WBC 10.0 5.7 5.4 5.0 7.2  HGB 11.2* 8.5* 8.7* 8.4* 8.3*  HCT 34.2* 26.0* 26.4* 25.5* 25.5*  PLT 203 147* 144* 161 144*  MCV 87.5 87.5 86.6 86.7 91.7  MCH 28.6 28.6 28.5 28.6 29.9  MCHC 32.7 32.7 33.0 32.9 32.5  RDW 13.3 13.5 13.0 12.8 13.1  LYMPHSABS 0.6*  --   --   --   --  MONOABS 0.2  --   --   --   --   EOSABS 0.0  --   --   --   --   BASOSABS 0.0  --   --   --   --     Chemistries   Recent Labs Lab 07/15/13 0610 07/16/13 0618 07/17/13 1240 07/18/13 0520 07/19/13 0428 07/21/13 0515  NA 140 142 139 139 142 138  K 4.7 3.9 4.0 3.7 4.2 4.3  CL 99 103 101 101 105 100  CO2 20 21 22 22 24 25   GLUCOSE 112* 74 100* 87 94 93  BUN 96* 96*  97* 98* 68* 59*  CREATININE 10.62* 11.78* 12.30* 12.16* 9.68* 9.25*  CALCIUM 7.3* 7.1* 7.1* 7.0* 6.9* 8.3*  MG  --  2.2  --   --   --   --   AST 14  --   --   --   --   --   ALT 9  --   --   --   --   --   ALKPHOS 67  --   --   --   --   --   BILITOT 0.4  --   --   --   --   --    ------------------------------------------------------------------------------------------------------------------ estimated creatinine clearance is 12.6 ml/min (by C-G formula based on Cr of 9.25). ------------------------------------------------------------------------------------------------------------------ No results found for this basename: HGBA1C,  in the last 72 hours ------------------------------------------------------------------------------------------------------------------ No results found for this basename: CHOL, HDL, LDLCALC, TRIG, CHOLHDL, LDLDIRECT,  in the last 72 hours ------------------------------------------------------------------------------------------------------------------ No results found for this basename: TSH, T4TOTAL, FREET3, T3FREE, THYROIDAB,  in the last 72 hours ------------------------------------------------------------------------------------------------------------------  Recent Labs  07/18/13 1430  FERRITIN 273  TIBC 177*  IRON 108    Coagulation profile No results found for this basename: INR, PROTIME,  in the last 168 hours  No results found for this basename: DDIMER,  in the last 72 hours  Cardiac Enzymes No results found for this basename: CK, CKMB, TROPONINI, MYOGLOBIN,  in the last 168 hours ------------------------------------------------------------------------------------------------------------------ No components found with this basename: POCBNP,

## 2013-07-22 MED ORDER — CARVEDILOL 3.125 MG PO TABS: 3.1250 mg | ORAL_TABLET | Freq: Two times a day (BID) | ORAL | Status: DC

## 2013-07-22 MED ORDER — CIPROFLOXACIN HCL 500 MG PO TABS: 500.0000 mg | ORAL_TABLET | Freq: Every day | ORAL | Status: DC

## 2013-07-22 MED ORDER — CALCIUM ACETATE 667 MG PO CAPS: 1334.0000 mg | ORAL_CAPSULE | Freq: Three times a day (TID) | ORAL | Status: DC

## 2013-07-22 MED ORDER — METRONIDAZOLE 500 MG PO TABS: 500.0000 mg | ORAL_TABLET | Freq: Three times a day (TID) | ORAL | Status: DC

## 2013-07-22 NOTE — Progress Notes (Signed)
Pt discharged to home after visit summary reviewed and pt capable of re verbalizing medications and follow up appointments. Pt remains stable. No signs and symptoms of distress. Educated to return to ER in the event of SOB, dizziness, chest pain, or fainting. Josaphine Shimamoto, RN   

## 2013-07-22 NOTE — Discharge Summary (Addendum)
Physician Discharge Summary  Christopher Wang [id number] DOB: 17-Oct-1985 DOA: 07/15/2013  PCP: Lucrezia Starch, MD  Admit date: 07/15/2013 Discharge date: 07/22/2013  Time spent: 45 minutes  Recommendations for Outpatient Follow-up:  Patient should follow up with his primary care physician Dr. Lorrene Reid week of discharge. Patient should also follow up with Dr. Donnetta Hutching, vascular surgery, office will contact patient to set up this appointment. Patient is also focal Dr. Marval Regal within 2 weeks of discharge. Dr. Acie Fredrickson, cardiologist, as needed.  Patient is medications as prescribed.  Patient will need to sign paperwork at the dialysis center off of [facility], today by 4:30pm.    Discharge Diagnoses:  Principal Problem:   ESRD needing dialysis Active Problems:   Hyperlipidemia   Hypertension   FSGS (focal segmental glomerulosclerosis) with nephrosis   Uremia   Enteritis   Anemia in chronic kidney disease   Discharge Condition: Stable  Diet recommendation: Renal Diet  Filed Weights   07/21/13 0700 07/21/13 1132 07/21/13 2144  Weight: 75.9 kg (167 lb 5.3 oz) 72.9 kg (160 lb 11.5 oz) 72.893 kg (160 lb 11.2 oz)    History of present illness:  Patient is a 27 year old male with history of hypertension, chronic kidney disease, nephrotic syndrome, biopsy proven collapsing FSGS (idiopathic) in 2012, non HIV. Per Dr. Lorrene Reid, who knows the patient very well, he was supposed to be on investigational drug, Acthar, he took it for a month or so and then subsequently was not picking up prescriptions. He is extremely noncompliant, he saw Dr. Lorrene Reid in the office 2 weeks ago and at that time creatinine was 8.0. Patient was sent to vascular evaluation, was seen by Dr. Bridgett Larsson on 10/10 and was explained that he would need fistula/permanent access placement and dialysis.  Patient is in to the ER today with intractable nausea and vomiting since yesterday. Patient reports that he went to [employer] yesterday with his friends and had macaroni and chicken wings with iced tea. He reports he started having nausea, vomiting and abdominal discomfort after that. He denies similar symptoms in his friends. He denied any diarrhea or any hematemesis.  He also reported that he felt short of breath with the episode of vomiting. He states that he still has normal urine output. ER workup showed a BUN of 96, creatinine of 10.62 (was 8.0, 2 weeks ago), WBCs 10.0, neutrophils 92%, lipase 168  CT abdomen and pelvis showed a mild bilateral renal atrophic, enteritis of the proximal small bowel, however infiltrative disease such as lymphoma cannot be excluded.   Hospital Course:  This is a 27 year old male history of hypertension, chronic kidney disease with nephrotic syndrome, biopsy in 2000 showing FSGS, who presents emergency department with nausea, vomiting, shortness of breath. Patient has been shown to be noncompliant in past with his blood pressure medications as well as his hyperlipidemia medications. He was on investigational medication Acthar, however patient failed to comply with his appointments. Patient was noted to have a creatinine of 10.6 upon admission, nephrology was consulted and hemodialysis was started. Patient did receive vascular access as well. Patient was also noted to have possible small bowel enteritis on CT. At which point he was started on empiric ciprofloxacin as well as Flagyl. Patient will continue duration of his antibiotic treatment as an outpatient. Patient will need a followup CT of the abdomen for questionable lymphoma that was raised on CT. Patient was also found to have anemia however this did remain stable during his hospital  course. He was also found to have questionable runs of fever and mature ventricular contractions versus an NSVT.  Patient was continued on telemetry however this had resolved.  Patient was also noted to have an EF of [id number] with mild systolic CHF  secondary to his end-stage renal disease. Cardiology was consulted and felt that this would be remedied by dialysis. Patient will followup with Dr. Acie Fredrickson as an outpatient. Repeat echocardiogram may be needed within a few months of starting dialysis to assess for improvement of patient's EF. Patient will be set up with the match program. He will also be enrolled in the Medicaid program.  Procedures: - Right IJ hemodialysis catheter with ultrasound visualization, 10/20  - Left wrist Cimino AV fistula 10/20 -Echocardiogram  Impressions :No prior study for comparison. Mild LVH with LVEF Q000111Q, grade 2 diastolic dysfunction. Moderate left atrial enlargement. Mild tricuspid regurgitation. Unable to assess PASP. Normal in normal range.  Consultations: Nephrology Vascular surgery Cardiology  Discharge Exam: Filed Vitals:   07/22/13 0902  BP: 143/94  Pulse: 89  Temp: 98.1 F (36.7 C)  Resp: 18      Exam  General: Well developed, well nourished, NAD, appears stated age  HEENT: NCAT, PERRLA, EOMI, Anicteic Sclera, mucous membranes moist.  Neck: Supple, no JVD, no masses  Cardiovascular: S1 S2 auscultated, no rubs, murmurs or gallops.Regular rate and rhythm.  Respiratory: Clear to auscultation bilaterally with equal chest rise  Abdomen: Soft, nontender, nondistended, + bowel sounds  Extremities: warm dry without cyanosis clubbing or edema  Neuro: AAOx3, cranial nerves grossly intact.  Skin: Without rashes exudates or nodules  Psych: Normal affect and demeanor with intact judgement and insight  Discharge Instructions  Discharge Orders   Future Appointments Provider Department Dept Phone   09/06/2013 11:00 AM [facility] [place] [id number]   09/06/2013 12:15 PM Rosetta Posner, MD [facility] [phone]   Future Orders Complete By Expires   Diet - low sodium heart healthy  As directed    Discharge instructions  As  directed    Comments:     Patient should follow up with his primary care physician Dr. Buelah Manis week of discharge. Patient should also follow up with Dr. Donnetta Hutching, vascular surgery, office will contact patient to set up this appointment. Patient is also focal Dr. Marval Regal within 2 weeks of discharge. Patient is medications as prescribed.   Increase activity slowly  As directed        Medication List         amLODipine 10 MG tablet  Commonly known as:  NORVASC  Take 10 mg by mouth daily.     calcium acetate 667 MG capsule  Commonly known as:  PHOSLO  Take 2 capsules (1,334 mg total) by mouth 3 (three) times daily with meals.     carvedilol 3.125 MG tablet  Commonly known as:  COREG  Take 1 tablet (3.125 mg total) by mouth 2 (two) times daily with a meal.     ciprofloxacin 500 MG tablet  Commonly known as:  CIPRO  Take 1 tablet (500 mg total) by mouth daily with breakfast.     metroNIDAZOLE 500 MG tablet  Commonly known as:  FLAGYL  Take 1 tablet (500 mg total) by mouth every 8 (eight) hours.       No Known Allergies     Follow-up Information   Follow up with EARLY, TODD, MD In 6 weeks. (office will arrange-sent)  Specialty:  Vascular Surgery   Contact information:   [address] [place] [address] [phone]       Follow up with COLADONATO,JOSEPH A, MD. Schedule an appointment as soon as possible for a visit in 2 weeks.   Specialty:  Nephrology   Contact information:   [place] [address] [phone]       Follow up with DUNHAM,CYNTHIA B, MD. Schedule an appointment as soon as possible for a visit in 1 week.   Specialty:  Nephrology   Contact information:   [place] [address] [phone]        The results of significant diagnostics from this hospitalization (including imaging, microbiology, ancillary and laboratory) are listed below for reference.    Significant  Diagnostic Studies: Ct Abdomen Pelvis Wo Contrast  07/15/2013   CLINICAL DATA:  Abdominal pain.  EXAM: CT ABDOMEN AND PELVIS WITHOUT CONTRAST  TECHNIQUE: Multidetector CT imaging of the abdomen and pelvis was performed following the standard protocol without intravenous contrast.  COMPARISON:  None.  FINDINGS: Visualized lung bases appear normal. No gallstones are noted. The liver, spleen and pancreas appeared appear normal. However, there is noted fluid around the spleen. The appendix appears normal. There is noted significant wall and fold thickening involving proximal small bowel consistent with enteritis or other inflammation. Free fluid is also noted in the dependent portion of the pelvis. Urinary bladder appears normal. Adrenal glands appear normal. Mild bilateral renal atrophy is noted.  IMPRESSION: Mild bilateral renal atrophy. Severe wall and fold thickening of proximal small bowel is noted most consistent with inflammation or enteritis ; however infiltrative disease such as lymphoma cannot be excluded and clinical correlation is recommend. Small amount of free fluid is noted around the spleen as well as in the dependent portion of the pelvis.   Electronically Signed   By: Sabino Dick M.D.   On: 07/15/2013 10:11   Dg Chest 2 View  07/15/2013   CLINICAL DATA:  Shortness of breath  EXAM: CHEST  2 VIEW  COMPARISON:  Prior radiograph from 02/23/2012  FINDINGS: Cardiac and mediastinal silhouettes are stable in size and contour, and remain within normal limits.  Lungs are normally inflated without airspace consolidation, pleural effusion, or pulmonary edema. There is no pneumothorax.  Osseous structures are within normal limits.  IMPRESSION: No active cardiopulmonary disease.   Electronically Signed   By: Jeannine Boga M.D.   On: 07/15/2013 06:39   Dg Chest Port 1 View  07/18/2013   CLINICAL DATA:  Catheter placement  EXAM: PORTABLE CHEST - 1 VIEW  COMPARISON:  July 15, 2013  FINDINGS:  Central catheter positioned with tips in right atrium. No pneumothorax. Lungs are clear. Heart size is upper normal with normal pulmonary vascularity. No adenopathy. There is some thoracic levoscoliosis.  IMPRESSION: Catheter tips in right atrium. No pneumothorax. No edema or consolidation.   Electronically Signed   By: Lowella Grip M.D.   On: 07/18/2013 10:29   Dg Fluoro Guide Cv Line-no Report  07/18/2013   CLINICAL DATA: Diatek catheter   FLOURO GUIDE CV LINE  Fluoroscopy was utilized by the requesting physician.  No radiographic  interpretation.     Microbiology: Recent Results (from the past 240 hour(s))  URINE CULTURE     Status: None   Collection Time    07/15/13 10:52 AM      Result Value Range Status   Specimen Description URINE, CLEAN CATCH  Final   Special Requests NONE   Final   Culture  Setup Time     Final   Value: 07/15/2013 14:54     Performed at [facility]     Final   Value: NO GROWTH     Performed at [facility]   Culture     Final   Value: NO GROWTH     Performed at [facility]   Report Status 07/16/2013 FINAL   Final     Labs: Basic Metabolic Panel:  Recent Labs Lab 07/16/13 0618 07/17/13 1240 07/18/13 0520 07/19/13 0428 07/21/13 0515  NA 142 139 139 142 138  K 3.9 4.0 3.7 4.2 4.3  CL 103 101 101 105 100  CO2 21 22 22 24 25   GLUCOSE 74 100* 87 94 93  BUN 96* 97* 98* 68* 59*  CREATININE 11.78* 12.30* 12.16* 9.68* 9.25*  CALCIUM 7.1* 7.1* 7.0* 6.9* 8.3*  MG 2.2  --   --   --   --   PHOS 8.1* 7.0* 7.4* 6.0* 5.9*   Liver Function Tests:  Recent Labs Lab 07/16/13 0618 07/17/13 1240 07/18/13 0520 07/19/13 0428 07/21/13 0515  ALBUMIN 3.0* 3.1* 3.1* 2.8* 2.9*    Recent Labs Lab 07/17/13 1240  LIPASE 43   No results found for this basename: AMMONIA,  in the last 168 hours CBC:  Recent Labs Lab 07/16/13 0618 07/17/13 1240 07/18/13 0520 07/21/13 0515  WBC 5.7 5.4 5.0 7.2  HGB 8.5* 8.7*  8.4* 8.3*  HCT 26.0* 26.4* 25.5* 25.5*  MCV 87.5 86.6 86.7 91.7  PLT 147* 144* 161 144*   Cardiac Enzymes: No results found for this basename: CKTOTAL, CKMB, CKMBINDEX, TROPONINI,  in the last 168 hours BNP: BNP (last 3 results) No results found for this basename: PROBNP,  in the last 8760 hours CBG: No results found for this basename: GLUCAP,  in the last 168 hours     Signed:  Cristal Ford  [facility] 07/22/2013, 10:30 AM

## 2013-07-22 NOTE — Progress Notes (Signed)
Patient ID: CORINTHIANS REGEN, male   DOB: [birth year], 27 y.o.   MRN: [id number]  [facility] Progress Note    Subjective:   C/o HA today.    Objective:   BP 127/87  Pulse 86  Temp(Src) 98.1 F (36.7 C) (Oral)  Resp 18  Ht 5\' 11"  (1.803 m)  Wt 72.893 kg (160 lb 11.2 oz)  BMI 22.42 kg/m2  SpO2 99%  Intake/Output: I/O last 3 completed shifts: In: 840 [P.O.:840] Out: 3067 [Urine:1105; Other:1962]   Intake/Output this shift:    Weight change: -1.901 kg (-4 lb 3.1 oz)  Physical Exam: Gen:WD WN AAF  CVS:no rub Resp:cta Abd:+bs soft NT ND Ext:no edema. LAVF +T/B  Labs: BMET  Recent Labs Lab 07/16/13 0618 07/17/13 1240 07/18/13 0520 07/19/13 0428 07/21/13 0515  NA 142 139 139 142 138  K 3.9 4.0 3.7 4.2 4.3  CL 103 101 101 105 100  CO2 21 22 22 24 25   GLUCOSE 74 100* 87 94 93  BUN 96* 97* 98* 68* 59*  CREATININE 11.78* 12.30* 12.16* 9.68* 9.25*  ALBUMIN 3.0* 3.1* 3.1* 2.8* 2.9*  CALCIUM 7.1* 7.1* 7.0* 6.9* 8.3*  PHOS 8.1* 7.0* 7.4* 6.0* 5.9*   CBC  Recent Labs Lab 07/16/13 0618 07/17/13 1240 07/18/13 0520 07/21/13 0515  WBC 5.7 5.4 5.0 7.2  HGB 8.5* 8.7* 8.4* 8.3*  HCT 26.0* 26.4* 25.5* 25.5*  MCV 87.5 86.6 86.7 91.7  PLT 147* 144* 161 144*    @IMGRELPRIORS @ Medications:    . amLODipine  10 mg Oral Daily  . calcium acetate  1,334 mg Oral TID WC  . carvedilol  3.125 mg Oral BID WC  . ciprofloxacin  500 mg Oral Q breakfast  . darbepoetin (ARANESP) injection - DIALYSIS  60 mcg Intravenous Q Mon-HD  . fentaNYL  50 mcg Intravenous Once  . heparin  5,000 Units Subcutaneous Q8H  . metroNIDAZOLE  500 mg Oral Q8H  . sodium chloride  3 mL Intravenous Q12H     Assessment/ Plan:   1. Arrythmia- Asymptomatic agree with carvedilol- w/u per primary svc, none documented over the last 24 hours per nsg notes 2. ESRD:cont with HD. TTS arranged for [facility] 2nd shift. Will need to go there today to sign papers so he can start 07/23/13: if he gets to  the [place] [address] dialysis today before end of day otherwise will need to stay for HD tomorrow and start Tuesday.  Stable for d/c from our standpoint today. 3. Anemia:cont with ESA 4. 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 Nutrition:follow 6. Hypertension:stable 7. Dispo- has received emergency medicaid for outpt HD and is stable from renal standpoint to be discharged today. He will need to sign papers at [facility] today in order to start outpt HD on 07/23/13.     Ayaka Andes A 07/22/2013, 8:45 AM

## 2013-08-02 ENCOUNTER — Emergency Department (HOSPITAL_COMMUNITY): Payer: Medicaid Other

## 2013-08-02 ENCOUNTER — Inpatient Hospital Stay (HOSPITAL_COMMUNITY)
Admission: EM | Admit: 2013-08-02 | Discharge: 2013-08-03 | DRG: 312 | Disposition: A | Discharge status: Home / Self Care | Payer: Medicaid Other | Attending: Internal Medicine | Admitting: Internal Medicine

## 2013-08-02 ENCOUNTER — Encounter (HOSPITAL_COMMUNITY): Payer: Self-pay | Admitting: Emergency Medicine

## 2013-08-02 DIAGNOSIS — E876 Hypokalemia: Secondary | ICD-10-CM

## 2013-08-02 DIAGNOSIS — I953 Hypotension of hemodialysis: Principal | ICD-10-CM | POA: Diagnosis present

## 2013-08-02 DIAGNOSIS — I120 Hypertensive chronic kidney disease with stage 5 chronic kidney disease or end stage renal disease: Secondary | ICD-10-CM | POA: Diagnosis present

## 2013-08-02 DIAGNOSIS — Z992 Dependence on renal dialysis: Secondary | ICD-10-CM

## 2013-08-02 DIAGNOSIS — R55 Syncope and collapse: Secondary | ICD-10-CM | POA: Diagnosis present

## 2013-08-02 DIAGNOSIS — R296 Repeated falls: Secondary | ICD-10-CM | POA: Diagnosis present

## 2013-08-02 DIAGNOSIS — N184 Chronic kidney disease, stage 4 (severe): Secondary | ICD-10-CM

## 2013-08-02 DIAGNOSIS — N186 End stage renal disease: Secondary | ICD-10-CM | POA: Diagnosis present

## 2013-08-02 DIAGNOSIS — R9431 Abnormal electrocardiogram [ECG] [EKG]: Secondary | ICD-10-CM

## 2013-08-02 DIAGNOSIS — E861 Hypovolemia: Secondary | ICD-10-CM | POA: Diagnosis present

## 2013-08-02 DIAGNOSIS — I959 Hypotension, unspecified: Secondary | ICD-10-CM

## 2013-08-02 DIAGNOSIS — N189 Chronic kidney disease, unspecified: Secondary | ICD-10-CM

## 2013-08-02 DIAGNOSIS — D631 Anemia in chronic kidney disease: Secondary | ICD-10-CM

## 2013-08-02 LAB — URINALYSIS, ROUTINE W REFLEX MICROSCOPIC
Glucose, UA: NEGATIVE mg/dL
Ketones, ur: NEGATIVE mg/dL
Leukocytes, UA: NEGATIVE
Nitrite: NEGATIVE
Protein, ur: >300 mg/dL — AB
Specific Gravity, Urine: 1.017 (ref 1.005–1.030)
Urobilinogen, UA: 0.2 mg/dL (ref 0.0–1.0)
pH: 6 (ref 5.0–8.0)

## 2013-08-02 LAB — TYPE AND SCREEN
ABO/RH(D): O POS
Antibody Screen: NEGATIVE

## 2013-08-02 LAB — URINE MICROSCOPIC-ADD ON

## 2013-08-02 LAB — IRON AND TIBC
Iron: 108 ug/dL (ref 42–135)
Saturation Ratios: 61 % — ABNORMAL HIGH (ref 20–55)
TIBC: 177 ug/dL — ABNORMAL LOW (ref 215–435)
UIBC: 69 ug/dL — ABNORMAL LOW (ref 125–400)

## 2013-08-02 LAB — CBC
HCT: 30.1 % — ABNORMAL LOW (ref 39.0–52.0)
Hemoglobin: 9.4 g/dL — ABNORMAL LOW (ref 13.0–17.0)
MCH: 29.1 pg (ref 26.0–34.0)
MCHC: 31.2 g/dL (ref 30.0–36.0)
MCV: 93.2 fL (ref 78.0–100.0)
Platelets: 203 10*3/uL (ref 150–400)
RBC: 3.23 MIL/uL — ABNORMAL LOW (ref 4.22–5.81)
RDW: 14.1 % (ref 11.5–15.5)
WBC: 9.7 10*3/uL (ref 4.0–10.5)

## 2013-08-02 LAB — BASIC METABOLIC PANEL
BUN: 16 mg/dL (ref 6–23)
CO2: 33 mEq/L — ABNORMAL HIGH (ref 19–32)
Calcium: 8.9 mg/dL (ref 8.4–10.5)
Chloride: 97 mEq/L (ref 96–112)
Creatinine, Ser: 4.12 mg/dL — ABNORMAL HIGH (ref 0.50–1.35)
GFR calc Af Amer: 22 mL/min — ABNORMAL LOW (ref >90)
GFR calc non Af Amer: 19 mL/min — ABNORMAL LOW (ref >90)
Glucose, Bld: 112 mg/dL — ABNORMAL HIGH (ref 70–99)
Potassium: 3.3 mEq/L — ABNORMAL LOW (ref 3.5–5.1)
Sodium: 141 mEq/L (ref 135–145)

## 2013-08-02 LAB — LACTIC ACID, PLASMA: Lactic Acid, Venous: 1.6 mmol/L (ref 0.5–2.2)

## 2013-08-02 LAB — ABO/RH: ABO/RH(D): O POS

## 2013-08-02 LAB — GLUCOSE, CAPILLARY: Glucose-Capillary: 161 mg/dL — ABNORMAL HIGH (ref 70–99)

## 2013-08-02 LAB — FERRITIN: Ferritin: 273 ng/mL (ref 22–322)

## 2013-08-02 MED ORDER — SODIUM CHLORIDE 0.9 % IV BOLUS (SEPSIS)
500.0000 mL | Freq: Once | INTRAVENOUS | Status: AC
  Administered 2013-08-03: 500 mL via INTRAVENOUS

## 2013-08-02 MED ORDER — SODIUM CHLORIDE 0.9 % IV BOLUS (SEPSIS)
500.0000 mL | Freq: Once | INTRAVENOUS | Status: AC
  Administered 2013-08-02: 500 mL via INTRAVENOUS

## 2013-08-02 MED ORDER — POTASSIUM CHLORIDE CRYS ER 20 MEQ PO TBCR
40.0000 meq | EXTENDED_RELEASE_TABLET | Freq: Once | ORAL | Status: AC
  Administered 2013-08-03: 40 meq via ORAL
  Filled 2013-08-02: qty 2

## 2013-08-02 NOTE — ED Provider Notes (Signed)
CSN: [id number]     Arrival date & time 08/02/13  2038 History   First MD Initiated Contact with Patient 08/02/13 2040     Chief Complaint  Patient presents with  . Loss of Consciousness   (Consider location/radiation/quality/duration/timing/severity/associated sxs/prior Treatment) HPI Comments: Patient is a 27 year old male with a past medical history of anemia, nephrotic syndrome, end-stage renal disease on dialysis and hypertension who presents to the emergency department via EMS after having a syncopal episode. Patient was with his cousin at the mall, on an escalator when he fell forward and landed on his cousin, then hit the right side of his head during the syncopal episode. He awoke shortly after and was alert and oriented per cousin. Patient had dialysis around 12:00 noon today, after dialysis felt lightheaded and dizzy. Currently he is feeling lightheaded, dizzy, weak and fatigued. States for the past few days he has had subjective fevers on and off. He was admitted to the hospital on October 17 for renal failure. Denies abdominal pain, nausea, vomiting, chest pain, shortness of breath, confusion. Upon EMS arrival, CBG 117. He had positive orthostatic vital signs.  Patient is a 27 y.o. male presenting with syncope. The history is provided by the patient and a relative.  Loss of Consciousness Associated symptoms: dizziness and weakness     Past Medical History  Diagnosis Date  . Anemia   . Nephrotic syndrome   . Hypertension 08/09/2011   Past Surgical History  Procedure Laterality Date  . Renal biopsy    . Av fistula placement Left 07/18/2013    Procedure: ARTERIOVENOUS (AV) FISTULA CREATION ;  Surgeon: Rosetta Posner, MD;  Location: [facility];  Service: Vascular;  Laterality: Left;  . Insertion of dialysis catheter Right 07/18/2013    Procedure: INSERTION OF DIALYSIS CATHETER;  Surgeon: Rosetta Posner, MD;  Location: [facility] OR;  Service: Vascular;  Laterality: Right;   Family History   Problem Relation Age of Onset  . Hypertension Mother    History  Substance Use Topics  . Smoking status: Passive Smoke Exposure - Never Smoker    Types: Cigars  . Smokeless tobacco: Never Used  . Alcohol Use: 0.6 oz/week    1 Shots of liquor per week    Review of Systems  Constitutional: Positive for fatigue.  Cardiovascular: Positive for syncope.  Neurological: Positive for dizziness, syncope, weakness and light-headedness.  All other systems reviewed and are negative.    Allergies  Review of patient's allergies indicates no known allergies.  Home Medications   Current Outpatient Rx  Name  Route  Sig  Dispense  Refill  . calcium acetate (PHOSLO) 667 MG capsule   Oral   Take 2 capsules (1,334 mg total) by mouth 3 (three) times daily with meals.   90 capsule   0   . carvedilol (COREG) 3.125 MG tablet   Oral   Take 1 tablet (3.125 mg total) by mouth 2 (two) times daily with a meal.   30 tablet   0   . ciprofloxacin (CIPRO) 500 MG tablet   Oral   Take 1 tablet (500 mg total) by mouth daily with breakfast.   16 tablet   0   . Multiple Vitamin (MULTIVITAMIN WITH MINERALS) TABS tablet   Oral   Take 1 tablet by mouth daily.         . metroNIDAZOLE (FLAGYL) 500 MG tablet   Oral   Take 1 tablet (500 mg total) by mouth every 8 (eight) hours.  21 tablet   0    BP 109/50  Pulse 114  Temp(Src) 98.5 F (36.9 C) (Rectal)  Resp 18  SpO2 100% Physical Exam  Nursing note and vitals reviewed. Constitutional: He is oriented to person, place, and time. He appears well-developed and well-nourished. No distress.  HENT:  Head: Normocephalic and atraumatic.  Mouth/Throat: Oropharynx is clear and moist.  1 inch superficial laceration to right eyebrow, no active bleeding.  Eyes: Conjunctivae and EOM are normal. Pupils are equal, round, and reactive to light.  Neck: Normal range of motion. Neck supple.  Cardiovascular: Regular rhythm, normal heart sounds and intact  distal pulses.  Tachycardia present.   Pulmonary/Chest: Effort normal and breath sounds normal.  Abdominal: Soft. Bowel sounds are normal. He exhibits no distension. There is no tenderness.  Musculoskeletal: Normal range of motion. He exhibits no edema.  Neurological: He is alert and oriented to person, place, and time. He has normal strength. No cranial nerve deficit or sensory deficit. Coordination normal. GCS eye subscore is 4. GCS verbal subscore is 5. GCS motor subscore is 6.  Skin: Skin is warm and dry. He is not diaphoretic.  Psychiatric: He has a normal mood and affect. His behavior is normal.    ED Course  Procedures (including critical care time) Labs Review Labs Reviewed  CBC - Abnormal; Notable for the following:    RBC 3.23 (*)    Hemoglobin 9.4 (*)    HCT 30.1 (*)    All other components within normal limits  BASIC METABOLIC PANEL - Abnormal; Notable for the following:    Potassium 3.3 (*)    CO2 33 (*)    Glucose, Bld 112 (*)    Creatinine, Ser 4.12 (*)    GFR calc non Af Amer 19 (*)    GFR calc Af Amer 22 (*)    All other components within normal limits  GLUCOSE, CAPILLARY - Abnormal; Notable for the following:    Glucose-Capillary 161 (*)    All other components within normal limits  CULTURE, BLOOD (ROUTINE X 2)  CULTURE, BLOOD (ROUTINE X 2)  LACTIC ACID, PLASMA  URINALYSIS, ROUTINE W REFLEX MICROSCOPIC  TYPE AND SCREEN  ABO/RH   Imaging Review No results found.  EKG Interpretation     Ventricular Rate:  101 PR Interval:  127 QRS Duration: 110 QT Interval:  417 QTC Calculation: 541 R Axis:   82 Text Interpretation:  Sinus tachycardia Probable left atrial enlargement Left ventricular hypertrophy ST elev, probable normal early repol pattern Prolonged QT interval            MDM   1. Hypotension   2. Syncope   3. Prolonged QT interval   4. Hypokalemia     Patient presenting after a syncopal episode after dialysis. He is well appearing and  in no apparent distress. Tachycardic, hypotensive around 114/46 on my exam. Positive orthostatic vital signs with EMS. Afebrile, feels warm. Will check rectal temp. Labs pending- CBC, BMP, lactic acid, blood culture, urinalysis, urine culture. Will give 500 cc fluid bolus. Recheck orthostats. 11:44 PM Patient not septic. No leukocytosis, normal lactic acid. BP began to improve after 500cc bolus, dropped again to 70s. Will give another 500cc bolus. Not orthostatic. Potassium replaced PO. EKG shows prolonged QT. CXR clear. He remains well appearing and in NAD. He will be admitted for syncope/hypotension. Admission accepted by Dr. Posey Pronto, [facility]. Plan to control BP prior to deciding floor type as there is currently no step-down and limited  space in ICU.  11:53 PM Step-down bed available. Will admit to step-down unit.  Illene Labrador, PA-C 08/02/13 2353

## 2013-08-02 NOTE — ED Notes (Signed)
Pt aware of needed void

## 2013-08-02 NOTE — ED Notes (Addendum)
Per EMS: Pt had dialysis today at 1230, was at mall and had witnessed syncopal episode. Small laceration to right eyebrow, denies neck/back pain. AO x4. CBG 117. VSS. Pt orthostatic.

## 2013-08-02 NOTE — ED Notes (Signed)
Patient able to tell RN his name, but unable to state his date of birth and when asked if he knew where he was, pt stated, "[place] the [place]." Patient follows commands appropriately.

## 2013-08-03 DIAGNOSIS — I953 Hypotension of hemodialysis: Principal | ICD-10-CM | POA: Diagnosis present

## 2013-08-03 DIAGNOSIS — N289 Disorder of kidney and ureter, unspecified: Secondary | ICD-10-CM

## 2013-08-03 DIAGNOSIS — D631 Anemia in chronic kidney disease: Secondary | ICD-10-CM

## 2013-08-03 DIAGNOSIS — R55 Syncope and collapse: Secondary | ICD-10-CM

## 2013-08-03 DIAGNOSIS — I959 Hypotension, unspecified: Secondary | ICD-10-CM

## 2013-08-03 DIAGNOSIS — N184 Chronic kidney disease, stage 4 (severe): Secondary | ICD-10-CM

## 2013-08-03 DIAGNOSIS — N189 Chronic kidney disease, unspecified: Secondary | ICD-10-CM

## 2013-08-03 DIAGNOSIS — N186 End stage renal disease: Secondary | ICD-10-CM

## 2013-08-03 HISTORY — DX: Syncope and collapse: R55

## 2013-08-03 LAB — CBC
HCT: 25.1 % — ABNORMAL LOW (ref 39.0–52.0)
Hemoglobin: 8.1 g/dL — ABNORMAL LOW (ref 13.0–17.0)
MCH: 30.1 pg (ref 26.0–34.0)
MCHC: 32.3 g/dL (ref 30.0–36.0)
MCV: 93.3 fL (ref 78.0–100.0)
Platelets: 176 10*3/uL (ref 150–400)
RBC: 2.69 MIL/uL — ABNORMAL LOW (ref 4.22–5.81)
RDW: 14.3 % (ref 11.5–15.5)
WBC: 7 10*3/uL (ref 4.0–10.5)

## 2013-08-03 LAB — COMPREHENSIVE METABOLIC PANEL
ALT: 8 U/L (ref 0–53)
AST: 36 U/L (ref 0–37)
Albumin: 3.2 g/dL — ABNORMAL LOW (ref 3.5–5.2)
Alkaline Phosphatase: 46 U/L (ref 39–117)
BUN: 21 mg/dL (ref 6–23)
CO2: 30 mEq/L (ref 19–32)
Calcium: 8.4 mg/dL (ref 8.4–10.5)
Chloride: 103 mEq/L (ref 96–112)
Creatinine, Ser: 5.46 mg/dL — ABNORMAL HIGH (ref 0.50–1.35)
GFR calc Af Amer: 15 mL/min — ABNORMAL LOW (ref >90)
GFR calc non Af Amer: 13 mL/min — ABNORMAL LOW (ref >90)
Glucose, Bld: 92 mg/dL (ref 70–99)
Potassium: 3.9 mEq/L (ref 3.5–5.1)
Sodium: 143 mEq/L (ref 135–145)
Total Bilirubin: 0.3 mg/dL (ref 0.3–1.2)
Total Protein: 6.5 g/dL (ref 6.0–8.3)

## 2013-08-03 LAB — MRSA PCR SCREENING: MRSA by PCR: NEGATIVE

## 2013-08-03 LAB — PROTIME-INR
INR: 1.1 (ref 0.00–1.49)
Prothrombin Time: 14 seconds (ref 11.6–15.2)

## 2013-08-03 MED ORDER — CALCIUM ACETATE 667 MG PO CAPS
1334.0000 mg | ORAL_CAPSULE | Freq: Three times a day (TID) | ORAL | Status: DC
  Filled 2013-08-03 (×3): qty 2

## 2013-08-03 MED ORDER — SODIUM CHLORIDE 0.9 % IJ SOLN: 3.0000 mL | Freq: Two times a day (BID) | INTRAMUSCULAR | Status: DC

## 2013-08-03 MED ORDER — HEPARIN SODIUM (PORCINE) 5000 UNIT/ML IJ SOLN
5000.0000 [IU] | Freq: Three times a day (TID) | INTRAMUSCULAR | Status: DC
  Filled 2013-08-03 (×4): qty 1

## 2013-08-03 MED ORDER — SODIUM CHLORIDE 0.9 % IV SOLN: INTRAVENOUS | Status: DC

## 2013-08-03 MED ORDER — ONDANSETRON HCL 4 MG/2ML IJ SOLN: 4.0000 mg | Freq: Four times a day (QID) | INTRAMUSCULAR | Status: DC | PRN

## 2013-08-03 MED ORDER — ONDANSETRON HCL 4 MG PO TABS: 4.0000 mg | ORAL_TABLET | Freq: Four times a day (QID) | ORAL | Status: DC | PRN

## 2013-08-03 NOTE — Discharge Summary (Signed)
Physician Discharge Summary  Christopher Wang [id number] DOB: Jul 16, 1986 DOA: 08/02/2013  PCP: Lucrezia Starch, MD  Admit date: 08/02/2013 Discharge date: 08/03/2013  Time spent: 35 minutes  Recommendations for Outpatient Follow-up:  1. Please follow up on patient's blood pressures.   Discharge Diagnoses:  Principal Problem:   Hypotension of hemodialysis Active Problems:   End stage renal disease   Syncope   Discharge Condition: Stable/improved  Diet recommendation: Renal Diet  Filed Weights   08/03/13 0552  Weight: 73.1 kg (161 lb 2.5 oz)    History of present illness:  Christopher Wang is a 27 y.o. male with Past medical history of ESRD secondary to necrotic syndrome FSGS on hemodialysis, hypertension.  The patient presented today with the complaint of fall after a syncopal episode. He went to his routine session of dialysis and off for the dialysis when he was on the escalator he felt lightheaded and lost consciousness he hit the right side of his head and denies any other trauma.  Prior to his hemodialysis he denies any complaint of fever, chills, chest pain, nausea, vomiting, dizziness, lightheadedness, focal neurological deficit.  He denies any leg swelling or rash.  He denies any recent change in his medications, he does mention that he is compliant with his medications.  Hospital Course:  Patient been the and lightheadedness 1 or 2 hours after undergoing hemodialysis on 08/02/2013. He [place] the local mall, having a syncopal episode on the escalator. He denied chest pain, shortness of breath, focal deficits or seizure activity prior to event. Patient was hypotensive on presentation, having a blood pressure 70/42. This responded to IV fluid resuscitation, patient receiving 2 L of normal saline in the emergency department. He was placed in overnight observation, with blood pressures remaining stable overnight. Hypotension in setting of hypovolemia attributed to  hemodialysis. Lab work remains stable, with white count of 7000, patient remaining afebrile, nontoxic. Discharge stable condition on 08/03/2013.  Discharge Exam: Filed Vitals:   08/03/13 0800  BP: 105/64  Pulse: 85  Temp: 98.8 F (37.1 C)  Resp: 13    General: No acute distress awake alert oriented Cardiovascular:  regular rate rhythm normal S1-S2 Respiratory:  lungs are clear to auscultation bilaterally Abdomen: Soft nontender nondistended positive bowel sounds Extremities: No edema  Discharge Instructions  Discharge Orders   Future Appointments Provider Department Dept Phone   09/06/2013 11:00 AM [facility] [place] [phone]   09/06/2013 12:15 PM Rosetta Posner, MD [facility] [phone]   Future Orders Complete By Expires   Diet - low sodium heart healthy  As directed    Discharge instructions  As directed    Comments:     Please follow up with your family doctor in 1 week   Increase activity slowly  As directed        Medication List    STOP taking these medications       carvedilol 3.125 MG tablet  Commonly known as:  COREG     ciprofloxacin 500 MG tablet  Commonly known as:  CIPRO     metroNIDAZOLE 500 MG tablet  Commonly known as:  FLAGYL      TAKE these medications       calcium acetate 667 MG capsule  Commonly known as:  PHOSLO  Take 2 capsules (1,334 mg total) by mouth 3 (three) times daily with meals.     multivitamin with minerals Tabs tablet  Take 1 tablet  by mouth daily.       No Known Allergies     Follow-up Information   Follow up with DUNHAM,CYNTHIA B, MD In 1 week.   Specialty:  Nephrology   Contact information:   [place] [phone]        The results of significant diagnostics from this hospitalization (including imaging, microbiology, ancillary and laboratory) are listed below for reference.     Significant Diagnostic Studies: Ct Abdomen Pelvis Wo Contrast  07/15/2013   CLINICAL DATA:  Abdominal pain.  EXAM: CT ABDOMEN AND PELVIS WITHOUT CONTRAST  TECHNIQUE: Multidetector CT imaging of the abdomen and pelvis was performed following the standard protocol without intravenous contrast.  COMPARISON:  None.  FINDINGS: Visualized lung bases appear normal. No gallstones are noted. The liver, spleen and pancreas appeared appear normal. However, there is noted fluid around the spleen. The appendix appears normal. There is noted significant wall and fold thickening involving proximal small bowel consistent with enteritis or other inflammation. Free fluid is also noted in the dependent portion of the pelvis. Urinary bladder appears normal. Adrenal glands appear normal. Mild bilateral renal atrophy is noted.  IMPRESSION: Mild bilateral renal atrophy. Severe wall and fold thickening of proximal small bowel is noted most consistent with inflammation or enteritis ; however infiltrative disease such as lymphoma cannot be excluded and clinical correlation is recommend. Small amount of free fluid is noted around the spleen as well as in the dependent portion of the pelvis.   Electronically Signed   By: Sabino Dick M.D.   On: 07/15/2013 10:11   Dg Chest 2 View  07/15/2013   CLINICAL DATA:  Shortness of breath  EXAM: CHEST  2 VIEW  COMPARISON:  Prior radiograph from 02/23/2012  FINDINGS: Cardiac and mediastinal silhouettes are stable in size and contour, and remain within normal limits.  Lungs are normally inflated without airspace consolidation, pleural effusion, or pulmonary edema. There is no pneumothorax.  Osseous structures are within normal limits.  IMPRESSION: No active cardiopulmonary disease.   Electronically Signed   By: Jeannine Boga M.D.   On: 07/15/2013 06:39   Dg Chest Port 1 View  07/18/2013   CLINICAL DATA:  Catheter placement  EXAM: PORTABLE CHEST - 1 VIEW  COMPARISON:  July 15, 2013   FINDINGS: Central catheter positioned with tips in right atrium. No pneumothorax. Lungs are clear. Heart size is upper normal with normal pulmonary vascularity. No adenopathy. There is some thoracic levoscoliosis.  IMPRESSION: Catheter tips in right atrium. No pneumothorax. No edema or consolidation.   Electronically Signed   By: Lowella Grip M.D.   On: 07/18/2013 10:29   Dg Fluoro Guide Cv Line-no Report  07/18/2013   CLINICAL DATA: Diatek catheter   FLOURO GUIDE CV LINE  Fluoroscopy was utilized by the requesting physician.  No radiographic  interpretation.     Microbiology: Recent Results (from the past 240 hour(s))  MRSA PCR SCREENING     Status: None   Collection Time    08/03/13  6:01 AM      Result Value Range Status   MRSA by PCR NEGATIVE  NEGATIVE Final   Comment:            The GeneXpert MRSA Assay (FDA     approved for NASAL specimens     only), is one component of a     comprehensive MRSA colonization     surveillance program. It is not  intended to diagnose MRSA     infection nor to guide or     monitor treatment for     MRSA infections.     Labs: Basic Metabolic Panel:  Recent Labs Lab 08/02/13 2103 08/03/13 0810  NA 141 143  K 3.3* 3.9  CL 97 103  CO2 33* 30  GLUCOSE 112* 92  BUN 16 21  CREATININE 4.12* 5.46*  CALCIUM 8.9 8.4   Liver Function Tests:  Recent Labs Lab 08/03/13 0810  AST 36  ALT 8  ALKPHOS 46  BILITOT 0.3  PROT 6.5  ALBUMIN 3.2*   No results found for this basename: LIPASE, AMYLASE,  in the last 168 hours No results found for this basename: AMMONIA,  in the last 168 hours CBC:  Recent Labs Lab 08/02/13 2103 08/03/13 0810  WBC 9.7 7.0  HGB 9.4* 8.1*  HCT 30.1* 25.1*  MCV 93.2 93.3  PLT 203 176   Cardiac Enzymes: No results found for this basename: CKTOTAL, CKMB, CKMBINDEX, TROPONINI,  in the last 168 hours BNP: BNP (last 3 results) No results found for this basename: PROBNP,  in the last 8760  hours CBG:  Recent Labs Lab 08/02/13 2047  GLUCAP 161*       Signed:  Kimarion Chery  [facility] 08/03/2013, 11:29 AM

## 2013-08-03 NOTE — Progress Notes (Signed)
Pt d/c home per MD order, pt instructions given, all questions answered, pt walked 347ft prior to d/c, pt tol well,

## 2013-08-03 NOTE — Progress Notes (Signed)
Christopher Wang was admitted to the medicine service on 08/02/13 discharged on 08/03/13 treated for a medical condition. He may return to work on 08/04/13. He is a dialysis patient for which he requires dialysis on Tues, Thurs and Sat of every week.   Thank you    Milinda Cave MD

## 2013-08-03 NOTE — H&P (Signed)
[facility] History and Physical  Patient: Christopher Wang  [id number]  DOB: 1985/11/13  DOA: 08/02/2013  Referring physician: Illene Labrador, PA-C PCP: Lucrezia Starch, MD  Consults:     Chief Complaint: Syncope  HPI: Christopher Wang is a 27 y.o. male with Past medical history of ESRD secondary to necrotic syndrome FSGS on hemodialysis, hypertension. The patient presented today with the complaint of fall after a syncopal episode. He went to his routine session of dialysis and off for the dialysis when he was on the escalator he felt lightheaded and lost consciousness he hit the right side of his head and denies any other trauma. Prior to his hemodialysis he denies any complaint of fever, chills, chest pain, nausea, vomiting, dizziness, lightheadedness, focal neurological deficit. He denies any leg swelling or rash. He denies any recent change in his medications, he does mention that he is compliant with his medications.  Review of Systems: as mentioned in the history of present illness.  A Comprehensive review of the other systems is negative.  Past Medical History  Diagnosis Date  . Anemia   . Nephrotic syndrome   . Hypertension 08/09/2011   Past Surgical History  Procedure Laterality Date  . Renal biopsy    . Av fistula placement Left 07/18/2013    Procedure: ARTERIOVENOUS (AV) FISTULA CREATION ;  Surgeon: Rosetta Posner, MD;  Location: [place];  Service: Vascular;  Laterality: Left;  . Insertion of dialysis catheter Right 07/18/2013    Procedure: INSERTION OF DIALYSIS CATHETER;  Surgeon: Rosetta Posner, MD;  Location: [place];  Service: Vascular;  Laterality: Right;   Social History:  reports that he has been passively smoking Cigars.  He has never used smokeless tobacco. He reports that he drinks about 0.6 ounces of alcohol per week. He reports that he does not use illicit drugs. Patient is coming from home. Independent for most of his  ADL.  No Known  Allergies  Family History  Problem Relation Age of Onset  . Hypertension Mother     Prior to Admission medications   Medication Sig Start Date End Date Taking? Authorizing Provider  calcium acetate (PHOSLO) 667 MG capsule Take 2 capsules (1,334 mg total) by mouth 3 (three) times daily with meals. 07/22/13  Yes Maryann Mikhail, DO  carvedilol (COREG) 3.125 MG tablet Take 1 tablet (3.125 mg total) by mouth 2 (two) times daily with a meal. 07/22/13  Yes Maryann Mikhail, DO  ciprofloxacin (CIPRO) 500 MG tablet Take 1 tablet (500 mg total) by mouth daily with breakfast. 07/22/13  Yes Maryann Mikhail, DO  Multiple Vitamin (MULTIVITAMIN WITH MINERALS) TABS tablet Take 1 tablet by mouth daily.   Yes Historical Provider, MD  metroNIDAZOLE (FLAGYL) 500 MG tablet Take 1 tablet (500 mg total) by mouth every 8 (eight) hours. 07/22/13   Cristal Ford, DO    Physical Exam: Filed Vitals:   08/03/13 0215 08/03/13 0300 08/03/13 0400 08/03/13 0500  BP: 103/50 82/62 109/53 102/51  Pulse: 92 98 85 90  Temp:      TempSrc:      Resp: 14 16 18 15   SpO2: 98% 100% 99% 99%    General: Alert, Awake and Oriented to Time, Place and Person. Appear in mild distress Eyes: PERRL ENT: Oral Mucosa clear moist. Neck: no JVD, no Carotid Bruits  Cardiovascular: S1 and S2 Present, no Murmur, Peripheral Pulses Present Respiratory: Bilateral Air entry equal and Decreased, Clear to Auscultation,  no Crackles,no wheezes Abdomen: Bowel  Sound Present, Soft and Non tender Skin: no Rash Extremities: no Pedal edema, no calf tenderness Neurologic: Grossly Unremarkable.  Labs on Admission:  CBC:  Recent Labs Lab 08/02/13 2103  WBC 9.7  HGB 9.4*  HCT 30.1*  MCV 93.2  PLT 203    CMP     Component Value Date/Time   NA 141 08/02/2013 2103   K 3.3* 08/02/2013 2103   CL 97 08/02/2013 2103   CO2 33* 08/02/2013 2103   GLUCOSE 112* 08/02/2013 2103   BUN 16 08/02/2013 2103   CREATININE 4.12* 08/02/2013 2103   CALCIUM  8.9 08/02/2013 2103   PROT 8.2 07/15/2013 0610   ALBUMIN 2.9* 07/21/2013 0515   AST 14 07/15/2013 0610   ALT 9 07/15/2013 0610   ALKPHOS 67 07/15/2013 0610   BILITOT 0.4 07/15/2013 0610   GFRNONAA 19* 08/02/2013 2103   GFRAA 22* 08/02/2013 2103    No results found for this basename: LIPASE, AMYLASE,  in the last 168 hours No results found for this basename: AMMONIA,  in the last 168 hours  Cardiac Enzymes: No results found for this basename: CKTOTAL, CKMB, CKMBINDEX, TROPONINI,  in the last 168 hours  BNP (last 3 results) No results found for this basename: PROBNP,  in the last 8760 hours  Radiological Exams on Admission: No results found.  EKG: Independently reviewed. normal sinus rhythm.  Assessment/Plan Principal Problem:   Hypotension of hemodialysis Active Problems:   End stage renal disease   Syncope   1. Hypotension of hemodialysis The patient is presenting with fall and loss of consciousness after hemodialysis. He has been hypertensive but his blood pressure does respond to IV fluids. Therefore we will gently hydrate the patient considering his history of hemodialysis. Blood culture has been sent as well as chest x-rays been obtained which is negative. Urinalysis is pending. Patient does not have any focal neurological deficit. We will monitor him on telemetry. Holding his Coreg at present.  2. ESRD Patient has completed his hemodialysis today, gentle hydration considering his history of ESRD. Monitor is and os.  3. Syncope Since patient has definite hypovolemia after hemodialysis, no further workup required. He still remained monitor on telemetry. Marland Kitchen  DVT Prophylaxis: subcutaneous Heparin Nutrition:  Renal diet  Code Status:  Full  Family Communication:  Friend was present at bedside, opportunity was given to the family to ask question and all questions were answered satisfactorily at the time of interview. Disposition: Admitted to inpatient in step-down  unit.  Author: Berle Mull, MD [facility] Pager: [phone] 08/03/2013, 6:21 AM    If 7PM-7AM, please contact night-coverage [email or web address] Password [email or web address]

## 2013-08-09 LAB — CULTURE, BLOOD (ROUTINE X 2)
Culture: NO GROWTH
Culture: NO GROWTH

## 2013-08-09 NOTE — ED Provider Notes (Signed)
Medical screening examination/treatment/procedure(s) were performed by non-physician practitioner and as supervising physician I was immediately available for consultation/collaboration.  EKG Interpretation   None        Varney Biles, MD 08/09/13 [phone]

## 2013-08-12 ENCOUNTER — Other Ambulatory Visit: Payer: Self-pay | Admitting: *Deleted

## 2013-08-12 DIAGNOSIS — Z4931 Encounter for adequacy testing for hemodialysis: Secondary | ICD-10-CM

## 2013-08-12 DIAGNOSIS — N186 End stage renal disease: Secondary | ICD-10-CM

## 2013-09-05 ENCOUNTER — Encounter: Payer: Self-pay | Admitting: Vascular Surgery

## 2013-09-06 ENCOUNTER — Other Ambulatory Visit (HOSPITAL_COMMUNITY): Payer: Self-pay

## 2013-09-06 ENCOUNTER — Encounter: Payer: Self-pay | Admitting: Vascular Surgery

## 2013-09-12 ENCOUNTER — Encounter: Payer: Self-pay | Admitting: Vascular Surgery

## 2013-09-13 ENCOUNTER — Other Ambulatory Visit (HOSPITAL_COMMUNITY): Payer: Self-pay

## 2013-09-13 ENCOUNTER — Encounter: Payer: Self-pay | Admitting: Vascular Surgery

## 2013-09-24 ENCOUNTER — Encounter (HOSPITAL_COMMUNITY): Payer: Self-pay | Admitting: Emergency Medicine

## 2013-09-24 ENCOUNTER — Emergency Department (HOSPITAL_COMMUNITY)
Admission: EM | Admit: 2013-09-24 | Discharge: 2013-09-24 | Disposition: A | Discharge status: Home / Self Care | Payer: Medicaid Other | Attending: Emergency Medicine | Admitting: Emergency Medicine

## 2013-09-24 DIAGNOSIS — N186 End stage renal disease: Secondary | ICD-10-CM | POA: Insufficient documentation

## 2013-09-24 DIAGNOSIS — Z79899 Other long term (current) drug therapy: Secondary | ICD-10-CM | POA: Insufficient documentation

## 2013-09-24 DIAGNOSIS — Y841 Kidney dialysis as the cause of abnormal reaction of the patient, or of later complication, without mention of misadventure at the time of the procedure: Secondary | ICD-10-CM | POA: Insufficient documentation

## 2013-09-24 DIAGNOSIS — T82598A Other mechanical complication of other cardiac and vascular devices and implants, initial encounter: Secondary | ICD-10-CM | POA: Insufficient documentation

## 2013-09-24 DIAGNOSIS — IMO0002 Reserved for concepts with insufficient information to code with codable children: Secondary | ICD-10-CM

## 2013-09-24 DIAGNOSIS — Z862 Personal history of diseases of the blood and blood-forming organs and certain disorders involving the immune mechanism: Secondary | ICD-10-CM | POA: Insufficient documentation

## 2013-09-24 DIAGNOSIS — I120 Hypertensive chronic kidney disease with stage 5 chronic kidney disease or end stage renal disease: Secondary | ICD-10-CM | POA: Insufficient documentation

## 2013-09-24 MED ORDER — SODIUM CHLORIDE 0.9 % IJ SOLN
10.0000 mL | INTRAMUSCULAR | Status: DC | PRN
  Administered 2013-09-24: 10 mL

## 2013-09-24 NOTE — ED Provider Notes (Signed)
CSN: [id number]     Arrival date & time 09/24/13  1358 History   First MD Initiated Contact with Patient 09/24/13 1846     Chief Complaint  Patient presents with  . dialysis catheter repair     The history is provided by the patient.   Patient is here for dialysis catheter repair He reports that one port on his dialysis catheter is broken He was able to receive full dialysis today but was sent for further evaluation Denies cp/sob.  No fever or vomiting.  He has no other complaints at this time His course is stable Nothing worsens his symptoms  Past Medical History  Diagnosis Date  . Anemia   . Nephrotic syndrome   . Hypertension 08/09/2011   Past Surgical History  Procedure Laterality Date  . Renal biopsy    . Av fistula placement Left 07/18/2013    Procedure: ARTERIOVENOUS (AV) FISTULA CREATION ;  Surgeon: Rosetta Posner, MD;  Location: [place] [facility];  Service: Vascular;  Laterality: Left;  . Insertion of dialysis catheter Right 07/18/2013    Procedure: INSERTION OF DIALYSIS CATHETER;  Surgeon: Rosetta Posner, MD;  Location: [facility];  Service: Vascular;  Laterality: Right;   Family History  Problem Relation Age of Onset  . Hypertension Mother    History  Substance Use Topics  . Smoking status: Passive Smoke Exposure - Never Smoker    Types: Cigars  . Smokeless tobacco: Never Used  . Alcohol Use: 0.6 oz/week    1 Shots of liquor per week    Review of Systems  Constitutional: Negative for fever.  Respiratory: Negative for shortness of breath.   Cardiovascular: Negative for chest pain.  Neurological: Negative for weakness.  All other systems reviewed and are negative.    Allergies  Review of patient's allergies indicates no known allergies.  Home Medications   Current Outpatient Rx  Name  Route  Sig  Dispense  Refill  . Multiple Vitamin (MULTIVITAMIN WITH MINERALS) TABS tablet   Oral   Take 1 tablet by mouth daily.          BP 122/81  Pulse 101  Temp(Src) 99.1 F  (37.3 C) (Oral)  Resp 16  Ht 5\' 9"  (1.753 m)  Wt 159 lb 14.4 oz (72.53 kg)  BMI 23.60 kg/m2  SpO2 100% Physical Exam CONSTITUTIONAL: Well developed/well nourished HEAD: Normocephalic/atraumatic EYES: EOMI ENMT: Mucous membranes moist NECK: supple no meningeal signs Chest - catheter in right chest, no active drainage noted LUNGS:  no apparent distress ABDOMEN: soft, nontender, no rebound or guarding NEURO: Pt is awake/alert, moves all extremitiesx4 EXTREMITIES:  full ROM SKIN: warm, color normal PSYCH: no abnormalities of mood noted  ED Course  Procedures (including critical care time)  8:36 PM Pt stable D/w on call nephrology.  He asks me to call vascular to arrange replacement ,8:47 PM D/w dr Scot Dock, he can arrange for replacement tomorrow morning  I spoke to nephrology and made them aware of plan Pt stable for d/c home Labs Review Labs Reviewed - No data to display Imaging Review No results found.  EKG Interpretation   None       MDM  No diagnosis found. Nursing notes including past medical history and social history reviewed and considered in documentation     Sharyon Cable, MD 09/24/13 2114

## 2013-09-24 NOTE — ED Notes (Signed)
Pt given sandwich and apple juice  

## 2013-09-24 NOTE — ED Notes (Signed)
Pt left after receiving discharge information verbally from Dr Christy Gentles, but prior to receiving paper discharge and signed from RN.

## 2013-09-24 NOTE — ED Notes (Signed)
Pt provided meal and apple juice per Dr Christy Gentles approval

## 2013-09-24 NOTE — ED Notes (Signed)
Per pt sent here from dialysis center to repair dialysis catheter. There is a whole in the blue tip and was leaking at the beginning of dialysis.

## 2013-09-24 NOTE — ED Notes (Signed)
Pt dialysis cath flushed by IV team.  Cath leaking on the distal end of the blue cap.  MD made aware.

## 2013-09-25 ENCOUNTER — Ambulatory Visit: Admit: 2013-09-25 | Payer: Self-pay | Admitting: Vascular Surgery

## 2013-09-25 ENCOUNTER — Encounter (HOSPITAL_COMMUNITY): Payer: Self-pay | Admitting: Certified Registered"

## 2013-09-25 SURGERY — INSERTION OF DIALYSIS CATHETER
Anesthesia: General

## 2013-10-01 ENCOUNTER — Emergency Department (HOSPITAL_COMMUNITY): Payer: Medicaid Other

## 2013-10-01 ENCOUNTER — Emergency Department (HOSPITAL_COMMUNITY)
Admission: EM | Admit: 2013-10-01 | Discharge: 2013-10-01 | Disposition: A | Discharge status: Home / Self Care | Payer: Medicaid Other | Attending: Emergency Medicine | Admitting: Emergency Medicine

## 2013-10-01 ENCOUNTER — Encounter (HOSPITAL_COMMUNITY): Payer: Self-pay | Admitting: Emergency Medicine

## 2013-10-01 DIAGNOSIS — R51 Headache: Secondary | ICD-10-CM | POA: Insufficient documentation

## 2013-10-01 DIAGNOSIS — N2581 Secondary hyperparathyroidism of renal origin: Secondary | ICD-10-CM | POA: Diagnosis not present

## 2013-10-01 DIAGNOSIS — Z992 Dependence on renal dialysis: Secondary | ICD-10-CM | POA: Insufficient documentation

## 2013-10-01 DIAGNOSIS — IMO0001 Reserved for inherently not codable concepts without codable children: Secondary | ICD-10-CM

## 2013-10-01 DIAGNOSIS — N186 End stage renal disease: Secondary | ICD-10-CM | POA: Insufficient documentation

## 2013-10-01 DIAGNOSIS — D631 Anemia in chronic kidney disease: Secondary | ICD-10-CM | POA: Diagnosis not present

## 2013-10-01 DIAGNOSIS — Z79899 Other long term (current) drug therapy: Secondary | ICD-10-CM | POA: Insufficient documentation

## 2013-10-01 DIAGNOSIS — I120 Hypertensive chronic kidney disease with stage 5 chronic kidney disease or end stage renal disease: Secondary | ICD-10-CM | POA: Insufficient documentation

## 2013-10-01 DIAGNOSIS — R030 Elevated blood-pressure reading, without diagnosis of hypertension: Secondary | ICD-10-CM

## 2013-10-01 DIAGNOSIS — Z862 Personal history of diseases of the blood and blood-forming organs and certain disorders involving the immune mechanism: Secondary | ICD-10-CM | POA: Insufficient documentation

## 2013-10-01 DIAGNOSIS — R519 Headache, unspecified: Secondary | ICD-10-CM

## 2013-10-01 DIAGNOSIS — D509 Iron deficiency anemia, unspecified: Secondary | ICD-10-CM | POA: Diagnosis not present

## 2013-10-01 DIAGNOSIS — Z23 Encounter for immunization: Secondary | ICD-10-CM | POA: Diagnosis not present

## 2013-10-01 DIAGNOSIS — N039 Chronic nephritic syndrome with unspecified morphologic changes: Secondary | ICD-10-CM | POA: Diagnosis not present

## 2013-10-01 LAB — CBC WITH DIFFERENTIAL/PLATELET
Basophils Absolute: 0 10*3/uL (ref 0.0–0.1)
Basophils Relative: 0 % (ref 0–1)
Eosinophils Absolute: 0 10*3/uL (ref 0.0–0.7)
Eosinophils Relative: 0 % (ref 0–5)
HCT: 44.3 % (ref 39.0–52.0)
Hemoglobin: 14.4 g/dL (ref 13.0–17.0)
Lymphocytes Relative: 12 % (ref 12–46)
Lymphs Abs: 1.2 10*3/uL (ref 0.7–4.0)
MCH: 28 pg (ref 26.0–34.0)
MCHC: 32.5 g/dL (ref 30.0–36.0)
MCV: 86 fL (ref 78.0–100.0)
Monocytes Absolute: 0.5 10*3/uL (ref 0.1–1.0)
Monocytes Relative: 5 % (ref 3–12)
Neutro Abs: 8.2 10*3/uL — ABNORMAL HIGH (ref 1.7–7.7)
Neutrophils Relative %: 83 % — ABNORMAL HIGH (ref 43–77)
Platelets: 141 10*3/uL — ABNORMAL LOW (ref 150–400)
RBC: 5.15 MIL/uL (ref 4.22–5.81)
RDW: 13.8 % (ref 11.5–15.5)
WBC: 9.9 10*3/uL (ref 4.0–10.5)

## 2013-10-01 LAB — BASIC METABOLIC PANEL
BUN: 32 mg/dL — ABNORMAL HIGH (ref 6–23)
CO2: 22 mEq/L (ref 19–32)
Calcium: 9 mg/dL (ref 8.4–10.5)
Chloride: 93 mEq/L — ABNORMAL LOW (ref 96–112)
Creatinine, Ser: 5.97 mg/dL — ABNORMAL HIGH (ref 0.50–1.35)
GFR calc Af Amer: 14 mL/min — ABNORMAL LOW (ref >90)
GFR calc non Af Amer: 12 mL/min — ABNORMAL LOW (ref >90)
Glucose, Bld: 85 mg/dL (ref 70–99)
Potassium: 4.3 mEq/L (ref 3.7–5.3)
Sodium: 137 mEq/L (ref 137–147)

## 2013-10-01 MED ORDER — DIPHENHYDRAMINE HCL 50 MG/ML IJ SOLN
25.0000 mg | Freq: Once | INTRAMUSCULAR | Status: AC
  Administered 2013-10-01: 25 mg via INTRAVENOUS
  Filled 2013-10-01: qty 1

## 2013-10-01 MED ORDER — KETOROLAC TROMETHAMINE 30 MG/ML IJ SOLN
30.0000 mg | Freq: Once | INTRAMUSCULAR | Status: AC
  Administered 2013-10-01: 30 mg via INTRAVENOUS
  Filled 2013-10-01: qty 1

## 2013-10-01 MED ORDER — METOCLOPRAMIDE HCL 5 MG/ML IJ SOLN
10.0000 mg | Freq: Once | INTRAMUSCULAR | Status: AC
  Administered 2013-10-01: 10 mg via INTRAVENOUS
  Filled 2013-10-01: qty 2

## 2013-10-01 NOTE — ED Provider Notes (Signed)
Discussed case with Glendell Docker, NP. Transfer of care from Glendell Docker, NP at change in shift.  Plan is for labs to return and to assess patient's headache - of okay patient can be discharged.   Christopher Wang is a 28 -year-old male with past medical history of anemia, nephrotic syndrome, hypertension who has recently started dialysis within the past couple of months presenting to emergency department with headache, vomiting and shortness of breath-according to previous provider, the shortness of breath has been resolved but patient continues to have the headache with photophobia. Patient recently had dialysis performed this morning.  9:35 PM This provider reassess the patient. Patient was found lying in bed sleeping with covers over his head. Patient reports that he feels better. Denied headache, dizziness, nausea. No episodes of emesis while in ED setting. Vitals control-blood pressure 130/97. Alert and oriented. GCS 15. Heart rate and normal. Lungs clear to auscultation. Negative nystagmus. Unremarkable eye exam. Negative pain upon palpation to the neck. Negative meningeal signs. Pulses strong. AV fistula in LUE noted. Patient ambulated well without ataxia.  Labs reviewed by this provider. CBC negative findings. BMP noted elevation of BUN from 21-32 and creatinine from 5.46-5.97 over one month-patient currently on dialysis - gets dialysis on Tuesdays, Thursday, Saturday. Blood pressure mildly elevated at 160/114.    Results for orders placed during the hospital encounter of 10/01/13  CBC WITH DIFFERENTIAL      Result Value Range   WBC 9.9  4.0 - 10.5 K/uL   RBC 5.15  4.22 - 5.81 MIL/uL   Hemoglobin 14.4  13.0 - 17.0 g/dL   HCT 44.3  39.0 - 52.0 %   MCV 86.0  78.0 - 100.0 fL   MCH 28.0  26.0 - 34.0 pg   MCHC 32.5  30.0 - 36.0 g/dL   RDW 13.8  11.5 - 15.5 %   Platelets 141 (*) 150 - 400 K/uL   Neutrophils Relative % 83 (*) 43 - 77 %   Neutro Abs 8.2 (*) 1.7 - 7.7 K/uL   Lymphocytes Relative 12  12 - 46 %   Lymphs Abs 1.2  0.7 - 4.0 K/uL   Monocytes Relative 5  3 - 12 %   Monocytes Absolute 0.5  0.1 - 1.0 K/uL   Eosinophils Relative 0  0 - 5 %   Eosinophils Absolute 0.0  0.0 - 0.7 K/uL   Basophils Relative 0  0 - 1 %   Basophils Absolute 0.0  0.0 - 0.1 K/uL  BASIC METABOLIC PANEL      Result Value Range   Sodium 137  137 - 147 mEq/L   Potassium 4.3  3.7 - 5.3 mEq/L   Chloride 93 (*) 96 - 112 mEq/L   CO2 22  19 - 32 mEq/L   Glucose, Bld 85  70 - 99 mg/dL   BUN 32 (*) 6 - 23 mg/dL   Creatinine, Ser 5.97 (*) 0.50 - 1.35 mg/dL   Calcium 9.0  8.4 - 10.5 mg/dL   GFR calc non Af Amer 12 (*) >90 mL/min   GFR calc Af Amer 14 (*) >90 mL/min    Ct Head Wo Contrast  10/01/2013   CLINICAL DATA:  Headache.  EXAM: CT HEAD WITHOUT CONTRAST  TECHNIQUE: Contiguous axial images were obtained from the base of the skull through the vertex without intravenous contrast.  COMPARISON:  None.  FINDINGS: No evidence of intracranial hemorrhage, brain edema, or other signs of acute infarction. No evidence of  intracranial mass lesion or mass effect. No abnormal extraaxial fluid collections identified. Ventricles are normal in size. No skull abnormality identified.  IMPRESSION: Negative noncontrast head CT.   Electronically Signed   By: Earle Gell M.D.   On: 10/01/2013 19:52   10:14 Pm This provider spoke with Dr. Jonnie Finner. Discussed case, history, presentation, labs. Recommended that patient follow up with nephrologist. Patient will most likely need to be started on anti-hypertensives.   Filed Vitals:   10/01/13 2130 10/01/13 2138 10/01/13 2139 10/01/13 2249  BP: 137/88 157/113 160/114 134/98  Pulse: 83 81 79 94  Temp:      TempSrc:      Resp:  18  16  SpO2: 100% 97% 99% 97%   Diagnoses that have been ruled out:  None  Diagnoses that are still under consideration:  None  Final diagnoses:  Headache  Blood pressure elevated     Patient not hypertensive crisis. Doubt  stroke. Doubt encephalopathy. Patient's headache has improved. Blood pressure has improved - 134/98. Patient not taking any medications for blood pressure - patient being not medically compliant. Discussed case with Dr. Kirby Funk who agreed to plan of patient to be discharged with follow-up with nephrologist as an outpatient. Discussed with patient that he needs to be placed on blood pressure medications. Discussed with patient to call PCP on Monday to be seen on Monday or Tuesday. Discussed with patient to rest and stay hydrated. Discussed with patient to closely monitor symptoms and if symptoms are to worsen or change to report back to the ED - strict return instructions given.  Patient agreed to plan of care, understood, all questions answered.    Jamse Mead, PA-C 10/02/13 [phone]

## 2013-10-01 NOTE — ED Provider Notes (Signed)
CSN: [id number]     Arrival date & time 10/01/13  1829 History   First MD Initiated Contact with Patient 10/01/13 [place]     Chief Complaint  Patient presents with  . Emesis   (Consider location/radiation/quality/duration/timing/severity/associated sxs/prior Treatment) HPI Comments: Pt state that he went to dialysis this morning without any problem;pt states that he then developed headache and vomiting and sob:the sob has resolved:pt states that he is continuing to have a headache with sensitivity to light:pt states that he does have a history of headaches, but it has been a long time since he has had [id number] numbness or weakness  The history is provided by the patient. No language interpreter was used.    Past Medical History  Diagnosis Date  . Anemia   . Nephrotic syndrome   . Hypertension 08/09/2011   Past Surgical History  Procedure Laterality Date  . Renal biopsy    . Av fistula placement Left 07/18/2013    Procedure: ARTERIOVENOUS (AV) FISTULA CREATION ;  Surgeon: Rosetta Posner, MD;  Location: [facility];  Service: Vascular;  Laterality: Left;  . Insertion of dialysis catheter Right 07/18/2013    Procedure: INSERTION OF DIALYSIS CATHETER;  Surgeon: Rosetta Posner, MD;  Location: [facility] OR;  Service: Vascular;  Laterality: Right;   Family History  Problem Relation Age of Onset  . Hypertension Mother    History  Substance Use Topics  . Smoking status: Passive Smoke Exposure - Never Smoker    Types: Cigars  . Smokeless tobacco: Never Used  . Alcohol Use: 0.6 oz/week    1 Shots of liquor per week    Review of Systems  Constitutional: Negative.   Respiratory: Negative.   Cardiovascular: Negative.     Allergies  Review of patient's allergies indicates no known allergies.  Home Medications   Current Outpatient Rx  Name  Route  Sig  Dispense  Refill  . Multiple Vitamin (MULTIVITAMIN WITH MINERALS) TABS tablet   Oral   Take 1 tablet by mouth daily.          BP 185/130   Pulse 85  Temp(Src) 97.7 F (36.5 C) (Oral)  Resp 16  SpO2 100% Physical Exam  Nursing note and vitals reviewed. Constitutional: He is oriented to person, place, and time. He appears well-developed and well-nourished.  HENT:  Head: Normocephalic and atraumatic.  Right Ear: External ear normal.  Left Ear: External ear normal.  Eyes: Conjunctivae and EOM are normal. Pupils are equal, round, and reactive to light.  Cardiovascular: Normal rate and regular rhythm.   Pulmonary/Chest: Effort normal and breath sounds normal.  Abdominal: Soft. Bowel sounds are normal. There is no tenderness.  Musculoskeletal: Normal range of motion.  Neurological: He is alert and oriented to person, place, and time.  Skin: Skin is warm and dry.    ED Course  Procedures (including critical care time) Labs Review Labs Reviewed - No data to display Imaging Review No results found.  EKG Interpretation   None       MDM  No diagnosis found.  Pt left with [place] PA    Glendell Docker, NP 10/01/13 2007

## 2013-10-01 NOTE — ED Notes (Signed)
Ambulated pt in hall tolerated well.

## 2013-10-01 NOTE — ED Notes (Signed)
To room via EMS.  Pt went to dialysis this morning (missed Wednesday's dialysis), came home laid down went to sleep, woke up some time later, vomiting, headache, short of breath.  EMS BP 162/112 HR 83, CBG 86 SaO2 98%, NSR.  Pt vomiting on arrival to room.

## 2013-10-01 NOTE — ED Notes (Signed)
Patient transported to CT 

## 2013-10-01 NOTE — Discharge Instructions (Signed)
Please call your doctor for a followup appointment within 24-48 hours. When you talk to your doctor please let them know that you were seen in the emergency department and have them acquire all of your records so that they can discuss the findings with you and formulate a treatment plan to fully care for your new and ongoing problems. Please call and set-up an appointment with primary care physician - Dr. Lorrene Reid - to be re-assessed on Monday or Tuesday of this coming week You must discuss with your physician regarding blood pressure medications - as a dialysis patient you should be on blood pressure medications to control blood pressure please discuss this with your provider Please rest and stay hydrated Please continue to monitor symptoms closely and if symptoms are to worsen or change (fever greater than 101, chills, sweating, nausea, vomiting, chest pain, shortness of breath, difficulty breathing, stomach pain, neck pain, neck stiffness, blurred vision, sudden loss of vision, headache, dizziness, confusion) please report back to the ED immediately  Headaches, Frequently Asked Questions MIGRAINE HEADACHES Q: What is migraine? What causes it? How can I treat it? A: Generally, migraine headaches begin as a dull ache. Then they develop into a constant, throbbing, and pulsating pain. You may experience pain at the temples. You may experience pain at the front or back of one or both sides of the head. The pain is usually accompanied by a combination of:  Nausea.  Vomiting.  Sensitivity to light and noise. Some people (about 15%) experience an aura (see below) before an attack. The cause of migraine is believed to be chemical reactions in the brain. Treatment for migraine may include over-the-counter or prescription medications. It may also include self-help techniques. These include relaxation training and biofeedback.  Q: What is an aura? A: About 15% of people with migraine get an "aura". This is a  sign of neurological symptoms that occur before a migraine headache. You may see wavy or jagged lines, dots, or flashing lights. You might experience tunnel vision or blind spots in one or both eyes. The aura can include visual or auditory hallucinations (something imagined). It may include disruptions in smell (such as strange odors), taste or touch. Other symptoms include:  Numbness.  A "pins and needles" sensation.  Difficulty in recalling or speaking the correct word. These neurological events may last as long as 60 minutes. These symptoms will fade as the headache begins. Q: What is a trigger? A: Certain physical or environmental factors can lead to or "trigger" a migraine. These include:  Foods.  Hormonal changes.  Weather.  Stress. It is important to remember that triggers are different for everyone. To help prevent migraine attacks, you need to figure out which triggers affect you. Keep a headache diary. This is a good way to track triggers. The diary will help you talk to your healthcare professional about your condition. Q: Does weather affect migraines? A: Bright sunshine, hot, humid conditions, and drastic changes in barometric pressure may lead to, or "trigger," a migraine attack in some people. But studies have shown that weather does not act as a trigger for everyone with migraines. Q: What is the link between migraine and hormones? A: Hormones start and regulate many of your body's functions. Hormones keep your body in balance within a constantly changing environment. The levels of hormones in your body are unbalanced at times. Examples are during menstruation, pregnancy, or menopause. That can lead to a migraine attack. In fact, about three quarters of all  women with migraine report that their attacks are related to the menstrual cycle.  Q: Is there an increased risk of stroke for migraine sufferers? A: The likelihood of a migraine attack causing a stroke is very remote. That  is not to say that migraine sufferers cannot have a stroke associated with their migraines. In persons under age 35, the most common associated factor for stroke is migraine headache. But over the course of a person's normal life span, the occurrence of migraine headache may actually be associated with a reduced risk of dying from cerebrovascular disease due to stroke.  Q: What are acute medications for migraine? A: Acute medications are used to treat the pain of the headache after it has started. Examples over-the-counter medications, NSAIDs, ergots, and triptans.  Q: What are the triptans? A: Triptans are the newest class of abortive medications. They are specifically targeted to treat migraine. Triptans are vasoconstrictors. They moderate some chemical reactions in the brain. The triptans work on receptors in your brain. Triptans help to restore the balance of a neurotransmitter called serotonin. Fluctuations in levels of serotonin are thought to be a main cause of migraine.  Q: Are over-the-counter medications for migraine effective? A: Over-the-counter, or "OTC," medications may be effective in relieving mild to moderate pain and associated symptoms of migraine. But you should see your caregiver before beginning any treatment regimen for migraine.  Q: What are preventive medications for migraine? A: Preventive medications for migraine are sometimes referred to as "prophylactic" treatments. They are used to reduce the frequency, severity, and length of migraine attacks. Examples of preventive medications include antiepileptic medications, antidepressants, beta-blockers, calcium channel blockers, and NSAIDs (nonsteroidal anti-inflammatory drugs). Q: Why are anticonvulsants used to treat migraine? A: During the past few years, there has been an increased interest in antiepileptic drugs for the prevention of migraine. They are sometimes referred to as "anticonvulsants". Both epilepsy and migraine may be  caused by similar reactions in the brain.  Q: Why are antidepressants used to treat migraine? A: Antidepressants are typically used to treat people with depression. They may reduce migraine frequency by regulating chemical levels, such as serotonin, in the brain.  Q: What alternative therapies are used to treat migraine? A: The term "alternative therapies" is often used to describe treatments considered outside the scope of conventional Western medicine. Examples of alternative therapy include acupuncture, acupressure, and yoga. Another common alternative treatment is herbal therapy. Some herbs are believed to relieve headache pain. Always discuss alternative therapies with your caregiver before proceeding. Some herbal products contain arsenic and other toxins. TENSION HEADACHES Q: What is a tension-type headache? What causes it? How can I treat it? A: Tension-type headaches occur randomly. They are often the result of temporary stress, anxiety, fatigue, or anger. Symptoms include soreness in your temples, a tightening band-like sensation around your head (a "vice-like" ache). Symptoms can also include a pulling feeling, pressure sensations, and contracting head and neck muscles. The headache begins in your forehead, temples, or the back of your head and neck. Treatment for tension-type headache may include over-the-counter or prescription medications. Treatment may also include self-help techniques such as relaxation training and biofeedback. CLUSTER HEADACHES Q: What is a cluster headache? What causes it? How can I treat it? A: Cluster headache gets its name because the attacks come in groups. The pain arrives with little, if any, warning. It is usually on one side of the head. A tearing or bloodshot eye and a runny nose on the same side  of the headache may also accompany the pain. Cluster headaches are believed to be caused by chemical reactions in the brain. They have been described as the most severe  and intense of any headache type. Treatment for cluster headache includes prescription medication and oxygen. SINUS HEADACHES Q: What is a sinus headache? What causes it? How can I treat it? A: When a cavity in the bones of the face and skull (a sinus) becomes inflamed, the inflammation will cause localized pain. This condition is usually the result of an allergic reaction, a tumor, or an infection. If your headache is caused by a sinus blockage, such as an infection, you will probably have a fever. An x-ray will confirm a sinus blockage. Your caregiver's treatment might include antibiotics for the infection, as well as antihistamines or decongestants.  REBOUND HEADACHES Q: What is a rebound headache? What causes it? How can I treat it? A: A pattern of taking acute headache medications too often can lead to a condition known as "rebound headache." A pattern of taking too much headache medication includes taking it more than 2 days per week or in excessive amounts. That means more than the label or a caregiver advises. With rebound headaches, your medications not only stop relieving pain, they actually begin to cause headaches. Doctors treat rebound headache by tapering the medication that is being overused. Sometimes your caregiver will gradually substitute a different type of treatment or medication. Stopping may be a challenge. Regularly overusing a medication increases the potential for serious side effects. Consult a caregiver if you regularly use headache medications more than 2 days per week or more than the label advises. ADDITIONAL QUESTIONS AND ANSWERS Q: What is biofeedback? A: Biofeedback is a self-help treatment. Biofeedback uses special equipment to monitor your body's involuntary physical responses. Biofeedback monitors:  Breathing.  Pulse.  Heart rate.  Temperature.  Muscle tension.  Brain activity. Biofeedback helps you refine and perfect your relaxation exercises. You learn to  control the physical responses that are related to stress. Once the technique has been mastered, you do not need the equipment any more. Q: Are headaches hereditary? A: Four out of five (80%) of people that suffer report a family history of migraine. Scientists are not sure if this is genetic or a family predisposition. Despite the uncertainty, a child has a 50% chance of having migraine if one parent suffers. The child has a 75% chance if both parents suffer.  Q: Can children get headaches? A: By the time they reach high school, most young people have experienced some type of headache. Many safe and effective approaches or medications can prevent a headache from occurring or stop it after it has begun.  Q: What type of doctor should I see to diagnose and treat my headache? A: Start with your primary caregiver. Discuss his or her experience and approach to headaches. Discuss methods of classification, diagnosis, and treatment. Your caregiver may decide to recommend you to a headache specialist, depending upon your symptoms or other physical conditions. Having diabetes, allergies, etc., may require a more comprehensive and inclusive approach to your headache. The National Headache Foundation will provide, upon request, a list of [facility] physician members in your state. Document Released: 12/06/2003 Document Revised: 12/08/2011 Document Reviewed: 05/15/2008 [facility] Patient Information 2014 Doctor Phillips.   Emergency Department Resource Guide 1) Find a Doctor and Pay Out of Pocket Although you won't have to find out who is covered by your insurance plan, it is a good idea  to ask around and get recommendations. You will then need to call the office and see if the doctor you have chosen will accept you as a new patient and what types of options they offer for patients who are self-pay. Some doctors offer discounts or will set up payment plans for their patients who do not have insurance, but you will need to  ask so you aren't surprised when you get to your appointment.  2) Contact Your Local Health Department Not all health departments have doctors that can see patients for sick visits, but many do, so it is worth a call to see if yours does. If you don't know where your local health department is, you can check in your phone book. The CDC also has a tool to help you locate your state's health department, and many state websites also have listings of all of their local health departments.  3) Find a [place] [facility] If your illness is not likely to be very severe or complicated, you may want to try a walk in clinic. These are popping up all over the country in pharmacies, drugstores, and shopping centers. They're usually staffed by nurse practitioners or physician assistants that have been trained to treat common illnesses and complaints. They're usually fairly quick and inexpensive. However, if you have serious medical issues or chronic medical problems, these are probably not your best option.  No Primary Care Doctor: - Call Health Connect at  [phone] - they can help you locate a primary care doctor that  accepts your insurance, provides certain services, etc. - Physician Referral Service- [phone]  Chronic Pain Problems: Organization         Address  Phone   Notes  [facility]  [phone] Patients need to be referred by their primary care doctor.   Medication Assistance: Organization         Address  Phone   Notes  [facility] Medication [facility] [address] [place], [place] [address] [phone] --Must be a [facility] of [facility] -- Must have NO insurance coverage whatsoever (no Medicaid/ Medicare, etc.) -- The pt. MUST have a primary care doctor that directs their care regularly and follows them in the community   MedAssist  [phone]   [employer]  [phone]    Agencies that provide inexpensive medical  care: Organization         Address  Phone   Notes  [place]  [phone]   [facility]    [phone]   [facility] [place], [place] [address] [phone]   [place] [address] [place] [phone]   Planned Parenthood    [phone]   [facility]    [phone]   [place] and [place] [address], [place] Phone:  [phone], Fax:  [phone] Hours of Operation:  9 am - 6 pm, M-F.  Also accepts Medicaid/Medicare and self-pay.  [facility] for [place], [address], [place] Phone: [phone], Fax: [phone]. Hours of Operation:  8:30 am - 5:30 pm, M-F.  Also accepts Medicaid and self-pay.  [facility] [place] [address] [place] Phone: [phone]   [place], [place], [place] [phone], Ext. [phone] Mondays & Thursdays: 7-9 AM.  First 15 patients  are seen on a first come, first serve basis.    [place] Providers:  Organization         Address  Phone   Notes  [facility] [address], [place] [phone] Also accepts self-pay patients.  [facility] [id number] [place], [place]  [phone]   [place], [address], [place] [phone]   [facility] [address] [place] [phone]   Lucianne Lei [address], [place]   [phone] Only accepts [place] Access [place] patients after they have their name applied to their card.   Self-Pay (no insurance) in [facility]:  Organization         Address  Phone   Notes  Sickle Cell Patients, [facility] [place] [phone]   [facility] [place] [phone]   [facility] [place]  [place], [place], [place] [phone]   [facility]/Dr. Osei-Bonsu  [address], [place] or [address], [place] [phone] Phone number for both [place] and [place] locations is the same.  [facility]gent [facility] and [facility] [address], [place] [phone]   [facility] [address], [place] or [address] [phone]   [facility] [address] [place] [phone], phone; [phone], fax Sees patients 1st and 3rd Saturday of every month.  Must not qualify for public or private insurance (i.e. Medicaid, Medicare, [place] Health Choice, Veterans' Benefits)  Household income should be no more than 200% of the poverty level The clinic cannot treat you if you are pregnant or think you are pregnant  Sexually transmitted diseases are not treated at the clinic.    Dental Care: Organization         Address  Phone  Notes  [facility] of [facility] [place] [phone] Accepts children up to age 3 who are enrolled in [place] or [place]; pregnant women with a Medicaid card; and children who have applied for Medicaid or [place] Health Choice, but were declined, whose parents can pay a reduced fee at time of service.  [facility] of [facility]  [address], [place] [phone] Accepts children up to age 45 who are enrolled in [place] or [place]; pregnant women with a Medicaid card; and children who have applied for Medicaid or [place] Health Choice, but were declined, whose parents can pay a reduced fee at time of service.  [place] Adult Dental Access PROGRAM  [place] [phone] Patients are seen by appointment only. Walk-ins are not accepted. [place] will see patients 31 years of age and older. Monday - Tuesday (8am-5pm) Most Wednesdays (8:30-5pm) $30 per visit, cash only  [facility] Adult Dental Access PROGRAM  [address], [facility] [phone] Patients are seen by appointment only. Walk-ins are not accepted. [place] will see patients 39 years of age and older. One Wednesday Evening (Monthly: Volunteer Based).  $30 per visit, cash only  [place]  [phone] for adults; Children under age 62, call Graduate Pediatric Dentistry at [phone]. Children aged 63-14, please call [phone] to request a pediatric application.  Dental services are provided in all areas of dental care including fillings, crowns and bridges, complete and partial dentures, implants, gum treatment, root canals, and extractions. Preventive care is also provided. Treatment is provided to both adults and children. Patients are selected via a lottery and there is often a waiting list.   [facility] [address], [place]  [phone] [email or web address]   [facility] [address] [place], [place] [phone], Ext. [phone] Second and Fourth Thursday of each month, opens at 6:30 AM; Clinic ends at 9 AM.  Patients are seen on a first-come first-served basis, and a limited number are seen during each clinic.   [facility]  [address] Hillard Danker [place], [place] [phone]   Eligibility Requirements You must have lived in [place], [place], or [place] counties for at least the last three months.   You cannot be eligible for state or federal sponsored [employer], including [employer], [place], or [employer].   You generally cannot be eligible for healthcare insurance through your employer.    How to apply: Eligibility screenings are held every Tuesday and Wednesday afternoon from 1:00 pm until 4:00 pm. You do not need an appointment for the interview!   [facility] [address], [place], [place]   [place]  [place] Department  [place]  [phone]    Behavioral Health Resources in the Community: Intensive Outpatient Programs Organization         Address  Phone  Notes  [place]. [address], [place], [place] [phone]   [facility] Outpatient [address], [place], [place]   ADS: Alcohol & Drug Svcs [address] [place], [place]   [place] [address]  [place], [place] or [phone]   Substance Abuse Resources Organization         Address  Phone  Notes  Alcohol and Drug Services  [phone]   [place]  [phone]   The [place]   Chinita Pester  [phone]   Residential & Outpatient Substance Abuse Program  [phone]   Psychological Services Organization         Address  Phone  Notes  [facility] [place]  [place]  [phone]   [place] [address] [place] or [phone]    Mobile Crisis Teams Organization         Address  Phone  Notes  [facility], Mobile Crisis Care Unit  [phone]   [facility]  [address] [place], [place]   [place] Levels [address] [place] [phone]    Self-Help/Support Groups Organization         Address  Phone             Notes  [place]. of [place] - variety of support groups  [place] Call for more information  Narcotics Anonymous (NA), Caring [facility] [address], [place]  2 meetings at this location   Brewing technologist  Notes  ASAP Residential Treatment [place],    [place]  [place]  [address], [place] [address], [place], [place]   [place], [place] [phone] Admissions: 8am-3pm M-F  Incentives Substance [place] [address],    [place], [place] [phone]   [facility] [address] [place], [place], [place]   [facility] [address],  [place], [place]   [facility]ight Programs - Intensive Outpatient [address] Kristeen [address], [place], [place]   [facility] [place].,  [place], [place] [phone] or [phone]   [facility]idential Treatment Services (RTS) [address], [place], [place] Accepts Medicaid  [facility] [place] [address]  [place] [phone] Substance Abuse/Addiction Treatment   [facility] Organization         Address  Phone  Notes  [facility]  [phone]   Domenic Schwab, PhD [address] Arlis Porta [place], [place]   [phone] or [phone]   [place], [place] [phone]   [facility] [address] [place], [place] [phone] Insurance/Medicaid/sponsorship through [facility] and Families [address] [place]                                    [place], [place] [phone] [place] [address], [place] [phone]    Dr. Adele Schilder  [phone]   [facility] Dept. 1) [address], [place] 2) [place] 3)  [place] [address], [place] [phone]  [phone]   [place] [phone] or [phone] (After Hours)

## 2013-10-02 NOTE — ED Provider Notes (Signed)
Medical screening examination/treatment/procedure(s) were conducted as a shared visit with non-physician practitioner(s) and myself.  I personally evaluated the patient during the encounter.  EKG Interpretation   None       Pt had dialysis today and then woke up with HA.  Not worst of life but started within the last 6 hours and Head CT neg so low suspicion for [facility].  Pt has no neuro deficits and hx of migraines in the past.  Will give headache cocktail.  Also pt is HTN here and states since starting dialysis he was taken off BP meds.  [name], MD 10/02/13 1116

## 2013-10-02 NOTE — ED Provider Notes (Signed)
I was available for consultation during the completion of this patient's care.  Please see the initial providers notes for the initial evaluation.  Carmin Muskrat, MD 10/02/13 2255

## 2013-10-04 DIAGNOSIS — N2581 Secondary hyperparathyroidism of renal origin: Secondary | ICD-10-CM | POA: Diagnosis not present

## 2013-10-04 DIAGNOSIS — Z23 Encounter for immunization: Secondary | ICD-10-CM | POA: Diagnosis not present

## 2013-10-04 DIAGNOSIS — D631 Anemia in chronic kidney disease: Secondary | ICD-10-CM | POA: Diagnosis not present

## 2013-10-04 DIAGNOSIS — N186 End stage renal disease: Secondary | ICD-10-CM | POA: Diagnosis not present

## 2013-10-04 DIAGNOSIS — D509 Iron deficiency anemia, unspecified: Secondary | ICD-10-CM | POA: Diagnosis not present

## 2013-10-06 DIAGNOSIS — Z23 Encounter for immunization: Secondary | ICD-10-CM | POA: Diagnosis not present

## 2013-10-06 DIAGNOSIS — N186 End stage renal disease: Secondary | ICD-10-CM | POA: Diagnosis not present

## 2013-10-06 DIAGNOSIS — D509 Iron deficiency anemia, unspecified: Secondary | ICD-10-CM | POA: Diagnosis not present

## 2013-10-06 DIAGNOSIS — N2581 Secondary hyperparathyroidism of renal origin: Secondary | ICD-10-CM | POA: Diagnosis not present

## 2013-10-06 DIAGNOSIS — D631 Anemia in chronic kidney disease: Secondary | ICD-10-CM | POA: Diagnosis not present

## 2013-10-08 DIAGNOSIS — D509 Iron deficiency anemia, unspecified: Secondary | ICD-10-CM | POA: Diagnosis not present

## 2013-10-08 DIAGNOSIS — D631 Anemia in chronic kidney disease: Secondary | ICD-10-CM | POA: Diagnosis not present

## 2013-10-08 DIAGNOSIS — Z23 Encounter for immunization: Secondary | ICD-10-CM | POA: Diagnosis not present

## 2013-10-08 DIAGNOSIS — N2581 Secondary hyperparathyroidism of renal origin: Secondary | ICD-10-CM | POA: Diagnosis not present

## 2013-10-08 DIAGNOSIS — N186 End stage renal disease: Secondary | ICD-10-CM | POA: Diagnosis not present

## 2013-10-11 DIAGNOSIS — Z23 Encounter for immunization: Secondary | ICD-10-CM | POA: Diagnosis not present

## 2013-10-11 DIAGNOSIS — N2581 Secondary hyperparathyroidism of renal origin: Secondary | ICD-10-CM | POA: Diagnosis not present

## 2013-10-11 DIAGNOSIS — D509 Iron deficiency anemia, unspecified: Secondary | ICD-10-CM | POA: Diagnosis not present

## 2013-10-11 DIAGNOSIS — N186 End stage renal disease: Secondary | ICD-10-CM | POA: Diagnosis not present

## 2013-10-11 DIAGNOSIS — D631 Anemia in chronic kidney disease: Secondary | ICD-10-CM | POA: Diagnosis not present

## 2013-10-13 DIAGNOSIS — Z23 Encounter for immunization: Secondary | ICD-10-CM | POA: Diagnosis not present

## 2013-10-13 DIAGNOSIS — N186 End stage renal disease: Secondary | ICD-10-CM | POA: Diagnosis not present

## 2013-10-13 DIAGNOSIS — D631 Anemia in chronic kidney disease: Secondary | ICD-10-CM | POA: Diagnosis not present

## 2013-10-13 DIAGNOSIS — N039 Chronic nephritic syndrome with unspecified morphologic changes: Secondary | ICD-10-CM | POA: Diagnosis not present

## 2013-10-13 DIAGNOSIS — D509 Iron deficiency anemia, unspecified: Secondary | ICD-10-CM | POA: Diagnosis not present

## 2013-10-13 DIAGNOSIS — N2581 Secondary hyperparathyroidism of renal origin: Secondary | ICD-10-CM | POA: Diagnosis not present

## 2013-10-15 DIAGNOSIS — N039 Chronic nephritic syndrome with unspecified morphologic changes: Secondary | ICD-10-CM | POA: Diagnosis not present

## 2013-10-15 DIAGNOSIS — Z23 Encounter for immunization: Secondary | ICD-10-CM | POA: Diagnosis not present

## 2013-10-15 DIAGNOSIS — D631 Anemia in chronic kidney disease: Secondary | ICD-10-CM | POA: Diagnosis not present

## 2013-10-15 DIAGNOSIS — D509 Iron deficiency anemia, unspecified: Secondary | ICD-10-CM | POA: Diagnosis not present

## 2013-10-15 DIAGNOSIS — N186 End stage renal disease: Secondary | ICD-10-CM | POA: Diagnosis not present

## 2013-10-15 DIAGNOSIS — N2581 Secondary hyperparathyroidism of renal origin: Secondary | ICD-10-CM | POA: Diagnosis not present

## 2013-10-18 DIAGNOSIS — D509 Iron deficiency anemia, unspecified: Secondary | ICD-10-CM | POA: Diagnosis not present

## 2013-10-18 DIAGNOSIS — Z23 Encounter for immunization: Secondary | ICD-10-CM | POA: Diagnosis not present

## 2013-10-18 DIAGNOSIS — D631 Anemia in chronic kidney disease: Secondary | ICD-10-CM | POA: Diagnosis not present

## 2013-10-18 DIAGNOSIS — N2581 Secondary hyperparathyroidism of renal origin: Secondary | ICD-10-CM | POA: Diagnosis not present

## 2013-10-18 DIAGNOSIS — N186 End stage renal disease: Secondary | ICD-10-CM | POA: Diagnosis not present

## 2013-10-20 DIAGNOSIS — N186 End stage renal disease: Secondary | ICD-10-CM | POA: Diagnosis not present

## 2013-10-20 DIAGNOSIS — N2581 Secondary hyperparathyroidism of renal origin: Secondary | ICD-10-CM | POA: Diagnosis not present

## 2013-10-20 DIAGNOSIS — Z23 Encounter for immunization: Secondary | ICD-10-CM | POA: Diagnosis not present

## 2013-10-20 DIAGNOSIS — D631 Anemia in chronic kidney disease: Secondary | ICD-10-CM | POA: Diagnosis not present

## 2013-10-20 DIAGNOSIS — D509 Iron deficiency anemia, unspecified: Secondary | ICD-10-CM | POA: Diagnosis not present

## 2013-10-20 DIAGNOSIS — N039 Chronic nephritic syndrome with unspecified morphologic changes: Secondary | ICD-10-CM | POA: Diagnosis not present

## 2013-10-21 DIAGNOSIS — Z452 Encounter for adjustment and management of vascular access device: Secondary | ICD-10-CM | POA: Diagnosis not present

## 2013-10-21 DIAGNOSIS — N186 End stage renal disease: Secondary | ICD-10-CM | POA: Diagnosis not present

## 2013-10-22 DIAGNOSIS — Z23 Encounter for immunization: Secondary | ICD-10-CM | POA: Diagnosis not present

## 2013-10-22 DIAGNOSIS — D509 Iron deficiency anemia, unspecified: Secondary | ICD-10-CM | POA: Diagnosis not present

## 2013-10-22 DIAGNOSIS — N2581 Secondary hyperparathyroidism of renal origin: Secondary | ICD-10-CM | POA: Diagnosis not present

## 2013-10-22 DIAGNOSIS — D631 Anemia in chronic kidney disease: Secondary | ICD-10-CM | POA: Diagnosis not present

## 2013-10-22 DIAGNOSIS — N186 End stage renal disease: Secondary | ICD-10-CM | POA: Diagnosis not present

## 2013-10-25 DIAGNOSIS — Z23 Encounter for immunization: Secondary | ICD-10-CM | POA: Diagnosis not present

## 2013-10-25 DIAGNOSIS — D509 Iron deficiency anemia, unspecified: Secondary | ICD-10-CM | POA: Diagnosis not present

## 2013-10-25 DIAGNOSIS — N2581 Secondary hyperparathyroidism of renal origin: Secondary | ICD-10-CM | POA: Diagnosis not present

## 2013-10-25 DIAGNOSIS — D631 Anemia in chronic kidney disease: Secondary | ICD-10-CM | POA: Diagnosis not present

## 2013-10-25 DIAGNOSIS — N186 End stage renal disease: Secondary | ICD-10-CM | POA: Diagnosis not present

## 2013-10-27 DIAGNOSIS — D631 Anemia in chronic kidney disease: Secondary | ICD-10-CM | POA: Diagnosis not present

## 2013-10-27 DIAGNOSIS — N039 Chronic nephritic syndrome with unspecified morphologic changes: Secondary | ICD-10-CM | POA: Diagnosis not present

## 2013-10-27 DIAGNOSIS — N2581 Secondary hyperparathyroidism of renal origin: Secondary | ICD-10-CM | POA: Diagnosis not present

## 2013-10-27 DIAGNOSIS — N186 End stage renal disease: Secondary | ICD-10-CM | POA: Diagnosis not present

## 2013-10-27 DIAGNOSIS — Z23 Encounter for immunization: Secondary | ICD-10-CM | POA: Diagnosis not present

## 2013-10-27 DIAGNOSIS — D509 Iron deficiency anemia, unspecified: Secondary | ICD-10-CM | POA: Diagnosis not present

## 2013-10-29 DIAGNOSIS — D509 Iron deficiency anemia, unspecified: Secondary | ICD-10-CM | POA: Diagnosis not present

## 2013-10-29 DIAGNOSIS — N2581 Secondary hyperparathyroidism of renal origin: Secondary | ICD-10-CM | POA: Diagnosis not present

## 2013-10-29 DIAGNOSIS — D631 Anemia in chronic kidney disease: Secondary | ICD-10-CM | POA: Diagnosis not present

## 2013-10-29 DIAGNOSIS — Z23 Encounter for immunization: Secondary | ICD-10-CM | POA: Diagnosis not present

## 2013-10-29 DIAGNOSIS — N186 End stage renal disease: Secondary | ICD-10-CM | POA: Diagnosis not present

## 2013-10-31 ENCOUNTER — Encounter: Payer: Self-pay | Admitting: Vascular Surgery

## 2013-11-01 ENCOUNTER — Ambulatory Visit (INDEPENDENT_AMBULATORY_CARE_PROVIDER_SITE_OTHER): Payer: Medicare Other | Admitting: Vascular Surgery

## 2013-11-01 ENCOUNTER — Ambulatory Visit (HOSPITAL_COMMUNITY)
Admission: RE | Admit: 2013-11-01 | Discharge: 2013-11-01 | Disposition: A | Discharge status: Home / Self Care | Payer: Medicare Other | Source: Ambulatory Visit | Attending: Vascular Surgery | Admitting: Vascular Surgery

## 2013-11-01 ENCOUNTER — Encounter: Payer: Self-pay | Admitting: Vascular Surgery

## 2013-11-01 VITALS — BP 141/88 | HR 103 | Resp 16 | Ht 72.0 in | Wt 154.0 lb

## 2013-11-01 DIAGNOSIS — N186 End stage renal disease: Secondary | ICD-10-CM

## 2013-11-01 DIAGNOSIS — Z4931 Encounter for adequacy testing for hemodialysis: Secondary | ICD-10-CM | POA: Diagnosis not present

## 2013-11-01 DIAGNOSIS — D509 Iron deficiency anemia, unspecified: Secondary | ICD-10-CM | POA: Diagnosis not present

## 2013-11-01 DIAGNOSIS — D631 Anemia in chronic kidney disease: Secondary | ICD-10-CM | POA: Diagnosis not present

## 2013-11-01 DIAGNOSIS — N2581 Secondary hyperparathyroidism of renal origin: Secondary | ICD-10-CM | POA: Diagnosis not present

## 2013-11-01 DIAGNOSIS — Z992 Dependence on renal dialysis: Secondary | ICD-10-CM | POA: Diagnosis not present

## 2013-11-01 NOTE — Progress Notes (Signed)
The patient is a for followup of his left Cimino radiocephalic AV fistula creation by myself on 07/18/2013. He initially was dialyzing via a hemodialysis catheter. He does have a functioning fistula in his been using this for several weeks.  On physical exam today is wrist incision is completely healed and he has an excellent thrill. He is very nice maturation of the cephalic vein throughout its course of physical exam. She did undergo dialysis today I did remove his dressing. The artery using the buttonhole technique at the dialysis center and this seems to be effective currently.  Noninvasive vascular laboratory studies today reveal excellent size maturation ranging from 0.8-1.0 cm throughout its course. There is no evidence of stenosis in the fistula.  Depression plan: Stable followup after AV fistula creation with a functional fistula which is working adequately for hemodialysis. He will continue his use of this and see [place] on an as-needed basis.

## 2013-11-03 DIAGNOSIS — N2581 Secondary hyperparathyroidism of renal origin: Secondary | ICD-10-CM | POA: Diagnosis not present

## 2013-11-03 DIAGNOSIS — N186 End stage renal disease: Secondary | ICD-10-CM | POA: Diagnosis not present

## 2013-11-03 DIAGNOSIS — D631 Anemia in chronic kidney disease: Secondary | ICD-10-CM | POA: Diagnosis not present

## 2013-11-03 DIAGNOSIS — D509 Iron deficiency anemia, unspecified: Secondary | ICD-10-CM | POA: Diagnosis not present

## 2013-11-05 DIAGNOSIS — N186 End stage renal disease: Secondary | ICD-10-CM | POA: Diagnosis not present

## 2013-11-05 DIAGNOSIS — N2581 Secondary hyperparathyroidism of renal origin: Secondary | ICD-10-CM | POA: Diagnosis not present

## 2013-11-05 DIAGNOSIS — D631 Anemia in chronic kidney disease: Secondary | ICD-10-CM | POA: Diagnosis not present

## 2013-11-05 DIAGNOSIS — D509 Iron deficiency anemia, unspecified: Secondary | ICD-10-CM | POA: Diagnosis not present

## 2013-11-08 DIAGNOSIS — D631 Anemia in chronic kidney disease: Secondary | ICD-10-CM | POA: Diagnosis not present

## 2013-11-08 DIAGNOSIS — N039 Chronic nephritic syndrome with unspecified morphologic changes: Secondary | ICD-10-CM | POA: Diagnosis not present

## 2013-11-08 DIAGNOSIS — D509 Iron deficiency anemia, unspecified: Secondary | ICD-10-CM | POA: Diagnosis not present

## 2013-11-08 DIAGNOSIS — N2581 Secondary hyperparathyroidism of renal origin: Secondary | ICD-10-CM | POA: Diagnosis not present

## 2013-11-08 DIAGNOSIS — N186 End stage renal disease: Secondary | ICD-10-CM | POA: Diagnosis not present

## 2013-11-10 DIAGNOSIS — N186 End stage renal disease: Secondary | ICD-10-CM | POA: Diagnosis not present

## 2013-11-10 DIAGNOSIS — D509 Iron deficiency anemia, unspecified: Secondary | ICD-10-CM | POA: Diagnosis not present

## 2013-11-10 DIAGNOSIS — D631 Anemia in chronic kidney disease: Secondary | ICD-10-CM | POA: Diagnosis not present

## 2013-11-10 DIAGNOSIS — N2581 Secondary hyperparathyroidism of renal origin: Secondary | ICD-10-CM | POA: Diagnosis not present

## 2013-11-12 DIAGNOSIS — N039 Chronic nephritic syndrome with unspecified morphologic changes: Secondary | ICD-10-CM | POA: Diagnosis not present

## 2013-11-12 DIAGNOSIS — D509 Iron deficiency anemia, unspecified: Secondary | ICD-10-CM | POA: Diagnosis not present

## 2013-11-12 DIAGNOSIS — N2581 Secondary hyperparathyroidism of renal origin: Secondary | ICD-10-CM | POA: Diagnosis not present

## 2013-11-12 DIAGNOSIS — N186 End stage renal disease: Secondary | ICD-10-CM | POA: Diagnosis not present

## 2013-11-12 DIAGNOSIS — D631 Anemia in chronic kidney disease: Secondary | ICD-10-CM | POA: Diagnosis not present

## 2013-11-15 DIAGNOSIS — N039 Chronic nephritic syndrome with unspecified morphologic changes: Secondary | ICD-10-CM | POA: Diagnosis not present

## 2013-11-15 DIAGNOSIS — D631 Anemia in chronic kidney disease: Secondary | ICD-10-CM | POA: Diagnosis not present

## 2013-11-15 DIAGNOSIS — N2581 Secondary hyperparathyroidism of renal origin: Secondary | ICD-10-CM | POA: Diagnosis not present

## 2013-11-15 DIAGNOSIS — D509 Iron deficiency anemia, unspecified: Secondary | ICD-10-CM | POA: Diagnosis not present

## 2013-11-15 DIAGNOSIS — N186 End stage renal disease: Secondary | ICD-10-CM | POA: Diagnosis not present

## 2013-11-17 DIAGNOSIS — N186 End stage renal disease: Secondary | ICD-10-CM | POA: Diagnosis not present

## 2013-11-17 DIAGNOSIS — N2581 Secondary hyperparathyroidism of renal origin: Secondary | ICD-10-CM | POA: Diagnosis not present

## 2013-11-17 DIAGNOSIS — D631 Anemia in chronic kidney disease: Secondary | ICD-10-CM | POA: Diagnosis not present

## 2013-11-17 DIAGNOSIS — D509 Iron deficiency anemia, unspecified: Secondary | ICD-10-CM | POA: Diagnosis not present

## 2013-11-17 DIAGNOSIS — N039 Chronic nephritic syndrome with unspecified morphologic changes: Secondary | ICD-10-CM | POA: Diagnosis not present

## 2013-11-19 DIAGNOSIS — D509 Iron deficiency anemia, unspecified: Secondary | ICD-10-CM | POA: Diagnosis not present

## 2013-11-19 DIAGNOSIS — N039 Chronic nephritic syndrome with unspecified morphologic changes: Secondary | ICD-10-CM | POA: Diagnosis not present

## 2013-11-19 DIAGNOSIS — D631 Anemia in chronic kidney disease: Secondary | ICD-10-CM | POA: Diagnosis not present

## 2013-11-19 DIAGNOSIS — N2581 Secondary hyperparathyroidism of renal origin: Secondary | ICD-10-CM | POA: Diagnosis not present

## 2013-11-19 DIAGNOSIS — N186 End stage renal disease: Secondary | ICD-10-CM | POA: Diagnosis not present

## 2013-11-22 DIAGNOSIS — N039 Chronic nephritic syndrome with unspecified morphologic changes: Secondary | ICD-10-CM | POA: Diagnosis not present

## 2013-11-22 DIAGNOSIS — D509 Iron deficiency anemia, unspecified: Secondary | ICD-10-CM | POA: Diagnosis not present

## 2013-11-22 DIAGNOSIS — N2581 Secondary hyperparathyroidism of renal origin: Secondary | ICD-10-CM | POA: Diagnosis not present

## 2013-11-22 DIAGNOSIS — D631 Anemia in chronic kidney disease: Secondary | ICD-10-CM | POA: Diagnosis not present

## 2013-11-22 DIAGNOSIS — N186 End stage renal disease: Secondary | ICD-10-CM | POA: Diagnosis not present

## 2013-11-24 DIAGNOSIS — N039 Chronic nephritic syndrome with unspecified morphologic changes: Secondary | ICD-10-CM | POA: Diagnosis not present

## 2013-11-24 DIAGNOSIS — N2581 Secondary hyperparathyroidism of renal origin: Secondary | ICD-10-CM | POA: Diagnosis not present

## 2013-11-24 DIAGNOSIS — D631 Anemia in chronic kidney disease: Secondary | ICD-10-CM | POA: Diagnosis not present

## 2013-11-24 DIAGNOSIS — D509 Iron deficiency anemia, unspecified: Secondary | ICD-10-CM | POA: Diagnosis not present

## 2013-11-24 DIAGNOSIS — N186 End stage renal disease: Secondary | ICD-10-CM | POA: Diagnosis not present

## 2013-11-25 DIAGNOSIS — D509 Iron deficiency anemia, unspecified: Secondary | ICD-10-CM | POA: Diagnosis not present

## 2013-11-25 DIAGNOSIS — N2581 Secondary hyperparathyroidism of renal origin: Secondary | ICD-10-CM | POA: Diagnosis not present

## 2013-11-25 DIAGNOSIS — N186 End stage renal disease: Secondary | ICD-10-CM | POA: Diagnosis not present

## 2013-11-25 DIAGNOSIS — D631 Anemia in chronic kidney disease: Secondary | ICD-10-CM | POA: Diagnosis not present

## 2013-11-26 DIAGNOSIS — N186 End stage renal disease: Secondary | ICD-10-CM | POA: Diagnosis not present

## 2013-11-29 DIAGNOSIS — N186 End stage renal disease: Secondary | ICD-10-CM | POA: Diagnosis not present

## 2013-12-01 DIAGNOSIS — N186 End stage renal disease: Secondary | ICD-10-CM | POA: Diagnosis not present

## 2013-12-03 DIAGNOSIS — N186 End stage renal disease: Secondary | ICD-10-CM | POA: Diagnosis not present

## 2013-12-06 DIAGNOSIS — D631 Anemia in chronic kidney disease: Secondary | ICD-10-CM | POA: Diagnosis not present

## 2013-12-06 DIAGNOSIS — N2581 Secondary hyperparathyroidism of renal origin: Secondary | ICD-10-CM | POA: Diagnosis not present

## 2013-12-06 DIAGNOSIS — N039 Chronic nephritic syndrome with unspecified morphologic changes: Secondary | ICD-10-CM | POA: Diagnosis not present

## 2013-12-06 DIAGNOSIS — D509 Iron deficiency anemia, unspecified: Secondary | ICD-10-CM | POA: Diagnosis not present

## 2013-12-06 DIAGNOSIS — N186 End stage renal disease: Secondary | ICD-10-CM | POA: Diagnosis not present

## 2013-12-08 DIAGNOSIS — N186 End stage renal disease: Secondary | ICD-10-CM | POA: Diagnosis not present

## 2013-12-08 DIAGNOSIS — D509 Iron deficiency anemia, unspecified: Secondary | ICD-10-CM | POA: Diagnosis not present

## 2013-12-08 DIAGNOSIS — D631 Anemia in chronic kidney disease: Secondary | ICD-10-CM | POA: Diagnosis not present

## 2013-12-08 DIAGNOSIS — N2581 Secondary hyperparathyroidism of renal origin: Secondary | ICD-10-CM | POA: Diagnosis not present

## 2013-12-10 DIAGNOSIS — N186 End stage renal disease: Secondary | ICD-10-CM | POA: Diagnosis not present

## 2013-12-10 DIAGNOSIS — N2581 Secondary hyperparathyroidism of renal origin: Secondary | ICD-10-CM | POA: Diagnosis not present

## 2013-12-10 DIAGNOSIS — D509 Iron deficiency anemia, unspecified: Secondary | ICD-10-CM | POA: Diagnosis not present

## 2013-12-10 DIAGNOSIS — D631 Anemia in chronic kidney disease: Secondary | ICD-10-CM | POA: Diagnosis not present

## 2013-12-13 DIAGNOSIS — N039 Chronic nephritic syndrome with unspecified morphologic changes: Secondary | ICD-10-CM | POA: Diagnosis not present

## 2013-12-13 DIAGNOSIS — D631 Anemia in chronic kidney disease: Secondary | ICD-10-CM | POA: Diagnosis not present

## 2013-12-13 DIAGNOSIS — N186 End stage renal disease: Secondary | ICD-10-CM | POA: Diagnosis not present

## 2013-12-13 DIAGNOSIS — N2581 Secondary hyperparathyroidism of renal origin: Secondary | ICD-10-CM | POA: Diagnosis not present

## 2013-12-13 DIAGNOSIS — D509 Iron deficiency anemia, unspecified: Secondary | ICD-10-CM | POA: Diagnosis not present

## 2013-12-15 DIAGNOSIS — D631 Anemia in chronic kidney disease: Secondary | ICD-10-CM | POA: Diagnosis not present

## 2013-12-15 DIAGNOSIS — N2581 Secondary hyperparathyroidism of renal origin: Secondary | ICD-10-CM | POA: Diagnosis not present

## 2013-12-15 DIAGNOSIS — D509 Iron deficiency anemia, unspecified: Secondary | ICD-10-CM | POA: Diagnosis not present

## 2013-12-15 DIAGNOSIS — N186 End stage renal disease: Secondary | ICD-10-CM | POA: Diagnosis not present

## 2013-12-17 DIAGNOSIS — N186 End stage renal disease: Secondary | ICD-10-CM | POA: Diagnosis not present

## 2013-12-17 DIAGNOSIS — D509 Iron deficiency anemia, unspecified: Secondary | ICD-10-CM | POA: Diagnosis not present

## 2013-12-17 DIAGNOSIS — D631 Anemia in chronic kidney disease: Secondary | ICD-10-CM | POA: Diagnosis not present

## 2013-12-17 DIAGNOSIS — N2581 Secondary hyperparathyroidism of renal origin: Secondary | ICD-10-CM | POA: Diagnosis not present

## 2013-12-19 DIAGNOSIS — N186 End stage renal disease: Secondary | ICD-10-CM | POA: Diagnosis not present

## 2013-12-19 DIAGNOSIS — N039 Chronic nephritic syndrome with unspecified morphologic changes: Secondary | ICD-10-CM | POA: Diagnosis not present

## 2013-12-19 DIAGNOSIS — N2581 Secondary hyperparathyroidism of renal origin: Secondary | ICD-10-CM | POA: Diagnosis not present

## 2013-12-19 DIAGNOSIS — D509 Iron deficiency anemia, unspecified: Secondary | ICD-10-CM | POA: Diagnosis not present

## 2013-12-19 DIAGNOSIS — D631 Anemia in chronic kidney disease: Secondary | ICD-10-CM | POA: Diagnosis not present

## 2013-12-22 DIAGNOSIS — N039 Chronic nephritic syndrome with unspecified morphologic changes: Secondary | ICD-10-CM | POA: Diagnosis not present

## 2013-12-22 DIAGNOSIS — D631 Anemia in chronic kidney disease: Secondary | ICD-10-CM | POA: Diagnosis not present

## 2013-12-22 DIAGNOSIS — N186 End stage renal disease: Secondary | ICD-10-CM | POA: Diagnosis not present

## 2013-12-24 DIAGNOSIS — D631 Anemia in chronic kidney disease: Secondary | ICD-10-CM | POA: Diagnosis not present

## 2013-12-24 DIAGNOSIS — N039 Chronic nephritic syndrome with unspecified morphologic changes: Secondary | ICD-10-CM | POA: Diagnosis not present

## 2013-12-24 DIAGNOSIS — N186 End stage renal disease: Secondary | ICD-10-CM | POA: Diagnosis not present

## 2013-12-27 DIAGNOSIS — N186 End stage renal disease: Secondary | ICD-10-CM | POA: Diagnosis not present

## 2013-12-29 DIAGNOSIS — D631 Anemia in chronic kidney disease: Secondary | ICD-10-CM | POA: Diagnosis not present

## 2013-12-29 DIAGNOSIS — D509 Iron deficiency anemia, unspecified: Secondary | ICD-10-CM | POA: Diagnosis not present

## 2013-12-29 DIAGNOSIS — N2581 Secondary hyperparathyroidism of renal origin: Secondary | ICD-10-CM | POA: Diagnosis not present

## 2013-12-29 DIAGNOSIS — N186 End stage renal disease: Secondary | ICD-10-CM | POA: Diagnosis not present

## 2014-01-26 DIAGNOSIS — N186 End stage renal disease: Secondary | ICD-10-CM | POA: Diagnosis not present

## 2014-01-28 DIAGNOSIS — N186 End stage renal disease: Secondary | ICD-10-CM | POA: Diagnosis not present

## 2014-01-28 DIAGNOSIS — N2581 Secondary hyperparathyroidism of renal origin: Secondary | ICD-10-CM | POA: Diagnosis not present

## 2014-01-28 DIAGNOSIS — Z23 Encounter for immunization: Secondary | ICD-10-CM | POA: Diagnosis not present

## 2014-01-28 DIAGNOSIS — D509 Iron deficiency anemia, unspecified: Secondary | ICD-10-CM | POA: Diagnosis not present

## 2014-02-21 ENCOUNTER — Other Ambulatory Visit (HOSPITAL_COMMUNITY): Payer: Self-pay | Admitting: Nephrology

## 2014-02-21 ENCOUNTER — Ambulatory Visit (HOSPITAL_COMMUNITY)
Admission: RE | Admit: 2014-02-21 | Discharge: 2014-02-21 | Disposition: A | Discharge status: Home / Self Care | Payer: Medicare Other | Source: Ambulatory Visit | Attending: Critical Care Medicine | Admitting: Critical Care Medicine

## 2014-02-21 DIAGNOSIS — I517 Cardiomegaly: Secondary | ICD-10-CM | POA: Insufficient documentation

## 2014-02-21 DIAGNOSIS — R9431 Abnormal electrocardiogram [ECG] [EKG]: Secondary | ICD-10-CM

## 2014-02-23 ENCOUNTER — Emergency Department (HOSPITAL_COMMUNITY)
Admission: EM | Admit: 2014-02-23 | Discharge: 2014-02-23 | Disposition: A | Discharge status: Home / Self Care | Payer: Medicare Other | Attending: Emergency Medicine | Admitting: Emergency Medicine

## 2014-02-23 ENCOUNTER — Encounter (HOSPITAL_COMMUNITY): Payer: Self-pay | Admitting: Emergency Medicine

## 2014-02-23 ENCOUNTER — Emergency Department (HOSPITAL_COMMUNITY): Payer: Medicare Other

## 2014-02-23 DIAGNOSIS — Z79899 Other long term (current) drug therapy: Secondary | ICD-10-CM | POA: Diagnosis not present

## 2014-02-23 DIAGNOSIS — L299 Pruritus, unspecified: Secondary | ICD-10-CM | POA: Diagnosis not present

## 2014-02-23 DIAGNOSIS — Z862 Personal history of diseases of the blood and blood-forming organs and certain disorders involving the immune mechanism: Secondary | ICD-10-CM | POA: Diagnosis not present

## 2014-02-23 DIAGNOSIS — I10 Essential (primary) hypertension: Secondary | ICD-10-CM | POA: Diagnosis not present

## 2014-02-23 DIAGNOSIS — N186 End stage renal disease: Secondary | ICD-10-CM | POA: Insufficient documentation

## 2014-02-23 DIAGNOSIS — I120 Hypertensive chronic kidney disease with stage 5 chronic kidney disease or end stage renal disease: Secondary | ICD-10-CM | POA: Insufficient documentation

## 2014-02-23 DIAGNOSIS — Z992 Dependence on renal dialysis: Secondary | ICD-10-CM | POA: Insufficient documentation

## 2014-02-23 DIAGNOSIS — R112 Nausea with vomiting, unspecified: Secondary | ICD-10-CM | POA: Insufficient documentation

## 2014-02-23 DIAGNOSIS — R55 Syncope and collapse: Secondary | ICD-10-CM | POA: Insufficient documentation

## 2014-02-23 LAB — URINALYSIS, ROUTINE W REFLEX MICROSCOPIC
Bilirubin Urine: NEGATIVE
Glucose, UA: NEGATIVE mg/dL
Ketones, ur: NEGATIVE mg/dL
Leukocytes, UA: NEGATIVE
Nitrite: NEGATIVE
Protein, ur: >300 mg/dL — AB
Specific Gravity, Urine: 1.019 (ref 1.005–1.030)
Urobilinogen, UA: 0.2 mg/dL (ref 0.0–1.0)
pH: 6 (ref 5.0–8.0)

## 2014-02-23 LAB — COMPREHENSIVE METABOLIC PANEL
ALT: 10 U/L (ref 0–53)
AST: 11 U/L (ref 0–37)
Albumin: 5 g/dL (ref 3.5–5.2)
Alkaline Phosphatase: 59 U/L (ref 39–117)
BUN: 20 mg/dL (ref 6–23)
CO2: 30 mEq/L (ref 19–32)
Calcium: 10.6 mg/dL — ABNORMAL HIGH (ref 8.4–10.5)
Chloride: 90 mEq/L — ABNORMAL LOW (ref 96–112)
Creatinine, Ser: 5.47 mg/dL — ABNORMAL HIGH (ref 0.50–1.35)
GFR calc Af Amer: 15 mL/min — ABNORMAL LOW (ref >90)
GFR calc non Af Amer: 13 mL/min — ABNORMAL LOW (ref >90)
Glucose, Bld: 151 mg/dL — ABNORMAL HIGH (ref 70–99)
Potassium: 4.2 mEq/L (ref 3.7–5.3)
Sodium: 137 mEq/L (ref 137–147)
Total Bilirubin: 0.6 mg/dL (ref 0.3–1.2)
Total Protein: 9.8 g/dL — ABNORMAL HIGH (ref 6.0–8.3)

## 2014-02-23 LAB — CBC
HCT: 40.7 % (ref 39.0–52.0)
Hemoglobin: 13.4 g/dL (ref 13.0–17.0)
MCH: 29.2 pg (ref 26.0–34.0)
MCHC: 32.9 g/dL (ref 30.0–36.0)
MCV: 88.7 fL (ref 78.0–100.0)
Platelets: 175 10*3/uL (ref 150–400)
RBC: 4.59 MIL/uL (ref 4.22–5.81)
RDW: 14.4 % (ref 11.5–15.5)
WBC: 10.5 10*3/uL (ref 4.0–10.5)

## 2014-02-23 LAB — CBG MONITORING, ED: Glucose-Capillary: 79 mg/dL (ref 70–99)

## 2014-02-23 LAB — URINE MICROSCOPIC-ADD ON

## 2014-02-23 MED ORDER — SODIUM CHLORIDE 0.9 % IV BOLUS (SEPSIS)
250.0000 mL | Freq: Once | INTRAVENOUS | Status: AC
  Administered 2014-02-23: 250 mL via INTRAVENOUS

## 2014-02-23 NOTE — ED Notes (Signed)
Patient came in through triage. Patient was found at the bus stop with Altered Mental States, Diaphoresis, and feeling nauseous. Patient was brought in to the ED. Upon arrival, Patient vomited once and is diaphoretic. Patient reports leaving Dialysis and trying to take the bus home.

## 2014-02-23 NOTE — ED Notes (Signed)
Dr. Aline Brochure made aware that the patient wants to leave. Patient made aware that MD will be in soon. Agreed to stay and wait to see the MD.

## 2014-02-23 NOTE — ED Notes (Signed)
Phlebotomy at the bedside  

## 2014-02-23 NOTE — ED Notes (Signed)
Dr. Harrison at the bedside. 

## 2014-02-23 NOTE — ED Provider Notes (Signed)
CSN: [id number]     Arrival date & time 02/23/14  1323 History   First MD Initiated Contact with Patient 02/23/14 1503     Chief Complaint  Patient presents with  . Altered Mental Status  . Emesis     (Consider location/radiation/quality/duration/timing/severity/associated sxs/prior Treatment) Patient is a 28 y.o. male presenting with vomiting and near-syncope. The history is provided by the patient.  Emesis Associated symptoms: no diarrhea   Near Syncope This is a recurrent problem. The current episode started 1 to 2 hours ago. Episode frequency: once. The problem has been resolved. Pertinent negatives include no chest pain and no shortness of breath. Nothing aggravates the symptoms. Nothing relieves the symptoms. He has tried nothing for the symptoms. The treatment provided significant relief.    Past Medical History  Diagnosis Date  . Anemia   . Nephrotic syndrome   . Hypertension 08/09/2011   Past Surgical History  Procedure Laterality Date  . Renal biopsy    . Av fistula placement Left 07/18/2013    Procedure: ARTERIOVENOUS (AV) FISTULA CREATION ;  Surgeon: Rosetta Posner, MD;  Location: [place];  Service: Vascular;  Laterality: Left;  . Insertion of dialysis catheter Right 07/18/2013    Procedure: INSERTION OF DIALYSIS CATHETER;  Surgeon: Rosetta Posner, MD;  Location: [facility] OR;  Service: Vascular;  Laterality: Right;   Family History  Problem Relation Age of Onset  . Hypertension Mother    History  Substance Use Topics  . Smoking status: Passive Smoke Exposure - Never Smoker    Types: Cigars  . Smokeless tobacco: Never Used  . Alcohol Use: 0.6 oz/week    1 Shots of liquor per week    Review of Systems  Constitutional:       Itching  HENT: Negative for drooling and rhinorrhea.   Eyes: Negative for pain.  Respiratory: Negative for cough and shortness of breath.   Cardiovascular: Positive for near-syncope. Negative for chest pain and leg swelling.  Gastrointestinal:  Positive for nausea and vomiting. Negative for diarrhea.  Genitourinary: Negative for dysuria and hematuria.  Musculoskeletal: Negative for gait problem and neck pain.  Skin: Negative for color change.  Neurological: Negative for numbness.  Hematological: Negative for adenopathy.  Psychiatric/Behavioral: Negative for behavioral problems.  All other systems reviewed and are negative.     Allergies  Review of patient's allergies indicates no known allergies.  Home Medications   Prior to Admission medications   Medication Sig Start Date End Date Taking? Authorizing Provider  amLODipine (NORVASC) 10 MG tablet Take 10 mg by mouth daily. 12/19/13  Yes Historical Provider, MD  calcium acetate (PHOSLO) 667 MG capsule Take 1,334 mg by mouth 3 (three) times daily with meals.  01/26/14  Yes Historical Provider, MD  Multiple Vitamin (MULTIVITAMIN WITH MINERALS) TABS tablet Take 1 tablet by mouth daily.   Yes Historical Provider, MD  phosphorus (K PHOS NEUTRAL) 155-852-130 MG tablet Take by mouth 3 (three) times daily.   Yes Historical Provider, MD   BP 107/81  Pulse 82  Resp 12  Ht 6' (1.829 m)  Wt 150 lb (68.04 kg)  BMI 20.34 kg/m2  SpO2 100% Physical Exam  Nursing note and vitals reviewed. Constitutional: He is oriented to person, place, and time. He appears well-developed and well-nourished.  HENT:  Head: Normocephalic and atraumatic.  Right Ear: External ear normal.  Left Ear: External ear normal.  Nose: Nose normal.  Mouth/Throat: Oropharynx is clear and moist. No oropharyngeal exudate.  Eyes:  Conjunctivae and EOM are normal. Pupils are equal, round, and reactive to light.  Neck: Normal range of motion. Neck supple.  Cardiovascular: Normal rate, regular rhythm, normal heart sounds and intact distal pulses.  Exam reveals no gallop and no friction rub.   No murmur heard. Pulmonary/Chest: Effort normal and breath sounds normal. No respiratory distress. He has no wheezes.  Abdominal:  Soft. Bowel sounds are normal. He exhibits no distension. There is no tenderness. There is no rebound and no guarding.  Musculoskeletal: Normal range of motion. He exhibits no edema and no tenderness.  Neurological: He is alert and oriented to person, place, and time.  alert, oriented x3 speech: normal in context and clarity memory: intact grossly cranial nerves II-XII: intact motor strength: full proximally and distally no involuntary movements or tremors sensation: intact to light touch diffusely  cerebellar: finger-to-nose and heel-to-shin intact gait: normal forwards and backwards   Skin: Skin is warm and dry.  Psychiatric: He has a normal mood and affect. His behavior is normal.    ED Course  Procedures (including critical care time) Labs Review Labs Reviewed  COMPREHENSIVE METABOLIC PANEL - Abnormal; Notable for the following:    Chloride 90 (*)    Glucose, Bld 151 (*)    Creatinine, Ser 5.47 (*)    Calcium 10.6 (*)    Total Protein 9.8 (*)    GFR calc non Af Amer 13 (*)    GFR calc Af Amer 15 (*)    All other components within normal limits  URINALYSIS, ROUTINE W REFLEX MICROSCOPIC - Abnormal; Notable for the following:    APPearance CLOUDY (*)    Hgb urine dipstick SMALL (*)    Protein, ur >300 (*)    All other components within normal limits  URINE MICROSCOPIC-ADD ON - Abnormal; Notable for the following:    Bacteria, UA MANY (*)    Casts GRANULAR CAST (*)    All other components within normal limits  CBC  CBG MONITORING, ED    Imaging Review Dg Chest 2 View  02/23/2014   CLINICAL DATA:  Syncope  EXAM: CHEST  2 VIEW  COMPARISON:  08/02/2013  FINDINGS: Cardiomediastinal silhouette is stable. No acute infiltrate or pleural effusion. No pulmonary edema. Bony thorax is unremarkable.  IMPRESSION: No active cardiopulmonary disease.   Electronically Signed   By: Lahoma Crocker M.D.   On: 02/23/2014 17:05     EKG Interpretation   Date/Time:  Thursday Feb 23 2014  13:37:28 EDT Ventricular Rate:  78 PR Interval:  131 QRS Duration: 124 QT Interval:  422 QTC Calculation: 481 R Axis:   84 Text Interpretation:  Sinus rhythm RAE, consider biatrial enlargement  Probable left ventricular hypertrophy ST elev, probable normal early repol  pattern Borderline prolonged QT interval No significant change since last  tracing Confirmed by Oluwatamilore Starnes  MD, [place] ([id number]) on 02/23/2014 3:41:18 PM      MDM   Final diagnoses:  Near syncope    3:38 PM 28 y.o. male w a hx of ESRD on HD who presents with a syncopal episode which occurred prior to arrival. He states that he dialyzes Tuesday, Thursday, and Saturday. He notes that he has not missed dialysis recently. He states that he dialyzes for approximately 3 and half hours today. While sitting at a bus stop and the son waiting to get the bus he had a near vs syncopal episode. Short prodrome of lightheadedness. He does not believe he fully lost consciousness but slumped over  in the chair. He denies falling down or hitting his head. He started feeling better after several minutes. He notes previous similar episodes after dialysis. He is afebrile and vital signs are unremarkable here. He did have a mildly low blood pressure on arrival which has improved with oral hydration. He has a normal neurologic exam. Screening labwork sent prior to my arrival. Will give him a 250 cc bolus. Pt is a/o x3, he is not altered.   5:27 PM: BP improved w/ po hydration and 250cc bolus. Pt continues to appear well. UA w/ many  Bact, pt denies gu sx, neg leuk and nitrite, doubt UTI.  I have discussed the diagnosis/risks/treatment options with the patient and believe the pt to be eligible for discharge home to follow-up with pcp as needed. We also discussed returning to the ED immediately if new or worsening sx occur. We discussed the sx which are most concerning (e.g., further syncope, sob, cp) that necessitate immediate return. Medications administered  to the patient during their visit and any new prescriptions provided to the patient are listed below.  Medications given during this visit Medications  sodium chloride 0.9 % bolus 250 mL (250 mLs Intravenous New Bag/Given 02/23/14 1646)    New Prescriptions   No medications on file     Blanchard Kelch, MD 02/23/14 1734

## 2014-02-23 NOTE — ED Notes (Signed)
Attempted IV x1. Kuch at the bedside trying to obtain access.

## 2014-02-26 DIAGNOSIS — N186 End stage renal disease: Secondary | ICD-10-CM | POA: Diagnosis not present

## 2014-02-28 DIAGNOSIS — N2581 Secondary hyperparathyroidism of renal origin: Secondary | ICD-10-CM | POA: Diagnosis not present

## 2014-02-28 DIAGNOSIS — D509 Iron deficiency anemia, unspecified: Secondary | ICD-10-CM | POA: Diagnosis not present

## 2014-02-28 DIAGNOSIS — N186 End stage renal disease: Secondary | ICD-10-CM | POA: Diagnosis not present

## 2014-03-06 DIAGNOSIS — N2581 Secondary hyperparathyroidism of renal origin: Secondary | ICD-10-CM | POA: Diagnosis not present

## 2014-03-06 DIAGNOSIS — N186 End stage renal disease: Secondary | ICD-10-CM | POA: Diagnosis not present

## 2014-03-28 DIAGNOSIS — N186 End stage renal disease: Secondary | ICD-10-CM | POA: Diagnosis not present

## 2014-03-29 DIAGNOSIS — D631 Anemia in chronic kidney disease: Secondary | ICD-10-CM | POA: Diagnosis not present

## 2014-03-29 DIAGNOSIS — D509 Iron deficiency anemia, unspecified: Secondary | ICD-10-CM | POA: Diagnosis not present

## 2014-03-29 DIAGNOSIS — N2581 Secondary hyperparathyroidism of renal origin: Secondary | ICD-10-CM | POA: Diagnosis not present

## 2014-03-29 DIAGNOSIS — N186 End stage renal disease: Secondary | ICD-10-CM | POA: Diagnosis not present

## 2014-03-29 DIAGNOSIS — E8779 Other fluid overload: Secondary | ICD-10-CM | POA: Diagnosis not present

## 2014-04-28 DIAGNOSIS — N186 End stage renal disease: Secondary | ICD-10-CM | POA: Diagnosis not present

## 2014-04-29 DIAGNOSIS — D631 Anemia in chronic kidney disease: Secondary | ICD-10-CM | POA: Diagnosis not present

## 2014-04-29 DIAGNOSIS — N039 Chronic nephritic syndrome with unspecified morphologic changes: Secondary | ICD-10-CM | POA: Diagnosis not present

## 2014-04-29 DIAGNOSIS — D509 Iron deficiency anemia, unspecified: Secondary | ICD-10-CM | POA: Diagnosis not present

## 2014-04-29 DIAGNOSIS — N186 End stage renal disease: Secondary | ICD-10-CM | POA: Diagnosis not present

## 2014-04-29 DIAGNOSIS — N2581 Secondary hyperparathyroidism of renal origin: Secondary | ICD-10-CM | POA: Diagnosis not present

## 2014-05-15 DIAGNOSIS — Z01810 Encounter for preprocedural cardiovascular examination: Secondary | ICD-10-CM | POA: Diagnosis not present

## 2014-05-29 DIAGNOSIS — N186 End stage renal disease: Secondary | ICD-10-CM | POA: Diagnosis not present

## 2014-05-30 DIAGNOSIS — N2581 Secondary hyperparathyroidism of renal origin: Secondary | ICD-10-CM | POA: Diagnosis not present

## 2014-05-30 DIAGNOSIS — D509 Iron deficiency anemia, unspecified: Secondary | ICD-10-CM | POA: Diagnosis not present

## 2014-05-30 DIAGNOSIS — N186 End stage renal disease: Secondary | ICD-10-CM | POA: Diagnosis not present

## 2014-05-30 DIAGNOSIS — N039 Chronic nephritic syndrome with unspecified morphologic changes: Secondary | ICD-10-CM | POA: Diagnosis not present

## 2014-05-30 DIAGNOSIS — D631 Anemia in chronic kidney disease: Secondary | ICD-10-CM | POA: Diagnosis not present

## 2014-06-21 ENCOUNTER — Emergency Department (HOSPITAL_COMMUNITY)
Admission: EM | Admit: 2014-06-21 | Discharge: 2014-06-21 | Disposition: A | Discharge status: Home / Self Care | Payer: Medicare Other | Attending: Emergency Medicine | Admitting: Emergency Medicine

## 2014-06-21 DIAGNOSIS — R519 Headache, unspecified: Secondary | ICD-10-CM

## 2014-06-21 DIAGNOSIS — Z862 Personal history of diseases of the blood and blood-forming organs and certain disorders involving the immune mechanism: Secondary | ICD-10-CM | POA: Insufficient documentation

## 2014-06-21 DIAGNOSIS — R51 Headache: Secondary | ICD-10-CM | POA: Diagnosis not present

## 2014-06-21 DIAGNOSIS — I120 Hypertensive chronic kidney disease with stage 5 chronic kidney disease or end stage renal disease: Secondary | ICD-10-CM | POA: Insufficient documentation

## 2014-06-21 DIAGNOSIS — R112 Nausea with vomiting, unspecified: Secondary | ICD-10-CM

## 2014-06-21 DIAGNOSIS — I158 Other secondary hypertension: Secondary | ICD-10-CM | POA: Diagnosis not present

## 2014-06-21 DIAGNOSIS — Z79899 Other long term (current) drug therapy: Secondary | ICD-10-CM | POA: Insufficient documentation

## 2014-06-21 DIAGNOSIS — Z992 Dependence on renal dialysis: Secondary | ICD-10-CM | POA: Diagnosis not present

## 2014-06-21 DIAGNOSIS — N186 End stage renal disease: Secondary | ICD-10-CM | POA: Insufficient documentation

## 2014-06-21 DIAGNOSIS — N2889 Other specified disorders of kidney and ureter: Secondary | ICD-10-CM

## 2014-06-21 DIAGNOSIS — I151 Hypertension secondary to other renal disorders: Secondary | ICD-10-CM

## 2014-06-21 LAB — URINALYSIS, ROUTINE W REFLEX MICROSCOPIC
Bilirubin Urine: NEGATIVE
Glucose, UA: 100 mg/dL — AB
Ketones, ur: NEGATIVE mg/dL
Leukocytes, UA: NEGATIVE
Nitrite: NEGATIVE
Protein, ur: >300 mg/dL — AB
Specific Gravity, Urine: 1.014 (ref 1.005–1.030)
Urobilinogen, UA: 0.2 mg/dL (ref 0.0–1.0)
pH: 7 (ref 5.0–8.0)

## 2014-06-21 LAB — COMPREHENSIVE METABOLIC PANEL
ALT: 12 U/L (ref 0–53)
AST: 12 U/L (ref 0–37)
Albumin: 5.1 g/dL (ref 3.5–5.2)
Alkaline Phosphatase: 49 U/L (ref 39–117)
Anion gap: 22 — ABNORMAL HIGH (ref 5–15)
BUN: 60 mg/dL — ABNORMAL HIGH (ref 6–23)
CO2: 25 mEq/L (ref 19–32)
Calcium: 11.3 mg/dL — ABNORMAL HIGH (ref 8.4–10.5)
Chloride: 92 mEq/L — ABNORMAL LOW (ref 96–112)
Creatinine, Ser: 10.53 mg/dL — ABNORMAL HIGH (ref 0.50–1.35)
GFR calc Af Amer: 7 mL/min — ABNORMAL LOW (ref >90)
GFR calc non Af Amer: 6 mL/min — ABNORMAL LOW (ref >90)
Glucose, Bld: 73 mg/dL (ref 70–99)
Potassium: 4 mEq/L (ref 3.7–5.3)
Sodium: 139 mEq/L (ref 137–147)
Total Bilirubin: 0.5 mg/dL (ref 0.3–1.2)
Total Protein: 9.6 g/dL — ABNORMAL HIGH (ref 6.0–8.3)

## 2014-06-21 LAB — CBC WITH DIFFERENTIAL/PLATELET
Basophils Absolute: 0 10*3/uL (ref 0.0–0.1)
Basophils Relative: 0 % (ref 0–1)
Eosinophils Absolute: 0 10*3/uL (ref 0.0–0.7)
Eosinophils Relative: 0 % (ref 0–5)
HCT: 42.2 % (ref 39.0–52.0)
Hemoglobin: 13.6 g/dL (ref 13.0–17.0)
Lymphocytes Relative: 10 % — ABNORMAL LOW (ref 12–46)
Lymphs Abs: 0.8 10*3/uL (ref 0.7–4.0)
MCH: 30.3 pg (ref 26.0–34.0)
MCHC: 32.2 g/dL (ref 30.0–36.0)
MCV: 94 fL (ref 78.0–100.0)
Monocytes Absolute: 0.3 10*3/uL (ref 0.1–1.0)
Monocytes Relative: 3 % (ref 3–12)
Neutro Abs: 7.3 10*3/uL (ref 1.7–7.7)
Neutrophils Relative %: 87 % — ABNORMAL HIGH (ref 43–77)
Platelets: 235 10*3/uL (ref 150–400)
RBC: 4.49 MIL/uL (ref 4.22–5.81)
RDW: 13.8 % (ref 11.5–15.5)
WBC: 8.5 10*3/uL (ref 4.0–10.5)

## 2014-06-21 LAB — LIPASE, BLOOD: Lipase: 25 U/L (ref 11–59)

## 2014-06-21 LAB — URINE MICROSCOPIC-ADD ON

## 2014-06-21 MED ORDER — METOCLOPRAMIDE HCL 5 MG/ML IJ SOLN
10.0000 mg | Freq: Once | INTRAMUSCULAR | Status: AC
  Administered 2014-06-21: 10 mg via INTRAVENOUS
  Filled 2014-06-21: qty 2

## 2014-06-21 MED ORDER — CLONIDINE HCL 0.1 MG PO TABS
0.1000 mg | ORAL_TABLET | Freq: Once | ORAL | Status: AC
  Administered 2014-06-21: 0.1 mg via ORAL

## 2014-06-21 MED ORDER — SODIUM CHLORIDE 0.9 % IV BOLUS (SEPSIS)
500.0000 mL | Freq: Once | INTRAVENOUS | Status: AC
  Administered 2014-06-21: 500 mL via INTRAVENOUS

## 2014-06-21 MED ORDER — ONDANSETRON HCL 4 MG PO TABS: 4.0000 mg | ORAL_TABLET | Freq: Three times a day (TID) | ORAL | Status: DC | PRN

## 2014-06-21 MED ORDER — DIPHENHYDRAMINE HCL 50 MG/ML IJ SOLN
25.0000 mg | Freq: Once | INTRAMUSCULAR | Status: AC
  Administered 2014-06-21: 25 mg via INTRAVENOUS
  Filled 2014-06-21: qty 1

## 2014-06-21 NOTE — ED Provider Notes (Signed)
CSN: [id number]     Arrival date & time 06/21/14  1503 History   First MD Initiated Contact with Patient 06/21/14 1723     Chief Complaint  Patient presents with  . Emesis     (Consider location/radiation/quality/duration/timing/severity/associated sxs/prior Treatment) HPI Patient has end-stage renal disease and gets dialysis on Tuesday (yesterday), Thursday and Saturdays. He states he goes to [facility]. He states his dialysis went well yesterday. He states this morning he woke up with a throbbing frontal headache and he has had nausea and vomiting somewhere between 5-10 times. He did not see  blood in his vomitus. He states he hasn't eaten since last night. He states he did take some headache  over-the-counter and one of the times he vomited there was black in the vomitus. He states he's had headaches like this before but it's been about 2 years ago. He states he's been vomiting after eating for the past week. He states it happens a few hours after eating. He does not related to a particular type of food. He's unsure of fever and actually gets upset if I ask him if he's had fever. He denies sore throat but does have rhinorrhea and some coughing just prior to vomiting. He denies any abdominal pain, chest pain, or dizziness. He denies being around anybody else who is ill. Patient seems reluctant to answer questions and at the end of his interview asked me what time the cafeteria closes. When I told him we don't normally feed people who are vomiting he states "I didn't ask you to feed me, I asked you to tell me what time the cafeteria closed".  PCP none [facility]  Past Medical History  Diagnosis Date  . Anemia   . Nephrotic syndrome   . Hypertension 08/09/2011   Past Surgical History  Procedure Laterality Date  . Renal biopsy    . Av fistula placement Left 07/18/2013    Procedure: ARTERIOVENOUS (AV) FISTULA CREATION ;  Surgeon: Rosetta Posner, MD;  Location: [place];  Service:  Vascular;  Laterality: Left;  . Insertion of dialysis catheter Right 07/18/2013    Procedure: INSERTION OF DIALYSIS CATHETER;  Surgeon: Rosetta Posner, MD;  Location: [facility] OR;  Service: Vascular;  Laterality: Right;   Family History  Problem Relation Age of Onset  . Hypertension Mother    History  Substance Use Topics  . Smoking status: Passive Smoke Exposure - Never Smoker    Types: Cigars  . Smokeless tobacco: Never Used  . Alcohol Use: 0.6 oz/week    1 Shots of liquor per week    Review of Systems  All other systems reviewed and are negative.     Allergies  Review of patient's allergies indicates no known allergies.  Home Medications   Prior to Admission medications   Medication Sig Start Date End Date Taking? Authorizing Provider  calcium acetate (PHOSLO) 667 MG capsule Take 1,334 mg by mouth 3 (three) times daily with meals.  01/26/14  Yes Historical Provider, MD  phosphorus (K PHOS NEUTRAL) 155-852-130 MG tablet Take by mouth 3 (three) times daily.   Yes Historical Provider, MD  amLODipine (NORVASC) 10 MG tablet Take 10 mg by mouth daily. 12/19/13   Historical Provider, MD  ondansetron (ZOFRAN) 4 MG tablet Take 1 tablet (4 mg total) by mouth every 8 (eight) hours as needed for nausea or vomiting. 06/21/14   Janice Norrie, MD   BP 168/106  Pulse 80  Temp(Src) 97.8 F (  36.6 C) (Oral)  Resp 18  SpO2 100%  Vital signs normal   Physical Exam  Nursing note and vitals reviewed. Constitutional: He is oriented to person, place, and time. He appears well-developed and well-nourished.  Non-toxic appearance. He does not appear ill. No distress.  HENT:  Head: Normocephalic and atraumatic.  Right Ear: External ear normal.  Left Ear: External ear normal.  Nose: Nose normal. No mucosal edema or rhinorrhea.  Mouth/Throat: Oropharynx is clear and moist and mucous membranes are normal. No dental abscesses or uvula swelling.  Eyes: Conjunctivae and EOM are normal. Pupils are equal,  round, and reactive to light.  Neck: Normal range of motion and full passive range of motion without pain. Neck supple.  Cardiovascular: Normal rate, regular rhythm and normal heart sounds.  Exam reveals no gallop and no friction rub.   No murmur heard. Pulmonary/Chest: Effort normal and breath sounds normal. No respiratory distress. He has no wheezes. He has no rhonchi. He has no rales. He exhibits no tenderness and no crepitus.  Abdominal: Soft. Normal appearance and bowel sounds are normal. He exhibits no distension. There is no tenderness. There is no rebound and no guarding.  Musculoskeletal: Normal range of motion. He exhibits no edema and no tenderness.  Moves all extremities well.   Neurological: He is alert and oriented to person, place, and time. He has normal strength. No cranial nerve deficit.  Skin: Skin is warm, dry and intact. No rash noted. No erythema. No pallor.  Psychiatric: He has a normal mood and affect. His speech is normal and behavior is normal. His mood appears not anxious.    ED Course  Procedures (including critical care time)  Medications  sodium chloride 0.9 % bolus 500 mL (0 mLs Intravenous Stopped 06/21/14 2008)  metoCLOPramide (REGLAN) injection 10 mg (10 mg Intravenous Given 06/21/14 1816)  diphenhydrAMINE (BENADRYL) injection 25 mg (25 mg Intravenous Given 06/21/14 1816)  cloNIDine (CATAPRES) tablet 0.1 mg (0.1 mg Oral Given 06/21/14 2054)    When patient was rechecked he was feeling better. He had no further vomiting while in the ED. He states his headache is almost gone. Patient requested to try drinking fluids. He was able to drink fluids without vomiting. Patient's blood pressure was noted to be elevated. He is not taking his Norvasc yet today which he normally takes at night. He was given clonidine 0.1 mg orally and discharged to take his own Norvasc at home.  Labs Review Results for orders placed during the hospital encounter of 06/21/14  CBC WITH  DIFFERENTIAL      Result Value Ref Range   WBC 8.5  4.0 - 10.5 K/uL   RBC 4.49  4.22 - 5.81 MIL/uL   Hemoglobin 13.6  13.0 - 17.0 g/dL   HCT 42.2  39.0 - 52.0 %   MCV 94.0  78.0 - 100.0 fL   MCH 30.3  26.0 - 34.0 pg   MCHC 32.2  30.0 - 36.0 g/dL   RDW 13.8  11.5 - 15.5 %   Platelets 235  150 - 400 K/uL   Neutrophils Relative % 87 (*) 43 - 77 %   Neutro Abs 7.3  1.7 - 7.7 K/uL   Lymphocytes Relative 10 (*) 12 - 46 %   Lymphs Abs 0.8  0.7 - 4.0 K/uL   Monocytes Relative 3  3 - 12 %   Monocytes Absolute 0.3  0.1 - 1.0 K/uL   Eosinophils Relative 0  0 - 5 %  Eosinophils Absolute 0.0  0.0 - 0.7 K/uL   Basophils Relative 0  0 - 1 %   Basophils Absolute 0.0  0.0 - 0.1 K/uL  COMPREHENSIVE METABOLIC PANEL      Result Value Ref Range   Sodium 139  137 - 147 mEq/L   Potassium 4.0  3.7 - 5.3 mEq/L   Chloride 92 (*) 96 - 112 mEq/L   CO2 25  19 - 32 mEq/L   Glucose, Bld 73  70 - 99 mg/dL   BUN 60 (*) 6 - 23 mg/dL   Creatinine, Ser 10.53 (*) 0.50 - 1.35 mg/dL   Calcium 11.3 (*) 8.4 - 10.5 mg/dL   Total Protein 9.6 (*) 6.0 - 8.3 g/dL   Albumin 5.1  3.5 - 5.2 g/dL   AST 12  0 - 37 U/L   ALT 12  0 - 53 U/L   Alkaline Phosphatase 49  39 - 117 U/L   Total Bilirubin 0.5  0.3 - 1.2 mg/dL   GFR calc non Af Amer 6 (*) >90 mL/min   GFR calc Af Amer 7 (*) >90 mL/min   Anion gap 22 (*) 5 - 15  LIPASE, BLOOD      Result Value Ref Range   Lipase 25  11 - 59 U/L  URINALYSIS, ROUTINE W REFLEX MICROSCOPIC      Result Value Ref Range   Color, Urine YELLOW  YELLOW   APPearance CLEAR  CLEAR   Specific Gravity, Urine 1.014  1.005 - 1.030   pH 7.0  5.0 - 8.0   Glucose, UA 100 (*) NEGATIVE mg/dL   Hgb urine dipstick MODERATE (*) NEGATIVE   Bilirubin Urine NEGATIVE  NEGATIVE   Ketones, ur NEGATIVE  NEGATIVE mg/dL   Protein, ur >300 (*) NEGATIVE mg/dL   Urobilinogen, UA 0.2  0.0 - 1.0 mg/dL   Nitrite NEGATIVE  NEGATIVE   Leukocytes, UA NEGATIVE  NEGATIVE  URINE MICROSCOPIC-ADD ON      Result Value  Ref Range   WBC, UA 0-2  <3 WBC/hpf   RBC / HPF 7-10  <3 RBC/hpf   Laboratory interpretation all normal except chronic renal disease, proteinuria consistent with his nephrotic syndrome diagnosis     Imaging Review No results found.   EKG Interpretation None      MDM  patient presents with his typical headache which he's not had for couple years. He also has been having some nausea and vomiting after eating. His headache was improved at time of discharge.    Final diagnoses:  Headache, unspecified headache type  Nausea and vomiting, vomiting of unspecified type  Hypertension secondary to other renal disorders     New Prescriptions   ONDANSETRON (ZOFRAN) 4 MG TABLET    Take 1 tablet (4 mg total) by mouth every 8 (eight) hours as needed for nausea or vomiting.    Rolland Porter, MD, [facility]    Janice Norrie, MD 06/21/14 [phone]

## 2014-06-21 NOTE — ED Notes (Signed)
Knows to follow up with nephrologist. AVS explained in detail. No other questions/concerns.

## 2014-06-21 NOTE — ED Notes (Addendum)
Pt reports hx of HTN, receives dialysis, went to dialysis yesterday. Has not taken BP medications in 3 weeks, took 1 dose last night. Reports vomiting since Monday. Vomited x2 today. Reports headache 7/10.

## 2014-06-21 NOTE — Discharge Instructions (Signed)
Keep your dialysis appointment tomorrow. Use the Zofran for nausea or vomiting. If you continue to have nausea and vomiting that your dialysis physician know so you can get a referral to a gastroenterologist. Return to the ED if you feel worse.

## 2014-06-24 ENCOUNTER — Encounter (HOSPITAL_COMMUNITY): Payer: Self-pay | Admitting: Emergency Medicine

## 2014-06-24 ENCOUNTER — Emergency Department (HOSPITAL_COMMUNITY): Payer: No Typology Code available for payment source

## 2014-06-24 ENCOUNTER — Emergency Department (HOSPITAL_COMMUNITY)
Admission: EM | Admit: 2014-06-24 | Discharge: 2014-06-24 | Disposition: A | Discharge status: Home / Self Care | Payer: No Typology Code available for payment source | Attending: Emergency Medicine | Admitting: Emergency Medicine

## 2014-06-24 DIAGNOSIS — S8990XA Unspecified injury of unspecified lower leg, initial encounter: Secondary | ICD-10-CM | POA: Insufficient documentation

## 2014-06-24 DIAGNOSIS — R51 Headache: Secondary | ICD-10-CM | POA: Insufficient documentation

## 2014-06-24 DIAGNOSIS — Y9389 Activity, other specified: Secondary | ICD-10-CM | POA: Diagnosis not present

## 2014-06-24 DIAGNOSIS — I10 Essential (primary) hypertension: Secondary | ICD-10-CM | POA: Insufficient documentation

## 2014-06-24 DIAGNOSIS — Y92410 Unspecified street and highway as the place of occurrence of the external cause: Secondary | ICD-10-CM | POA: Diagnosis not present

## 2014-06-24 DIAGNOSIS — S99929A Unspecified injury of unspecified foot, initial encounter: Principal | ICD-10-CM

## 2014-06-24 DIAGNOSIS — Z87448 Personal history of other diseases of urinary system: Secondary | ICD-10-CM | POA: Insufficient documentation

## 2014-06-24 DIAGNOSIS — M25569 Pain in unspecified knee: Secondary | ICD-10-CM | POA: Diagnosis not present

## 2014-06-24 DIAGNOSIS — Z862 Personal history of diseases of the blood and blood-forming organs and certain disorders involving the immune mechanism: Secondary | ICD-10-CM | POA: Insufficient documentation

## 2014-06-24 DIAGNOSIS — Z79899 Other long term (current) drug therapy: Secondary | ICD-10-CM | POA: Insufficient documentation

## 2014-06-24 DIAGNOSIS — S99919A Unspecified injury of unspecified ankle, initial encounter: Secondary | ICD-10-CM | POA: Diagnosis not present

## 2014-06-24 MED ORDER — TRAMADOL HCL 50 MG PO TABS: 50.0000 mg | ORAL_TABLET | Freq: Four times a day (QID) | ORAL | Status: DC | PRN

## 2014-06-24 MED ORDER — METHOCARBAMOL 500 MG PO TABS: 500.0000 mg | ORAL_TABLET | Freq: Two times a day (BID) | ORAL | Status: DC
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
## 2014-06-24 NOTE — ED Provider Notes (Signed)
Medical screening examination/treatment/procedure(s) were performed by non-physician practitioner and as supervising physician I was immediately available for consultation/collaboration.   EKG Interpretation None       Virgel Manifold, MD 06/24/14 [phone]
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
## 2014-06-28 DIAGNOSIS — N186 End stage renal disease: Secondary | ICD-10-CM | POA: Diagnosis not present

## 2014-06-29 DIAGNOSIS — N2581 Secondary hyperparathyroidism of renal origin: Secondary | ICD-10-CM | POA: Diagnosis not present

## 2014-06-29 DIAGNOSIS — D509 Iron deficiency anemia, unspecified: Secondary | ICD-10-CM | POA: Diagnosis not present

## 2014-06-29 DIAGNOSIS — N186 End stage renal disease: Secondary | ICD-10-CM | POA: Diagnosis not present

## 2014-06-29 DIAGNOSIS — D631 Anemia in chronic kidney disease: Secondary | ICD-10-CM | POA: Diagnosis not present

## 2014-06-29 DIAGNOSIS — Z23 Encounter for immunization: Secondary | ICD-10-CM | POA: Diagnosis not present

## 2014-07-29 DIAGNOSIS — N186 End stage renal disease: Secondary | ICD-10-CM | POA: Diagnosis not present

## 2014-07-29 DIAGNOSIS — Z992 Dependence on renal dialysis: Secondary | ICD-10-CM | POA: Diagnosis not present

## 2014-08-01 DIAGNOSIS — D509 Iron deficiency anemia, unspecified: Secondary | ICD-10-CM | POA: Diagnosis not present

## 2014-08-01 DIAGNOSIS — N186 End stage renal disease: Secondary | ICD-10-CM | POA: Diagnosis not present

## 2014-08-01 DIAGNOSIS — D631 Anemia in chronic kidney disease: Secondary | ICD-10-CM | POA: Diagnosis not present

## 2014-08-01 DIAGNOSIS — N2581 Secondary hyperparathyroidism of renal origin: Secondary | ICD-10-CM | POA: Diagnosis not present

## 2014-08-28 DIAGNOSIS — Z992 Dependence on renal dialysis: Secondary | ICD-10-CM | POA: Diagnosis not present

## 2014-08-28 DIAGNOSIS — N186 End stage renal disease: Secondary | ICD-10-CM | POA: Diagnosis not present

## 2014-08-29 DIAGNOSIS — N2581 Secondary hyperparathyroidism of renal origin: Secondary | ICD-10-CM | POA: Diagnosis not present

## 2014-08-29 DIAGNOSIS — D631 Anemia in chronic kidney disease: Secondary | ICD-10-CM | POA: Diagnosis not present

## 2014-08-29 DIAGNOSIS — D509 Iron deficiency anemia, unspecified: Secondary | ICD-10-CM | POA: Diagnosis not present

## 2014-08-29 DIAGNOSIS — N186 End stage renal disease: Secondary | ICD-10-CM | POA: Diagnosis not present

## 2014-09-28 DIAGNOSIS — Z992 Dependence on renal dialysis: Secondary | ICD-10-CM | POA: Diagnosis not present

## 2014-09-28 DIAGNOSIS — N186 End stage renal disease: Secondary | ICD-10-CM | POA: Diagnosis not present

## 2014-09-30 DIAGNOSIS — R7309 Other abnormal glucose: Secondary | ICD-10-CM | POA: Diagnosis not present

## 2014-09-30 DIAGNOSIS — N2581 Secondary hyperparathyroidism of renal origin: Secondary | ICD-10-CM | POA: Diagnosis not present

## 2014-09-30 DIAGNOSIS — D509 Iron deficiency anemia, unspecified: Secondary | ICD-10-CM | POA: Diagnosis not present

## 2014-09-30 DIAGNOSIS — N186 End stage renal disease: Secondary | ICD-10-CM | POA: Diagnosis not present

## 2014-09-30 DIAGNOSIS — D631 Anemia in chronic kidney disease: Secondary | ICD-10-CM | POA: Diagnosis not present

## 2014-10-03 DIAGNOSIS — D631 Anemia in chronic kidney disease: Secondary | ICD-10-CM | POA: Diagnosis not present

## 2014-10-03 DIAGNOSIS — D509 Iron deficiency anemia, unspecified: Secondary | ICD-10-CM | POA: Diagnosis not present

## 2014-10-03 DIAGNOSIS — N186 End stage renal disease: Secondary | ICD-10-CM | POA: Diagnosis not present

## 2014-10-03 DIAGNOSIS — N2581 Secondary hyperparathyroidism of renal origin: Secondary | ICD-10-CM | POA: Diagnosis not present

## 2014-10-03 DIAGNOSIS — R7309 Other abnormal glucose: Secondary | ICD-10-CM | POA: Diagnosis not present

## 2014-10-05 DIAGNOSIS — R7309 Other abnormal glucose: Secondary | ICD-10-CM | POA: Diagnosis not present

## 2014-10-05 DIAGNOSIS — D509 Iron deficiency anemia, unspecified: Secondary | ICD-10-CM | POA: Diagnosis not present

## 2014-10-05 DIAGNOSIS — D631 Anemia in chronic kidney disease: Secondary | ICD-10-CM | POA: Diagnosis not present

## 2014-10-05 DIAGNOSIS — N186 End stage renal disease: Secondary | ICD-10-CM | POA: Diagnosis not present

## 2014-10-05 DIAGNOSIS — N2581 Secondary hyperparathyroidism of renal origin: Secondary | ICD-10-CM | POA: Diagnosis not present

## 2014-10-07 DIAGNOSIS — R7309 Other abnormal glucose: Secondary | ICD-10-CM | POA: Diagnosis not present

## 2014-10-07 DIAGNOSIS — D509 Iron deficiency anemia, unspecified: Secondary | ICD-10-CM | POA: Diagnosis not present

## 2014-10-07 DIAGNOSIS — D631 Anemia in chronic kidney disease: Secondary | ICD-10-CM | POA: Diagnosis not present

## 2014-10-07 DIAGNOSIS — N2581 Secondary hyperparathyroidism of renal origin: Secondary | ICD-10-CM | POA: Diagnosis not present

## 2014-10-07 DIAGNOSIS — N186 End stage renal disease: Secondary | ICD-10-CM | POA: Diagnosis not present

## 2014-10-10 DIAGNOSIS — D509 Iron deficiency anemia, unspecified: Secondary | ICD-10-CM | POA: Diagnosis not present

## 2014-10-10 DIAGNOSIS — D631 Anemia in chronic kidney disease: Secondary | ICD-10-CM | POA: Diagnosis not present

## 2014-10-10 DIAGNOSIS — N2581 Secondary hyperparathyroidism of renal origin: Secondary | ICD-10-CM | POA: Diagnosis not present

## 2014-10-10 DIAGNOSIS — N186 End stage renal disease: Secondary | ICD-10-CM | POA: Diagnosis not present

## 2014-10-10 DIAGNOSIS — R7309 Other abnormal glucose: Secondary | ICD-10-CM | POA: Diagnosis not present

## 2014-10-12 DIAGNOSIS — N2581 Secondary hyperparathyroidism of renal origin: Secondary | ICD-10-CM | POA: Diagnosis not present

## 2014-10-12 DIAGNOSIS — N186 End stage renal disease: Secondary | ICD-10-CM | POA: Diagnosis not present

## 2014-10-12 DIAGNOSIS — D631 Anemia in chronic kidney disease: Secondary | ICD-10-CM | POA: Diagnosis not present

## 2014-10-12 DIAGNOSIS — R7309 Other abnormal glucose: Secondary | ICD-10-CM | POA: Diagnosis not present

## 2014-10-12 DIAGNOSIS — D509 Iron deficiency anemia, unspecified: Secondary | ICD-10-CM | POA: Diagnosis not present

## 2014-10-13 ENCOUNTER — Other Ambulatory Visit: Payer: Self-pay | Admitting: Nephrology

## 2014-10-13 ENCOUNTER — Encounter (HOSPITAL_COMMUNITY): Payer: Self-pay

## 2014-10-13 ENCOUNTER — Emergency Department (INDEPENDENT_AMBULATORY_CARE_PROVIDER_SITE_OTHER)
Admission: EM | Admit: 2014-10-13 | Discharge: 2014-10-13 | Disposition: A | Discharge status: Home / Self Care | Payer: Medicare Other | Source: Home / Self Care | Attending: Family Medicine | Admitting: Family Medicine

## 2014-10-13 DIAGNOSIS — IMO0002 Reserved for concepts with insufficient information to code with codable children: Secondary | ICD-10-CM

## 2014-10-13 DIAGNOSIS — N6310 Unspecified lump in the right breast, unspecified quadrant: Secondary | ICD-10-CM

## 2014-10-13 DIAGNOSIS — N63 Unspecified lump in breast: Secondary | ICD-10-CM | POA: Diagnosis not present

## 2014-10-13 DIAGNOSIS — N6489 Other specified disorders of breast: Secondary | ICD-10-CM

## 2014-10-13 NOTE — ED Notes (Signed)
Bed: UC08 Expected date:  Expected time:  Means of arrival:  Comments: hold

## 2014-10-13 NOTE — ED Provider Notes (Signed)
CSN: [id number]     Arrival date & time 10/13/14  1132 History   First MD Initiated Contact with Patient 10/13/14 1212     Chief Complaint  Patient presents with  . Skin Problem   (Consider location/radiation/quality/duration/timing/severity/associated sxs/prior Treatment) HPI Comments: Patient presents for evaluation of right breast mass that he noticed about two weeks ago. Has not noticed any issue with left breast. Denies previous episodes. Denies skin changes or nipple discharge. Mass is nontender. States he mentioned this issue to his doctor and he was told to seek evaluation at urgent care.  PCP: Dr. Jimmy Footman (patient with ESRD x 1 year). Receives hemodialysis 3 x week.   The history is provided by the patient.    Past Medical History  Diagnosis Date  . Anemia   . Nephrotic syndrome   . Hypertension 08/09/2011   Past Surgical History  Procedure Laterality Date  . Renal biopsy    . Av fistula placement Left 07/18/2013    Procedure: ARTERIOVENOUS (AV) FISTULA CREATION ;  Surgeon: Rosetta Posner, MD;  Location: [facility];  Service: Vascular;  Laterality: Left;  . Insertion of dialysis catheter Right 07/18/2013    Procedure: INSERTION OF DIALYSIS CATHETER;  Surgeon: Rosetta Posner, MD;  Location: [facility] OR;  Service: Vascular;  Laterality: Right;   Family History  Problem Relation Age of Onset  . Hypertension Mother    History  Substance Use Topics  . Smoking status: Passive Smoke Exposure - Never Smoker    Types: Cigars  . Smokeless tobacco: Never Used  . Alcohol Use: 0.6 oz/week    1 Shots of liquor per week    Review of Systems  All other systems reviewed and are negative.   Allergies  Review of patient's allergies indicates no known allergies.  Home Medications   Prior to Admission medications   Medication Sig Start Date End Date Taking? Authorizing Provider  amLODipine (NORVASC) 10 MG tablet Take 10 mg by mouth daily. 12/19/13   Historical Provider, MD  calcium  acetate (PHOSLO) 667 MG capsule Take 1,334 mg by mouth 3 (three) times daily with meals.  01/26/14   Historical Provider, MD  methocarbamol (ROBAXIN) 500 MG tablet Take 1 tablet (500 mg total) by mouth 2 (two) times daily. 06/24/14   Heather Laisure, PA-C  ondansetron (ZOFRAN) 4 MG tablet Take 1 tablet (4 mg total) by mouth every 8 (eight) hours as needed for nausea or vomiting. 06/21/14   Janice Norrie, MD  phosphorus (K PHOS NEUTRAL) [id number] MG tablet Take by mouth 3 (three) times daily.    Historical Provider, MD  traMADol (ULTRAM) 50 MG tablet Take 1 tablet (50 mg total) by mouth every 6 (six) hours as needed. 06/24/14   Heather Laisure, PA-C   BP 118/78 mmHg  Pulse 70  Temp(Src) 97.2 F (36.2 C) (Oral)  Resp 16  SpO2 98% Physical Exam  Constitutional: He is oriented to person, place, and time. He appears well-developed and well-nourished. No distress.  HENT:  Head: Normocephalic and atraumatic.  Eyes: Conjunctivae are normal.  Cardiovascular: Normal rate, regular rhythm and normal heart sounds.   Pulmonary/Chest: Effort normal and breath sounds normal. Right breast exhibits mass. Right breast exhibits no inverted nipple, no nipple discharge, no skin change and no tenderness. Left breast exhibits no inverted nipple, no mass, no nipple discharge, no skin change and no tenderness. Breasts are symmetrical.    No axillary lymphadenopathy  Musculoskeletal: Normal range of motion.  Neurological: He is  alert and oriented to person, place, and time.  Skin: Skin is warm and dry. No rash noted. No erythema.  Psychiatric: He has a normal mood and affect. His behavior is normal.  Nursing note and vitals reviewed.   ED Course  Procedures (including critical care time) Labs Review Labs Reviewed - No data to display  Imaging Review No results found.   MDM   1. Breast mass, right     [place] and request for breast ultrasound sent by fax to facility as  requested. Imaging center stated they would contact patient to schedule study once order received.     Lutricia Feil, [place] 10/13/14 1251

## 2014-10-13 NOTE — ED Notes (Signed)
Concerned about lump in nipple x 2 weeks

## 2014-10-13 NOTE — Discharge Instructions (Signed)
I have contacted the [facility] and faxed them a request to perform an ultrasound of your right breast. This facility will contact your to schedule your appointment. If you have not heard from facility in next 1-2 days, please contact to schedule.

## 2014-10-14 DIAGNOSIS — D631 Anemia in chronic kidney disease: Secondary | ICD-10-CM | POA: Diagnosis not present

## 2014-10-14 DIAGNOSIS — N2581 Secondary hyperparathyroidism of renal origin: Secondary | ICD-10-CM | POA: Diagnosis not present

## 2014-10-14 DIAGNOSIS — N186 End stage renal disease: Secondary | ICD-10-CM | POA: Diagnosis not present

## 2014-10-14 DIAGNOSIS — D509 Iron deficiency anemia, unspecified: Secondary | ICD-10-CM | POA: Diagnosis not present

## 2014-10-14 DIAGNOSIS — R7309 Other abnormal glucose: Secondary | ICD-10-CM | POA: Diagnosis not present

## 2014-10-16 ENCOUNTER — Ambulatory Visit
Admission: RE | Admit: 2014-10-16 | Discharge: 2014-10-16 | Disposition: A | Discharge status: Home / Self Care | Payer: Medicare Other | Source: Ambulatory Visit | Attending: Nephrology | Admitting: Nephrology

## 2014-10-16 DIAGNOSIS — N6489 Other specified disorders of breast: Secondary | ICD-10-CM

## 2014-10-16 DIAGNOSIS — IMO0002 Reserved for concepts with insufficient information to code with codable children: Secondary | ICD-10-CM

## 2014-10-16 DIAGNOSIS — N62 Hypertrophy of breast: Secondary | ICD-10-CM | POA: Diagnosis not present

## 2014-10-17 DIAGNOSIS — D509 Iron deficiency anemia, unspecified: Secondary | ICD-10-CM | POA: Diagnosis not present

## 2014-10-17 DIAGNOSIS — D631 Anemia in chronic kidney disease: Secondary | ICD-10-CM | POA: Diagnosis not present

## 2014-10-17 DIAGNOSIS — R7309 Other abnormal glucose: Secondary | ICD-10-CM | POA: Diagnosis not present

## 2014-10-17 DIAGNOSIS — N2581 Secondary hyperparathyroidism of renal origin: Secondary | ICD-10-CM | POA: Diagnosis not present

## 2014-10-17 DIAGNOSIS — N186 End stage renal disease: Secondary | ICD-10-CM | POA: Diagnosis not present

## 2014-10-19 DIAGNOSIS — R7309 Other abnormal glucose: Secondary | ICD-10-CM | POA: Diagnosis not present

## 2014-10-19 DIAGNOSIS — D631 Anemia in chronic kidney disease: Secondary | ICD-10-CM | POA: Diagnosis not present

## 2014-10-19 DIAGNOSIS — N186 End stage renal disease: Secondary | ICD-10-CM | POA: Diagnosis not present

## 2014-10-19 DIAGNOSIS — D509 Iron deficiency anemia, unspecified: Secondary | ICD-10-CM | POA: Diagnosis not present

## 2014-10-19 DIAGNOSIS — N2581 Secondary hyperparathyroidism of renal origin: Secondary | ICD-10-CM | POA: Diagnosis not present

## 2014-10-23 DIAGNOSIS — N2581 Secondary hyperparathyroidism of renal origin: Secondary | ICD-10-CM | POA: Diagnosis not present

## 2014-10-23 DIAGNOSIS — N186 End stage renal disease: Secondary | ICD-10-CM | POA: Diagnosis not present

## 2014-10-23 DIAGNOSIS — R7309 Other abnormal glucose: Secondary | ICD-10-CM | POA: Diagnosis not present

## 2014-10-23 DIAGNOSIS — D631 Anemia in chronic kidney disease: Secondary | ICD-10-CM | POA: Diagnosis not present

## 2014-10-23 DIAGNOSIS — D509 Iron deficiency anemia, unspecified: Secondary | ICD-10-CM | POA: Diagnosis not present

## 2014-10-26 DIAGNOSIS — N186 End stage renal disease: Secondary | ICD-10-CM | POA: Diagnosis not present

## 2014-10-26 DIAGNOSIS — D509 Iron deficiency anemia, unspecified: Secondary | ICD-10-CM | POA: Diagnosis not present

## 2014-10-26 DIAGNOSIS — R7309 Other abnormal glucose: Secondary | ICD-10-CM | POA: Diagnosis not present

## 2014-10-26 DIAGNOSIS — D631 Anemia in chronic kidney disease: Secondary | ICD-10-CM | POA: Diagnosis not present

## 2014-10-26 DIAGNOSIS — N2581 Secondary hyperparathyroidism of renal origin: Secondary | ICD-10-CM | POA: Diagnosis not present

## 2014-10-28 DIAGNOSIS — R7309 Other abnormal glucose: Secondary | ICD-10-CM | POA: Diagnosis not present

## 2014-10-28 DIAGNOSIS — N186 End stage renal disease: Secondary | ICD-10-CM | POA: Diagnosis not present

## 2014-10-28 DIAGNOSIS — D631 Anemia in chronic kidney disease: Secondary | ICD-10-CM | POA: Diagnosis not present

## 2014-10-28 DIAGNOSIS — N2581 Secondary hyperparathyroidism of renal origin: Secondary | ICD-10-CM | POA: Diagnosis not present

## 2014-10-28 DIAGNOSIS — D509 Iron deficiency anemia, unspecified: Secondary | ICD-10-CM | POA: Diagnosis not present

## 2014-10-29 DIAGNOSIS — N186 End stage renal disease: Secondary | ICD-10-CM | POA: Diagnosis not present

## 2014-10-29 DIAGNOSIS — Z992 Dependence on renal dialysis: Secondary | ICD-10-CM | POA: Diagnosis not present

## 2014-10-31 DIAGNOSIS — Z23 Encounter for immunization: Secondary | ICD-10-CM | POA: Diagnosis not present

## 2014-10-31 DIAGNOSIS — D509 Iron deficiency anemia, unspecified: Secondary | ICD-10-CM | POA: Diagnosis not present

## 2014-10-31 DIAGNOSIS — N186 End stage renal disease: Secondary | ICD-10-CM | POA: Diagnosis not present

## 2014-10-31 DIAGNOSIS — D631 Anemia in chronic kidney disease: Secondary | ICD-10-CM | POA: Diagnosis not present

## 2014-10-31 DIAGNOSIS — N2581 Secondary hyperparathyroidism of renal origin: Secondary | ICD-10-CM | POA: Diagnosis not present

## 2014-11-02 DIAGNOSIS — D631 Anemia in chronic kidney disease: Secondary | ICD-10-CM | POA: Diagnosis not present

## 2014-11-02 DIAGNOSIS — Z23 Encounter for immunization: Secondary | ICD-10-CM | POA: Diagnosis not present

## 2014-11-02 DIAGNOSIS — D509 Iron deficiency anemia, unspecified: Secondary | ICD-10-CM | POA: Diagnosis not present

## 2014-11-02 DIAGNOSIS — N2581 Secondary hyperparathyroidism of renal origin: Secondary | ICD-10-CM | POA: Diagnosis not present

## 2014-11-02 DIAGNOSIS — N186 End stage renal disease: Secondary | ICD-10-CM | POA: Diagnosis not present

## 2014-11-04 DIAGNOSIS — Z23 Encounter for immunization: Secondary | ICD-10-CM | POA: Diagnosis not present

## 2014-11-04 DIAGNOSIS — D509 Iron deficiency anemia, unspecified: Secondary | ICD-10-CM | POA: Diagnosis not present

## 2014-11-04 DIAGNOSIS — N2581 Secondary hyperparathyroidism of renal origin: Secondary | ICD-10-CM | POA: Diagnosis not present

## 2014-11-04 DIAGNOSIS — N186 End stage renal disease: Secondary | ICD-10-CM | POA: Diagnosis not present

## 2014-11-04 DIAGNOSIS — D631 Anemia in chronic kidney disease: Secondary | ICD-10-CM | POA: Diagnosis not present

## 2014-11-07 DIAGNOSIS — Z23 Encounter for immunization: Secondary | ICD-10-CM | POA: Diagnosis not present

## 2014-11-07 DIAGNOSIS — D631 Anemia in chronic kidney disease: Secondary | ICD-10-CM | POA: Diagnosis not present

## 2014-11-07 DIAGNOSIS — N186 End stage renal disease: Secondary | ICD-10-CM | POA: Diagnosis not present

## 2014-11-07 DIAGNOSIS — N2581 Secondary hyperparathyroidism of renal origin: Secondary | ICD-10-CM | POA: Diagnosis not present

## 2014-11-07 DIAGNOSIS — D509 Iron deficiency anemia, unspecified: Secondary | ICD-10-CM | POA: Diagnosis not present

## 2014-11-09 DIAGNOSIS — N186 End stage renal disease: Secondary | ICD-10-CM | POA: Diagnosis not present

## 2014-11-09 DIAGNOSIS — D509 Iron deficiency anemia, unspecified: Secondary | ICD-10-CM | POA: Diagnosis not present

## 2014-11-09 DIAGNOSIS — D631 Anemia in chronic kidney disease: Secondary | ICD-10-CM | POA: Diagnosis not present

## 2014-11-09 DIAGNOSIS — N2581 Secondary hyperparathyroidism of renal origin: Secondary | ICD-10-CM | POA: Diagnosis not present

## 2014-11-09 DIAGNOSIS — Z23 Encounter for immunization: Secondary | ICD-10-CM | POA: Diagnosis not present

## 2014-11-11 DIAGNOSIS — Z23 Encounter for immunization: Secondary | ICD-10-CM | POA: Diagnosis not present

## 2014-11-11 DIAGNOSIS — N186 End stage renal disease: Secondary | ICD-10-CM | POA: Diagnosis not present

## 2014-11-11 DIAGNOSIS — D631 Anemia in chronic kidney disease: Secondary | ICD-10-CM | POA: Diagnosis not present

## 2014-11-11 DIAGNOSIS — D509 Iron deficiency anemia, unspecified: Secondary | ICD-10-CM | POA: Diagnosis not present

## 2014-11-11 DIAGNOSIS — N2581 Secondary hyperparathyroidism of renal origin: Secondary | ICD-10-CM | POA: Diagnosis not present

## 2014-11-14 DIAGNOSIS — N186 End stage renal disease: Secondary | ICD-10-CM | POA: Diagnosis not present

## 2014-11-14 DIAGNOSIS — D631 Anemia in chronic kidney disease: Secondary | ICD-10-CM | POA: Diagnosis not present

## 2014-11-14 DIAGNOSIS — Z23 Encounter for immunization: Secondary | ICD-10-CM | POA: Diagnosis not present

## 2014-11-14 DIAGNOSIS — D509 Iron deficiency anemia, unspecified: Secondary | ICD-10-CM | POA: Diagnosis not present

## 2014-11-14 DIAGNOSIS — N2581 Secondary hyperparathyroidism of renal origin: Secondary | ICD-10-CM | POA: Diagnosis not present

## 2014-11-16 DIAGNOSIS — D631 Anemia in chronic kidney disease: Secondary | ICD-10-CM | POA: Diagnosis not present

## 2014-11-16 DIAGNOSIS — N2581 Secondary hyperparathyroidism of renal origin: Secondary | ICD-10-CM | POA: Diagnosis not present

## 2014-11-16 DIAGNOSIS — Z23 Encounter for immunization: Secondary | ICD-10-CM | POA: Diagnosis not present

## 2014-11-16 DIAGNOSIS — N186 End stage renal disease: Secondary | ICD-10-CM | POA: Diagnosis not present

## 2014-11-16 DIAGNOSIS — D509 Iron deficiency anemia, unspecified: Secondary | ICD-10-CM | POA: Diagnosis not present

## 2014-11-21 DIAGNOSIS — N2581 Secondary hyperparathyroidism of renal origin: Secondary | ICD-10-CM | POA: Diagnosis not present

## 2014-11-21 DIAGNOSIS — N186 End stage renal disease: Secondary | ICD-10-CM | POA: Diagnosis not present

## 2014-11-21 DIAGNOSIS — D631 Anemia in chronic kidney disease: Secondary | ICD-10-CM | POA: Diagnosis not present

## 2014-11-21 DIAGNOSIS — Z23 Encounter for immunization: Secondary | ICD-10-CM | POA: Diagnosis not present

## 2014-11-21 DIAGNOSIS — D509 Iron deficiency anemia, unspecified: Secondary | ICD-10-CM | POA: Diagnosis not present

## 2014-11-23 DIAGNOSIS — N2581 Secondary hyperparathyroidism of renal origin: Secondary | ICD-10-CM | POA: Diagnosis not present

## 2014-11-23 DIAGNOSIS — Z23 Encounter for immunization: Secondary | ICD-10-CM | POA: Diagnosis not present

## 2014-11-23 DIAGNOSIS — D509 Iron deficiency anemia, unspecified: Secondary | ICD-10-CM | POA: Diagnosis not present

## 2014-11-23 DIAGNOSIS — D631 Anemia in chronic kidney disease: Secondary | ICD-10-CM | POA: Diagnosis not present

## 2014-11-23 DIAGNOSIS — N186 End stage renal disease: Secondary | ICD-10-CM | POA: Diagnosis not present

## 2014-11-24 DIAGNOSIS — Z23 Encounter for immunization: Secondary | ICD-10-CM | POA: Diagnosis not present

## 2014-11-24 DIAGNOSIS — D509 Iron deficiency anemia, unspecified: Secondary | ICD-10-CM | POA: Diagnosis not present

## 2014-11-24 DIAGNOSIS — D631 Anemia in chronic kidney disease: Secondary | ICD-10-CM | POA: Diagnosis not present

## 2014-11-24 DIAGNOSIS — N186 End stage renal disease: Secondary | ICD-10-CM | POA: Diagnosis not present

## 2014-11-24 DIAGNOSIS — N2581 Secondary hyperparathyroidism of renal origin: Secondary | ICD-10-CM | POA: Diagnosis not present

## 2014-11-27 DIAGNOSIS — N186 End stage renal disease: Secondary | ICD-10-CM | POA: Diagnosis not present

## 2014-11-27 DIAGNOSIS — Z992 Dependence on renal dialysis: Secondary | ICD-10-CM | POA: Diagnosis not present

## 2014-11-28 DIAGNOSIS — N186 End stage renal disease: Secondary | ICD-10-CM | POA: Diagnosis not present

## 2014-11-28 DIAGNOSIS — D509 Iron deficiency anemia, unspecified: Secondary | ICD-10-CM | POA: Diagnosis not present

## 2014-11-28 DIAGNOSIS — D631 Anemia in chronic kidney disease: Secondary | ICD-10-CM | POA: Diagnosis not present

## 2014-11-28 DIAGNOSIS — N2581 Secondary hyperparathyroidism of renal origin: Secondary | ICD-10-CM | POA: Diagnosis not present

## 2014-11-30 DIAGNOSIS — N2581 Secondary hyperparathyroidism of renal origin: Secondary | ICD-10-CM | POA: Diagnosis not present

## 2014-11-30 DIAGNOSIS — D631 Anemia in chronic kidney disease: Secondary | ICD-10-CM | POA: Diagnosis not present

## 2014-11-30 DIAGNOSIS — N186 End stage renal disease: Secondary | ICD-10-CM | POA: Diagnosis not present

## 2014-11-30 DIAGNOSIS — D509 Iron deficiency anemia, unspecified: Secondary | ICD-10-CM | POA: Diagnosis not present

## 2014-12-02 DIAGNOSIS — N2581 Secondary hyperparathyroidism of renal origin: Secondary | ICD-10-CM | POA: Diagnosis not present

## 2014-12-02 DIAGNOSIS — D509 Iron deficiency anemia, unspecified: Secondary | ICD-10-CM | POA: Diagnosis not present

## 2014-12-02 DIAGNOSIS — D631 Anemia in chronic kidney disease: Secondary | ICD-10-CM | POA: Diagnosis not present

## 2014-12-02 DIAGNOSIS — N186 End stage renal disease: Secondary | ICD-10-CM | POA: Diagnosis not present

## 2014-12-05 DIAGNOSIS — D631 Anemia in chronic kidney disease: Secondary | ICD-10-CM | POA: Diagnosis not present

## 2014-12-05 DIAGNOSIS — D509 Iron deficiency anemia, unspecified: Secondary | ICD-10-CM | POA: Diagnosis not present

## 2014-12-05 DIAGNOSIS — N186 End stage renal disease: Secondary | ICD-10-CM | POA: Diagnosis not present

## 2014-12-05 DIAGNOSIS — N2581 Secondary hyperparathyroidism of renal origin: Secondary | ICD-10-CM | POA: Diagnosis not present

## 2014-12-07 DIAGNOSIS — D631 Anemia in chronic kidney disease: Secondary | ICD-10-CM | POA: Diagnosis not present

## 2014-12-07 DIAGNOSIS — D509 Iron deficiency anemia, unspecified: Secondary | ICD-10-CM | POA: Diagnosis not present

## 2014-12-07 DIAGNOSIS — N186 End stage renal disease: Secondary | ICD-10-CM | POA: Diagnosis not present

## 2014-12-07 DIAGNOSIS — N2581 Secondary hyperparathyroidism of renal origin: Secondary | ICD-10-CM | POA: Diagnosis not present

## 2014-12-09 DIAGNOSIS — N186 End stage renal disease: Secondary | ICD-10-CM | POA: Diagnosis not present

## 2014-12-09 DIAGNOSIS — D631 Anemia in chronic kidney disease: Secondary | ICD-10-CM | POA: Diagnosis not present

## 2014-12-09 DIAGNOSIS — D509 Iron deficiency anemia, unspecified: Secondary | ICD-10-CM | POA: Diagnosis not present

## 2014-12-09 DIAGNOSIS — N2581 Secondary hyperparathyroidism of renal origin: Secondary | ICD-10-CM | POA: Diagnosis not present

## 2014-12-12 DIAGNOSIS — N2581 Secondary hyperparathyroidism of renal origin: Secondary | ICD-10-CM | POA: Diagnosis not present

## 2014-12-12 DIAGNOSIS — D509 Iron deficiency anemia, unspecified: Secondary | ICD-10-CM | POA: Diagnosis not present

## 2014-12-12 DIAGNOSIS — N186 End stage renal disease: Secondary | ICD-10-CM | POA: Diagnosis not present

## 2014-12-12 DIAGNOSIS — D631 Anemia in chronic kidney disease: Secondary | ICD-10-CM | POA: Diagnosis not present

## 2014-12-14 DIAGNOSIS — D509 Iron deficiency anemia, unspecified: Secondary | ICD-10-CM | POA: Diagnosis not present

## 2014-12-14 DIAGNOSIS — N186 End stage renal disease: Secondary | ICD-10-CM | POA: Diagnosis not present

## 2014-12-14 DIAGNOSIS — D631 Anemia in chronic kidney disease: Secondary | ICD-10-CM | POA: Diagnosis not present

## 2014-12-14 DIAGNOSIS — N2581 Secondary hyperparathyroidism of renal origin: Secondary | ICD-10-CM | POA: Diagnosis not present

## 2014-12-16 ENCOUNTER — Encounter (HOSPITAL_COMMUNITY): Payer: Self-pay | Admitting: Emergency Medicine

## 2014-12-16 ENCOUNTER — Emergency Department (HOSPITAL_COMMUNITY)
Admission: EM | Admit: 2014-12-16 | Discharge: 2014-12-16 | Disposition: A | Discharge status: Home / Self Care | Payer: Medicare Other | Attending: Emergency Medicine | Admitting: Emergency Medicine

## 2014-12-16 DIAGNOSIS — Z452 Encounter for adjustment and management of vascular access device: Secondary | ICD-10-CM | POA: Diagnosis present

## 2014-12-16 DIAGNOSIS — Z79899 Other long term (current) drug therapy: Secondary | ICD-10-CM | POA: Insufficient documentation

## 2014-12-16 DIAGNOSIS — I120 Hypertensive chronic kidney disease with stage 5 chronic kidney disease or end stage renal disease: Secondary | ICD-10-CM | POA: Diagnosis not present

## 2014-12-16 DIAGNOSIS — N186 End stage renal disease: Secondary | ICD-10-CM | POA: Diagnosis not present

## 2014-12-16 DIAGNOSIS — Z862 Personal history of diseases of the blood and blood-forming organs and certain disorders involving the immune mechanism: Secondary | ICD-10-CM | POA: Diagnosis not present

## 2014-12-16 DIAGNOSIS — Z992 Dependence on renal dialysis: Secondary | ICD-10-CM | POA: Insufficient documentation

## 2014-12-16 LAB — COMPREHENSIVE METABOLIC PANEL
ALT: 9 U/L (ref 0–53)
AST: 10 U/L (ref 0–37)
Albumin: 4.1 g/dL (ref 3.5–5.2)
Alkaline Phosphatase: 47 U/L (ref 39–117)
Anion gap: 17 — ABNORMAL HIGH (ref 5–15)
BUN: 57 mg/dL — ABNORMAL HIGH (ref 6–23)
CO2: 26 mmol/L (ref 19–32)
Calcium: 9.9 mg/dL (ref 8.4–10.5)
Chloride: 95 mmol/L — ABNORMAL LOW (ref 96–112)
Creatinine, Ser: 11.73 mg/dL — ABNORMAL HIGH (ref 0.50–1.35)
GFR calc Af Amer: 6 mL/min — ABNORMAL LOW (ref >90)
GFR calc non Af Amer: 5 mL/min — ABNORMAL LOW (ref >90)
Glucose, Bld: 103 mg/dL — ABNORMAL HIGH (ref 70–99)
Potassium: 4.3 mmol/L (ref 3.5–5.1)
Sodium: 138 mmol/L (ref 135–145)
Total Bilirubin: 0.6 mg/dL (ref 0.3–1.2)
Total Protein: 7.5 g/dL (ref 6.0–8.3)

## 2014-12-16 NOTE — ED Notes (Signed)
Lab at the bedside 

## 2014-12-16 NOTE — ED Notes (Signed)
When in to meet and discharge patient after shift change, patient not in room. Gown and blood pressure cuff on the bed. Patient left without papers, but MD spoke with patient about discharge. Patient had no prescription and had normal lab results.

## 2014-12-16 NOTE — ED Notes (Signed)
Pt. Stated, I just need dialysis , its just too much going on.
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
## 2014-12-16 NOTE — ED Provider Notes (Signed)
CSN: [id number]     Arrival date & time 12/16/14  0856 History   First MD Initiated Contact with Patient 12/16/14 [phone]     Chief Complaint  Patient presents with  . Vascular Access Problem    needs dialysis     (Consider location/radiation/quality/duration/timing/severity/associated sxs/prior Treatment) HPI Comments: Patient with a history of Nephrotic Syndrome, HTN, and ESRD on dialysis presents today requesting dialysis.  He states that he gets dialysis every Tuesday, Thursday, and Saturday.  Last dialysis was two days ago on Thursday.  He states that he showed up today at the dialysis center for his regularly scheduled dialysis.  However, he was an hour late and he reports that the staff was rude to him.  He, therefore, left dialysis and instead came to the ED.  He denies edema, SOB, nausea, vomiting, fever, chills, or any other symptoms at this time.  Nephrologist is Dr. Lorrene Reid with [facility].  The history is provided by the patient.    Past Medical History  Diagnosis Date  . Anemia   . Nephrotic syndrome   . Hypertension 08/09/2011   Past Surgical History  Procedure Laterality Date  . Renal biopsy    . Av fistula placement Left 07/18/2013    Procedure: ARTERIOVENOUS (AV) FISTULA CREATION ;  Surgeon: Rosetta Posner, MD;  Location: [facility];  Service: Vascular;  Laterality: Left;  . Insertion of dialysis catheter Right 07/18/2013    Procedure: INSERTION OF DIALYSIS CATHETER;  Surgeon: Rosetta Posner, MD;  Location: [facility] OR;  Service: Vascular;  Laterality: Right;   Family History  Problem Relation Age of Onset  . Hypertension Mother    History  Substance Use Topics  . Smoking status: Passive Smoke Exposure - Never Smoker    Types: Cigars  . Smokeless tobacco: Never Used  . Alcohol Use: 0.6 oz/week    1 Shots of liquor per week    Review of Systems  All other systems reviewed and are negative.     Allergies  Review of patient's allergies indicates no known  allergies.  Home Medications   Prior to Admission medications   Medication Sig Start Date End Date Taking? Authorizing Provider  amLODipine (NORVASC) 10 MG tablet Take 10 mg by mouth daily. 12/19/13   Historical Provider, MD  calcium acetate (PHOSLO) 667 MG capsule Take 1,334 mg by mouth 3 (three) times daily with meals.  01/26/14   Historical Provider, MD  methocarbamol (ROBAXIN) 500 MG tablet Take 1 tablet (500 mg total) by mouth 2 (two) times daily. 06/24/14   Shawntelle Ungar, PA-C  ondansetron (ZOFRAN) 4 MG tablet Take 1 tablet (4 mg total) by mouth every 8 (eight) hours as needed for nausea or vomiting. 06/21/14   Rolland Porter, MD  phosphorus (K PHOS NEUTRAL) [id number] MG tablet Take by mouth 3 (three) times daily.    Historical Provider, MD  traMADol (ULTRAM) 50 MG tablet Take 1 tablet (50 mg total) by mouth every 6 (six) hours as needed. 06/24/14   Aaro Meyers, PA-C   BP 113/82 mmHg  Pulse 83  Temp(Src) 98.1 F (36.7 C) (Oral)  Resp 17  Ht 6' (1.829 m)  Wt 171 lb 15.3 oz (78 kg)  BMI 23.32 kg/m2  SpO2 100% Physical Exam  Constitutional: He appears well-developed and well-nourished.  HENT:  Head: Normocephalic and atraumatic.  Mouth/Throat: Oropharynx is clear and moist.  Neck: Normal range of motion. Neck supple.  Cardiovascular: Normal rate, regular rhythm and normal heart sounds.  Pulmonary/Chest: Effort normal and breath sounds normal. No respiratory distress. He has no wheezes. He has no rales.  Musculoskeletal: Normal range of motion.  No LE edema  Neurological: He is alert.  Skin: Skin is warm and dry.  Psychiatric: He has a normal mood and affect.  Nursing note and vitals reviewed.   ED Course  Procedures (including critical care time) Labs Review Labs Reviewed  COMPREHENSIVE METABOLIC PANEL    Imaging Review No results found.   EKG Interpretation None      MDM   Final diagnoses:  None   Patient with a history of ESRD currently on dialysis  presents today requesting dialysis.  Last dialysis was 2 days ago.  He reports that he showed up at the Dialysis Center today for his regularly scheduled dialysis, but was an hour late and he states that the staff was rude to him.  He is currently asymptomatic.  BMP is WNL including Potassium.  Therefore, do not feel that emergent dialysis is needed at this time.  Patient stable for discharge.  Patient instructed to call kidney center to reschedule dialysis.  Return precautions given.  Patient discussed with Dr. Johnney Killian who is in agreement with the pain.    Hyman Bible, PA-C 12/16/14 1537  Charlesetta Shanks, MD 12/16/14 [phone]

## 2014-12-16 NOTE — ED Notes (Signed)
Heather, PA at the bedside.

## 2014-12-16 NOTE — Discharge Instructions (Signed)
Return to the Emergency Department if you develop swelling of your legs, shortness of breath, nausea, vomiting, or any other symptoms that are concerning to you.  I  End-Stage Kidney Disease The kidneys are two organs that lie on either side of the spine between the middle of the back and the front of the abdomen. The kidneys:   Remove wastes and extra water from the blood.   Produce important hormones. These help keep bones strong, regulate blood pressure, and help create red blood cells.   Balance the fluids and chemicals in the blood and tissues. End-stage kidney disease occurs when the kidneys are so damaged that they cannot do their job. When the kidneys cannot do their job, life-threatening problems occur. The body cannot stay clean and strong without the help of the kidneys. In end-stage kidney disease, the kidneys cannot get better.You need a new kidney or treatments to do some of the work healthy kidneys do in order to stay alive. CAUSES  End-stage kidney disease usually occurs when a long-lasting (chronic) kidney disease gets worse. It may also occur after the kidneys are suddenly damaged (acute kidney injury).  SYMPTOMS   Swelling (edema) of the legs, ankles, or feet.   Tiredness (lethargy).   Nausea or vomiting.   Confusion.   Problems with urination, such as:   Decreased urine production.   Frequent urination, especially at night.   Frequent accidents in children who are potty trained.   Muscle twitches and cramps.   Persistent itchiness.   Loss of appetite.   Headaches.   Abnormally dark or light skin.   Numbness in the hands or feet.   Easy bruising.   Frequent hiccups.   Menstruation stops. DIAGNOSIS  Your health care provider will measure your blood pressure and take some tests. These may include:   Urine tests.   Blood tests.   Imaging tests, such as:   An ultrasound exam.   Computed tomography (CT).  A kidney  biopsy. TREATMENT  There are two treatments for end-stage kidney disease:   A procedure that removes toxic wastes from the body (dialysis).   Receiving a new kidney (kidney transplant). Both of these treatments have serious risks and consequences. Your health care provider will help you determine which treatment is best for you based on your health, age, and other factors. In addition to having dialysis or a kidney transplant, you may need to take medicines to control high blood pressure (hypertension) and cholesterol and to decrease phosphorus levels in your blood.  HOME CARE INSTRUCTIONS  Follow your prescribed diet.   Take medicines only as directed by your health care provider.   Do not take any new medicines (prescription, over-the-counter, or nutritional supplements) unless approved by your health care provider. Many medicines can worsen your kidney damage or need to have the dose adjusted.   Keep all follow-up visits as directed by your health care provider. MAKE SURE YOU:  Understand these instructions.  Will watch your condition.  Will get help right away if you are not doing well or get worse. Document Released: 12/06/2003 Document Revised: 01/30/2014 Document Reviewed: 05/14/2012 [facility] Patient Information 2015 [place], [place]. This information is not intended to replace advice given to you by your health care provider. Make sure you discuss any questions you have with your health care provider.

## 2014-12-19 DIAGNOSIS — N2581 Secondary hyperparathyroidism of renal origin: Secondary | ICD-10-CM | POA: Diagnosis not present

## 2014-12-19 DIAGNOSIS — D631 Anemia in chronic kidney disease: Secondary | ICD-10-CM | POA: Diagnosis not present

## 2014-12-19 DIAGNOSIS — N186 End stage renal disease: Secondary | ICD-10-CM | POA: Diagnosis not present

## 2014-12-19 DIAGNOSIS — D509 Iron deficiency anemia, unspecified: Secondary | ICD-10-CM | POA: Diagnosis not present

## 2014-12-21 DIAGNOSIS — N186 End stage renal disease: Secondary | ICD-10-CM | POA: Diagnosis not present

## 2014-12-21 DIAGNOSIS — N2581 Secondary hyperparathyroidism of renal origin: Secondary | ICD-10-CM | POA: Diagnosis not present

## 2014-12-21 DIAGNOSIS — D509 Iron deficiency anemia, unspecified: Secondary | ICD-10-CM | POA: Diagnosis not present

## 2014-12-21 DIAGNOSIS — D631 Anemia in chronic kidney disease: Secondary | ICD-10-CM | POA: Diagnosis not present

## 2014-12-23 DIAGNOSIS — D631 Anemia in chronic kidney disease: Secondary | ICD-10-CM | POA: Diagnosis not present

## 2014-12-23 DIAGNOSIS — D509 Iron deficiency anemia, unspecified: Secondary | ICD-10-CM | POA: Diagnosis not present

## 2014-12-23 DIAGNOSIS — N186 End stage renal disease: Secondary | ICD-10-CM | POA: Diagnosis not present

## 2014-12-23 DIAGNOSIS — N2581 Secondary hyperparathyroidism of renal origin: Secondary | ICD-10-CM | POA: Diagnosis not present

## 2014-12-26 DIAGNOSIS — N2581 Secondary hyperparathyroidism of renal origin: Secondary | ICD-10-CM | POA: Diagnosis not present

## 2014-12-26 DIAGNOSIS — D509 Iron deficiency anemia, unspecified: Secondary | ICD-10-CM | POA: Diagnosis not present

## 2014-12-26 DIAGNOSIS — D631 Anemia in chronic kidney disease: Secondary | ICD-10-CM | POA: Diagnosis not present

## 2014-12-26 DIAGNOSIS — N186 End stage renal disease: Secondary | ICD-10-CM | POA: Diagnosis not present

## 2014-12-27 DIAGNOSIS — D509 Iron deficiency anemia, unspecified: Secondary | ICD-10-CM | POA: Diagnosis not present

## 2014-12-27 DIAGNOSIS — N186 End stage renal disease: Secondary | ICD-10-CM | POA: Diagnosis not present

## 2014-12-27 DIAGNOSIS — D631 Anemia in chronic kidney disease: Secondary | ICD-10-CM | POA: Diagnosis not present

## 2014-12-27 DIAGNOSIS — N2581 Secondary hyperparathyroidism of renal origin: Secondary | ICD-10-CM | POA: Diagnosis not present

## 2014-12-28 DIAGNOSIS — Z992 Dependence on renal dialysis: Secondary | ICD-10-CM | POA: Diagnosis not present

## 2014-12-28 DIAGNOSIS — N186 End stage renal disease: Secondary | ICD-10-CM | POA: Diagnosis not present

## 2015-01-04 DIAGNOSIS — N2581 Secondary hyperparathyroidism of renal origin: Secondary | ICD-10-CM | POA: Diagnosis not present

## 2015-01-04 DIAGNOSIS — D509 Iron deficiency anemia, unspecified: Secondary | ICD-10-CM | POA: Diagnosis not present

## 2015-01-04 DIAGNOSIS — D631 Anemia in chronic kidney disease: Secondary | ICD-10-CM | POA: Diagnosis not present

## 2015-01-04 DIAGNOSIS — N186 End stage renal disease: Secondary | ICD-10-CM | POA: Diagnosis not present

## 2015-01-06 DIAGNOSIS — D509 Iron deficiency anemia, unspecified: Secondary | ICD-10-CM | POA: Diagnosis not present

## 2015-01-06 DIAGNOSIS — N186 End stage renal disease: Secondary | ICD-10-CM | POA: Diagnosis not present

## 2015-01-06 DIAGNOSIS — D631 Anemia in chronic kidney disease: Secondary | ICD-10-CM | POA: Diagnosis not present

## 2015-01-06 DIAGNOSIS — N2581 Secondary hyperparathyroidism of renal origin: Secondary | ICD-10-CM | POA: Diagnosis not present

## 2015-01-09 DIAGNOSIS — N186 End stage renal disease: Secondary | ICD-10-CM | POA: Diagnosis not present

## 2015-01-09 DIAGNOSIS — D509 Iron deficiency anemia, unspecified: Secondary | ICD-10-CM | POA: Diagnosis not present

## 2015-01-09 DIAGNOSIS — N2581 Secondary hyperparathyroidism of renal origin: Secondary | ICD-10-CM | POA: Diagnosis not present

## 2015-01-09 DIAGNOSIS — D631 Anemia in chronic kidney disease: Secondary | ICD-10-CM | POA: Diagnosis not present

## 2015-01-11 DIAGNOSIS — D509 Iron deficiency anemia, unspecified: Secondary | ICD-10-CM | POA: Diagnosis not present

## 2015-01-11 DIAGNOSIS — N186 End stage renal disease: Secondary | ICD-10-CM | POA: Diagnosis not present

## 2015-01-11 DIAGNOSIS — N2581 Secondary hyperparathyroidism of renal origin: Secondary | ICD-10-CM | POA: Diagnosis not present

## 2015-01-11 DIAGNOSIS — D631 Anemia in chronic kidney disease: Secondary | ICD-10-CM | POA: Diagnosis not present

## 2015-01-13 DIAGNOSIS — D509 Iron deficiency anemia, unspecified: Secondary | ICD-10-CM | POA: Diagnosis not present

## 2015-01-13 DIAGNOSIS — D631 Anemia in chronic kidney disease: Secondary | ICD-10-CM | POA: Diagnosis not present

## 2015-01-13 DIAGNOSIS — N2581 Secondary hyperparathyroidism of renal origin: Secondary | ICD-10-CM | POA: Diagnosis not present

## 2015-01-13 DIAGNOSIS — N186 End stage renal disease: Secondary | ICD-10-CM | POA: Diagnosis not present

## 2015-01-23 DIAGNOSIS — N2581 Secondary hyperparathyroidism of renal origin: Secondary | ICD-10-CM | POA: Diagnosis not present

## 2015-01-23 DIAGNOSIS — D509 Iron deficiency anemia, unspecified: Secondary | ICD-10-CM | POA: Diagnosis not present

## 2015-01-23 DIAGNOSIS — D631 Anemia in chronic kidney disease: Secondary | ICD-10-CM | POA: Diagnosis not present

## 2015-01-23 DIAGNOSIS — N186 End stage renal disease: Secondary | ICD-10-CM | POA: Diagnosis not present

## 2015-01-25 DIAGNOSIS — N186 End stage renal disease: Secondary | ICD-10-CM | POA: Diagnosis not present

## 2015-01-25 DIAGNOSIS — D509 Iron deficiency anemia, unspecified: Secondary | ICD-10-CM | POA: Diagnosis not present

## 2015-01-25 DIAGNOSIS — D631 Anemia in chronic kidney disease: Secondary | ICD-10-CM | POA: Diagnosis not present

## 2015-01-25 DIAGNOSIS — N2581 Secondary hyperparathyroidism of renal origin: Secondary | ICD-10-CM | POA: Diagnosis not present

## 2015-01-27 DIAGNOSIS — D631 Anemia in chronic kidney disease: Secondary | ICD-10-CM | POA: Diagnosis not present

## 2015-01-27 DIAGNOSIS — Z992 Dependence on renal dialysis: Secondary | ICD-10-CM | POA: Diagnosis not present

## 2015-01-27 DIAGNOSIS — D509 Iron deficiency anemia, unspecified: Secondary | ICD-10-CM | POA: Diagnosis not present

## 2015-01-27 DIAGNOSIS — N033 Chronic nephritic syndrome with diffuse mesangial proliferative glomerulonephritis: Secondary | ICD-10-CM | POA: Diagnosis not present

## 2015-01-27 DIAGNOSIS — N2581 Secondary hyperparathyroidism of renal origin: Secondary | ICD-10-CM | POA: Diagnosis not present

## 2015-01-27 DIAGNOSIS — N186 End stage renal disease: Secondary | ICD-10-CM | POA: Diagnosis not present

## 2015-01-30 DIAGNOSIS — D509 Iron deficiency anemia, unspecified: Secondary | ICD-10-CM | POA: Diagnosis not present

## 2015-01-30 DIAGNOSIS — N186 End stage renal disease: Secondary | ICD-10-CM | POA: Diagnosis not present

## 2015-01-30 DIAGNOSIS — N2581 Secondary hyperparathyroidism of renal origin: Secondary | ICD-10-CM | POA: Diagnosis not present

## 2015-01-30 DIAGNOSIS — D631 Anemia in chronic kidney disease: Secondary | ICD-10-CM | POA: Diagnosis not present

## 2015-02-01 DIAGNOSIS — N186 End stage renal disease: Secondary | ICD-10-CM | POA: Diagnosis not present

## 2015-02-01 DIAGNOSIS — D631 Anemia in chronic kidney disease: Secondary | ICD-10-CM | POA: Diagnosis not present

## 2015-02-01 DIAGNOSIS — D509 Iron deficiency anemia, unspecified: Secondary | ICD-10-CM | POA: Diagnosis not present

## 2015-02-01 DIAGNOSIS — N2581 Secondary hyperparathyroidism of renal origin: Secondary | ICD-10-CM | POA: Diagnosis not present

## 2015-02-03 DIAGNOSIS — N2581 Secondary hyperparathyroidism of renal origin: Secondary | ICD-10-CM | POA: Diagnosis not present

## 2015-02-03 DIAGNOSIS — N186 End stage renal disease: Secondary | ICD-10-CM | POA: Diagnosis not present

## 2015-02-03 DIAGNOSIS — D509 Iron deficiency anemia, unspecified: Secondary | ICD-10-CM | POA: Diagnosis not present

## 2015-02-03 DIAGNOSIS — D631 Anemia in chronic kidney disease: Secondary | ICD-10-CM | POA: Diagnosis not present

## 2015-02-06 DIAGNOSIS — D509 Iron deficiency anemia, unspecified: Secondary | ICD-10-CM | POA: Diagnosis not present

## 2015-02-06 DIAGNOSIS — D631 Anemia in chronic kidney disease: Secondary | ICD-10-CM | POA: Diagnosis not present

## 2015-02-06 DIAGNOSIS — N186 End stage renal disease: Secondary | ICD-10-CM | POA: Diagnosis not present

## 2015-02-06 DIAGNOSIS — N2581 Secondary hyperparathyroidism of renal origin: Secondary | ICD-10-CM | POA: Diagnosis not present

## 2015-02-08 DIAGNOSIS — N186 End stage renal disease: Secondary | ICD-10-CM | POA: Diagnosis not present

## 2015-02-08 DIAGNOSIS — D631 Anemia in chronic kidney disease: Secondary | ICD-10-CM | POA: Diagnosis not present

## 2015-02-08 DIAGNOSIS — N2581 Secondary hyperparathyroidism of renal origin: Secondary | ICD-10-CM | POA: Diagnosis not present

## 2015-02-08 DIAGNOSIS — D509 Iron deficiency anemia, unspecified: Secondary | ICD-10-CM | POA: Diagnosis not present

## 2015-02-10 DIAGNOSIS — D509 Iron deficiency anemia, unspecified: Secondary | ICD-10-CM | POA: Diagnosis not present

## 2015-02-10 DIAGNOSIS — N2581 Secondary hyperparathyroidism of renal origin: Secondary | ICD-10-CM | POA: Diagnosis not present

## 2015-02-10 DIAGNOSIS — N186 End stage renal disease: Secondary | ICD-10-CM | POA: Diagnosis not present

## 2015-02-10 DIAGNOSIS — D631 Anemia in chronic kidney disease: Secondary | ICD-10-CM | POA: Diagnosis not present

## 2015-02-13 DIAGNOSIS — N186 End stage renal disease: Secondary | ICD-10-CM | POA: Diagnosis not present

## 2015-02-13 DIAGNOSIS — D631 Anemia in chronic kidney disease: Secondary | ICD-10-CM | POA: Diagnosis not present

## 2015-02-13 DIAGNOSIS — N2581 Secondary hyperparathyroidism of renal origin: Secondary | ICD-10-CM | POA: Diagnosis not present

## 2015-02-13 DIAGNOSIS — D509 Iron deficiency anemia, unspecified: Secondary | ICD-10-CM | POA: Diagnosis not present

## 2015-02-15 DIAGNOSIS — D509 Iron deficiency anemia, unspecified: Secondary | ICD-10-CM | POA: Diagnosis not present

## 2015-02-15 DIAGNOSIS — D631 Anemia in chronic kidney disease: Secondary | ICD-10-CM | POA: Diagnosis not present

## 2015-02-15 DIAGNOSIS — N186 End stage renal disease: Secondary | ICD-10-CM | POA: Diagnosis not present

## 2015-02-15 DIAGNOSIS — N2581 Secondary hyperparathyroidism of renal origin: Secondary | ICD-10-CM | POA: Diagnosis not present

## 2015-02-17 DIAGNOSIS — N186 End stage renal disease: Secondary | ICD-10-CM | POA: Diagnosis not present

## 2015-02-17 DIAGNOSIS — D509 Iron deficiency anemia, unspecified: Secondary | ICD-10-CM | POA: Diagnosis not present

## 2015-02-17 DIAGNOSIS — N2581 Secondary hyperparathyroidism of renal origin: Secondary | ICD-10-CM | POA: Diagnosis not present

## 2015-02-17 DIAGNOSIS — D631 Anemia in chronic kidney disease: Secondary | ICD-10-CM | POA: Diagnosis not present

## 2015-02-20 DIAGNOSIS — D509 Iron deficiency anemia, unspecified: Secondary | ICD-10-CM | POA: Diagnosis not present

## 2015-02-20 DIAGNOSIS — N186 End stage renal disease: Secondary | ICD-10-CM | POA: Diagnosis not present

## 2015-02-20 DIAGNOSIS — D631 Anemia in chronic kidney disease: Secondary | ICD-10-CM | POA: Diagnosis not present

## 2015-02-20 DIAGNOSIS — N2581 Secondary hyperparathyroidism of renal origin: Secondary | ICD-10-CM | POA: Diagnosis not present

## 2015-02-22 DIAGNOSIS — N2581 Secondary hyperparathyroidism of renal origin: Secondary | ICD-10-CM | POA: Diagnosis not present

## 2015-02-22 DIAGNOSIS — D631 Anemia in chronic kidney disease: Secondary | ICD-10-CM | POA: Diagnosis not present

## 2015-02-22 DIAGNOSIS — N186 End stage renal disease: Secondary | ICD-10-CM | POA: Diagnosis not present

## 2015-02-22 DIAGNOSIS — D509 Iron deficiency anemia, unspecified: Secondary | ICD-10-CM | POA: Diagnosis not present

## 2015-02-24 DIAGNOSIS — D631 Anemia in chronic kidney disease: Secondary | ICD-10-CM | POA: Diagnosis not present

## 2015-02-24 DIAGNOSIS — N2581 Secondary hyperparathyroidism of renal origin: Secondary | ICD-10-CM | POA: Diagnosis not present

## 2015-02-24 DIAGNOSIS — D509 Iron deficiency anemia, unspecified: Secondary | ICD-10-CM | POA: Diagnosis not present

## 2015-02-24 DIAGNOSIS — N186 End stage renal disease: Secondary | ICD-10-CM | POA: Diagnosis not present

## 2015-02-27 DIAGNOSIS — D631 Anemia in chronic kidney disease: Secondary | ICD-10-CM | POA: Diagnosis not present

## 2015-02-27 DIAGNOSIS — N2581 Secondary hyperparathyroidism of renal origin: Secondary | ICD-10-CM | POA: Diagnosis not present

## 2015-02-27 DIAGNOSIS — D509 Iron deficiency anemia, unspecified: Secondary | ICD-10-CM | POA: Diagnosis not present

## 2015-02-27 DIAGNOSIS — Z992 Dependence on renal dialysis: Secondary | ICD-10-CM | POA: Diagnosis not present

## 2015-02-27 DIAGNOSIS — N186 End stage renal disease: Secondary | ICD-10-CM | POA: Diagnosis not present

## 2015-02-27 DIAGNOSIS — N033 Chronic nephritic syndrome with diffuse mesangial proliferative glomerulonephritis: Secondary | ICD-10-CM | POA: Diagnosis not present

## 2015-03-01 DIAGNOSIS — D509 Iron deficiency anemia, unspecified: Secondary | ICD-10-CM | POA: Diagnosis not present

## 2015-03-01 DIAGNOSIS — N2581 Secondary hyperparathyroidism of renal origin: Secondary | ICD-10-CM | POA: Diagnosis not present

## 2015-03-01 DIAGNOSIS — N186 End stage renal disease: Secondary | ICD-10-CM | POA: Diagnosis not present

## 2015-03-07 DIAGNOSIS — D509 Iron deficiency anemia, unspecified: Secondary | ICD-10-CM | POA: Diagnosis not present

## 2015-03-07 DIAGNOSIS — N2581 Secondary hyperparathyroidism of renal origin: Secondary | ICD-10-CM | POA: Diagnosis not present

## 2015-03-07 DIAGNOSIS — N186 End stage renal disease: Secondary | ICD-10-CM | POA: Diagnosis not present

## 2015-03-08 DIAGNOSIS — N2581 Secondary hyperparathyroidism of renal origin: Secondary | ICD-10-CM | POA: Diagnosis not present

## 2015-03-08 DIAGNOSIS — N186 End stage renal disease: Secondary | ICD-10-CM | POA: Diagnosis not present

## 2015-03-08 DIAGNOSIS — D509 Iron deficiency anemia, unspecified: Secondary | ICD-10-CM | POA: Diagnosis not present

## 2015-03-10 DIAGNOSIS — N2581 Secondary hyperparathyroidism of renal origin: Secondary | ICD-10-CM | POA: Diagnosis not present

## 2015-03-10 DIAGNOSIS — D509 Iron deficiency anemia, unspecified: Secondary | ICD-10-CM | POA: Diagnosis not present

## 2015-03-10 DIAGNOSIS — N186 End stage renal disease: Secondary | ICD-10-CM | POA: Diagnosis not present

## 2015-03-13 DIAGNOSIS — D509 Iron deficiency anemia, unspecified: Secondary | ICD-10-CM | POA: Diagnosis not present

## 2015-03-13 DIAGNOSIS — N186 End stage renal disease: Secondary | ICD-10-CM | POA: Diagnosis not present

## 2015-03-13 DIAGNOSIS — N2581 Secondary hyperparathyroidism of renal origin: Secondary | ICD-10-CM | POA: Diagnosis not present

## 2015-03-15 DIAGNOSIS — N186 End stage renal disease: Secondary | ICD-10-CM | POA: Diagnosis not present

## 2015-03-15 DIAGNOSIS — N2581 Secondary hyperparathyroidism of renal origin: Secondary | ICD-10-CM | POA: Diagnosis not present

## 2015-03-15 DIAGNOSIS — D509 Iron deficiency anemia, unspecified: Secondary | ICD-10-CM | POA: Diagnosis not present

## 2015-03-17 DIAGNOSIS — N2581 Secondary hyperparathyroidism of renal origin: Secondary | ICD-10-CM | POA: Diagnosis not present

## 2015-03-17 DIAGNOSIS — D509 Iron deficiency anemia, unspecified: Secondary | ICD-10-CM | POA: Diagnosis not present

## 2015-03-17 DIAGNOSIS — N186 End stage renal disease: Secondary | ICD-10-CM | POA: Diagnosis not present

## 2015-03-20 DIAGNOSIS — N186 End stage renal disease: Secondary | ICD-10-CM | POA: Diagnosis not present

## 2015-03-20 DIAGNOSIS — N2581 Secondary hyperparathyroidism of renal origin: Secondary | ICD-10-CM | POA: Diagnosis not present

## 2015-03-20 DIAGNOSIS — D509 Iron deficiency anemia, unspecified: Secondary | ICD-10-CM | POA: Diagnosis not present

## 2015-03-22 DIAGNOSIS — D509 Iron deficiency anemia, unspecified: Secondary | ICD-10-CM | POA: Diagnosis not present

## 2015-03-22 DIAGNOSIS — N2581 Secondary hyperparathyroidism of renal origin: Secondary | ICD-10-CM | POA: Diagnosis not present

## 2015-03-22 DIAGNOSIS — N186 End stage renal disease: Secondary | ICD-10-CM | POA: Diagnosis not present

## 2015-03-24 DIAGNOSIS — N186 End stage renal disease: Secondary | ICD-10-CM | POA: Diagnosis not present

## 2015-03-24 DIAGNOSIS — D509 Iron deficiency anemia, unspecified: Secondary | ICD-10-CM | POA: Diagnosis not present

## 2015-03-24 DIAGNOSIS — N2581 Secondary hyperparathyroidism of renal origin: Secondary | ICD-10-CM | POA: Diagnosis not present

## 2015-03-27 DIAGNOSIS — D509 Iron deficiency anemia, unspecified: Secondary | ICD-10-CM | POA: Diagnosis not present

## 2015-03-27 DIAGNOSIS — N186 End stage renal disease: Secondary | ICD-10-CM | POA: Diagnosis not present

## 2015-03-27 DIAGNOSIS — N2581 Secondary hyperparathyroidism of renal origin: Secondary | ICD-10-CM | POA: Diagnosis not present

## 2015-03-29 DIAGNOSIS — N033 Chronic nephritic syndrome with diffuse mesangial proliferative glomerulonephritis: Secondary | ICD-10-CM | POA: Diagnosis not present

## 2015-03-29 DIAGNOSIS — N186 End stage renal disease: Secondary | ICD-10-CM | POA: Diagnosis not present

## 2015-03-29 DIAGNOSIS — Z992 Dependence on renal dialysis: Secondary | ICD-10-CM | POA: Diagnosis not present

## 2015-03-29 DIAGNOSIS — D509 Iron deficiency anemia, unspecified: Secondary | ICD-10-CM | POA: Diagnosis not present

## 2015-03-29 DIAGNOSIS — N2581 Secondary hyperparathyroidism of renal origin: Secondary | ICD-10-CM | POA: Diagnosis not present

## 2015-03-31 DIAGNOSIS — N2581 Secondary hyperparathyroidism of renal origin: Secondary | ICD-10-CM | POA: Diagnosis not present

## 2015-03-31 DIAGNOSIS — N186 End stage renal disease: Secondary | ICD-10-CM | POA: Diagnosis not present

## 2015-03-31 DIAGNOSIS — D631 Anemia in chronic kidney disease: Secondary | ICD-10-CM | POA: Diagnosis not present

## 2015-03-31 DIAGNOSIS — D509 Iron deficiency anemia, unspecified: Secondary | ICD-10-CM | POA: Diagnosis not present

## 2015-04-03 DIAGNOSIS — D631 Anemia in chronic kidney disease: Secondary | ICD-10-CM | POA: Diagnosis not present

## 2015-04-03 DIAGNOSIS — N186 End stage renal disease: Secondary | ICD-10-CM | POA: Diagnosis not present

## 2015-04-03 DIAGNOSIS — D509 Iron deficiency anemia, unspecified: Secondary | ICD-10-CM | POA: Diagnosis not present

## 2015-04-03 DIAGNOSIS — N2581 Secondary hyperparathyroidism of renal origin: Secondary | ICD-10-CM | POA: Diagnosis not present

## 2015-04-04 DIAGNOSIS — N186 End stage renal disease: Secondary | ICD-10-CM | POA: Diagnosis not present

## 2015-04-04 DIAGNOSIS — D509 Iron deficiency anemia, unspecified: Secondary | ICD-10-CM | POA: Diagnosis not present

## 2015-04-04 DIAGNOSIS — N2581 Secondary hyperparathyroidism of renal origin: Secondary | ICD-10-CM | POA: Diagnosis not present

## 2015-04-04 DIAGNOSIS — D631 Anemia in chronic kidney disease: Secondary | ICD-10-CM | POA: Diagnosis not present

## 2015-04-07 DIAGNOSIS — D509 Iron deficiency anemia, unspecified: Secondary | ICD-10-CM | POA: Diagnosis not present

## 2015-04-07 DIAGNOSIS — N186 End stage renal disease: Secondary | ICD-10-CM | POA: Diagnosis not present

## 2015-04-07 DIAGNOSIS — D631 Anemia in chronic kidney disease: Secondary | ICD-10-CM | POA: Diagnosis not present

## 2015-04-07 DIAGNOSIS — N2581 Secondary hyperparathyroidism of renal origin: Secondary | ICD-10-CM | POA: Diagnosis not present

## 2015-04-12 DIAGNOSIS — N186 End stage renal disease: Secondary | ICD-10-CM | POA: Diagnosis not present

## 2015-04-12 DIAGNOSIS — D509 Iron deficiency anemia, unspecified: Secondary | ICD-10-CM | POA: Diagnosis not present

## 2015-04-12 DIAGNOSIS — D631 Anemia in chronic kidney disease: Secondary | ICD-10-CM | POA: Diagnosis not present

## 2015-04-12 DIAGNOSIS — N2581 Secondary hyperparathyroidism of renal origin: Secondary | ICD-10-CM | POA: Diagnosis not present

## 2015-04-14 DIAGNOSIS — N186 End stage renal disease: Secondary | ICD-10-CM | POA: Diagnosis not present

## 2015-04-14 DIAGNOSIS — D509 Iron deficiency anemia, unspecified: Secondary | ICD-10-CM | POA: Diagnosis not present

## 2015-04-14 DIAGNOSIS — D631 Anemia in chronic kidney disease: Secondary | ICD-10-CM | POA: Diagnosis not present

## 2015-04-14 DIAGNOSIS — N2581 Secondary hyperparathyroidism of renal origin: Secondary | ICD-10-CM | POA: Diagnosis not present

## 2015-04-17 DIAGNOSIS — N186 End stage renal disease: Secondary | ICD-10-CM | POA: Diagnosis not present

## 2015-04-17 DIAGNOSIS — N2581 Secondary hyperparathyroidism of renal origin: Secondary | ICD-10-CM | POA: Diagnosis not present

## 2015-04-17 DIAGNOSIS — D631 Anemia in chronic kidney disease: Secondary | ICD-10-CM | POA: Diagnosis not present

## 2015-04-17 DIAGNOSIS — D509 Iron deficiency anemia, unspecified: Secondary | ICD-10-CM | POA: Diagnosis not present

## 2015-04-19 DIAGNOSIS — D509 Iron deficiency anemia, unspecified: Secondary | ICD-10-CM | POA: Diagnosis not present

## 2015-04-19 DIAGNOSIS — D631 Anemia in chronic kidney disease: Secondary | ICD-10-CM | POA: Diagnosis not present

## 2015-04-19 DIAGNOSIS — N2581 Secondary hyperparathyroidism of renal origin: Secondary | ICD-10-CM | POA: Diagnosis not present

## 2015-04-19 DIAGNOSIS — N186 End stage renal disease: Secondary | ICD-10-CM | POA: Diagnosis not present

## 2015-04-21 DIAGNOSIS — D509 Iron deficiency anemia, unspecified: Secondary | ICD-10-CM | POA: Diagnosis not present

## 2015-04-21 DIAGNOSIS — N186 End stage renal disease: Secondary | ICD-10-CM | POA: Diagnosis not present

## 2015-04-21 DIAGNOSIS — D631 Anemia in chronic kidney disease: Secondary | ICD-10-CM | POA: Diagnosis not present

## 2015-04-21 DIAGNOSIS — N2581 Secondary hyperparathyroidism of renal origin: Secondary | ICD-10-CM | POA: Diagnosis not present

## 2015-04-24 DIAGNOSIS — D509 Iron deficiency anemia, unspecified: Secondary | ICD-10-CM | POA: Diagnosis not present

## 2015-04-24 DIAGNOSIS — D631 Anemia in chronic kidney disease: Secondary | ICD-10-CM | POA: Diagnosis not present

## 2015-04-24 DIAGNOSIS — N2581 Secondary hyperparathyroidism of renal origin: Secondary | ICD-10-CM | POA: Diagnosis not present

## 2015-04-24 DIAGNOSIS — N186 End stage renal disease: Secondary | ICD-10-CM | POA: Diagnosis not present

## 2015-04-26 DIAGNOSIS — D509 Iron deficiency anemia, unspecified: Secondary | ICD-10-CM | POA: Diagnosis not present

## 2015-04-26 DIAGNOSIS — D631 Anemia in chronic kidney disease: Secondary | ICD-10-CM | POA: Diagnosis not present

## 2015-04-26 DIAGNOSIS — N186 End stage renal disease: Secondary | ICD-10-CM | POA: Diagnosis not present

## 2015-04-26 DIAGNOSIS — N2581 Secondary hyperparathyroidism of renal origin: Secondary | ICD-10-CM | POA: Diagnosis not present

## 2015-04-28 DIAGNOSIS — N2581 Secondary hyperparathyroidism of renal origin: Secondary | ICD-10-CM | POA: Diagnosis not present

## 2015-04-28 DIAGNOSIS — D631 Anemia in chronic kidney disease: Secondary | ICD-10-CM | POA: Diagnosis not present

## 2015-04-28 DIAGNOSIS — D509 Iron deficiency anemia, unspecified: Secondary | ICD-10-CM | POA: Diagnosis not present

## 2015-04-28 DIAGNOSIS — N186 End stage renal disease: Secondary | ICD-10-CM | POA: Diagnosis not present

## 2015-04-29 DIAGNOSIS — N186 End stage renal disease: Secondary | ICD-10-CM | POA: Diagnosis not present

## 2015-04-29 DIAGNOSIS — Z992 Dependence on renal dialysis: Secondary | ICD-10-CM | POA: Diagnosis not present

## 2015-04-29 DIAGNOSIS — N033 Chronic nephritic syndrome with diffuse mesangial proliferative glomerulonephritis: Secondary | ICD-10-CM | POA: Diagnosis not present

## 2015-05-01 DIAGNOSIS — D631 Anemia in chronic kidney disease: Secondary | ICD-10-CM | POA: Diagnosis not present

## 2015-05-01 DIAGNOSIS — N186 End stage renal disease: Secondary | ICD-10-CM | POA: Diagnosis not present

## 2015-05-01 DIAGNOSIS — D509 Iron deficiency anemia, unspecified: Secondary | ICD-10-CM | POA: Diagnosis not present

## 2015-05-01 DIAGNOSIS — N2581 Secondary hyperparathyroidism of renal origin: Secondary | ICD-10-CM | POA: Diagnosis not present

## 2015-05-03 DIAGNOSIS — D631 Anemia in chronic kidney disease: Secondary | ICD-10-CM | POA: Diagnosis not present

## 2015-05-03 DIAGNOSIS — D509 Iron deficiency anemia, unspecified: Secondary | ICD-10-CM | POA: Diagnosis not present

## 2015-05-03 DIAGNOSIS — N2581 Secondary hyperparathyroidism of renal origin: Secondary | ICD-10-CM | POA: Diagnosis not present

## 2015-05-03 DIAGNOSIS — N186 End stage renal disease: Secondary | ICD-10-CM | POA: Diagnosis not present

## 2015-05-05 DIAGNOSIS — N186 End stage renal disease: Secondary | ICD-10-CM | POA: Diagnosis not present

## 2015-05-05 DIAGNOSIS — D509 Iron deficiency anemia, unspecified: Secondary | ICD-10-CM | POA: Diagnosis not present

## 2015-05-05 DIAGNOSIS — D631 Anemia in chronic kidney disease: Secondary | ICD-10-CM | POA: Diagnosis not present

## 2015-05-05 DIAGNOSIS — N2581 Secondary hyperparathyroidism of renal origin: Secondary | ICD-10-CM | POA: Diagnosis not present

## 2015-05-08 DIAGNOSIS — N2581 Secondary hyperparathyroidism of renal origin: Secondary | ICD-10-CM | POA: Diagnosis not present

## 2015-05-08 DIAGNOSIS — D509 Iron deficiency anemia, unspecified: Secondary | ICD-10-CM | POA: Diagnosis not present

## 2015-05-08 DIAGNOSIS — D631 Anemia in chronic kidney disease: Secondary | ICD-10-CM | POA: Diagnosis not present

## 2015-05-08 DIAGNOSIS — N186 End stage renal disease: Secondary | ICD-10-CM | POA: Diagnosis not present

## 2015-05-10 DIAGNOSIS — N186 End stage renal disease: Secondary | ICD-10-CM | POA: Diagnosis not present

## 2015-05-10 DIAGNOSIS — D509 Iron deficiency anemia, unspecified: Secondary | ICD-10-CM | POA: Diagnosis not present

## 2015-05-10 DIAGNOSIS — N2581 Secondary hyperparathyroidism of renal origin: Secondary | ICD-10-CM | POA: Diagnosis not present

## 2015-05-10 DIAGNOSIS — D631 Anemia in chronic kidney disease: Secondary | ICD-10-CM | POA: Diagnosis not present

## 2015-05-12 DIAGNOSIS — D509 Iron deficiency anemia, unspecified: Secondary | ICD-10-CM | POA: Diagnosis not present

## 2015-05-12 DIAGNOSIS — N2581 Secondary hyperparathyroidism of renal origin: Secondary | ICD-10-CM | POA: Diagnosis not present

## 2015-05-12 DIAGNOSIS — D631 Anemia in chronic kidney disease: Secondary | ICD-10-CM | POA: Diagnosis not present

## 2015-05-12 DIAGNOSIS — N186 End stage renal disease: Secondary | ICD-10-CM | POA: Diagnosis not present

## 2015-05-15 DIAGNOSIS — D631 Anemia in chronic kidney disease: Secondary | ICD-10-CM | POA: Diagnosis not present

## 2015-05-15 DIAGNOSIS — N2581 Secondary hyperparathyroidism of renal origin: Secondary | ICD-10-CM | POA: Diagnosis not present

## 2015-05-15 DIAGNOSIS — D509 Iron deficiency anemia, unspecified: Secondary | ICD-10-CM | POA: Diagnosis not present

## 2015-05-15 DIAGNOSIS — N186 End stage renal disease: Secondary | ICD-10-CM | POA: Diagnosis not present

## 2015-05-17 DIAGNOSIS — N186 End stage renal disease: Secondary | ICD-10-CM | POA: Diagnosis not present

## 2015-05-17 DIAGNOSIS — D631 Anemia in chronic kidney disease: Secondary | ICD-10-CM | POA: Diagnosis not present

## 2015-05-17 DIAGNOSIS — D509 Iron deficiency anemia, unspecified: Secondary | ICD-10-CM | POA: Diagnosis not present

## 2015-05-17 DIAGNOSIS — N2581 Secondary hyperparathyroidism of renal origin: Secondary | ICD-10-CM | POA: Diagnosis not present

## 2015-05-19 DIAGNOSIS — N2581 Secondary hyperparathyroidism of renal origin: Secondary | ICD-10-CM | POA: Diagnosis not present

## 2015-05-19 DIAGNOSIS — D631 Anemia in chronic kidney disease: Secondary | ICD-10-CM | POA: Diagnosis not present

## 2015-05-19 DIAGNOSIS — N186 End stage renal disease: Secondary | ICD-10-CM | POA: Diagnosis not present

## 2015-05-19 DIAGNOSIS — D509 Iron deficiency anemia, unspecified: Secondary | ICD-10-CM | POA: Diagnosis not present

## 2015-05-22 DIAGNOSIS — D509 Iron deficiency anemia, unspecified: Secondary | ICD-10-CM | POA: Diagnosis not present

## 2015-05-22 DIAGNOSIS — N2581 Secondary hyperparathyroidism of renal origin: Secondary | ICD-10-CM | POA: Diagnosis not present

## 2015-05-22 DIAGNOSIS — D631 Anemia in chronic kidney disease: Secondary | ICD-10-CM | POA: Diagnosis not present

## 2015-05-22 DIAGNOSIS — N186 End stage renal disease: Secondary | ICD-10-CM | POA: Diagnosis not present

## 2015-05-24 DIAGNOSIS — D509 Iron deficiency anemia, unspecified: Secondary | ICD-10-CM | POA: Diagnosis not present

## 2015-05-24 DIAGNOSIS — N2581 Secondary hyperparathyroidism of renal origin: Secondary | ICD-10-CM | POA: Diagnosis not present

## 2015-05-24 DIAGNOSIS — D631 Anemia in chronic kidney disease: Secondary | ICD-10-CM | POA: Diagnosis not present

## 2015-05-24 DIAGNOSIS — N186 End stage renal disease: Secondary | ICD-10-CM | POA: Diagnosis not present

## 2015-05-26 DIAGNOSIS — N2581 Secondary hyperparathyroidism of renal origin: Secondary | ICD-10-CM | POA: Diagnosis not present

## 2015-05-26 DIAGNOSIS — D631 Anemia in chronic kidney disease: Secondary | ICD-10-CM | POA: Diagnosis not present

## 2015-05-26 DIAGNOSIS — D509 Iron deficiency anemia, unspecified: Secondary | ICD-10-CM | POA: Diagnosis not present

## 2015-05-26 DIAGNOSIS — N186 End stage renal disease: Secondary | ICD-10-CM | POA: Diagnosis not present

## 2015-05-29 DIAGNOSIS — N186 End stage renal disease: Secondary | ICD-10-CM | POA: Diagnosis not present

## 2015-05-29 DIAGNOSIS — D509 Iron deficiency anemia, unspecified: Secondary | ICD-10-CM | POA: Diagnosis not present

## 2015-05-29 DIAGNOSIS — N2581 Secondary hyperparathyroidism of renal origin: Secondary | ICD-10-CM | POA: Diagnosis not present

## 2015-05-29 DIAGNOSIS — D631 Anemia in chronic kidney disease: Secondary | ICD-10-CM | POA: Diagnosis not present

## 2015-05-30 DIAGNOSIS — Z992 Dependence on renal dialysis: Secondary | ICD-10-CM | POA: Diagnosis not present

## 2015-05-30 DIAGNOSIS — N033 Chronic nephritic syndrome with diffuse mesangial proliferative glomerulonephritis: Secondary | ICD-10-CM | POA: Diagnosis not present

## 2015-05-30 DIAGNOSIS — N186 End stage renal disease: Secondary | ICD-10-CM | POA: Diagnosis not present

## 2015-05-31 DIAGNOSIS — D631 Anemia in chronic kidney disease: Secondary | ICD-10-CM | POA: Diagnosis not present

## 2015-05-31 DIAGNOSIS — Z23 Encounter for immunization: Secondary | ICD-10-CM | POA: Diagnosis not present

## 2015-05-31 DIAGNOSIS — N2581 Secondary hyperparathyroidism of renal origin: Secondary | ICD-10-CM | POA: Diagnosis not present

## 2015-05-31 DIAGNOSIS — D509 Iron deficiency anemia, unspecified: Secondary | ICD-10-CM | POA: Diagnosis not present

## 2015-05-31 DIAGNOSIS — N186 End stage renal disease: Secondary | ICD-10-CM | POA: Diagnosis not present

## 2015-06-02 DIAGNOSIS — D631 Anemia in chronic kidney disease: Secondary | ICD-10-CM | POA: Diagnosis not present

## 2015-06-02 DIAGNOSIS — Z23 Encounter for immunization: Secondary | ICD-10-CM | POA: Diagnosis not present

## 2015-06-02 DIAGNOSIS — N2581 Secondary hyperparathyroidism of renal origin: Secondary | ICD-10-CM | POA: Diagnosis not present

## 2015-06-02 DIAGNOSIS — N186 End stage renal disease: Secondary | ICD-10-CM | POA: Diagnosis not present

## 2015-06-02 DIAGNOSIS — D509 Iron deficiency anemia, unspecified: Secondary | ICD-10-CM | POA: Diagnosis not present

## 2015-06-06 DIAGNOSIS — Z23 Encounter for immunization: Secondary | ICD-10-CM | POA: Diagnosis not present

## 2015-06-06 DIAGNOSIS — N186 End stage renal disease: Secondary | ICD-10-CM | POA: Diagnosis not present

## 2015-06-06 DIAGNOSIS — D509 Iron deficiency anemia, unspecified: Secondary | ICD-10-CM | POA: Diagnosis not present

## 2015-06-06 DIAGNOSIS — N2581 Secondary hyperparathyroidism of renal origin: Secondary | ICD-10-CM | POA: Diagnosis not present

## 2015-06-06 DIAGNOSIS — D631 Anemia in chronic kidney disease: Secondary | ICD-10-CM | POA: Diagnosis not present

## 2015-06-07 DIAGNOSIS — N2581 Secondary hyperparathyroidism of renal origin: Secondary | ICD-10-CM | POA: Diagnosis not present

## 2015-06-07 DIAGNOSIS — D631 Anemia in chronic kidney disease: Secondary | ICD-10-CM | POA: Diagnosis not present

## 2015-06-07 DIAGNOSIS — Z23 Encounter for immunization: Secondary | ICD-10-CM | POA: Diagnosis not present

## 2015-06-07 DIAGNOSIS — N186 End stage renal disease: Secondary | ICD-10-CM | POA: Diagnosis not present

## 2015-06-07 DIAGNOSIS — D509 Iron deficiency anemia, unspecified: Secondary | ICD-10-CM | POA: Diagnosis not present

## 2015-06-12 DIAGNOSIS — D509 Iron deficiency anemia, unspecified: Secondary | ICD-10-CM | POA: Diagnosis not present

## 2015-06-12 DIAGNOSIS — D631 Anemia in chronic kidney disease: Secondary | ICD-10-CM | POA: Diagnosis not present

## 2015-06-12 DIAGNOSIS — N2581 Secondary hyperparathyroidism of renal origin: Secondary | ICD-10-CM | POA: Diagnosis not present

## 2015-06-12 DIAGNOSIS — N186 End stage renal disease: Secondary | ICD-10-CM | POA: Diagnosis not present

## 2015-06-12 DIAGNOSIS — Z23 Encounter for immunization: Secondary | ICD-10-CM | POA: Diagnosis not present

## 2015-06-14 DIAGNOSIS — N2581 Secondary hyperparathyroidism of renal origin: Secondary | ICD-10-CM | POA: Diagnosis not present

## 2015-06-14 DIAGNOSIS — D631 Anemia in chronic kidney disease: Secondary | ICD-10-CM | POA: Diagnosis not present

## 2015-06-14 DIAGNOSIS — D509 Iron deficiency anemia, unspecified: Secondary | ICD-10-CM | POA: Diagnosis not present

## 2015-06-14 DIAGNOSIS — Z23 Encounter for immunization: Secondary | ICD-10-CM | POA: Diagnosis not present

## 2015-06-14 DIAGNOSIS — N186 End stage renal disease: Secondary | ICD-10-CM | POA: Diagnosis not present

## 2015-06-16 DIAGNOSIS — Z23 Encounter for immunization: Secondary | ICD-10-CM | POA: Diagnosis not present

## 2015-06-16 DIAGNOSIS — N186 End stage renal disease: Secondary | ICD-10-CM | POA: Diagnosis not present

## 2015-06-16 DIAGNOSIS — D509 Iron deficiency anemia, unspecified: Secondary | ICD-10-CM | POA: Diagnosis not present

## 2015-06-16 DIAGNOSIS — D631 Anemia in chronic kidney disease: Secondary | ICD-10-CM | POA: Diagnosis not present

## 2015-06-16 DIAGNOSIS — N2581 Secondary hyperparathyroidism of renal origin: Secondary | ICD-10-CM | POA: Diagnosis not present

## 2015-06-19 DIAGNOSIS — N2581 Secondary hyperparathyroidism of renal origin: Secondary | ICD-10-CM | POA: Diagnosis not present

## 2015-06-19 DIAGNOSIS — D509 Iron deficiency anemia, unspecified: Secondary | ICD-10-CM | POA: Diagnosis not present

## 2015-06-19 DIAGNOSIS — N186 End stage renal disease: Secondary | ICD-10-CM | POA: Diagnosis not present

## 2015-06-19 DIAGNOSIS — Z23 Encounter for immunization: Secondary | ICD-10-CM | POA: Diagnosis not present

## 2015-06-19 DIAGNOSIS — D631 Anemia in chronic kidney disease: Secondary | ICD-10-CM | POA: Diagnosis not present

## 2015-06-21 DIAGNOSIS — N186 End stage renal disease: Secondary | ICD-10-CM | POA: Diagnosis not present

## 2015-06-21 DIAGNOSIS — D509 Iron deficiency anemia, unspecified: Secondary | ICD-10-CM | POA: Diagnosis not present

## 2015-06-21 DIAGNOSIS — N2581 Secondary hyperparathyroidism of renal origin: Secondary | ICD-10-CM | POA: Diagnosis not present

## 2015-06-21 DIAGNOSIS — D631 Anemia in chronic kidney disease: Secondary | ICD-10-CM | POA: Diagnosis not present

## 2015-06-21 DIAGNOSIS — Z23 Encounter for immunization: Secondary | ICD-10-CM | POA: Diagnosis not present

## 2015-06-23 DIAGNOSIS — D631 Anemia in chronic kidney disease: Secondary | ICD-10-CM | POA: Diagnosis not present

## 2015-06-23 DIAGNOSIS — N186 End stage renal disease: Secondary | ICD-10-CM | POA: Diagnosis not present

## 2015-06-23 DIAGNOSIS — N2581 Secondary hyperparathyroidism of renal origin: Secondary | ICD-10-CM | POA: Diagnosis not present

## 2015-06-23 DIAGNOSIS — Z23 Encounter for immunization: Secondary | ICD-10-CM | POA: Diagnosis not present

## 2015-06-23 DIAGNOSIS — D509 Iron deficiency anemia, unspecified: Secondary | ICD-10-CM | POA: Diagnosis not present

## 2015-06-26 DIAGNOSIS — D631 Anemia in chronic kidney disease: Secondary | ICD-10-CM | POA: Diagnosis not present

## 2015-06-26 DIAGNOSIS — N2581 Secondary hyperparathyroidism of renal origin: Secondary | ICD-10-CM | POA: Diagnosis not present

## 2015-06-26 DIAGNOSIS — Z23 Encounter for immunization: Secondary | ICD-10-CM | POA: Diagnosis not present

## 2015-06-26 DIAGNOSIS — D509 Iron deficiency anemia, unspecified: Secondary | ICD-10-CM | POA: Diagnosis not present

## 2015-06-26 DIAGNOSIS — N186 End stage renal disease: Secondary | ICD-10-CM | POA: Diagnosis not present

## 2015-06-28 DIAGNOSIS — N186 End stage renal disease: Secondary | ICD-10-CM | POA: Diagnosis not present

## 2015-06-28 DIAGNOSIS — D509 Iron deficiency anemia, unspecified: Secondary | ICD-10-CM | POA: Diagnosis not present

## 2015-06-28 DIAGNOSIS — D631 Anemia in chronic kidney disease: Secondary | ICD-10-CM | POA: Diagnosis not present

## 2015-06-28 DIAGNOSIS — N2581 Secondary hyperparathyroidism of renal origin: Secondary | ICD-10-CM | POA: Diagnosis not present

## 2015-06-28 DIAGNOSIS — Z23 Encounter for immunization: Secondary | ICD-10-CM | POA: Diagnosis not present

## 2015-06-29 DIAGNOSIS — N033 Chronic nephritic syndrome with diffuse mesangial proliferative glomerulonephritis: Secondary | ICD-10-CM | POA: Diagnosis not present

## 2015-06-29 DIAGNOSIS — N186 End stage renal disease: Secondary | ICD-10-CM | POA: Diagnosis not present

## 2015-06-29 DIAGNOSIS — Z992 Dependence on renal dialysis: Secondary | ICD-10-CM | POA: Diagnosis not present

## 2015-06-30 DIAGNOSIS — N186 End stage renal disease: Secondary | ICD-10-CM | POA: Diagnosis not present

## 2015-06-30 DIAGNOSIS — D509 Iron deficiency anemia, unspecified: Secondary | ICD-10-CM | POA: Diagnosis not present

## 2015-06-30 DIAGNOSIS — N2581 Secondary hyperparathyroidism of renal origin: Secondary | ICD-10-CM | POA: Diagnosis not present

## 2015-07-03 DIAGNOSIS — N2581 Secondary hyperparathyroidism of renal origin: Secondary | ICD-10-CM | POA: Diagnosis not present

## 2015-07-03 DIAGNOSIS — D509 Iron deficiency anemia, unspecified: Secondary | ICD-10-CM | POA: Diagnosis not present

## 2015-07-03 DIAGNOSIS — N186 End stage renal disease: Secondary | ICD-10-CM | POA: Diagnosis not present

## 2015-07-05 DIAGNOSIS — N186 End stage renal disease: Secondary | ICD-10-CM | POA: Diagnosis not present

## 2015-07-05 DIAGNOSIS — D509 Iron deficiency anemia, unspecified: Secondary | ICD-10-CM | POA: Diagnosis not present

## 2015-07-05 DIAGNOSIS — N2581 Secondary hyperparathyroidism of renal origin: Secondary | ICD-10-CM | POA: Diagnosis not present

## 2015-07-07 DIAGNOSIS — N186 End stage renal disease: Secondary | ICD-10-CM | POA: Diagnosis not present

## 2015-07-07 DIAGNOSIS — D509 Iron deficiency anemia, unspecified: Secondary | ICD-10-CM | POA: Diagnosis not present

## 2015-07-07 DIAGNOSIS — N2581 Secondary hyperparathyroidism of renal origin: Secondary | ICD-10-CM | POA: Diagnosis not present

## 2015-07-10 DIAGNOSIS — N186 End stage renal disease: Secondary | ICD-10-CM | POA: Diagnosis not present

## 2015-07-10 DIAGNOSIS — D509 Iron deficiency anemia, unspecified: Secondary | ICD-10-CM | POA: Diagnosis not present

## 2015-07-10 DIAGNOSIS — N2581 Secondary hyperparathyroidism of renal origin: Secondary | ICD-10-CM | POA: Diagnosis not present

## 2015-07-12 DIAGNOSIS — N2581 Secondary hyperparathyroidism of renal origin: Secondary | ICD-10-CM | POA: Diagnosis not present

## 2015-07-12 DIAGNOSIS — D509 Iron deficiency anemia, unspecified: Secondary | ICD-10-CM | POA: Diagnosis not present

## 2015-07-12 DIAGNOSIS — N186 End stage renal disease: Secondary | ICD-10-CM | POA: Diagnosis not present

## 2015-07-14 DIAGNOSIS — N186 End stage renal disease: Secondary | ICD-10-CM | POA: Diagnosis not present

## 2015-07-14 DIAGNOSIS — D509 Iron deficiency anemia, unspecified: Secondary | ICD-10-CM | POA: Diagnosis not present

## 2015-07-14 DIAGNOSIS — N2581 Secondary hyperparathyroidism of renal origin: Secondary | ICD-10-CM | POA: Diagnosis not present

## 2015-07-17 DIAGNOSIS — N2581 Secondary hyperparathyroidism of renal origin: Secondary | ICD-10-CM | POA: Diagnosis not present

## 2015-07-17 DIAGNOSIS — N186 End stage renal disease: Secondary | ICD-10-CM | POA: Diagnosis not present

## 2015-07-17 DIAGNOSIS — D509 Iron deficiency anemia, unspecified: Secondary | ICD-10-CM | POA: Diagnosis not present

## 2015-07-19 DIAGNOSIS — N186 End stage renal disease: Secondary | ICD-10-CM | POA: Diagnosis not present

## 2015-07-19 DIAGNOSIS — D509 Iron deficiency anemia, unspecified: Secondary | ICD-10-CM | POA: Diagnosis not present

## 2015-07-19 DIAGNOSIS — N2581 Secondary hyperparathyroidism of renal origin: Secondary | ICD-10-CM | POA: Diagnosis not present

## 2015-07-21 DIAGNOSIS — N186 End stage renal disease: Secondary | ICD-10-CM | POA: Diagnosis not present

## 2015-07-21 DIAGNOSIS — N2581 Secondary hyperparathyroidism of renal origin: Secondary | ICD-10-CM | POA: Diagnosis not present

## 2015-07-21 DIAGNOSIS — D509 Iron deficiency anemia, unspecified: Secondary | ICD-10-CM | POA: Diagnosis not present

## 2015-07-24 DIAGNOSIS — N186 End stage renal disease: Secondary | ICD-10-CM | POA: Diagnosis not present

## 2015-07-24 DIAGNOSIS — N2581 Secondary hyperparathyroidism of renal origin: Secondary | ICD-10-CM | POA: Diagnosis not present

## 2015-07-24 DIAGNOSIS — D509 Iron deficiency anemia, unspecified: Secondary | ICD-10-CM | POA: Diagnosis not present

## 2015-07-30 DIAGNOSIS — N186 End stage renal disease: Secondary | ICD-10-CM | POA: Diagnosis not present

## 2015-07-30 DIAGNOSIS — Z992 Dependence on renal dialysis: Secondary | ICD-10-CM | POA: Diagnosis not present

## 2015-07-30 DIAGNOSIS — N033 Chronic nephritic syndrome with diffuse mesangial proliferative glomerulonephritis: Secondary | ICD-10-CM | POA: Diagnosis not present

## 2015-07-31 DIAGNOSIS — D509 Iron deficiency anemia, unspecified: Secondary | ICD-10-CM | POA: Diagnosis not present

## 2015-07-31 DIAGNOSIS — D631 Anemia in chronic kidney disease: Secondary | ICD-10-CM | POA: Diagnosis not present

## 2015-07-31 DIAGNOSIS — N186 End stage renal disease: Secondary | ICD-10-CM | POA: Diagnosis not present

## 2015-07-31 DIAGNOSIS — Z23 Encounter for immunization: Secondary | ICD-10-CM | POA: Diagnosis not present

## 2015-07-31 DIAGNOSIS — N2581 Secondary hyperparathyroidism of renal origin: Secondary | ICD-10-CM | POA: Diagnosis not present

## 2015-08-02 DIAGNOSIS — N186 End stage renal disease: Secondary | ICD-10-CM | POA: Diagnosis not present

## 2015-08-02 DIAGNOSIS — D509 Iron deficiency anemia, unspecified: Secondary | ICD-10-CM | POA: Diagnosis not present

## 2015-08-02 DIAGNOSIS — N2581 Secondary hyperparathyroidism of renal origin: Secondary | ICD-10-CM | POA: Diagnosis not present

## 2015-08-02 DIAGNOSIS — Z23 Encounter for immunization: Secondary | ICD-10-CM | POA: Diagnosis not present

## 2015-08-02 DIAGNOSIS — D631 Anemia in chronic kidney disease: Secondary | ICD-10-CM | POA: Diagnosis not present

## 2015-08-06 DIAGNOSIS — D509 Iron deficiency anemia, unspecified: Secondary | ICD-10-CM | POA: Diagnosis not present

## 2015-08-06 DIAGNOSIS — N2581 Secondary hyperparathyroidism of renal origin: Secondary | ICD-10-CM | POA: Diagnosis not present

## 2015-08-06 DIAGNOSIS — D631 Anemia in chronic kidney disease: Secondary | ICD-10-CM | POA: Diagnosis not present

## 2015-08-06 DIAGNOSIS — Z23 Encounter for immunization: Secondary | ICD-10-CM | POA: Diagnosis not present

## 2015-08-06 DIAGNOSIS — N186 End stage renal disease: Secondary | ICD-10-CM | POA: Diagnosis not present

## 2015-08-09 DIAGNOSIS — Z23 Encounter for immunization: Secondary | ICD-10-CM | POA: Diagnosis not present

## 2015-08-09 DIAGNOSIS — D509 Iron deficiency anemia, unspecified: Secondary | ICD-10-CM | POA: Diagnosis not present

## 2015-08-09 DIAGNOSIS — N2581 Secondary hyperparathyroidism of renal origin: Secondary | ICD-10-CM | POA: Diagnosis not present

## 2015-08-09 DIAGNOSIS — N186 End stage renal disease: Secondary | ICD-10-CM | POA: Diagnosis not present

## 2015-08-09 DIAGNOSIS — D631 Anemia in chronic kidney disease: Secondary | ICD-10-CM | POA: Diagnosis not present

## 2015-08-14 DIAGNOSIS — N186 End stage renal disease: Secondary | ICD-10-CM | POA: Diagnosis not present

## 2015-08-14 DIAGNOSIS — Z23 Encounter for immunization: Secondary | ICD-10-CM | POA: Diagnosis not present

## 2015-08-14 DIAGNOSIS — N2581 Secondary hyperparathyroidism of renal origin: Secondary | ICD-10-CM | POA: Diagnosis not present

## 2015-08-14 DIAGNOSIS — D631 Anemia in chronic kidney disease: Secondary | ICD-10-CM | POA: Diagnosis not present

## 2015-08-14 DIAGNOSIS — D509 Iron deficiency anemia, unspecified: Secondary | ICD-10-CM | POA: Diagnosis not present

## 2015-08-16 DIAGNOSIS — N186 End stage renal disease: Secondary | ICD-10-CM | POA: Diagnosis not present

## 2015-08-16 DIAGNOSIS — D509 Iron deficiency anemia, unspecified: Secondary | ICD-10-CM | POA: Diagnosis not present

## 2015-08-16 DIAGNOSIS — N2581 Secondary hyperparathyroidism of renal origin: Secondary | ICD-10-CM | POA: Diagnosis not present

## 2015-08-16 DIAGNOSIS — Z23 Encounter for immunization: Secondary | ICD-10-CM | POA: Diagnosis not present

## 2015-08-16 DIAGNOSIS — D631 Anemia in chronic kidney disease: Secondary | ICD-10-CM | POA: Diagnosis not present

## 2015-08-18 DIAGNOSIS — D509 Iron deficiency anemia, unspecified: Secondary | ICD-10-CM | POA: Diagnosis not present

## 2015-08-18 DIAGNOSIS — D631 Anemia in chronic kidney disease: Secondary | ICD-10-CM | POA: Diagnosis not present

## 2015-08-18 DIAGNOSIS — Z23 Encounter for immunization: Secondary | ICD-10-CM | POA: Diagnosis not present

## 2015-08-18 DIAGNOSIS — N186 End stage renal disease: Secondary | ICD-10-CM | POA: Diagnosis not present

## 2015-08-18 DIAGNOSIS — N2581 Secondary hyperparathyroidism of renal origin: Secondary | ICD-10-CM | POA: Diagnosis not present

## 2015-08-21 DIAGNOSIS — Z23 Encounter for immunization: Secondary | ICD-10-CM | POA: Diagnosis not present

## 2015-08-21 DIAGNOSIS — D509 Iron deficiency anemia, unspecified: Secondary | ICD-10-CM | POA: Diagnosis not present

## 2015-08-21 DIAGNOSIS — N2581 Secondary hyperparathyroidism of renal origin: Secondary | ICD-10-CM | POA: Diagnosis not present

## 2015-08-21 DIAGNOSIS — D631 Anemia in chronic kidney disease: Secondary | ICD-10-CM | POA: Diagnosis not present

## 2015-08-21 DIAGNOSIS — N186 End stage renal disease: Secondary | ICD-10-CM | POA: Diagnosis not present

## 2015-08-24 DIAGNOSIS — N186 End stage renal disease: Secondary | ICD-10-CM | POA: Diagnosis not present

## 2015-08-24 DIAGNOSIS — N2581 Secondary hyperparathyroidism of renal origin: Secondary | ICD-10-CM | POA: Diagnosis not present

## 2015-08-24 DIAGNOSIS — D509 Iron deficiency anemia, unspecified: Secondary | ICD-10-CM | POA: Diagnosis not present

## 2015-08-24 DIAGNOSIS — Z23 Encounter for immunization: Secondary | ICD-10-CM | POA: Diagnosis not present

## 2015-08-24 DIAGNOSIS — D631 Anemia in chronic kidney disease: Secondary | ICD-10-CM | POA: Diagnosis not present

## 2015-08-28 DIAGNOSIS — N2581 Secondary hyperparathyroidism of renal origin: Secondary | ICD-10-CM | POA: Diagnosis not present

## 2015-08-28 DIAGNOSIS — D631 Anemia in chronic kidney disease: Secondary | ICD-10-CM | POA: Diagnosis not present

## 2015-08-28 DIAGNOSIS — N186 End stage renal disease: Secondary | ICD-10-CM | POA: Diagnosis not present

## 2015-08-28 DIAGNOSIS — D509 Iron deficiency anemia, unspecified: Secondary | ICD-10-CM | POA: Diagnosis not present

## 2015-08-28 DIAGNOSIS — Z23 Encounter for immunization: Secondary | ICD-10-CM | POA: Diagnosis not present

## 2015-08-29 DIAGNOSIS — N186 End stage renal disease: Secondary | ICD-10-CM | POA: Diagnosis not present

## 2015-08-29 DIAGNOSIS — N033 Chronic nephritic syndrome with diffuse mesangial proliferative glomerulonephritis: Secondary | ICD-10-CM | POA: Diagnosis not present

## 2015-08-29 DIAGNOSIS — Z992 Dependence on renal dialysis: Secondary | ICD-10-CM | POA: Diagnosis not present

## 2015-08-30 DIAGNOSIS — N186 End stage renal disease: Secondary | ICD-10-CM | POA: Diagnosis not present

## 2015-08-30 DIAGNOSIS — N2581 Secondary hyperparathyroidism of renal origin: Secondary | ICD-10-CM | POA: Diagnosis not present

## 2015-08-30 DIAGNOSIS — D631 Anemia in chronic kidney disease: Secondary | ICD-10-CM | POA: Diagnosis not present

## 2015-08-30 DIAGNOSIS — D509 Iron deficiency anemia, unspecified: Secondary | ICD-10-CM | POA: Diagnosis not present

## 2015-09-01 DIAGNOSIS — D631 Anemia in chronic kidney disease: Secondary | ICD-10-CM | POA: Diagnosis not present

## 2015-09-01 DIAGNOSIS — N2581 Secondary hyperparathyroidism of renal origin: Secondary | ICD-10-CM | POA: Diagnosis not present

## 2015-09-01 DIAGNOSIS — D509 Iron deficiency anemia, unspecified: Secondary | ICD-10-CM | POA: Diagnosis not present

## 2015-09-01 DIAGNOSIS — N186 End stage renal disease: Secondary | ICD-10-CM | POA: Diagnosis not present

## 2015-09-04 DIAGNOSIS — N186 End stage renal disease: Secondary | ICD-10-CM | POA: Diagnosis not present

## 2015-09-04 DIAGNOSIS — D509 Iron deficiency anemia, unspecified: Secondary | ICD-10-CM | POA: Diagnosis not present

## 2015-09-04 DIAGNOSIS — N2581 Secondary hyperparathyroidism of renal origin: Secondary | ICD-10-CM | POA: Diagnosis not present

## 2015-09-04 DIAGNOSIS — D631 Anemia in chronic kidney disease: Secondary | ICD-10-CM | POA: Diagnosis not present

## 2015-09-06 DIAGNOSIS — D631 Anemia in chronic kidney disease: Secondary | ICD-10-CM | POA: Diagnosis not present

## 2015-09-06 DIAGNOSIS — N186 End stage renal disease: Secondary | ICD-10-CM | POA: Diagnosis not present

## 2015-09-06 DIAGNOSIS — N2581 Secondary hyperparathyroidism of renal origin: Secondary | ICD-10-CM | POA: Diagnosis not present

## 2015-09-06 DIAGNOSIS — D509 Iron deficiency anemia, unspecified: Secondary | ICD-10-CM | POA: Diagnosis not present

## 2015-09-10 DIAGNOSIS — N2581 Secondary hyperparathyroidism of renal origin: Secondary | ICD-10-CM | POA: Diagnosis not present

## 2015-09-10 DIAGNOSIS — D631 Anemia in chronic kidney disease: Secondary | ICD-10-CM | POA: Diagnosis not present

## 2015-09-10 DIAGNOSIS — D509 Iron deficiency anemia, unspecified: Secondary | ICD-10-CM | POA: Diagnosis not present

## 2015-09-10 DIAGNOSIS — N186 End stage renal disease: Secondary | ICD-10-CM | POA: Diagnosis not present

## 2015-09-11 DIAGNOSIS — N2581 Secondary hyperparathyroidism of renal origin: Secondary | ICD-10-CM | POA: Diagnosis not present

## 2015-09-11 DIAGNOSIS — D631 Anemia in chronic kidney disease: Secondary | ICD-10-CM | POA: Diagnosis not present

## 2015-09-11 DIAGNOSIS — N186 End stage renal disease: Secondary | ICD-10-CM | POA: Diagnosis not present

## 2015-09-11 DIAGNOSIS — D509 Iron deficiency anemia, unspecified: Secondary | ICD-10-CM | POA: Diagnosis not present

## 2015-09-13 DIAGNOSIS — N2581 Secondary hyperparathyroidism of renal origin: Secondary | ICD-10-CM | POA: Diagnosis not present

## 2015-09-13 DIAGNOSIS — D631 Anemia in chronic kidney disease: Secondary | ICD-10-CM | POA: Diagnosis not present

## 2015-09-13 DIAGNOSIS — N186 End stage renal disease: Secondary | ICD-10-CM | POA: Diagnosis not present

## 2015-09-13 DIAGNOSIS — D509 Iron deficiency anemia, unspecified: Secondary | ICD-10-CM | POA: Diagnosis not present

## 2015-09-18 DIAGNOSIS — N186 End stage renal disease: Secondary | ICD-10-CM | POA: Diagnosis not present

## 2015-09-18 DIAGNOSIS — D631 Anemia in chronic kidney disease: Secondary | ICD-10-CM | POA: Diagnosis not present

## 2015-09-18 DIAGNOSIS — D509 Iron deficiency anemia, unspecified: Secondary | ICD-10-CM | POA: Diagnosis not present

## 2015-09-18 DIAGNOSIS — N2581 Secondary hyperparathyroidism of renal origin: Secondary | ICD-10-CM | POA: Diagnosis not present

## 2015-09-21 DIAGNOSIS — N186 End stage renal disease: Secondary | ICD-10-CM | POA: Diagnosis not present

## 2015-09-21 DIAGNOSIS — D631 Anemia in chronic kidney disease: Secondary | ICD-10-CM | POA: Diagnosis not present

## 2015-09-21 DIAGNOSIS — D509 Iron deficiency anemia, unspecified: Secondary | ICD-10-CM | POA: Diagnosis not present

## 2015-09-21 DIAGNOSIS — N2581 Secondary hyperparathyroidism of renal origin: Secondary | ICD-10-CM | POA: Diagnosis not present

## 2015-09-25 DIAGNOSIS — D509 Iron deficiency anemia, unspecified: Secondary | ICD-10-CM | POA: Diagnosis not present

## 2015-09-25 DIAGNOSIS — N186 End stage renal disease: Secondary | ICD-10-CM | POA: Diagnosis not present

## 2015-09-25 DIAGNOSIS — D631 Anemia in chronic kidney disease: Secondary | ICD-10-CM | POA: Diagnosis not present

## 2015-09-25 DIAGNOSIS — N2581 Secondary hyperparathyroidism of renal origin: Secondary | ICD-10-CM | POA: Diagnosis not present

## 2015-09-27 DIAGNOSIS — N186 End stage renal disease: Secondary | ICD-10-CM | POA: Diagnosis not present

## 2015-09-27 DIAGNOSIS — N2581 Secondary hyperparathyroidism of renal origin: Secondary | ICD-10-CM | POA: Diagnosis not present

## 2015-09-27 DIAGNOSIS — D631 Anemia in chronic kidney disease: Secondary | ICD-10-CM | POA: Diagnosis not present

## 2015-09-27 DIAGNOSIS — D509 Iron deficiency anemia, unspecified: Secondary | ICD-10-CM | POA: Diagnosis not present

## 2015-09-29 DIAGNOSIS — D631 Anemia in chronic kidney disease: Secondary | ICD-10-CM | POA: Diagnosis not present

## 2015-09-29 DIAGNOSIS — Z992 Dependence on renal dialysis: Secondary | ICD-10-CM | POA: Diagnosis not present

## 2015-09-29 DIAGNOSIS — N186 End stage renal disease: Secondary | ICD-10-CM | POA: Diagnosis not present

## 2015-09-29 DIAGNOSIS — N2581 Secondary hyperparathyroidism of renal origin: Secondary | ICD-10-CM | POA: Diagnosis not present

## 2015-09-29 DIAGNOSIS — N033 Chronic nephritic syndrome with diffuse mesangial proliferative glomerulonephritis: Secondary | ICD-10-CM | POA: Diagnosis not present

## 2015-09-29 DIAGNOSIS — D509 Iron deficiency anemia, unspecified: Secondary | ICD-10-CM | POA: Diagnosis not present

## 2015-10-02 DIAGNOSIS — N2581 Secondary hyperparathyroidism of renal origin: Secondary | ICD-10-CM | POA: Diagnosis not present

## 2015-10-02 DIAGNOSIS — D509 Iron deficiency anemia, unspecified: Secondary | ICD-10-CM | POA: Diagnosis not present

## 2015-10-02 DIAGNOSIS — N186 End stage renal disease: Secondary | ICD-10-CM | POA: Diagnosis not present

## 2015-10-04 DIAGNOSIS — D509 Iron deficiency anemia, unspecified: Secondary | ICD-10-CM | POA: Diagnosis not present

## 2015-10-04 DIAGNOSIS — N186 End stage renal disease: Secondary | ICD-10-CM | POA: Diagnosis not present

## 2015-10-04 DIAGNOSIS — N2581 Secondary hyperparathyroidism of renal origin: Secondary | ICD-10-CM | POA: Diagnosis not present

## 2015-10-09 DIAGNOSIS — D509 Iron deficiency anemia, unspecified: Secondary | ICD-10-CM | POA: Diagnosis not present

## 2015-10-09 DIAGNOSIS — N186 End stage renal disease: Secondary | ICD-10-CM | POA: Diagnosis not present

## 2015-10-09 DIAGNOSIS — N2581 Secondary hyperparathyroidism of renal origin: Secondary | ICD-10-CM | POA: Diagnosis not present

## 2015-10-11 DIAGNOSIS — N186 End stage renal disease: Secondary | ICD-10-CM | POA: Diagnosis not present

## 2015-10-11 DIAGNOSIS — D509 Iron deficiency anemia, unspecified: Secondary | ICD-10-CM | POA: Diagnosis not present

## 2015-10-11 DIAGNOSIS — N2581 Secondary hyperparathyroidism of renal origin: Secondary | ICD-10-CM | POA: Diagnosis not present

## 2015-10-13 DIAGNOSIS — N2581 Secondary hyperparathyroidism of renal origin: Secondary | ICD-10-CM | POA: Diagnosis not present

## 2015-10-13 DIAGNOSIS — N186 End stage renal disease: Secondary | ICD-10-CM | POA: Diagnosis not present

## 2015-10-13 DIAGNOSIS — D509 Iron deficiency anemia, unspecified: Secondary | ICD-10-CM | POA: Diagnosis not present

## 2015-10-16 DIAGNOSIS — D509 Iron deficiency anemia, unspecified: Secondary | ICD-10-CM | POA: Diagnosis not present

## 2015-10-16 DIAGNOSIS — N2581 Secondary hyperparathyroidism of renal origin: Secondary | ICD-10-CM | POA: Diagnosis not present

## 2015-10-16 DIAGNOSIS — N186 End stage renal disease: Secondary | ICD-10-CM | POA: Diagnosis not present

## 2015-10-18 DIAGNOSIS — N186 End stage renal disease: Secondary | ICD-10-CM | POA: Diagnosis not present

## 2015-10-18 DIAGNOSIS — N2581 Secondary hyperparathyroidism of renal origin: Secondary | ICD-10-CM | POA: Diagnosis not present

## 2015-10-18 DIAGNOSIS — D509 Iron deficiency anemia, unspecified: Secondary | ICD-10-CM | POA: Diagnosis not present

## 2015-10-20 DIAGNOSIS — N2581 Secondary hyperparathyroidism of renal origin: Secondary | ICD-10-CM | POA: Diagnosis not present

## 2015-10-20 DIAGNOSIS — N186 End stage renal disease: Secondary | ICD-10-CM | POA: Diagnosis not present

## 2015-10-20 DIAGNOSIS — D509 Iron deficiency anemia, unspecified: Secondary | ICD-10-CM | POA: Diagnosis not present

## 2015-10-23 DIAGNOSIS — N2581 Secondary hyperparathyroidism of renal origin: Secondary | ICD-10-CM | POA: Diagnosis not present

## 2015-10-23 DIAGNOSIS — D509 Iron deficiency anemia, unspecified: Secondary | ICD-10-CM | POA: Diagnosis not present

## 2015-10-23 DIAGNOSIS — N186 End stage renal disease: Secondary | ICD-10-CM | POA: Diagnosis not present

## 2015-10-26 DIAGNOSIS — N2581 Secondary hyperparathyroidism of renal origin: Secondary | ICD-10-CM | POA: Diagnosis not present

## 2015-10-26 DIAGNOSIS — D509 Iron deficiency anemia, unspecified: Secondary | ICD-10-CM | POA: Diagnosis not present

## 2015-10-26 DIAGNOSIS — N186 End stage renal disease: Secondary | ICD-10-CM | POA: Diagnosis not present

## 2015-10-27 DIAGNOSIS — D509 Iron deficiency anemia, unspecified: Secondary | ICD-10-CM | POA: Diagnosis not present

## 2015-10-27 DIAGNOSIS — N186 End stage renal disease: Secondary | ICD-10-CM | POA: Diagnosis not present

## 2015-10-27 DIAGNOSIS — N2581 Secondary hyperparathyroidism of renal origin: Secondary | ICD-10-CM | POA: Diagnosis not present

## 2015-10-30 DIAGNOSIS — Z992 Dependence on renal dialysis: Secondary | ICD-10-CM | POA: Diagnosis not present

## 2015-10-30 DIAGNOSIS — N186 End stage renal disease: Secondary | ICD-10-CM | POA: Diagnosis not present

## 2015-10-30 DIAGNOSIS — D509 Iron deficiency anemia, unspecified: Secondary | ICD-10-CM | POA: Diagnosis not present

## 2015-10-30 DIAGNOSIS — N033 Chronic nephritic syndrome with diffuse mesangial proliferative glomerulonephritis: Secondary | ICD-10-CM | POA: Diagnosis not present

## 2015-10-30 DIAGNOSIS — N2581 Secondary hyperparathyroidism of renal origin: Secondary | ICD-10-CM | POA: Diagnosis not present

## 2015-11-01 DIAGNOSIS — N2581 Secondary hyperparathyroidism of renal origin: Secondary | ICD-10-CM | POA: Diagnosis not present

## 2015-11-01 DIAGNOSIS — N186 End stage renal disease: Secondary | ICD-10-CM | POA: Diagnosis not present

## 2015-11-01 DIAGNOSIS — D509 Iron deficiency anemia, unspecified: Secondary | ICD-10-CM | POA: Diagnosis not present

## 2015-11-03 DIAGNOSIS — N2581 Secondary hyperparathyroidism of renal origin: Secondary | ICD-10-CM | POA: Diagnosis not present

## 2015-11-03 DIAGNOSIS — N186 End stage renal disease: Secondary | ICD-10-CM | POA: Diagnosis not present

## 2015-11-03 DIAGNOSIS — D509 Iron deficiency anemia, unspecified: Secondary | ICD-10-CM | POA: Diagnosis not present

## 2015-11-06 DIAGNOSIS — N186 End stage renal disease: Secondary | ICD-10-CM | POA: Diagnosis not present

## 2015-11-06 DIAGNOSIS — D509 Iron deficiency anemia, unspecified: Secondary | ICD-10-CM | POA: Diagnosis not present

## 2015-11-06 DIAGNOSIS — N2581 Secondary hyperparathyroidism of renal origin: Secondary | ICD-10-CM | POA: Diagnosis not present

## 2015-11-08 DIAGNOSIS — N186 End stage renal disease: Secondary | ICD-10-CM | POA: Diagnosis not present

## 2015-11-08 DIAGNOSIS — N2581 Secondary hyperparathyroidism of renal origin: Secondary | ICD-10-CM | POA: Diagnosis not present

## 2015-11-08 DIAGNOSIS — D509 Iron deficiency anemia, unspecified: Secondary | ICD-10-CM | POA: Diagnosis not present

## 2015-11-10 DIAGNOSIS — D509 Iron deficiency anemia, unspecified: Secondary | ICD-10-CM | POA: Diagnosis not present

## 2015-11-10 DIAGNOSIS — N186 End stage renal disease: Secondary | ICD-10-CM | POA: Diagnosis not present

## 2015-11-10 DIAGNOSIS — N2581 Secondary hyperparathyroidism of renal origin: Secondary | ICD-10-CM | POA: Diagnosis not present

## 2015-11-13 DIAGNOSIS — N2581 Secondary hyperparathyroidism of renal origin: Secondary | ICD-10-CM | POA: Diagnosis not present

## 2015-11-13 DIAGNOSIS — N186 End stage renal disease: Secondary | ICD-10-CM | POA: Diagnosis not present

## 2015-11-13 DIAGNOSIS — D509 Iron deficiency anemia, unspecified: Secondary | ICD-10-CM | POA: Diagnosis not present

## 2015-11-15 DIAGNOSIS — D509 Iron deficiency anemia, unspecified: Secondary | ICD-10-CM | POA: Diagnosis not present

## 2015-11-15 DIAGNOSIS — N186 End stage renal disease: Secondary | ICD-10-CM | POA: Diagnosis not present

## 2015-11-15 DIAGNOSIS — N2581 Secondary hyperparathyroidism of renal origin: Secondary | ICD-10-CM | POA: Diagnosis not present

## 2015-11-17 DIAGNOSIS — N186 End stage renal disease: Secondary | ICD-10-CM | POA: Diagnosis not present

## 2015-11-17 DIAGNOSIS — D509 Iron deficiency anemia, unspecified: Secondary | ICD-10-CM | POA: Diagnosis not present

## 2015-11-17 DIAGNOSIS — N2581 Secondary hyperparathyroidism of renal origin: Secondary | ICD-10-CM | POA: Diagnosis not present

## 2015-11-20 DIAGNOSIS — D509 Iron deficiency anemia, unspecified: Secondary | ICD-10-CM | POA: Diagnosis not present

## 2015-11-20 DIAGNOSIS — N186 End stage renal disease: Secondary | ICD-10-CM | POA: Diagnosis not present

## 2015-11-20 DIAGNOSIS — N2581 Secondary hyperparathyroidism of renal origin: Secondary | ICD-10-CM | POA: Diagnosis not present

## 2015-11-22 DIAGNOSIS — D509 Iron deficiency anemia, unspecified: Secondary | ICD-10-CM | POA: Diagnosis not present

## 2015-11-22 DIAGNOSIS — N2581 Secondary hyperparathyroidism of renal origin: Secondary | ICD-10-CM | POA: Diagnosis not present

## 2015-11-22 DIAGNOSIS — N186 End stage renal disease: Secondary | ICD-10-CM | POA: Diagnosis not present

## 2015-11-23 DIAGNOSIS — D509 Iron deficiency anemia, unspecified: Secondary | ICD-10-CM | POA: Diagnosis not present

## 2015-11-23 DIAGNOSIS — N186 End stage renal disease: Secondary | ICD-10-CM | POA: Diagnosis not present

## 2015-11-23 DIAGNOSIS — N2581 Secondary hyperparathyroidism of renal origin: Secondary | ICD-10-CM | POA: Diagnosis not present

## 2015-11-24 DIAGNOSIS — S8001XA Contusion of right knee, initial encounter: Secondary | ICD-10-CM | POA: Diagnosis not present

## 2015-11-24 DIAGNOSIS — S59901A Unspecified injury of right elbow, initial encounter: Secondary | ICD-10-CM | POA: Diagnosis not present

## 2015-11-24 DIAGNOSIS — M25561 Pain in right knee: Secondary | ICD-10-CM | POA: Diagnosis not present

## 2015-11-24 DIAGNOSIS — Z992 Dependence on renal dialysis: Secondary | ICD-10-CM | POA: Diagnosis not present

## 2015-11-24 DIAGNOSIS — N289 Disorder of kidney and ureter, unspecified: Secondary | ICD-10-CM | POA: Diagnosis not present

## 2015-11-24 DIAGNOSIS — M25521 Pain in right elbow: Secondary | ICD-10-CM | POA: Diagnosis not present

## 2015-11-24 DIAGNOSIS — S5001XA Contusion of right elbow, initial encounter: Secondary | ICD-10-CM | POA: Diagnosis not present

## 2015-11-25 DIAGNOSIS — M25561 Pain in right knee: Secondary | ICD-10-CM | POA: Diagnosis not present

## 2015-11-25 DIAGNOSIS — S59901A Unspecified injury of right elbow, initial encounter: Secondary | ICD-10-CM | POA: Diagnosis not present

## 2015-11-27 ENCOUNTER — Emergency Department (INDEPENDENT_AMBULATORY_CARE_PROVIDER_SITE_OTHER)
Admission: EM | Admit: 2015-11-27 | Discharge: 2015-11-27 | Disposition: A | Discharge status: Home / Self Care | Payer: Medicare Other | Source: Home / Self Care | Attending: Emergency Medicine | Admitting: Emergency Medicine

## 2015-11-27 ENCOUNTER — Encounter (HOSPITAL_COMMUNITY): Payer: Self-pay | Admitting: Emergency Medicine

## 2015-11-27 DIAGNOSIS — R519 Headache, unspecified: Secondary | ICD-10-CM

## 2015-11-27 DIAGNOSIS — N033 Chronic nephritic syndrome with diffuse mesangial proliferative glomerulonephritis: Secondary | ICD-10-CM | POA: Diagnosis not present

## 2015-11-27 DIAGNOSIS — Z992 Dependence on renal dialysis: Secondary | ICD-10-CM | POA: Diagnosis not present

## 2015-11-27 DIAGNOSIS — N186 End stage renal disease: Secondary | ICD-10-CM | POA: Diagnosis not present

## 2015-11-27 DIAGNOSIS — R51 Headache: Secondary | ICD-10-CM

## 2015-11-27 MED ORDER — KETOROLAC TROMETHAMINE 30 MG/ML IJ SOLN
INTRAMUSCULAR | Status: AC
  Filled 2015-11-27: qty 1

## 2015-11-27 MED ORDER — KETOROLAC TROMETHAMINE 30 MG/ML IJ SOLN
30.0000 mg | Freq: Once | INTRAMUSCULAR | Status: AC
  Administered 2015-11-27: 30 mg via INTRAMUSCULAR

## 2015-11-27 MED ORDER — TRAMADOL HCL 50 MG PO TABS: 50.0000 mg | ORAL_TABLET | Freq: Four times a day (QID) | ORAL | Status: DC | PRN
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
## 2015-11-27 NOTE — ED Notes (Signed)
Called in the waiting room no answer
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
## 2015-11-27 NOTE — ED Notes (Signed)
Headache since Saturday, 2/25

## 2015-11-28 DIAGNOSIS — D509 Iron deficiency anemia, unspecified: Secondary | ICD-10-CM | POA: Diagnosis not present

## 2015-11-28 DIAGNOSIS — N186 End stage renal disease: Secondary | ICD-10-CM | POA: Diagnosis not present

## 2015-11-28 DIAGNOSIS — N2581 Secondary hyperparathyroidism of renal origin: Secondary | ICD-10-CM | POA: Diagnosis not present

## 2015-11-28 DIAGNOSIS — D631 Anemia in chronic kidney disease: Secondary | ICD-10-CM | POA: Diagnosis not present

## 2015-11-29 DIAGNOSIS — D509 Iron deficiency anemia, unspecified: Secondary | ICD-10-CM | POA: Diagnosis not present

## 2015-11-29 DIAGNOSIS — N186 End stage renal disease: Secondary | ICD-10-CM | POA: Diagnosis not present

## 2015-11-29 DIAGNOSIS — D631 Anemia in chronic kidney disease: Secondary | ICD-10-CM | POA: Diagnosis not present

## 2015-11-29 DIAGNOSIS — N2581 Secondary hyperparathyroidism of renal origin: Secondary | ICD-10-CM | POA: Diagnosis not present

## 2015-12-01 DIAGNOSIS — N186 End stage renal disease: Secondary | ICD-10-CM | POA: Diagnosis not present

## 2015-12-01 DIAGNOSIS — D631 Anemia in chronic kidney disease: Secondary | ICD-10-CM | POA: Diagnosis not present

## 2015-12-01 DIAGNOSIS — N2581 Secondary hyperparathyroidism of renal origin: Secondary | ICD-10-CM | POA: Diagnosis not present

## 2015-12-01 DIAGNOSIS — D509 Iron deficiency anemia, unspecified: Secondary | ICD-10-CM | POA: Diagnosis not present

## 2015-12-04 DIAGNOSIS — D631 Anemia in chronic kidney disease: Secondary | ICD-10-CM | POA: Diagnosis not present

## 2015-12-04 DIAGNOSIS — N186 End stage renal disease: Secondary | ICD-10-CM | POA: Diagnosis not present

## 2015-12-04 DIAGNOSIS — D509 Iron deficiency anemia, unspecified: Secondary | ICD-10-CM | POA: Diagnosis not present

## 2015-12-04 DIAGNOSIS — N2581 Secondary hyperparathyroidism of renal origin: Secondary | ICD-10-CM | POA: Diagnosis not present

## 2015-12-06 DIAGNOSIS — D509 Iron deficiency anemia, unspecified: Secondary | ICD-10-CM | POA: Diagnosis not present

## 2015-12-06 DIAGNOSIS — N186 End stage renal disease: Secondary | ICD-10-CM | POA: Diagnosis not present

## 2015-12-06 DIAGNOSIS — N2581 Secondary hyperparathyroidism of renal origin: Secondary | ICD-10-CM | POA: Diagnosis not present

## 2015-12-06 DIAGNOSIS — D631 Anemia in chronic kidney disease: Secondary | ICD-10-CM | POA: Diagnosis not present

## 2015-12-08 DIAGNOSIS — N2581 Secondary hyperparathyroidism of renal origin: Secondary | ICD-10-CM | POA: Diagnosis not present

## 2015-12-08 DIAGNOSIS — N186 End stage renal disease: Secondary | ICD-10-CM | POA: Diagnosis not present

## 2015-12-08 DIAGNOSIS — D509 Iron deficiency anemia, unspecified: Secondary | ICD-10-CM | POA: Diagnosis not present

## 2015-12-08 DIAGNOSIS — D631 Anemia in chronic kidney disease: Secondary | ICD-10-CM | POA: Diagnosis not present

## 2015-12-11 DIAGNOSIS — N186 End stage renal disease: Secondary | ICD-10-CM | POA: Diagnosis not present

## 2015-12-11 DIAGNOSIS — D509 Iron deficiency anemia, unspecified: Secondary | ICD-10-CM | POA: Diagnosis not present

## 2015-12-11 DIAGNOSIS — D631 Anemia in chronic kidney disease: Secondary | ICD-10-CM | POA: Diagnosis not present

## 2015-12-11 DIAGNOSIS — N2581 Secondary hyperparathyroidism of renal origin: Secondary | ICD-10-CM | POA: Diagnosis not present

## 2015-12-13 DIAGNOSIS — D631 Anemia in chronic kidney disease: Secondary | ICD-10-CM | POA: Diagnosis not present

## 2015-12-13 DIAGNOSIS — N2581 Secondary hyperparathyroidism of renal origin: Secondary | ICD-10-CM | POA: Diagnosis not present

## 2015-12-13 DIAGNOSIS — N186 End stage renal disease: Secondary | ICD-10-CM | POA: Diagnosis not present

## 2015-12-13 DIAGNOSIS — D509 Iron deficiency anemia, unspecified: Secondary | ICD-10-CM | POA: Diagnosis not present

## 2015-12-15 DIAGNOSIS — D509 Iron deficiency anemia, unspecified: Secondary | ICD-10-CM | POA: Diagnosis not present

## 2015-12-15 DIAGNOSIS — D631 Anemia in chronic kidney disease: Secondary | ICD-10-CM | POA: Diagnosis not present

## 2015-12-15 DIAGNOSIS — N2581 Secondary hyperparathyroidism of renal origin: Secondary | ICD-10-CM | POA: Diagnosis not present

## 2015-12-15 DIAGNOSIS — N186 End stage renal disease: Secondary | ICD-10-CM | POA: Diagnosis not present

## 2015-12-18 DIAGNOSIS — N186 End stage renal disease: Secondary | ICD-10-CM | POA: Diagnosis not present

## 2015-12-18 DIAGNOSIS — D509 Iron deficiency anemia, unspecified: Secondary | ICD-10-CM | POA: Diagnosis not present

## 2015-12-18 DIAGNOSIS — D631 Anemia in chronic kidney disease: Secondary | ICD-10-CM | POA: Diagnosis not present

## 2015-12-18 DIAGNOSIS — N2581 Secondary hyperparathyroidism of renal origin: Secondary | ICD-10-CM | POA: Diagnosis not present

## 2015-12-19 DIAGNOSIS — L700 Acne vulgaris: Secondary | ICD-10-CM | POA: Diagnosis not present

## 2015-12-20 DIAGNOSIS — D631 Anemia in chronic kidney disease: Secondary | ICD-10-CM | POA: Diagnosis not present

## 2015-12-20 DIAGNOSIS — D509 Iron deficiency anemia, unspecified: Secondary | ICD-10-CM | POA: Diagnosis not present

## 2015-12-20 DIAGNOSIS — N2581 Secondary hyperparathyroidism of renal origin: Secondary | ICD-10-CM | POA: Diagnosis not present

## 2015-12-20 DIAGNOSIS — N186 End stage renal disease: Secondary | ICD-10-CM | POA: Diagnosis not present

## 2015-12-25 DIAGNOSIS — N2581 Secondary hyperparathyroidism of renal origin: Secondary | ICD-10-CM | POA: Diagnosis not present

## 2015-12-25 DIAGNOSIS — D631 Anemia in chronic kidney disease: Secondary | ICD-10-CM | POA: Diagnosis not present

## 2015-12-25 DIAGNOSIS — N186 End stage renal disease: Secondary | ICD-10-CM | POA: Diagnosis not present

## 2015-12-25 DIAGNOSIS — D509 Iron deficiency anemia, unspecified: Secondary | ICD-10-CM | POA: Diagnosis not present

## 2015-12-27 DIAGNOSIS — D631 Anemia in chronic kidney disease: Secondary | ICD-10-CM | POA: Diagnosis not present

## 2015-12-27 DIAGNOSIS — N2581 Secondary hyperparathyroidism of renal origin: Secondary | ICD-10-CM | POA: Diagnosis not present

## 2015-12-27 DIAGNOSIS — N186 End stage renal disease: Secondary | ICD-10-CM | POA: Diagnosis not present

## 2015-12-27 DIAGNOSIS — D509 Iron deficiency anemia, unspecified: Secondary | ICD-10-CM | POA: Diagnosis not present

## 2015-12-28 DIAGNOSIS — N033 Chronic nephritic syndrome with diffuse mesangial proliferative glomerulonephritis: Secondary | ICD-10-CM | POA: Diagnosis not present

## 2015-12-28 DIAGNOSIS — Z992 Dependence on renal dialysis: Secondary | ICD-10-CM | POA: Diagnosis not present

## 2015-12-28 DIAGNOSIS — N186 End stage renal disease: Secondary | ICD-10-CM | POA: Diagnosis not present

## 2015-12-29 DIAGNOSIS — N2581 Secondary hyperparathyroidism of renal origin: Secondary | ICD-10-CM | POA: Diagnosis not present

## 2015-12-29 DIAGNOSIS — D631 Anemia in chronic kidney disease: Secondary | ICD-10-CM | POA: Diagnosis not present

## 2015-12-29 DIAGNOSIS — D509 Iron deficiency anemia, unspecified: Secondary | ICD-10-CM | POA: Diagnosis not present

## 2015-12-29 DIAGNOSIS — N186 End stage renal disease: Secondary | ICD-10-CM | POA: Diagnosis not present

## 2016-01-01 DIAGNOSIS — N2581 Secondary hyperparathyroidism of renal origin: Secondary | ICD-10-CM | POA: Diagnosis not present

## 2016-01-01 DIAGNOSIS — N186 End stage renal disease: Secondary | ICD-10-CM | POA: Diagnosis not present

## 2016-01-01 DIAGNOSIS — D631 Anemia in chronic kidney disease: Secondary | ICD-10-CM | POA: Diagnosis not present

## 2016-01-01 DIAGNOSIS — D509 Iron deficiency anemia, unspecified: Secondary | ICD-10-CM | POA: Diagnosis not present

## 2016-01-03 DIAGNOSIS — N186 End stage renal disease: Secondary | ICD-10-CM | POA: Diagnosis not present

## 2016-01-03 DIAGNOSIS — N2581 Secondary hyperparathyroidism of renal origin: Secondary | ICD-10-CM | POA: Diagnosis not present

## 2016-01-03 DIAGNOSIS — D509 Iron deficiency anemia, unspecified: Secondary | ICD-10-CM | POA: Diagnosis not present

## 2016-01-03 DIAGNOSIS — D631 Anemia in chronic kidney disease: Secondary | ICD-10-CM | POA: Diagnosis not present

## 2016-01-05 DIAGNOSIS — N186 End stage renal disease: Secondary | ICD-10-CM | POA: Diagnosis not present

## 2016-01-05 DIAGNOSIS — N2581 Secondary hyperparathyroidism of renal origin: Secondary | ICD-10-CM | POA: Diagnosis not present

## 2016-01-05 DIAGNOSIS — D631 Anemia in chronic kidney disease: Secondary | ICD-10-CM | POA: Diagnosis not present

## 2016-01-05 DIAGNOSIS — D509 Iron deficiency anemia, unspecified: Secondary | ICD-10-CM | POA: Diagnosis not present

## 2016-01-08 DIAGNOSIS — D509 Iron deficiency anemia, unspecified: Secondary | ICD-10-CM | POA: Diagnosis not present

## 2016-01-08 DIAGNOSIS — N186 End stage renal disease: Secondary | ICD-10-CM | POA: Diagnosis not present

## 2016-01-08 DIAGNOSIS — D631 Anemia in chronic kidney disease: Secondary | ICD-10-CM | POA: Diagnosis not present

## 2016-01-08 DIAGNOSIS — N2581 Secondary hyperparathyroidism of renal origin: Secondary | ICD-10-CM | POA: Diagnosis not present

## 2016-01-10 DIAGNOSIS — D509 Iron deficiency anemia, unspecified: Secondary | ICD-10-CM | POA: Diagnosis not present

## 2016-01-10 DIAGNOSIS — N2581 Secondary hyperparathyroidism of renal origin: Secondary | ICD-10-CM | POA: Diagnosis not present

## 2016-01-10 DIAGNOSIS — D631 Anemia in chronic kidney disease: Secondary | ICD-10-CM | POA: Diagnosis not present

## 2016-01-10 DIAGNOSIS — N186 End stage renal disease: Secondary | ICD-10-CM | POA: Diagnosis not present

## 2016-01-12 DIAGNOSIS — D509 Iron deficiency anemia, unspecified: Secondary | ICD-10-CM | POA: Diagnosis not present

## 2016-01-12 DIAGNOSIS — N2581 Secondary hyperparathyroidism of renal origin: Secondary | ICD-10-CM | POA: Diagnosis not present

## 2016-01-12 DIAGNOSIS — D631 Anemia in chronic kidney disease: Secondary | ICD-10-CM | POA: Diagnosis not present

## 2016-01-12 DIAGNOSIS — N186 End stage renal disease: Secondary | ICD-10-CM | POA: Diagnosis not present

## 2016-01-15 DIAGNOSIS — N2581 Secondary hyperparathyroidism of renal origin: Secondary | ICD-10-CM | POA: Diagnosis not present

## 2016-01-15 DIAGNOSIS — N186 End stage renal disease: Secondary | ICD-10-CM | POA: Diagnosis not present

## 2016-01-15 DIAGNOSIS — D631 Anemia in chronic kidney disease: Secondary | ICD-10-CM | POA: Diagnosis not present

## 2016-01-15 DIAGNOSIS — D509 Iron deficiency anemia, unspecified: Secondary | ICD-10-CM | POA: Diagnosis not present

## 2016-01-18 DIAGNOSIS — N186 End stage renal disease: Secondary | ICD-10-CM | POA: Diagnosis not present

## 2016-01-18 DIAGNOSIS — D631 Anemia in chronic kidney disease: Secondary | ICD-10-CM | POA: Diagnosis not present

## 2016-01-18 DIAGNOSIS — N2581 Secondary hyperparathyroidism of renal origin: Secondary | ICD-10-CM | POA: Diagnosis not present

## 2016-01-18 DIAGNOSIS — D509 Iron deficiency anemia, unspecified: Secondary | ICD-10-CM | POA: Diagnosis not present

## 2016-01-19 DIAGNOSIS — N186 End stage renal disease: Secondary | ICD-10-CM | POA: Diagnosis not present

## 2016-01-19 DIAGNOSIS — D631 Anemia in chronic kidney disease: Secondary | ICD-10-CM | POA: Diagnosis not present

## 2016-01-19 DIAGNOSIS — D509 Iron deficiency anemia, unspecified: Secondary | ICD-10-CM | POA: Diagnosis not present

## 2016-01-19 DIAGNOSIS — N2581 Secondary hyperparathyroidism of renal origin: Secondary | ICD-10-CM | POA: Diagnosis not present

## 2016-01-22 DIAGNOSIS — D631 Anemia in chronic kidney disease: Secondary | ICD-10-CM | POA: Diagnosis not present

## 2016-01-22 DIAGNOSIS — N186 End stage renal disease: Secondary | ICD-10-CM | POA: Diagnosis not present

## 2016-01-22 DIAGNOSIS — N2581 Secondary hyperparathyroidism of renal origin: Secondary | ICD-10-CM | POA: Diagnosis not present

## 2016-01-22 DIAGNOSIS — D509 Iron deficiency anemia, unspecified: Secondary | ICD-10-CM | POA: Diagnosis not present

## 2016-01-25 DIAGNOSIS — N186 End stage renal disease: Secondary | ICD-10-CM | POA: Diagnosis not present

## 2016-01-25 DIAGNOSIS — D631 Anemia in chronic kidney disease: Secondary | ICD-10-CM | POA: Diagnosis not present

## 2016-01-25 DIAGNOSIS — D509 Iron deficiency anemia, unspecified: Secondary | ICD-10-CM | POA: Diagnosis not present

## 2016-01-25 DIAGNOSIS — N2581 Secondary hyperparathyroidism of renal origin: Secondary | ICD-10-CM | POA: Diagnosis not present

## 2016-01-26 DIAGNOSIS — N186 End stage renal disease: Secondary | ICD-10-CM | POA: Diagnosis not present

## 2016-01-26 DIAGNOSIS — D631 Anemia in chronic kidney disease: Secondary | ICD-10-CM | POA: Diagnosis not present

## 2016-01-26 DIAGNOSIS — D509 Iron deficiency anemia, unspecified: Secondary | ICD-10-CM | POA: Diagnosis not present

## 2016-01-26 DIAGNOSIS — N2581 Secondary hyperparathyroidism of renal origin: Secondary | ICD-10-CM | POA: Diagnosis not present

## 2016-01-30 DIAGNOSIS — N186 End stage renal disease: Secondary | ICD-10-CM | POA: Diagnosis not present

## 2016-01-30 DIAGNOSIS — N2581 Secondary hyperparathyroidism of renal origin: Secondary | ICD-10-CM | POA: Diagnosis not present

## 2016-01-30 DIAGNOSIS — D509 Iron deficiency anemia, unspecified: Secondary | ICD-10-CM | POA: Diagnosis not present

## 2016-01-31 DIAGNOSIS — N186 End stage renal disease: Secondary | ICD-10-CM | POA: Diagnosis not present

## 2016-01-31 DIAGNOSIS — N2581 Secondary hyperparathyroidism of renal origin: Secondary | ICD-10-CM | POA: Diagnosis not present

## 2016-01-31 DIAGNOSIS — D509 Iron deficiency anemia, unspecified: Secondary | ICD-10-CM | POA: Diagnosis not present

## 2016-02-02 DIAGNOSIS — N186 End stage renal disease: Secondary | ICD-10-CM | POA: Diagnosis not present

## 2016-02-02 DIAGNOSIS — D509 Iron deficiency anemia, unspecified: Secondary | ICD-10-CM | POA: Diagnosis not present

## 2016-02-02 DIAGNOSIS — N2581 Secondary hyperparathyroidism of renal origin: Secondary | ICD-10-CM | POA: Diagnosis not present

## 2016-02-05 DIAGNOSIS — N186 End stage renal disease: Secondary | ICD-10-CM | POA: Diagnosis not present

## 2016-02-05 DIAGNOSIS — N2581 Secondary hyperparathyroidism of renal origin: Secondary | ICD-10-CM | POA: Diagnosis not present

## 2016-02-05 DIAGNOSIS — D509 Iron deficiency anemia, unspecified: Secondary | ICD-10-CM | POA: Diagnosis not present

## 2016-02-07 DIAGNOSIS — D509 Iron deficiency anemia, unspecified: Secondary | ICD-10-CM | POA: Diagnosis not present

## 2016-02-07 DIAGNOSIS — N2581 Secondary hyperparathyroidism of renal origin: Secondary | ICD-10-CM | POA: Diagnosis not present

## 2016-02-07 DIAGNOSIS — N186 End stage renal disease: Secondary | ICD-10-CM | POA: Diagnosis not present

## 2016-02-08 DIAGNOSIS — N2581 Secondary hyperparathyroidism of renal origin: Secondary | ICD-10-CM | POA: Diagnosis not present

## 2016-02-08 DIAGNOSIS — D509 Iron deficiency anemia, unspecified: Secondary | ICD-10-CM | POA: Diagnosis not present

## 2016-02-08 DIAGNOSIS — N186 End stage renal disease: Secondary | ICD-10-CM | POA: Diagnosis not present

## 2016-02-13 DIAGNOSIS — N2581 Secondary hyperparathyroidism of renal origin: Secondary | ICD-10-CM | POA: Diagnosis not present

## 2016-02-13 DIAGNOSIS — N186 End stage renal disease: Secondary | ICD-10-CM | POA: Diagnosis not present

## 2016-02-13 DIAGNOSIS — D509 Iron deficiency anemia, unspecified: Secondary | ICD-10-CM | POA: Diagnosis not present

## 2016-02-14 DIAGNOSIS — D509 Iron deficiency anemia, unspecified: Secondary | ICD-10-CM | POA: Diagnosis not present

## 2016-02-14 DIAGNOSIS — N186 End stage renal disease: Secondary | ICD-10-CM | POA: Diagnosis not present

## 2016-02-14 DIAGNOSIS — N2581 Secondary hyperparathyroidism of renal origin: Secondary | ICD-10-CM | POA: Diagnosis not present

## 2016-02-16 DIAGNOSIS — N186 End stage renal disease: Secondary | ICD-10-CM | POA: Diagnosis not present

## 2016-02-16 DIAGNOSIS — D509 Iron deficiency anemia, unspecified: Secondary | ICD-10-CM | POA: Diagnosis not present

## 2016-02-16 DIAGNOSIS — N2581 Secondary hyperparathyroidism of renal origin: Secondary | ICD-10-CM | POA: Diagnosis not present

## 2016-02-19 DIAGNOSIS — D509 Iron deficiency anemia, unspecified: Secondary | ICD-10-CM | POA: Diagnosis not present

## 2016-02-19 DIAGNOSIS — N2581 Secondary hyperparathyroidism of renal origin: Secondary | ICD-10-CM | POA: Diagnosis not present

## 2016-02-19 DIAGNOSIS — N186 End stage renal disease: Secondary | ICD-10-CM | POA: Diagnosis not present

## 2016-02-21 DIAGNOSIS — N186 End stage renal disease: Secondary | ICD-10-CM | POA: Diagnosis not present

## 2016-02-21 DIAGNOSIS — D509 Iron deficiency anemia, unspecified: Secondary | ICD-10-CM | POA: Diagnosis not present

## 2016-02-21 DIAGNOSIS — N2581 Secondary hyperparathyroidism of renal origin: Secondary | ICD-10-CM | POA: Diagnosis not present

## 2016-02-23 DIAGNOSIS — N2581 Secondary hyperparathyroidism of renal origin: Secondary | ICD-10-CM | POA: Diagnosis not present

## 2016-02-23 DIAGNOSIS — D509 Iron deficiency anemia, unspecified: Secondary | ICD-10-CM | POA: Diagnosis not present

## 2016-02-23 DIAGNOSIS — N186 End stage renal disease: Secondary | ICD-10-CM | POA: Diagnosis not present

## 2016-02-26 DIAGNOSIS — N2581 Secondary hyperparathyroidism of renal origin: Secondary | ICD-10-CM | POA: Diagnosis not present

## 2016-02-26 DIAGNOSIS — D509 Iron deficiency anemia, unspecified: Secondary | ICD-10-CM | POA: Diagnosis not present

## 2016-02-26 DIAGNOSIS — N186 End stage renal disease: Secondary | ICD-10-CM | POA: Diagnosis not present

## 2016-02-27 DIAGNOSIS — N186 End stage renal disease: Secondary | ICD-10-CM | POA: Diagnosis not present

## 2016-02-27 DIAGNOSIS — Z992 Dependence on renal dialysis: Secondary | ICD-10-CM | POA: Diagnosis not present

## 2016-02-27 DIAGNOSIS — N033 Chronic nephritic syndrome with diffuse mesangial proliferative glomerulonephritis: Secondary | ICD-10-CM | POA: Diagnosis not present

## 2016-02-28 DIAGNOSIS — D509 Iron deficiency anemia, unspecified: Secondary | ICD-10-CM | POA: Diagnosis not present

## 2016-02-28 DIAGNOSIS — N186 End stage renal disease: Secondary | ICD-10-CM | POA: Diagnosis not present

## 2016-02-28 DIAGNOSIS — D631 Anemia in chronic kidney disease: Secondary | ICD-10-CM | POA: Diagnosis not present

## 2016-02-28 DIAGNOSIS — N2581 Secondary hyperparathyroidism of renal origin: Secondary | ICD-10-CM | POA: Diagnosis not present

## 2016-03-01 DIAGNOSIS — D631 Anemia in chronic kidney disease: Secondary | ICD-10-CM | POA: Diagnosis not present

## 2016-03-01 DIAGNOSIS — N186 End stage renal disease: Secondary | ICD-10-CM | POA: Diagnosis not present

## 2016-03-01 DIAGNOSIS — N2581 Secondary hyperparathyroidism of renal origin: Secondary | ICD-10-CM | POA: Diagnosis not present

## 2016-03-01 DIAGNOSIS — D509 Iron deficiency anemia, unspecified: Secondary | ICD-10-CM | POA: Diagnosis not present

## 2016-03-04 DIAGNOSIS — N2581 Secondary hyperparathyroidism of renal origin: Secondary | ICD-10-CM | POA: Diagnosis not present

## 2016-03-04 DIAGNOSIS — D631 Anemia in chronic kidney disease: Secondary | ICD-10-CM | POA: Diagnosis not present

## 2016-03-04 DIAGNOSIS — N186 End stage renal disease: Secondary | ICD-10-CM | POA: Diagnosis not present

## 2016-03-04 DIAGNOSIS — D509 Iron deficiency anemia, unspecified: Secondary | ICD-10-CM | POA: Diagnosis not present

## 2016-03-06 DIAGNOSIS — N186 End stage renal disease: Secondary | ICD-10-CM | POA: Diagnosis not present

## 2016-03-06 DIAGNOSIS — D631 Anemia in chronic kidney disease: Secondary | ICD-10-CM | POA: Diagnosis not present

## 2016-03-06 DIAGNOSIS — N2581 Secondary hyperparathyroidism of renal origin: Secondary | ICD-10-CM | POA: Diagnosis not present

## 2016-03-06 DIAGNOSIS — D509 Iron deficiency anemia, unspecified: Secondary | ICD-10-CM | POA: Diagnosis not present

## 2016-03-08 DIAGNOSIS — D631 Anemia in chronic kidney disease: Secondary | ICD-10-CM | POA: Diagnosis not present

## 2016-03-08 DIAGNOSIS — D509 Iron deficiency anemia, unspecified: Secondary | ICD-10-CM | POA: Diagnosis not present

## 2016-03-08 DIAGNOSIS — N2581 Secondary hyperparathyroidism of renal origin: Secondary | ICD-10-CM | POA: Diagnosis not present

## 2016-03-08 DIAGNOSIS — N186 End stage renal disease: Secondary | ICD-10-CM | POA: Diagnosis not present

## 2016-03-11 DIAGNOSIS — D631 Anemia in chronic kidney disease: Secondary | ICD-10-CM | POA: Diagnosis not present

## 2016-03-11 DIAGNOSIS — N186 End stage renal disease: Secondary | ICD-10-CM | POA: Diagnosis not present

## 2016-03-11 DIAGNOSIS — D509 Iron deficiency anemia, unspecified: Secondary | ICD-10-CM | POA: Diagnosis not present

## 2016-03-11 DIAGNOSIS — N2581 Secondary hyperparathyroidism of renal origin: Secondary | ICD-10-CM | POA: Diagnosis not present

## 2016-03-13 DIAGNOSIS — D631 Anemia in chronic kidney disease: Secondary | ICD-10-CM | POA: Diagnosis not present

## 2016-03-13 DIAGNOSIS — N2581 Secondary hyperparathyroidism of renal origin: Secondary | ICD-10-CM | POA: Diagnosis not present

## 2016-03-13 DIAGNOSIS — N186 End stage renal disease: Secondary | ICD-10-CM | POA: Diagnosis not present

## 2016-03-13 DIAGNOSIS — D509 Iron deficiency anemia, unspecified: Secondary | ICD-10-CM | POA: Diagnosis not present

## 2016-03-15 DIAGNOSIS — N2581 Secondary hyperparathyroidism of renal origin: Secondary | ICD-10-CM | POA: Diagnosis not present

## 2016-03-15 DIAGNOSIS — D631 Anemia in chronic kidney disease: Secondary | ICD-10-CM | POA: Diagnosis not present

## 2016-03-15 DIAGNOSIS — D509 Iron deficiency anemia, unspecified: Secondary | ICD-10-CM | POA: Diagnosis not present

## 2016-03-15 DIAGNOSIS — N186 End stage renal disease: Secondary | ICD-10-CM | POA: Diagnosis not present

## 2016-03-18 DIAGNOSIS — D509 Iron deficiency anemia, unspecified: Secondary | ICD-10-CM | POA: Diagnosis not present

## 2016-03-18 DIAGNOSIS — N186 End stage renal disease: Secondary | ICD-10-CM | POA: Diagnosis not present

## 2016-03-18 DIAGNOSIS — D631 Anemia in chronic kidney disease: Secondary | ICD-10-CM | POA: Diagnosis not present

## 2016-03-18 DIAGNOSIS — N2581 Secondary hyperparathyroidism of renal origin: Secondary | ICD-10-CM | POA: Diagnosis not present

## 2016-03-20 DIAGNOSIS — D631 Anemia in chronic kidney disease: Secondary | ICD-10-CM | POA: Diagnosis not present

## 2016-03-20 DIAGNOSIS — N186 End stage renal disease: Secondary | ICD-10-CM | POA: Diagnosis not present

## 2016-03-20 DIAGNOSIS — N2581 Secondary hyperparathyroidism of renal origin: Secondary | ICD-10-CM | POA: Diagnosis not present

## 2016-03-20 DIAGNOSIS — D509 Iron deficiency anemia, unspecified: Secondary | ICD-10-CM | POA: Diagnosis not present

## 2016-03-22 DIAGNOSIS — N186 End stage renal disease: Secondary | ICD-10-CM | POA: Diagnosis not present

## 2016-03-22 DIAGNOSIS — D509 Iron deficiency anemia, unspecified: Secondary | ICD-10-CM | POA: Diagnosis not present

## 2016-03-22 DIAGNOSIS — N2581 Secondary hyperparathyroidism of renal origin: Secondary | ICD-10-CM | POA: Diagnosis not present

## 2016-03-22 DIAGNOSIS — D631 Anemia in chronic kidney disease: Secondary | ICD-10-CM | POA: Diagnosis not present

## 2016-03-25 DIAGNOSIS — D509 Iron deficiency anemia, unspecified: Secondary | ICD-10-CM | POA: Diagnosis not present

## 2016-03-25 DIAGNOSIS — N2581 Secondary hyperparathyroidism of renal origin: Secondary | ICD-10-CM | POA: Diagnosis not present

## 2016-03-25 DIAGNOSIS — N186 End stage renal disease: Secondary | ICD-10-CM | POA: Diagnosis not present

## 2016-03-25 DIAGNOSIS — D631 Anemia in chronic kidney disease: Secondary | ICD-10-CM | POA: Diagnosis not present

## 2016-03-27 DIAGNOSIS — D631 Anemia in chronic kidney disease: Secondary | ICD-10-CM | POA: Diagnosis not present

## 2016-03-27 DIAGNOSIS — D509 Iron deficiency anemia, unspecified: Secondary | ICD-10-CM | POA: Diagnosis not present

## 2016-03-27 DIAGNOSIS — N186 End stage renal disease: Secondary | ICD-10-CM | POA: Diagnosis not present

## 2016-03-27 DIAGNOSIS — N2581 Secondary hyperparathyroidism of renal origin: Secondary | ICD-10-CM | POA: Diagnosis not present

## 2016-03-28 DIAGNOSIS — Z992 Dependence on renal dialysis: Secondary | ICD-10-CM | POA: Diagnosis not present

## 2016-03-28 DIAGNOSIS — N186 End stage renal disease: Secondary | ICD-10-CM | POA: Diagnosis not present

## 2016-03-28 DIAGNOSIS — N033 Chronic nephritic syndrome with diffuse mesangial proliferative glomerulonephritis: Secondary | ICD-10-CM | POA: Diagnosis not present

## 2016-03-29 DIAGNOSIS — N186 End stage renal disease: Secondary | ICD-10-CM | POA: Diagnosis not present

## 2016-03-29 DIAGNOSIS — N2581 Secondary hyperparathyroidism of renal origin: Secondary | ICD-10-CM | POA: Diagnosis not present

## 2016-03-29 DIAGNOSIS — D509 Iron deficiency anemia, unspecified: Secondary | ICD-10-CM | POA: Diagnosis not present

## 2016-03-29 DIAGNOSIS — D631 Anemia in chronic kidney disease: Secondary | ICD-10-CM | POA: Diagnosis not present

## 2016-04-01 DIAGNOSIS — D509 Iron deficiency anemia, unspecified: Secondary | ICD-10-CM | POA: Diagnosis not present

## 2016-04-01 DIAGNOSIS — N2581 Secondary hyperparathyroidism of renal origin: Secondary | ICD-10-CM | POA: Diagnosis not present

## 2016-04-01 DIAGNOSIS — N186 End stage renal disease: Secondary | ICD-10-CM | POA: Diagnosis not present

## 2016-04-01 DIAGNOSIS — D631 Anemia in chronic kidney disease: Secondary | ICD-10-CM | POA: Diagnosis not present

## 2016-04-03 DIAGNOSIS — N186 End stage renal disease: Secondary | ICD-10-CM | POA: Diagnosis not present

## 2016-04-03 DIAGNOSIS — N2581 Secondary hyperparathyroidism of renal origin: Secondary | ICD-10-CM | POA: Diagnosis not present

## 2016-04-03 DIAGNOSIS — D631 Anemia in chronic kidney disease: Secondary | ICD-10-CM | POA: Diagnosis not present

## 2016-04-03 DIAGNOSIS — D509 Iron deficiency anemia, unspecified: Secondary | ICD-10-CM | POA: Diagnosis not present

## 2016-04-08 DIAGNOSIS — N2581 Secondary hyperparathyroidism of renal origin: Secondary | ICD-10-CM | POA: Diagnosis not present

## 2016-04-08 DIAGNOSIS — D509 Iron deficiency anemia, unspecified: Secondary | ICD-10-CM | POA: Diagnosis not present

## 2016-04-08 DIAGNOSIS — N186 End stage renal disease: Secondary | ICD-10-CM | POA: Diagnosis not present

## 2016-04-08 DIAGNOSIS — D631 Anemia in chronic kidney disease: Secondary | ICD-10-CM | POA: Diagnosis not present

## 2016-04-10 DIAGNOSIS — N2581 Secondary hyperparathyroidism of renal origin: Secondary | ICD-10-CM | POA: Diagnosis not present

## 2016-04-10 DIAGNOSIS — D509 Iron deficiency anemia, unspecified: Secondary | ICD-10-CM | POA: Diagnosis not present

## 2016-04-10 DIAGNOSIS — N186 End stage renal disease: Secondary | ICD-10-CM | POA: Diagnosis not present

## 2016-04-10 DIAGNOSIS — D631 Anemia in chronic kidney disease: Secondary | ICD-10-CM | POA: Diagnosis not present

## 2016-04-12 DIAGNOSIS — N186 End stage renal disease: Secondary | ICD-10-CM | POA: Diagnosis not present

## 2016-04-12 DIAGNOSIS — D509 Iron deficiency anemia, unspecified: Secondary | ICD-10-CM | POA: Diagnosis not present

## 2016-04-12 DIAGNOSIS — N2581 Secondary hyperparathyroidism of renal origin: Secondary | ICD-10-CM | POA: Diagnosis not present

## 2016-04-12 DIAGNOSIS — D631 Anemia in chronic kidney disease: Secondary | ICD-10-CM | POA: Diagnosis not present

## 2016-04-15 DIAGNOSIS — D509 Iron deficiency anemia, unspecified: Secondary | ICD-10-CM | POA: Diagnosis not present

## 2016-04-15 DIAGNOSIS — D631 Anemia in chronic kidney disease: Secondary | ICD-10-CM | POA: Diagnosis not present

## 2016-04-15 DIAGNOSIS — N2581 Secondary hyperparathyroidism of renal origin: Secondary | ICD-10-CM | POA: Diagnosis not present

## 2016-04-15 DIAGNOSIS — N186 End stage renal disease: Secondary | ICD-10-CM | POA: Diagnosis not present

## 2016-04-17 DIAGNOSIS — N2581 Secondary hyperparathyroidism of renal origin: Secondary | ICD-10-CM | POA: Diagnosis not present

## 2016-04-17 DIAGNOSIS — D631 Anemia in chronic kidney disease: Secondary | ICD-10-CM | POA: Diagnosis not present

## 2016-04-17 DIAGNOSIS — N186 End stage renal disease: Secondary | ICD-10-CM | POA: Diagnosis not present

## 2016-04-17 DIAGNOSIS — D509 Iron deficiency anemia, unspecified: Secondary | ICD-10-CM | POA: Diagnosis not present

## 2016-04-19 DIAGNOSIS — D509 Iron deficiency anemia, unspecified: Secondary | ICD-10-CM | POA: Diagnosis not present

## 2016-04-19 DIAGNOSIS — N186 End stage renal disease: Secondary | ICD-10-CM | POA: Diagnosis not present

## 2016-04-19 DIAGNOSIS — N2581 Secondary hyperparathyroidism of renal origin: Secondary | ICD-10-CM | POA: Diagnosis not present

## 2016-04-19 DIAGNOSIS — D631 Anemia in chronic kidney disease: Secondary | ICD-10-CM | POA: Diagnosis not present

## 2016-04-22 DIAGNOSIS — N186 End stage renal disease: Secondary | ICD-10-CM | POA: Diagnosis not present

## 2016-04-22 DIAGNOSIS — D509 Iron deficiency anemia, unspecified: Secondary | ICD-10-CM | POA: Diagnosis not present

## 2016-04-22 DIAGNOSIS — D631 Anemia in chronic kidney disease: Secondary | ICD-10-CM | POA: Diagnosis not present

## 2016-04-22 DIAGNOSIS — N2581 Secondary hyperparathyroidism of renal origin: Secondary | ICD-10-CM | POA: Diagnosis not present

## 2016-04-24 DIAGNOSIS — D509 Iron deficiency anemia, unspecified: Secondary | ICD-10-CM | POA: Diagnosis not present

## 2016-04-24 DIAGNOSIS — N2581 Secondary hyperparathyroidism of renal origin: Secondary | ICD-10-CM | POA: Diagnosis not present

## 2016-04-24 DIAGNOSIS — N186 End stage renal disease: Secondary | ICD-10-CM | POA: Diagnosis not present

## 2016-04-24 DIAGNOSIS — D631 Anemia in chronic kidney disease: Secondary | ICD-10-CM | POA: Diagnosis not present

## 2016-04-26 DIAGNOSIS — N2581 Secondary hyperparathyroidism of renal origin: Secondary | ICD-10-CM | POA: Diagnosis not present

## 2016-04-26 DIAGNOSIS — D631 Anemia in chronic kidney disease: Secondary | ICD-10-CM | POA: Diagnosis not present

## 2016-04-26 DIAGNOSIS — D509 Iron deficiency anemia, unspecified: Secondary | ICD-10-CM | POA: Diagnosis not present

## 2016-04-26 DIAGNOSIS — N186 End stage renal disease: Secondary | ICD-10-CM | POA: Diagnosis not present

## 2016-04-28 DIAGNOSIS — N033 Chronic nephritic syndrome with diffuse mesangial proliferative glomerulonephritis: Secondary | ICD-10-CM | POA: Diagnosis not present

## 2016-04-28 DIAGNOSIS — Z992 Dependence on renal dialysis: Secondary | ICD-10-CM | POA: Diagnosis not present

## 2016-04-28 DIAGNOSIS — N186 End stage renal disease: Secondary | ICD-10-CM | POA: Diagnosis not present

## 2016-04-29 DIAGNOSIS — N2581 Secondary hyperparathyroidism of renal origin: Secondary | ICD-10-CM | POA: Diagnosis not present

## 2016-04-29 DIAGNOSIS — D509 Iron deficiency anemia, unspecified: Secondary | ICD-10-CM | POA: Diagnosis not present

## 2016-04-29 DIAGNOSIS — D631 Anemia in chronic kidney disease: Secondary | ICD-10-CM | POA: Diagnosis not present

## 2016-04-29 DIAGNOSIS — N186 End stage renal disease: Secondary | ICD-10-CM | POA: Diagnosis not present

## 2016-05-01 DIAGNOSIS — N2581 Secondary hyperparathyroidism of renal origin: Secondary | ICD-10-CM | POA: Diagnosis not present

## 2016-05-01 DIAGNOSIS — D631 Anemia in chronic kidney disease: Secondary | ICD-10-CM | POA: Diagnosis not present

## 2016-05-01 DIAGNOSIS — N186 End stage renal disease: Secondary | ICD-10-CM | POA: Diagnosis not present

## 2016-05-01 DIAGNOSIS — D509 Iron deficiency anemia, unspecified: Secondary | ICD-10-CM | POA: Diagnosis not present

## 2016-05-03 DIAGNOSIS — D509 Iron deficiency anemia, unspecified: Secondary | ICD-10-CM | POA: Diagnosis not present

## 2016-05-03 DIAGNOSIS — N186 End stage renal disease: Secondary | ICD-10-CM | POA: Diagnosis not present

## 2016-05-03 DIAGNOSIS — D631 Anemia in chronic kidney disease: Secondary | ICD-10-CM | POA: Diagnosis not present

## 2016-05-03 DIAGNOSIS — N2581 Secondary hyperparathyroidism of renal origin: Secondary | ICD-10-CM | POA: Diagnosis not present

## 2016-05-06 DIAGNOSIS — N186 End stage renal disease: Secondary | ICD-10-CM | POA: Diagnosis not present

## 2016-05-06 DIAGNOSIS — D631 Anemia in chronic kidney disease: Secondary | ICD-10-CM | POA: Diagnosis not present

## 2016-05-06 DIAGNOSIS — N2581 Secondary hyperparathyroidism of renal origin: Secondary | ICD-10-CM | POA: Diagnosis not present

## 2016-05-06 DIAGNOSIS — D509 Iron deficiency anemia, unspecified: Secondary | ICD-10-CM | POA: Diagnosis not present

## 2016-05-08 DIAGNOSIS — N2581 Secondary hyperparathyroidism of renal origin: Secondary | ICD-10-CM | POA: Diagnosis not present

## 2016-05-08 DIAGNOSIS — N186 End stage renal disease: Secondary | ICD-10-CM | POA: Diagnosis not present

## 2016-05-08 DIAGNOSIS — D509 Iron deficiency anemia, unspecified: Secondary | ICD-10-CM | POA: Diagnosis not present

## 2016-05-08 DIAGNOSIS — D631 Anemia in chronic kidney disease: Secondary | ICD-10-CM | POA: Diagnosis not present

## 2016-05-10 DIAGNOSIS — N186 End stage renal disease: Secondary | ICD-10-CM | POA: Diagnosis not present

## 2016-05-10 DIAGNOSIS — N2581 Secondary hyperparathyroidism of renal origin: Secondary | ICD-10-CM | POA: Diagnosis not present

## 2016-05-10 DIAGNOSIS — D509 Iron deficiency anemia, unspecified: Secondary | ICD-10-CM | POA: Diagnosis not present

## 2016-05-10 DIAGNOSIS — D631 Anemia in chronic kidney disease: Secondary | ICD-10-CM | POA: Diagnosis not present

## 2016-05-13 DIAGNOSIS — D509 Iron deficiency anemia, unspecified: Secondary | ICD-10-CM | POA: Diagnosis not present

## 2016-05-13 DIAGNOSIS — N2581 Secondary hyperparathyroidism of renal origin: Secondary | ICD-10-CM | POA: Diagnosis not present

## 2016-05-13 DIAGNOSIS — N186 End stage renal disease: Secondary | ICD-10-CM | POA: Diagnosis not present

## 2016-05-13 DIAGNOSIS — D631 Anemia in chronic kidney disease: Secondary | ICD-10-CM | POA: Diagnosis not present

## 2016-05-15 DIAGNOSIS — N2581 Secondary hyperparathyroidism of renal origin: Secondary | ICD-10-CM | POA: Diagnosis not present

## 2016-05-15 DIAGNOSIS — D631 Anemia in chronic kidney disease: Secondary | ICD-10-CM | POA: Diagnosis not present

## 2016-05-15 DIAGNOSIS — N186 End stage renal disease: Secondary | ICD-10-CM | POA: Diagnosis not present

## 2016-05-15 DIAGNOSIS — D509 Iron deficiency anemia, unspecified: Secondary | ICD-10-CM | POA: Diagnosis not present

## 2016-05-16 DIAGNOSIS — D631 Anemia in chronic kidney disease: Secondary | ICD-10-CM | POA: Diagnosis not present

## 2016-05-16 DIAGNOSIS — H9011 Conductive hearing loss, unilateral, right ear, with unrestricted hearing on the contralateral side: Secondary | ICD-10-CM | POA: Diagnosis not present

## 2016-05-16 DIAGNOSIS — H6121 Impacted cerumen, right ear: Secondary | ICD-10-CM | POA: Diagnosis not present

## 2016-05-16 DIAGNOSIS — N2581 Secondary hyperparathyroidism of renal origin: Secondary | ICD-10-CM | POA: Diagnosis not present

## 2016-05-16 DIAGNOSIS — N186 End stage renal disease: Secondary | ICD-10-CM | POA: Diagnosis not present

## 2016-05-16 DIAGNOSIS — D509 Iron deficiency anemia, unspecified: Secondary | ICD-10-CM | POA: Diagnosis not present

## 2016-05-20 DIAGNOSIS — N2581 Secondary hyperparathyroidism of renal origin: Secondary | ICD-10-CM | POA: Diagnosis not present

## 2016-05-20 DIAGNOSIS — D509 Iron deficiency anemia, unspecified: Secondary | ICD-10-CM | POA: Diagnosis not present

## 2016-05-20 DIAGNOSIS — D631 Anemia in chronic kidney disease: Secondary | ICD-10-CM | POA: Diagnosis not present

## 2016-05-20 DIAGNOSIS — N186 End stage renal disease: Secondary | ICD-10-CM | POA: Diagnosis not present

## 2016-05-22 DIAGNOSIS — N186 End stage renal disease: Secondary | ICD-10-CM | POA: Diagnosis not present

## 2016-05-22 DIAGNOSIS — D631 Anemia in chronic kidney disease: Secondary | ICD-10-CM | POA: Diagnosis not present

## 2016-05-22 DIAGNOSIS — N2581 Secondary hyperparathyroidism of renal origin: Secondary | ICD-10-CM | POA: Diagnosis not present

## 2016-05-22 DIAGNOSIS — D509 Iron deficiency anemia, unspecified: Secondary | ICD-10-CM | POA: Diagnosis not present

## 2016-05-24 DIAGNOSIS — D509 Iron deficiency anemia, unspecified: Secondary | ICD-10-CM | POA: Diagnosis not present

## 2016-05-24 DIAGNOSIS — D631 Anemia in chronic kidney disease: Secondary | ICD-10-CM | POA: Diagnosis not present

## 2016-05-24 DIAGNOSIS — N2581 Secondary hyperparathyroidism of renal origin: Secondary | ICD-10-CM | POA: Diagnosis not present

## 2016-05-24 DIAGNOSIS — N186 End stage renal disease: Secondary | ICD-10-CM | POA: Diagnosis not present

## 2016-05-27 DIAGNOSIS — N186 End stage renal disease: Secondary | ICD-10-CM | POA: Diagnosis not present

## 2016-05-27 DIAGNOSIS — D509 Iron deficiency anemia, unspecified: Secondary | ICD-10-CM | POA: Diagnosis not present

## 2016-05-27 DIAGNOSIS — N2581 Secondary hyperparathyroidism of renal origin: Secondary | ICD-10-CM | POA: Diagnosis not present

## 2016-05-27 DIAGNOSIS — D631 Anemia in chronic kidney disease: Secondary | ICD-10-CM | POA: Diagnosis not present

## 2016-05-29 DIAGNOSIS — D631 Anemia in chronic kidney disease: Secondary | ICD-10-CM | POA: Diagnosis not present

## 2016-05-29 DIAGNOSIS — N2581 Secondary hyperparathyroidism of renal origin: Secondary | ICD-10-CM | POA: Diagnosis not present

## 2016-05-29 DIAGNOSIS — N033 Chronic nephritic syndrome with diffuse mesangial proliferative glomerulonephritis: Secondary | ICD-10-CM | POA: Diagnosis not present

## 2016-05-29 DIAGNOSIS — N186 End stage renal disease: Secondary | ICD-10-CM | POA: Diagnosis not present

## 2016-05-29 DIAGNOSIS — D509 Iron deficiency anemia, unspecified: Secondary | ICD-10-CM | POA: Diagnosis not present

## 2016-05-29 DIAGNOSIS — Z992 Dependence on renal dialysis: Secondary | ICD-10-CM | POA: Diagnosis not present

## 2016-05-31 DIAGNOSIS — D509 Iron deficiency anemia, unspecified: Secondary | ICD-10-CM | POA: Diagnosis not present

## 2016-05-31 DIAGNOSIS — N2581 Secondary hyperparathyroidism of renal origin: Secondary | ICD-10-CM | POA: Diagnosis not present

## 2016-05-31 DIAGNOSIS — D631 Anemia in chronic kidney disease: Secondary | ICD-10-CM | POA: Diagnosis not present

## 2016-05-31 DIAGNOSIS — Z23 Encounter for immunization: Secondary | ICD-10-CM | POA: Diagnosis not present

## 2016-05-31 DIAGNOSIS — N186 End stage renal disease: Secondary | ICD-10-CM | POA: Diagnosis not present

## 2016-06-03 DIAGNOSIS — N2581 Secondary hyperparathyroidism of renal origin: Secondary | ICD-10-CM | POA: Diagnosis not present

## 2016-06-03 DIAGNOSIS — N186 End stage renal disease: Secondary | ICD-10-CM | POA: Diagnosis not present

## 2016-06-03 DIAGNOSIS — Z23 Encounter for immunization: Secondary | ICD-10-CM | POA: Diagnosis not present

## 2016-06-03 DIAGNOSIS — D509 Iron deficiency anemia, unspecified: Secondary | ICD-10-CM | POA: Diagnosis not present

## 2016-06-03 DIAGNOSIS — D631 Anemia in chronic kidney disease: Secondary | ICD-10-CM | POA: Diagnosis not present

## 2016-06-05 DIAGNOSIS — N186 End stage renal disease: Secondary | ICD-10-CM | POA: Diagnosis not present

## 2016-06-05 DIAGNOSIS — D509 Iron deficiency anemia, unspecified: Secondary | ICD-10-CM | POA: Diagnosis not present

## 2016-06-05 DIAGNOSIS — N2581 Secondary hyperparathyroidism of renal origin: Secondary | ICD-10-CM | POA: Diagnosis not present

## 2016-06-05 DIAGNOSIS — Z23 Encounter for immunization: Secondary | ICD-10-CM | POA: Diagnosis not present

## 2016-06-05 DIAGNOSIS — D631 Anemia in chronic kidney disease: Secondary | ICD-10-CM | POA: Diagnosis not present

## 2016-06-06 DIAGNOSIS — N2581 Secondary hyperparathyroidism of renal origin: Secondary | ICD-10-CM | POA: Diagnosis not present

## 2016-06-06 DIAGNOSIS — D631 Anemia in chronic kidney disease: Secondary | ICD-10-CM | POA: Diagnosis not present

## 2016-06-06 DIAGNOSIS — Z23 Encounter for immunization: Secondary | ICD-10-CM | POA: Diagnosis not present

## 2016-06-06 DIAGNOSIS — N186 End stage renal disease: Secondary | ICD-10-CM | POA: Diagnosis not present

## 2016-06-06 DIAGNOSIS — D509 Iron deficiency anemia, unspecified: Secondary | ICD-10-CM | POA: Diagnosis not present

## 2016-06-10 DIAGNOSIS — D509 Iron deficiency anemia, unspecified: Secondary | ICD-10-CM | POA: Diagnosis not present

## 2016-06-10 DIAGNOSIS — D631 Anemia in chronic kidney disease: Secondary | ICD-10-CM | POA: Diagnosis not present

## 2016-06-10 DIAGNOSIS — Z23 Encounter for immunization: Secondary | ICD-10-CM | POA: Diagnosis not present

## 2016-06-10 DIAGNOSIS — N186 End stage renal disease: Secondary | ICD-10-CM | POA: Diagnosis not present

## 2016-06-10 DIAGNOSIS — N2581 Secondary hyperparathyroidism of renal origin: Secondary | ICD-10-CM | POA: Diagnosis not present

## 2016-06-12 DIAGNOSIS — N186 End stage renal disease: Secondary | ICD-10-CM | POA: Diagnosis not present

## 2016-06-12 DIAGNOSIS — D509 Iron deficiency anemia, unspecified: Secondary | ICD-10-CM | POA: Diagnosis not present

## 2016-06-12 DIAGNOSIS — N2581 Secondary hyperparathyroidism of renal origin: Secondary | ICD-10-CM | POA: Diagnosis not present

## 2016-06-12 DIAGNOSIS — Z23 Encounter for immunization: Secondary | ICD-10-CM | POA: Diagnosis not present

## 2016-06-12 DIAGNOSIS — D631 Anemia in chronic kidney disease: Secondary | ICD-10-CM | POA: Diagnosis not present

## 2016-06-14 DIAGNOSIS — N186 End stage renal disease: Secondary | ICD-10-CM | POA: Diagnosis not present

## 2016-06-14 DIAGNOSIS — Z23 Encounter for immunization: Secondary | ICD-10-CM | POA: Diagnosis not present

## 2016-06-14 DIAGNOSIS — D631 Anemia in chronic kidney disease: Secondary | ICD-10-CM | POA: Diagnosis not present

## 2016-06-14 DIAGNOSIS — N2581 Secondary hyperparathyroidism of renal origin: Secondary | ICD-10-CM | POA: Diagnosis not present

## 2016-06-14 DIAGNOSIS — D509 Iron deficiency anemia, unspecified: Secondary | ICD-10-CM | POA: Diagnosis not present

## 2016-06-17 DIAGNOSIS — N2581 Secondary hyperparathyroidism of renal origin: Secondary | ICD-10-CM | POA: Diagnosis not present

## 2016-06-17 DIAGNOSIS — D509 Iron deficiency anemia, unspecified: Secondary | ICD-10-CM | POA: Diagnosis not present

## 2016-06-17 DIAGNOSIS — N186 End stage renal disease: Secondary | ICD-10-CM | POA: Diagnosis not present

## 2016-06-17 DIAGNOSIS — D631 Anemia in chronic kidney disease: Secondary | ICD-10-CM | POA: Diagnosis not present

## 2016-06-17 DIAGNOSIS — Z23 Encounter for immunization: Secondary | ICD-10-CM | POA: Diagnosis not present

## 2016-06-19 DIAGNOSIS — D631 Anemia in chronic kidney disease: Secondary | ICD-10-CM | POA: Diagnosis not present

## 2016-06-19 DIAGNOSIS — N186 End stage renal disease: Secondary | ICD-10-CM | POA: Diagnosis not present

## 2016-06-19 DIAGNOSIS — D509 Iron deficiency anemia, unspecified: Secondary | ICD-10-CM | POA: Diagnosis not present

## 2016-06-19 DIAGNOSIS — Z23 Encounter for immunization: Secondary | ICD-10-CM | POA: Diagnosis not present

## 2016-06-19 DIAGNOSIS — N2581 Secondary hyperparathyroidism of renal origin: Secondary | ICD-10-CM | POA: Diagnosis not present

## 2016-06-21 DIAGNOSIS — Z23 Encounter for immunization: Secondary | ICD-10-CM | POA: Diagnosis not present

## 2016-06-21 DIAGNOSIS — N186 End stage renal disease: Secondary | ICD-10-CM | POA: Diagnosis not present

## 2016-06-21 DIAGNOSIS — D509 Iron deficiency anemia, unspecified: Secondary | ICD-10-CM | POA: Diagnosis not present

## 2016-06-21 DIAGNOSIS — D631 Anemia in chronic kidney disease: Secondary | ICD-10-CM | POA: Diagnosis not present

## 2016-06-21 DIAGNOSIS — N2581 Secondary hyperparathyroidism of renal origin: Secondary | ICD-10-CM | POA: Diagnosis not present

## 2016-06-24 ENCOUNTER — Encounter (HOSPITAL_COMMUNITY): Payer: Self-pay | Admitting: Family Medicine

## 2016-06-24 ENCOUNTER — Ambulatory Visit (HOSPITAL_COMMUNITY)
Admission: EM | Admit: 2016-06-24 | Discharge: 2016-06-24 | Disposition: A | Discharge status: Home / Self Care | Payer: Medicare Other | Attending: Family Medicine | Admitting: Family Medicine

## 2016-06-24 DIAGNOSIS — N186 End stage renal disease: Secondary | ICD-10-CM | POA: Diagnosis not present

## 2016-06-24 DIAGNOSIS — D509 Iron deficiency anemia, unspecified: Secondary | ICD-10-CM | POA: Diagnosis not present

## 2016-06-24 DIAGNOSIS — N2581 Secondary hyperparathyroidism of renal origin: Secondary | ICD-10-CM | POA: Diagnosis not present

## 2016-06-24 DIAGNOSIS — H109 Unspecified conjunctivitis: Secondary | ICD-10-CM | POA: Diagnosis not present

## 2016-06-24 DIAGNOSIS — D631 Anemia in chronic kidney disease: Secondary | ICD-10-CM | POA: Diagnosis not present

## 2016-06-24 DIAGNOSIS — Z23 Encounter for immunization: Secondary | ICD-10-CM | POA: Diagnosis not present

## 2016-06-24 NOTE — Discharge Instructions (Signed)
The redness does not appear to be infectious. Possibly irritation from environmental elements. Use Zaditor eyedrops 1 drop in each twice a day. Sustain eyedrops every 4 hours as needed for lubrication. If you are not getting better and 3-4 days call the ophthalmologist above.

## 2016-06-24 NOTE — ED Provider Notes (Signed)
CSN: [id number]     Arrival date & time 06/24/16  1243 History   First MD Initiated Contact with Patient 06/24/16 1400     Chief Complaint  Patient presents with  . Eye Problem   (Consider location/radiation/quality/duration/timing/severity/associated sxs/prior Treatment) 30 year old male with end-stage renal disease presents to the urgent care with complaints of redness of the eyes for 2 weeks. Denies pain, drainage or change in vision. Does not have foreign body sensation. Occasionally when he touches the eye lids they may feel mildly tender. No pain, headache or sensation of dryness.      Past Medical History:  Diagnosis Date  . Anemia   . Hypertension 08/09/2011  . Nephrotic syndrome    Past Surgical History:  Procedure Laterality Date  . AV FISTULA PLACEMENT Left 07/18/2013   Procedure: ARTERIOVENOUS (AV) FISTULA CREATION ;  Surgeon: Rosetta Posner, MD;  Location: [facility];  Service: Vascular;  Laterality: Left;  . INSERTION OF DIALYSIS CATHETER Right 07/18/2013   Procedure: INSERTION OF DIALYSIS CATHETER;  Surgeon: Rosetta Posner, MD;  Location: [facility];  Service: Vascular;  Laterality: Right;  . RENAL BIOPSY     Family History  Problem Relation Age of Onset  . Hypertension Mother    Social History  Substance Use Topics  . Smoking status: Passive Smoke Exposure - Never Smoker    Types: Cigars  . Smokeless tobacco: Never Used  . Alcohol use 0.6 oz/week    1 Shots of liquor per week    Review of Systems  Constitutional: Negative.   HENT: Negative.   Eyes: Positive for redness. Negative for photophobia, pain, discharge and visual disturbance.  Respiratory: Negative.   Neurological: Negative.   All other systems reviewed and are negative.   Allergies  Review of patient's allergies indicates no known allergies.  Home Medications   Prior to Admission medications   Medication Sig Start Date End Date Taking? Authorizing Provider  calcium acetate (PHOSLO) 667 MG capsule  Take 1,334 mg by mouth 3 (three) times daily with meals.  01/26/14   Historical Provider, MD  Cinacalcet HCl (SENSIPAR PO) Take by mouth.    Historical Provider, MD  LISINOPRIL PO Take 1 tablet by mouth daily. Patient states he takes this medication. However doesn't know what pharmacy he uses.    Historical Provider, MD  phosphorus (K PHOS NEUTRAL) 155-852-130 MG tablet Take by mouth 3 (three) times daily.    Historical Provider, MD   Meds Ordered and Administered this Visit  Medications - No data to display  BP 118/86 (BP Location: Left Arm)   Pulse 89   Temp 98.8 F (37.1 C) (Oral)   Resp 12   SpO2 98%  No data found.   Physical Exam  Constitutional: He is oriented to person, place, and time. He appears well-developed and well-nourished. No distress.  HENT:  Head: Normocephalic and atraumatic.  Eyes: EOM are normal. Pupils are equal, round, and reactive to light. No scleral icterus.  Bilateral eyes. Upper and lower conjunctiva with mild erythema and swelling. Sclera injected. No drainage. Anterior chambers clear. No hyphema. Normal pupillary response. EOMI. No surface defects. No foreign body seen.  Neck: Normal range of motion. Neck supple.  Cardiovascular: Normal rate.   Pulmonary/Chest: Effort normal.  Musculoskeletal: Normal range of motion.  Neurological: He is alert and oriented to person, place, and time.  Skin: Skin is warm and dry. He is not diaphoretic.  Nursing note and vitals reviewed.   Urgent Care Course  Clinical Course    Procedures (including critical care time)  Labs Review Labs Reviewed - No data to display  Imaging Review No results found.   Visual Acuity Review  Right Eye Distance:   Left Eye Distance:   Bilateral Distance:    Right Eye Near:   Left Eye Near:    Bilateral Near:         MDM   1. Bilateral conjunctivitis    The redness does not appear to be infectious. Possibly irritation from environmental elements. Use Zaditor  eyedrops 1 drop in each twice a day. Sustain eyedrops every 4 hours as needed for lubrication. If you are not getting better and 3-4 days call the ophthalmologist above.      Janne Napoleon, NP 06/24/16 1419

## 2016-06-24 NOTE — ED Triage Notes (Signed)
Pt here for redness to both eye. Denies pain or irritation. denies drainage.

## 2016-06-26 DIAGNOSIS — N186 End stage renal disease: Secondary | ICD-10-CM | POA: Diagnosis not present

## 2016-06-26 DIAGNOSIS — N2581 Secondary hyperparathyroidism of renal origin: Secondary | ICD-10-CM | POA: Diagnosis not present

## 2016-06-26 DIAGNOSIS — D509 Iron deficiency anemia, unspecified: Secondary | ICD-10-CM | POA: Diagnosis not present

## 2016-06-26 DIAGNOSIS — D631 Anemia in chronic kidney disease: Secondary | ICD-10-CM | POA: Diagnosis not present

## 2016-06-26 DIAGNOSIS — Z23 Encounter for immunization: Secondary | ICD-10-CM | POA: Diagnosis not present

## 2016-06-27 DIAGNOSIS — N2581 Secondary hyperparathyroidism of renal origin: Secondary | ICD-10-CM | POA: Diagnosis not present

## 2016-06-27 DIAGNOSIS — Z23 Encounter for immunization: Secondary | ICD-10-CM | POA: Diagnosis not present

## 2016-06-27 DIAGNOSIS — D509 Iron deficiency anemia, unspecified: Secondary | ICD-10-CM | POA: Diagnosis not present

## 2016-06-27 DIAGNOSIS — D631 Anemia in chronic kidney disease: Secondary | ICD-10-CM | POA: Diagnosis not present

## 2016-06-27 DIAGNOSIS — N186 End stage renal disease: Secondary | ICD-10-CM | POA: Diagnosis not present

## 2016-06-28 DIAGNOSIS — N033 Chronic nephritic syndrome with diffuse mesangial proliferative glomerulonephritis: Secondary | ICD-10-CM | POA: Diagnosis not present

## 2016-06-28 DIAGNOSIS — N186 End stage renal disease: Secondary | ICD-10-CM | POA: Diagnosis not present

## 2016-06-28 DIAGNOSIS — Z992 Dependence on renal dialysis: Secondary | ICD-10-CM | POA: Diagnosis not present

## 2016-07-01 DIAGNOSIS — N186 End stage renal disease: Secondary | ICD-10-CM | POA: Diagnosis not present

## 2016-07-01 DIAGNOSIS — D631 Anemia in chronic kidney disease: Secondary | ICD-10-CM | POA: Diagnosis not present

## 2016-07-01 DIAGNOSIS — D509 Iron deficiency anemia, unspecified: Secondary | ICD-10-CM | POA: Diagnosis not present

## 2016-07-01 DIAGNOSIS — R7309 Other abnormal glucose: Secondary | ICD-10-CM | POA: Diagnosis not present

## 2016-07-01 DIAGNOSIS — N2581 Secondary hyperparathyroidism of renal origin: Secondary | ICD-10-CM | POA: Diagnosis not present

## 2016-07-03 DIAGNOSIS — R7309 Other abnormal glucose: Secondary | ICD-10-CM | POA: Diagnosis not present

## 2016-07-03 DIAGNOSIS — N186 End stage renal disease: Secondary | ICD-10-CM | POA: Diagnosis not present

## 2016-07-03 DIAGNOSIS — N2581 Secondary hyperparathyroidism of renal origin: Secondary | ICD-10-CM | POA: Diagnosis not present

## 2016-07-03 DIAGNOSIS — D631 Anemia in chronic kidney disease: Secondary | ICD-10-CM | POA: Diagnosis not present

## 2016-07-03 DIAGNOSIS — D509 Iron deficiency anemia, unspecified: Secondary | ICD-10-CM | POA: Diagnosis not present

## 2016-07-04 DIAGNOSIS — R7309 Other abnormal glucose: Secondary | ICD-10-CM | POA: Diagnosis not present

## 2016-07-04 DIAGNOSIS — N186 End stage renal disease: Secondary | ICD-10-CM | POA: Diagnosis not present

## 2016-07-04 DIAGNOSIS — D509 Iron deficiency anemia, unspecified: Secondary | ICD-10-CM | POA: Diagnosis not present

## 2016-07-04 DIAGNOSIS — N2581 Secondary hyperparathyroidism of renal origin: Secondary | ICD-10-CM | POA: Diagnosis not present

## 2016-07-04 DIAGNOSIS — D631 Anemia in chronic kidney disease: Secondary | ICD-10-CM | POA: Diagnosis not present

## 2016-07-08 ENCOUNTER — Encounter (HOSPITAL_COMMUNITY): Payer: Self-pay | Admitting: Emergency Medicine

## 2016-07-08 ENCOUNTER — Emergency Department (HOSPITAL_COMMUNITY)
Admission: EM | Admit: 2016-07-08 | Discharge: 2016-07-09 | Disposition: A | Discharge status: Home / Self Care | Payer: Medicare Other | Attending: Emergency Medicine | Admitting: Emergency Medicine

## 2016-07-08 DIAGNOSIS — D631 Anemia in chronic kidney disease: Secondary | ICD-10-CM | POA: Diagnosis not present

## 2016-07-08 DIAGNOSIS — Z79899 Other long term (current) drug therapy: Secondary | ICD-10-CM | POA: Insufficient documentation

## 2016-07-08 DIAGNOSIS — I120 Hypertensive chronic kidney disease with stage 5 chronic kidney disease or end stage renal disease: Secondary | ICD-10-CM | POA: Insufficient documentation

## 2016-07-08 DIAGNOSIS — N2581 Secondary hyperparathyroidism of renal origin: Secondary | ICD-10-CM | POA: Diagnosis not present

## 2016-07-08 DIAGNOSIS — Z7722 Contact with and (suspected) exposure to environmental tobacco smoke (acute) (chronic): Secondary | ICD-10-CM | POA: Diagnosis not present

## 2016-07-08 DIAGNOSIS — R7309 Other abnormal glucose: Secondary | ICD-10-CM | POA: Diagnosis not present

## 2016-07-08 DIAGNOSIS — R404 Transient alteration of awareness: Secondary | ICD-10-CM | POA: Diagnosis not present

## 2016-07-08 DIAGNOSIS — Z992 Dependence on renal dialysis: Secondary | ICD-10-CM | POA: Diagnosis not present

## 2016-07-08 DIAGNOSIS — R531 Weakness: Secondary | ICD-10-CM | POA: Diagnosis not present

## 2016-07-08 DIAGNOSIS — N186 End stage renal disease: Secondary | ICD-10-CM | POA: Insufficient documentation

## 2016-07-08 DIAGNOSIS — R51 Headache: Secondary | ICD-10-CM | POA: Diagnosis not present

## 2016-07-08 DIAGNOSIS — R519 Headache, unspecified: Secondary | ICD-10-CM

## 2016-07-08 DIAGNOSIS — G4489 Other headache syndrome: Secondary | ICD-10-CM | POA: Diagnosis not present

## 2016-07-08 DIAGNOSIS — D509 Iron deficiency anemia, unspecified: Secondary | ICD-10-CM | POA: Diagnosis not present

## 2016-07-08 LAB — COMPREHENSIVE METABOLIC PANEL
ALT: 13 U/L — ABNORMAL LOW (ref 17–63)
AST: 15 U/L (ref 15–41)
Albumin: 4.7 g/dL (ref 3.5–5.0)
Alkaline Phosphatase: 44 U/L (ref 38–126)
Anion gap: 18 — ABNORMAL HIGH (ref 5–15)
BUN: 27 mg/dL — ABNORMAL HIGH (ref 6–20)
CO2: 25 mmol/L (ref 22–32)
Calcium: 10 mg/dL (ref 8.9–10.3)
Chloride: 97 mmol/L — ABNORMAL LOW (ref 101–111)
Creatinine, Ser: 8.44 mg/dL — ABNORMAL HIGH (ref 0.61–1.24)
GFR calc Af Amer: 9 mL/min — ABNORMAL LOW (ref >60)
GFR calc non Af Amer: 8 mL/min — ABNORMAL LOW (ref >60)
Glucose, Bld: 86 mg/dL (ref 65–99)
Potassium: 3.9 mmol/L (ref 3.5–5.1)
Sodium: 140 mmol/L (ref 135–145)
Total Bilirubin: 0.5 mg/dL (ref 0.3–1.2)
Total Protein: 8.9 g/dL — ABNORMAL HIGH (ref 6.5–8.1)

## 2016-07-08 LAB — CBC
HCT: 39.7 % (ref 39.0–52.0)
Hemoglobin: 12.7 g/dL — ABNORMAL LOW (ref 13.0–17.0)
MCH: 29.3 pg (ref 26.0–34.0)
MCHC: 32 g/dL (ref 30.0–36.0)
MCV: 91.7 fL (ref 78.0–100.0)
Platelets: 213 10*3/uL (ref 150–400)
RBC: 4.33 MIL/uL (ref 4.22–5.81)
RDW: 18.3 % — ABNORMAL HIGH (ref 11.5–15.5)
WBC: 6.9 10*3/uL (ref 4.0–10.5)

## 2016-07-08 MED ORDER — METOCLOPRAMIDE HCL 5 MG/ML IJ SOLN
10.0000 mg | Freq: Once | INTRAMUSCULAR | Status: AC
  Administered 2016-07-08: 10 mg via INTRAVENOUS
  Filled 2016-07-08: qty 2

## 2016-07-08 MED ORDER — DIPHENHYDRAMINE HCL 50 MG/ML IJ SOLN
25.0000 mg | Freq: Once | INTRAMUSCULAR | Status: AC
  Administered 2016-07-08: 25 mg via INTRAVENOUS
  Filled 2016-07-08: qty 1

## 2016-07-08 NOTE — ED Triage Notes (Signed)
Per gcems, pt c/o headache, n/v x3 hours, pt went to dialysis today, had no issues at diaylsis, completed dialysis (6 out of 8). Pt in NAD with ems. Access in left arm. AAOx4.

## 2016-07-08 NOTE — ED Provider Notes (Signed)
[facility] DEPT Provider Note   CSN: [id number] Arrival date & time: 07/08/16  1949  By signing my name below, I, Higinio Plan, attest that this documentation has been prepared under the direction and in the presence of Ripley Fraise, MD . Electronically Signed: Higinio Plan, Scribe. 07/08/2016. 11:19 PM.  History   Chief Complaint Chief Complaint  Patient presents with  . Headache   The history is provided by the patient. No language interpreter was used.   HPI Comments: Christopher Wang is a 30 y.o. male with PMHx of HTN, who presents to the Emergency Department complaining of gradually worsening, headache that began at ~4:00 PM his afternoon. Pt reports he received dialysis this morning at 12:00 PM and did not have a headache when he was finished at ~1:00 PM. However, he returned home to his apartment where his roommate had the air set on 85 degrees and states the heat mixed with the hot air outside could have caused his symptoms. He states associated shortness of breath. He denies fever, vomiting, visual disturbances, recent falls or injury and hx of strokes, brain injury or frequent headaches.   Past Medical History:  Diagnosis Date  . Anemia   . Hypertension 08/09/2011  . Nephrotic syndrome     Patient Active Problem List   Diagnosis Date Noted  . Hypotension of hemodialysis 08/03/2013  . Syncope 08/03/2013  . ESRD needing dialysis ([place]) 07/16/2013  . Anemia in chronic kidney disease 07/16/2013  . Acute on chronic renal insufficiency 07/15/2013  . Uremia 07/15/2013  . Enteritis 07/15/2013  . End stage renal disease ([place]) 07/08/2013  . Chronic kidney disease (CKD), stage IV (severe) ([place]) 07/08/2013  . FSGS (focal segmental glomerulosclerosis) with nephrosis 08/15/2011  . Hypertension 08/09/2011  . Hyperlipidemia 08/07/2011  . Nephrotic syndrome 08/06/2011  . Serum albumin decreased 08/06/2011  . Proteinuria 08/06/2011  . Edema of lower extremity 08/06/2011  .  Scrotal edema 08/06/2011  . Hypokalemia 08/06/2011  . Anemia 08/06/2011    Past Surgical History:  Procedure Laterality Date  . AV FISTULA PLACEMENT Left 07/18/2013   Procedure: ARTERIOVENOUS (AV) FISTULA CREATION ;  Surgeon: Rosetta Posner, MD;  Location: [facility];  Service: Vascular;  Laterality: Left;  . INSERTION OF DIALYSIS CATHETER Right 07/18/2013   Procedure: INSERTION OF DIALYSIS CATHETER;  Surgeon: Rosetta Posner, MD;  Location: [facility];  Service: Vascular;  Laterality: Right;  . RENAL BIOPSY       Home Medications    Prior to Admission medications   Medication Sig Start Date End Date Taking? Authorizing Provider  calcium acetate (PHOSLO) 667 MG capsule Take 1,334 mg by mouth 3 (three) times daily with meals.  01/26/14   Historical Provider, MD  Cinacalcet HCl (SENSIPAR PO) Take by mouth.    Historical Provider, MD  LISINOPRIL PO Take 1 tablet by mouth daily. Patient states he takes this medication. However doesn't know what pharmacy he uses.    Historical Provider, MD  phosphorus (K PHOS NEUTRAL) 155-852-130 MG tablet Take by mouth 3 (three) times daily.    Historical Provider, MD    Family History Family History  Problem Relation Age of Onset  . Hypertension Mother     Social History Social History  Substance Use Topics  . Smoking status: Passive Smoke Exposure - Never Smoker    Types: Cigars  . Smokeless tobacco: Never Used  . Alcohol use 0.6 oz/week    1 Shots of liquor per week     Allergies  Review of patient's allergies indicates no known allergies.   Review of Systems Review of Systems  Constitutional: Negative for fever.  Eyes: Negative for visual disturbance.  Respiratory: Positive for shortness of breath.   Cardiovascular: Negative for chest pain.  Gastrointestinal: Negative for vomiting.  Neurological: Positive for headaches. Negative for weakness.  All other systems reviewed and are negative.  Physical Exam Updated Vital Signs BP 137/84   Pulse  80   Temp 98 F (36.7 C)   Resp 18   Ht 6' (1.829 m)   Wt 145 lb (65.8 kg)   SpO2 100%   BMI 19.67 kg/m   Physical Exam CONSTITUTIONAL: Well developed/well nourished HEAD: Normocephalic/atraumatic EYES: EOMI/PERRL, no nystagmus, no ptosis ENMT: Mucous membranes moist NECK: supple no meningeal signs, no bruits SPINE/BACK:entire spine nontender CV: S1/S2 noted, no murmurs/rubs/gallops noted LUNGS: Lungs are clear to auscultation bilaterally, no apparent distress ABDOMEN: soft, nontender, no rebound or guarding GU:no cva tenderness NEURO:Awake/alert, face symmetric, no arm or leg drift is noted Equal 5/5 strength with shoulder abduction, elbow flex/extension, wrist flex/extension in upper extremities and equal hand grips bilaterally Cranial nerves 3/4/5/6/04/06/09/11/12 tested and intact Gait normal without ataxia No past pointing Sensation to light touch intact in all extremities EXTREMITIES: pulses normal, full ROM, dialysis access to left arm left thrill noted SKIN: warm, color normal PSYCH: no abnormalities of mood noted, alert and oriented to situation  ED Treatments / Results  Labs (all labs ordered are listed, but only abnormal results are displayed) Labs Reviewed  COMPREHENSIVE METABOLIC PANEL - Abnormal; Notable for the following:       Result Value   Chloride 97 (*)    BUN 27 (*)    Creatinine, Ser 8.44 (*)    Total Protein 8.9 (*)    ALT 13 (*)    GFR calc non Af Amer 8 (*)    GFR calc Af Amer 9 (*)    Anion gap 18 (*)    All other components within normal limits  CBC - Abnormal; Notable for the following:    Hemoglobin 12.7 (*)    RDW 18.3 (*)    All other components within normal limits    EKG  EKG Interpretation None       Radiology No results found.  Procedures Procedures  Medications Ordered in ED Medications  metoCLOPramide (REGLAN) injection 10 mg (10 mg Intravenous Given 07/08/16 2356)  diphenhydrAMINE (BENADRYL) injection 25 mg (25 mg  Intravenous Given 07/08/16 2356)    DIAGNOSTIC STUDIES:  Oxygen Saturation is 100% on RA, normal by my interpretation.    COORDINATION OF CARE:  11:18 PM Discussed treatment plan with pt at bedside and pt agreed to plan.  Initial Impression / Assessment and Plan / ED Course  I have reviewed the triage vital signs and the nursing notes.  Pertinent labs results that were available during my care of the patient were reviewed by me and considered in my medical decision making (see chart for details).  Clinical Course    Pt improved He is resting comfortably I feel he is appropriate for d/c home I don't suspect acute neurologic emergency at this time We discussed strict return precautions BP 130/95 (BP Location: Right Arm)   Pulse 84   Temp 98.2 F (36.8 C) (Oral)   Resp 12   Ht 6' (1.829 m)   Wt 65.8 kg   SpO2 94%   BMI 19.67 kg/m    I personally performed the services described in this  documentation, which was scribed in my presence. The recorded information has been reviewed and is accurate.   Final Clinical Impressions(s) / ED Diagnoses   Final diagnoses:  Acute nonintractable headache, unspecified headache type    New Prescriptions New Prescriptions   No medications on file     Ripley Fraise, MD 07/09/16 [phone]

## 2016-07-09 NOTE — Discharge Instructions (Signed)

## 2016-07-09 NOTE — ED Notes (Signed)
Pt provided with d/c instructions at this time. Pt verbalizes understanding of d/c instructions as well as follow up procedure after d/c. No new RX at time of d/c.  Pt in no apparent distress at this time. Pt ambulatory at time of d/c.   

## 2016-07-10 DIAGNOSIS — D631 Anemia in chronic kidney disease: Secondary | ICD-10-CM | POA: Diagnosis not present

## 2016-07-10 DIAGNOSIS — N186 End stage renal disease: Secondary | ICD-10-CM | POA: Diagnosis not present

## 2016-07-10 DIAGNOSIS — R7309 Other abnormal glucose: Secondary | ICD-10-CM | POA: Diagnosis not present

## 2016-07-10 DIAGNOSIS — N2581 Secondary hyperparathyroidism of renal origin: Secondary | ICD-10-CM | POA: Diagnosis not present

## 2016-07-10 DIAGNOSIS — D509 Iron deficiency anemia, unspecified: Secondary | ICD-10-CM | POA: Diagnosis not present

## 2016-07-12 DIAGNOSIS — R7309 Other abnormal glucose: Secondary | ICD-10-CM | POA: Diagnosis not present

## 2016-07-12 DIAGNOSIS — N2581 Secondary hyperparathyroidism of renal origin: Secondary | ICD-10-CM | POA: Diagnosis not present

## 2016-07-12 DIAGNOSIS — D509 Iron deficiency anemia, unspecified: Secondary | ICD-10-CM | POA: Diagnosis not present

## 2016-07-12 DIAGNOSIS — D631 Anemia in chronic kidney disease: Secondary | ICD-10-CM | POA: Diagnosis not present

## 2016-07-12 DIAGNOSIS — N186 End stage renal disease: Secondary | ICD-10-CM | POA: Diagnosis not present

## 2016-07-15 DIAGNOSIS — N2581 Secondary hyperparathyroidism of renal origin: Secondary | ICD-10-CM | POA: Diagnosis not present

## 2016-07-15 DIAGNOSIS — D509 Iron deficiency anemia, unspecified: Secondary | ICD-10-CM | POA: Diagnosis not present

## 2016-07-15 DIAGNOSIS — R7309 Other abnormal glucose: Secondary | ICD-10-CM | POA: Diagnosis not present

## 2016-07-15 DIAGNOSIS — D631 Anemia in chronic kidney disease: Secondary | ICD-10-CM | POA: Diagnosis not present

## 2016-07-15 DIAGNOSIS — N186 End stage renal disease: Secondary | ICD-10-CM | POA: Diagnosis not present

## 2016-07-17 DIAGNOSIS — N186 End stage renal disease: Secondary | ICD-10-CM | POA: Diagnosis not present

## 2016-07-17 DIAGNOSIS — N2581 Secondary hyperparathyroidism of renal origin: Secondary | ICD-10-CM | POA: Diagnosis not present

## 2016-07-17 DIAGNOSIS — D631 Anemia in chronic kidney disease: Secondary | ICD-10-CM | POA: Diagnosis not present

## 2016-07-17 DIAGNOSIS — R7309 Other abnormal glucose: Secondary | ICD-10-CM | POA: Diagnosis not present

## 2016-07-17 DIAGNOSIS — D509 Iron deficiency anemia, unspecified: Secondary | ICD-10-CM | POA: Diagnosis not present

## 2016-07-19 DIAGNOSIS — R7309 Other abnormal glucose: Secondary | ICD-10-CM | POA: Diagnosis not present

## 2016-07-19 DIAGNOSIS — N2581 Secondary hyperparathyroidism of renal origin: Secondary | ICD-10-CM | POA: Diagnosis not present

## 2016-07-19 DIAGNOSIS — D631 Anemia in chronic kidney disease: Secondary | ICD-10-CM | POA: Diagnosis not present

## 2016-07-19 DIAGNOSIS — N186 End stage renal disease: Secondary | ICD-10-CM | POA: Diagnosis not present

## 2016-07-19 DIAGNOSIS — D509 Iron deficiency anemia, unspecified: Secondary | ICD-10-CM | POA: Diagnosis not present

## 2016-07-22 DIAGNOSIS — N2581 Secondary hyperparathyroidism of renal origin: Secondary | ICD-10-CM | POA: Diagnosis not present

## 2016-07-22 DIAGNOSIS — D509 Iron deficiency anemia, unspecified: Secondary | ICD-10-CM | POA: Diagnosis not present

## 2016-07-22 DIAGNOSIS — D631 Anemia in chronic kidney disease: Secondary | ICD-10-CM | POA: Diagnosis not present

## 2016-07-22 DIAGNOSIS — N186 End stage renal disease: Secondary | ICD-10-CM | POA: Diagnosis not present

## 2016-07-22 DIAGNOSIS — R7309 Other abnormal glucose: Secondary | ICD-10-CM | POA: Diagnosis not present

## 2016-07-24 DIAGNOSIS — D631 Anemia in chronic kidney disease: Secondary | ICD-10-CM | POA: Diagnosis not present

## 2016-07-24 DIAGNOSIS — N2581 Secondary hyperparathyroidism of renal origin: Secondary | ICD-10-CM | POA: Diagnosis not present

## 2016-07-24 DIAGNOSIS — R7309 Other abnormal glucose: Secondary | ICD-10-CM | POA: Diagnosis not present

## 2016-07-24 DIAGNOSIS — N186 End stage renal disease: Secondary | ICD-10-CM | POA: Diagnosis not present

## 2016-07-24 DIAGNOSIS — D509 Iron deficiency anemia, unspecified: Secondary | ICD-10-CM | POA: Diagnosis not present

## 2016-07-26 DIAGNOSIS — D631 Anemia in chronic kidney disease: Secondary | ICD-10-CM | POA: Diagnosis not present

## 2016-07-26 DIAGNOSIS — N186 End stage renal disease: Secondary | ICD-10-CM | POA: Diagnosis not present

## 2016-07-26 DIAGNOSIS — D509 Iron deficiency anemia, unspecified: Secondary | ICD-10-CM | POA: Diagnosis not present

## 2016-07-26 DIAGNOSIS — N2581 Secondary hyperparathyroidism of renal origin: Secondary | ICD-10-CM | POA: Diagnosis not present

## 2016-07-26 DIAGNOSIS — R7309 Other abnormal glucose: Secondary | ICD-10-CM | POA: Diagnosis not present

## 2016-07-29 DIAGNOSIS — N2581 Secondary hyperparathyroidism of renal origin: Secondary | ICD-10-CM | POA: Diagnosis not present

## 2016-07-29 DIAGNOSIS — N186 End stage renal disease: Secondary | ICD-10-CM | POA: Diagnosis not present

## 2016-07-29 DIAGNOSIS — N033 Chronic nephritic syndrome with diffuse mesangial proliferative glomerulonephritis: Secondary | ICD-10-CM | POA: Diagnosis not present

## 2016-07-29 DIAGNOSIS — D509 Iron deficiency anemia, unspecified: Secondary | ICD-10-CM | POA: Diagnosis not present

## 2016-07-29 DIAGNOSIS — R7309 Other abnormal glucose: Secondary | ICD-10-CM | POA: Diagnosis not present

## 2016-07-29 DIAGNOSIS — D631 Anemia in chronic kidney disease: Secondary | ICD-10-CM | POA: Diagnosis not present

## 2016-07-29 DIAGNOSIS — Z992 Dependence on renal dialysis: Secondary | ICD-10-CM | POA: Diagnosis not present

## 2016-07-31 DIAGNOSIS — N186 End stage renal disease: Secondary | ICD-10-CM | POA: Diagnosis not present

## 2016-07-31 DIAGNOSIS — N2581 Secondary hyperparathyroidism of renal origin: Secondary | ICD-10-CM | POA: Diagnosis not present

## 2016-07-31 DIAGNOSIS — D509 Iron deficiency anemia, unspecified: Secondary | ICD-10-CM | POA: Diagnosis not present

## 2016-07-31 DIAGNOSIS — D631 Anemia in chronic kidney disease: Secondary | ICD-10-CM | POA: Diagnosis not present

## 2016-08-02 DIAGNOSIS — N186 End stage renal disease: Secondary | ICD-10-CM | POA: Diagnosis not present

## 2016-08-02 DIAGNOSIS — D631 Anemia in chronic kidney disease: Secondary | ICD-10-CM | POA: Diagnosis not present

## 2016-08-02 DIAGNOSIS — N2581 Secondary hyperparathyroidism of renal origin: Secondary | ICD-10-CM | POA: Diagnosis not present

## 2016-08-02 DIAGNOSIS — D509 Iron deficiency anemia, unspecified: Secondary | ICD-10-CM | POA: Diagnosis not present

## 2016-08-05 DIAGNOSIS — N186 End stage renal disease: Secondary | ICD-10-CM | POA: Diagnosis not present

## 2016-08-05 DIAGNOSIS — N2581 Secondary hyperparathyroidism of renal origin: Secondary | ICD-10-CM | POA: Diagnosis not present

## 2016-08-05 DIAGNOSIS — D631 Anemia in chronic kidney disease: Secondary | ICD-10-CM | POA: Diagnosis not present

## 2016-08-05 DIAGNOSIS — D509 Iron deficiency anemia, unspecified: Secondary | ICD-10-CM | POA: Diagnosis not present

## 2016-08-07 DIAGNOSIS — D631 Anemia in chronic kidney disease: Secondary | ICD-10-CM | POA: Diagnosis not present

## 2016-08-07 DIAGNOSIS — N2581 Secondary hyperparathyroidism of renal origin: Secondary | ICD-10-CM | POA: Diagnosis not present

## 2016-08-07 DIAGNOSIS — N186 End stage renal disease: Secondary | ICD-10-CM | POA: Diagnosis not present

## 2016-08-07 DIAGNOSIS — D509 Iron deficiency anemia, unspecified: Secondary | ICD-10-CM | POA: Diagnosis not present

## 2016-08-09 DIAGNOSIS — N2581 Secondary hyperparathyroidism of renal origin: Secondary | ICD-10-CM | POA: Diagnosis not present

## 2016-08-09 DIAGNOSIS — N186 End stage renal disease: Secondary | ICD-10-CM | POA: Diagnosis not present

## 2016-08-09 DIAGNOSIS — D509 Iron deficiency anemia, unspecified: Secondary | ICD-10-CM | POA: Diagnosis not present

## 2016-08-09 DIAGNOSIS — D631 Anemia in chronic kidney disease: Secondary | ICD-10-CM | POA: Diagnosis not present

## 2016-08-12 DIAGNOSIS — N186 End stage renal disease: Secondary | ICD-10-CM | POA: Diagnosis not present

## 2016-08-12 DIAGNOSIS — D631 Anemia in chronic kidney disease: Secondary | ICD-10-CM | POA: Diagnosis not present

## 2016-08-12 DIAGNOSIS — D509 Iron deficiency anemia, unspecified: Secondary | ICD-10-CM | POA: Diagnosis not present

## 2016-08-12 DIAGNOSIS — N2581 Secondary hyperparathyroidism of renal origin: Secondary | ICD-10-CM | POA: Diagnosis not present

## 2016-08-14 DIAGNOSIS — D509 Iron deficiency anemia, unspecified: Secondary | ICD-10-CM | POA: Diagnosis not present

## 2016-08-14 DIAGNOSIS — D631 Anemia in chronic kidney disease: Secondary | ICD-10-CM | POA: Diagnosis not present

## 2016-08-14 DIAGNOSIS — N186 End stage renal disease: Secondary | ICD-10-CM | POA: Diagnosis not present

## 2016-08-14 DIAGNOSIS — N2581 Secondary hyperparathyroidism of renal origin: Secondary | ICD-10-CM | POA: Diagnosis not present

## 2016-08-16 DIAGNOSIS — D509 Iron deficiency anemia, unspecified: Secondary | ICD-10-CM | POA: Diagnosis not present

## 2016-08-16 DIAGNOSIS — N2581 Secondary hyperparathyroidism of renal origin: Secondary | ICD-10-CM | POA: Diagnosis not present

## 2016-08-16 DIAGNOSIS — N186 End stage renal disease: Secondary | ICD-10-CM | POA: Diagnosis not present

## 2016-08-16 DIAGNOSIS — D631 Anemia in chronic kidney disease: Secondary | ICD-10-CM | POA: Diagnosis not present

## 2016-08-18 DIAGNOSIS — N186 End stage renal disease: Secondary | ICD-10-CM | POA: Diagnosis not present

## 2016-08-18 DIAGNOSIS — D509 Iron deficiency anemia, unspecified: Secondary | ICD-10-CM | POA: Diagnosis not present

## 2016-08-18 DIAGNOSIS — N2581 Secondary hyperparathyroidism of renal origin: Secondary | ICD-10-CM | POA: Diagnosis not present

## 2016-08-18 DIAGNOSIS — D631 Anemia in chronic kidney disease: Secondary | ICD-10-CM | POA: Diagnosis not present

## 2016-08-20 DIAGNOSIS — D631 Anemia in chronic kidney disease: Secondary | ICD-10-CM | POA: Diagnosis not present

## 2016-08-20 DIAGNOSIS — N186 End stage renal disease: Secondary | ICD-10-CM | POA: Diagnosis not present

## 2016-08-20 DIAGNOSIS — N2581 Secondary hyperparathyroidism of renal origin: Secondary | ICD-10-CM | POA: Diagnosis not present

## 2016-08-20 DIAGNOSIS — D509 Iron deficiency anemia, unspecified: Secondary | ICD-10-CM | POA: Diagnosis not present

## 2016-08-23 DIAGNOSIS — D631 Anemia in chronic kidney disease: Secondary | ICD-10-CM | POA: Diagnosis not present

## 2016-08-23 DIAGNOSIS — N2581 Secondary hyperparathyroidism of renal origin: Secondary | ICD-10-CM | POA: Diagnosis not present

## 2016-08-23 DIAGNOSIS — D509 Iron deficiency anemia, unspecified: Secondary | ICD-10-CM | POA: Diagnosis not present

## 2016-08-23 DIAGNOSIS — N186 End stage renal disease: Secondary | ICD-10-CM | POA: Diagnosis not present

## 2016-08-26 DIAGNOSIS — N2581 Secondary hyperparathyroidism of renal origin: Secondary | ICD-10-CM | POA: Diagnosis not present

## 2016-08-26 DIAGNOSIS — N186 End stage renal disease: Secondary | ICD-10-CM | POA: Diagnosis not present

## 2016-08-28 DIAGNOSIS — N033 Chronic nephritic syndrome with diffuse mesangial proliferative glomerulonephritis: Secondary | ICD-10-CM | POA: Diagnosis not present

## 2016-08-28 DIAGNOSIS — N186 End stage renal disease: Secondary | ICD-10-CM | POA: Diagnosis not present

## 2016-08-28 DIAGNOSIS — Z992 Dependence on renal dialysis: Secondary | ICD-10-CM | POA: Diagnosis not present

## 2016-08-29 DIAGNOSIS — N2581 Secondary hyperparathyroidism of renal origin: Secondary | ICD-10-CM | POA: Diagnosis not present

## 2016-08-29 DIAGNOSIS — D631 Anemia in chronic kidney disease: Secondary | ICD-10-CM | POA: Diagnosis not present

## 2016-08-29 DIAGNOSIS — D509 Iron deficiency anemia, unspecified: Secondary | ICD-10-CM | POA: Diagnosis not present

## 2016-08-29 DIAGNOSIS — N186 End stage renal disease: Secondary | ICD-10-CM | POA: Diagnosis not present

## 2016-09-01 DIAGNOSIS — D631 Anemia in chronic kidney disease: Secondary | ICD-10-CM | POA: Diagnosis not present

## 2016-09-01 DIAGNOSIS — N2581 Secondary hyperparathyroidism of renal origin: Secondary | ICD-10-CM | POA: Diagnosis not present

## 2016-09-01 DIAGNOSIS — D509 Iron deficiency anemia, unspecified: Secondary | ICD-10-CM | POA: Diagnosis not present

## 2016-09-01 DIAGNOSIS — N186 End stage renal disease: Secondary | ICD-10-CM | POA: Diagnosis not present

## 2016-09-02 DIAGNOSIS — D631 Anemia in chronic kidney disease: Secondary | ICD-10-CM | POA: Diagnosis not present

## 2016-09-02 DIAGNOSIS — N186 End stage renal disease: Secondary | ICD-10-CM | POA: Diagnosis not present

## 2016-09-02 DIAGNOSIS — N2581 Secondary hyperparathyroidism of renal origin: Secondary | ICD-10-CM | POA: Diagnosis not present

## 2016-09-02 DIAGNOSIS — D509 Iron deficiency anemia, unspecified: Secondary | ICD-10-CM | POA: Diagnosis not present

## 2016-09-06 DIAGNOSIS — D509 Iron deficiency anemia, unspecified: Secondary | ICD-10-CM | POA: Diagnosis not present

## 2016-09-06 DIAGNOSIS — D631 Anemia in chronic kidney disease: Secondary | ICD-10-CM | POA: Diagnosis not present

## 2016-09-06 DIAGNOSIS — N2581 Secondary hyperparathyroidism of renal origin: Secondary | ICD-10-CM | POA: Diagnosis not present

## 2016-09-06 DIAGNOSIS — N186 End stage renal disease: Secondary | ICD-10-CM | POA: Diagnosis not present

## 2016-09-09 DIAGNOSIS — D509 Iron deficiency anemia, unspecified: Secondary | ICD-10-CM | POA: Diagnosis not present

## 2016-09-09 DIAGNOSIS — N2581 Secondary hyperparathyroidism of renal origin: Secondary | ICD-10-CM | POA: Diagnosis not present

## 2016-09-09 DIAGNOSIS — N186 End stage renal disease: Secondary | ICD-10-CM | POA: Diagnosis not present

## 2016-09-09 DIAGNOSIS — D631 Anemia in chronic kidney disease: Secondary | ICD-10-CM | POA: Diagnosis not present

## 2016-09-11 DIAGNOSIS — N2581 Secondary hyperparathyroidism of renal origin: Secondary | ICD-10-CM | POA: Diagnosis not present

## 2016-09-11 DIAGNOSIS — N186 End stage renal disease: Secondary | ICD-10-CM | POA: Diagnosis not present

## 2016-09-11 DIAGNOSIS — D509 Iron deficiency anemia, unspecified: Secondary | ICD-10-CM | POA: Diagnosis not present

## 2016-09-11 DIAGNOSIS — D631 Anemia in chronic kidney disease: Secondary | ICD-10-CM | POA: Diagnosis not present

## 2016-09-13 DIAGNOSIS — N2581 Secondary hyperparathyroidism of renal origin: Secondary | ICD-10-CM | POA: Diagnosis not present

## 2016-09-13 DIAGNOSIS — D509 Iron deficiency anemia, unspecified: Secondary | ICD-10-CM | POA: Diagnosis not present

## 2016-09-13 DIAGNOSIS — D631 Anemia in chronic kidney disease: Secondary | ICD-10-CM | POA: Diagnosis not present

## 2016-09-13 DIAGNOSIS — N186 End stage renal disease: Secondary | ICD-10-CM | POA: Diagnosis not present

## 2016-09-16 DIAGNOSIS — N2581 Secondary hyperparathyroidism of renal origin: Secondary | ICD-10-CM | POA: Diagnosis not present

## 2016-09-16 DIAGNOSIS — N186 End stage renal disease: Secondary | ICD-10-CM | POA: Diagnosis not present

## 2016-09-16 DIAGNOSIS — D631 Anemia in chronic kidney disease: Secondary | ICD-10-CM | POA: Diagnosis not present

## 2016-09-16 DIAGNOSIS — D509 Iron deficiency anemia, unspecified: Secondary | ICD-10-CM | POA: Diagnosis not present

## 2016-09-18 DIAGNOSIS — D509 Iron deficiency anemia, unspecified: Secondary | ICD-10-CM | POA: Diagnosis not present

## 2016-09-18 DIAGNOSIS — D631 Anemia in chronic kidney disease: Secondary | ICD-10-CM | POA: Diagnosis not present

## 2016-09-18 DIAGNOSIS — N2581 Secondary hyperparathyroidism of renal origin: Secondary | ICD-10-CM | POA: Diagnosis not present

## 2016-09-18 DIAGNOSIS — N186 End stage renal disease: Secondary | ICD-10-CM | POA: Diagnosis not present

## 2016-09-20 DIAGNOSIS — N2581 Secondary hyperparathyroidism of renal origin: Secondary | ICD-10-CM | POA: Diagnosis not present

## 2016-09-20 DIAGNOSIS — N186 End stage renal disease: Secondary | ICD-10-CM | POA: Diagnosis not present

## 2016-09-20 DIAGNOSIS — D631 Anemia in chronic kidney disease: Secondary | ICD-10-CM | POA: Diagnosis not present

## 2016-09-20 DIAGNOSIS — D509 Iron deficiency anemia, unspecified: Secondary | ICD-10-CM | POA: Diagnosis not present

## 2016-09-23 DIAGNOSIS — N2581 Secondary hyperparathyroidism of renal origin: Secondary | ICD-10-CM | POA: Diagnosis not present

## 2016-09-23 DIAGNOSIS — N186 End stage renal disease: Secondary | ICD-10-CM | POA: Diagnosis not present

## 2016-09-23 DIAGNOSIS — D509 Iron deficiency anemia, unspecified: Secondary | ICD-10-CM | POA: Diagnosis not present

## 2016-09-23 DIAGNOSIS — D631 Anemia in chronic kidney disease: Secondary | ICD-10-CM | POA: Diagnosis not present

## 2016-09-25 DIAGNOSIS — D509 Iron deficiency anemia, unspecified: Secondary | ICD-10-CM | POA: Diagnosis not present

## 2016-09-25 DIAGNOSIS — N2581 Secondary hyperparathyroidism of renal origin: Secondary | ICD-10-CM | POA: Diagnosis not present

## 2016-09-25 DIAGNOSIS — D631 Anemia in chronic kidney disease: Secondary | ICD-10-CM | POA: Diagnosis not present

## 2016-09-25 DIAGNOSIS — N186 End stage renal disease: Secondary | ICD-10-CM | POA: Diagnosis not present

## 2016-09-27 DIAGNOSIS — D631 Anemia in chronic kidney disease: Secondary | ICD-10-CM | POA: Diagnosis not present

## 2016-09-27 DIAGNOSIS — N186 End stage renal disease: Secondary | ICD-10-CM | POA: Diagnosis not present

## 2016-09-27 DIAGNOSIS — N2581 Secondary hyperparathyroidism of renal origin: Secondary | ICD-10-CM | POA: Diagnosis not present

## 2016-09-27 DIAGNOSIS — D509 Iron deficiency anemia, unspecified: Secondary | ICD-10-CM | POA: Diagnosis not present

## 2016-09-28 DIAGNOSIS — N033 Chronic nephritic syndrome with diffuse mesangial proliferative glomerulonephritis: Secondary | ICD-10-CM | POA: Diagnosis not present

## 2016-09-28 DIAGNOSIS — Z992 Dependence on renal dialysis: Secondary | ICD-10-CM | POA: Diagnosis not present

## 2016-09-28 DIAGNOSIS — N186 End stage renal disease: Secondary | ICD-10-CM | POA: Diagnosis not present

## 2016-09-30 DIAGNOSIS — N186 End stage renal disease: Secondary | ICD-10-CM | POA: Diagnosis not present

## 2016-09-30 DIAGNOSIS — N2581 Secondary hyperparathyroidism of renal origin: Secondary | ICD-10-CM | POA: Diagnosis not present

## 2016-09-30 DIAGNOSIS — D509 Iron deficiency anemia, unspecified: Secondary | ICD-10-CM | POA: Diagnosis not present

## 2016-10-02 DIAGNOSIS — N186 End stage renal disease: Secondary | ICD-10-CM | POA: Diagnosis not present

## 2016-10-02 DIAGNOSIS — D509 Iron deficiency anemia, unspecified: Secondary | ICD-10-CM | POA: Diagnosis not present

## 2016-10-02 DIAGNOSIS — N2581 Secondary hyperparathyroidism of renal origin: Secondary | ICD-10-CM | POA: Diagnosis not present

## 2016-10-04 DIAGNOSIS — N186 End stage renal disease: Secondary | ICD-10-CM | POA: Diagnosis not present

## 2016-10-04 DIAGNOSIS — N2581 Secondary hyperparathyroidism of renal origin: Secondary | ICD-10-CM | POA: Diagnosis not present

## 2016-10-04 DIAGNOSIS — D509 Iron deficiency anemia, unspecified: Secondary | ICD-10-CM | POA: Diagnosis not present

## 2016-10-06 DIAGNOSIS — N2581 Secondary hyperparathyroidism of renal origin: Secondary | ICD-10-CM | POA: Diagnosis not present

## 2016-10-06 DIAGNOSIS — N186 End stage renal disease: Secondary | ICD-10-CM | POA: Diagnosis not present

## 2016-10-06 DIAGNOSIS — D509 Iron deficiency anemia, unspecified: Secondary | ICD-10-CM | POA: Diagnosis not present

## 2016-10-08 DIAGNOSIS — N2581 Secondary hyperparathyroidism of renal origin: Secondary | ICD-10-CM | POA: Diagnosis not present

## 2016-10-08 DIAGNOSIS — D509 Iron deficiency anemia, unspecified: Secondary | ICD-10-CM | POA: Diagnosis not present

## 2016-10-08 DIAGNOSIS — N186 End stage renal disease: Secondary | ICD-10-CM | POA: Diagnosis not present

## 2016-10-10 DIAGNOSIS — N2581 Secondary hyperparathyroidism of renal origin: Secondary | ICD-10-CM | POA: Diagnosis not present

## 2016-10-10 DIAGNOSIS — D509 Iron deficiency anemia, unspecified: Secondary | ICD-10-CM | POA: Diagnosis not present

## 2016-10-10 DIAGNOSIS — N186 End stage renal disease: Secondary | ICD-10-CM | POA: Diagnosis not present

## 2016-10-13 DIAGNOSIS — D509 Iron deficiency anemia, unspecified: Secondary | ICD-10-CM | POA: Diagnosis not present

## 2016-10-13 DIAGNOSIS — N186 End stage renal disease: Secondary | ICD-10-CM | POA: Diagnosis not present

## 2016-10-13 DIAGNOSIS — N2581 Secondary hyperparathyroidism of renal origin: Secondary | ICD-10-CM | POA: Diagnosis not present

## 2016-10-15 DIAGNOSIS — D509 Iron deficiency anemia, unspecified: Secondary | ICD-10-CM | POA: Diagnosis not present

## 2016-10-15 DIAGNOSIS — N186 End stage renal disease: Secondary | ICD-10-CM | POA: Diagnosis not present

## 2016-10-15 DIAGNOSIS — N2581 Secondary hyperparathyroidism of renal origin: Secondary | ICD-10-CM | POA: Diagnosis not present

## 2016-10-17 DIAGNOSIS — D509 Iron deficiency anemia, unspecified: Secondary | ICD-10-CM | POA: Diagnosis not present

## 2016-10-17 DIAGNOSIS — N2581 Secondary hyperparathyroidism of renal origin: Secondary | ICD-10-CM | POA: Diagnosis not present

## 2016-10-17 DIAGNOSIS — N186 End stage renal disease: Secondary | ICD-10-CM | POA: Diagnosis not present

## 2016-10-20 DIAGNOSIS — N2581 Secondary hyperparathyroidism of renal origin: Secondary | ICD-10-CM | POA: Diagnosis not present

## 2016-10-20 DIAGNOSIS — D509 Iron deficiency anemia, unspecified: Secondary | ICD-10-CM | POA: Diagnosis not present

## 2016-10-20 DIAGNOSIS — N186 End stage renal disease: Secondary | ICD-10-CM | POA: Diagnosis not present

## 2016-10-22 DIAGNOSIS — N186 End stage renal disease: Secondary | ICD-10-CM | POA: Diagnosis not present

## 2016-10-22 DIAGNOSIS — N2581 Secondary hyperparathyroidism of renal origin: Secondary | ICD-10-CM | POA: Diagnosis not present

## 2016-10-22 DIAGNOSIS — D509 Iron deficiency anemia, unspecified: Secondary | ICD-10-CM | POA: Diagnosis not present

## 2016-10-24 DIAGNOSIS — N186 End stage renal disease: Secondary | ICD-10-CM | POA: Diagnosis not present

## 2016-10-24 DIAGNOSIS — N2581 Secondary hyperparathyroidism of renal origin: Secondary | ICD-10-CM | POA: Diagnosis not present

## 2016-10-24 DIAGNOSIS — D509 Iron deficiency anemia, unspecified: Secondary | ICD-10-CM | POA: Diagnosis not present

## 2016-10-27 DIAGNOSIS — N2581 Secondary hyperparathyroidism of renal origin: Secondary | ICD-10-CM | POA: Diagnosis not present

## 2016-10-27 DIAGNOSIS — D509 Iron deficiency anemia, unspecified: Secondary | ICD-10-CM | POA: Diagnosis not present

## 2016-10-27 DIAGNOSIS — N186 End stage renal disease: Secondary | ICD-10-CM | POA: Diagnosis not present

## 2016-10-29 DIAGNOSIS — D509 Iron deficiency anemia, unspecified: Secondary | ICD-10-CM | POA: Diagnosis not present

## 2016-10-29 DIAGNOSIS — N033 Chronic nephritic syndrome with diffuse mesangial proliferative glomerulonephritis: Secondary | ICD-10-CM | POA: Diagnosis not present

## 2016-10-29 DIAGNOSIS — N2581 Secondary hyperparathyroidism of renal origin: Secondary | ICD-10-CM | POA: Diagnosis not present

## 2016-10-29 DIAGNOSIS — Z992 Dependence on renal dialysis: Secondary | ICD-10-CM | POA: Diagnosis not present

## 2016-10-29 DIAGNOSIS — N186 End stage renal disease: Secondary | ICD-10-CM | POA: Diagnosis not present

## 2016-10-31 DIAGNOSIS — N186 End stage renal disease: Secondary | ICD-10-CM | POA: Diagnosis not present

## 2016-10-31 DIAGNOSIS — N2581 Secondary hyperparathyroidism of renal origin: Secondary | ICD-10-CM | POA: Diagnosis not present

## 2016-10-31 DIAGNOSIS — D509 Iron deficiency anemia, unspecified: Secondary | ICD-10-CM | POA: Diagnosis not present

## 2016-10-31 DIAGNOSIS — D631 Anemia in chronic kidney disease: Secondary | ICD-10-CM | POA: Diagnosis not present

## 2016-10-31 DIAGNOSIS — R7309 Other abnormal glucose: Secondary | ICD-10-CM | POA: Diagnosis not present

## 2016-10-31 DIAGNOSIS — Z23 Encounter for immunization: Secondary | ICD-10-CM | POA: Diagnosis not present

## 2016-11-03 DIAGNOSIS — D631 Anemia in chronic kidney disease: Secondary | ICD-10-CM | POA: Diagnosis not present

## 2016-11-03 DIAGNOSIS — Z23 Encounter for immunization: Secondary | ICD-10-CM | POA: Diagnosis not present

## 2016-11-03 DIAGNOSIS — R7309 Other abnormal glucose: Secondary | ICD-10-CM | POA: Diagnosis not present

## 2016-11-03 DIAGNOSIS — D509 Iron deficiency anemia, unspecified: Secondary | ICD-10-CM | POA: Diagnosis not present

## 2016-11-03 DIAGNOSIS — N2581 Secondary hyperparathyroidism of renal origin: Secondary | ICD-10-CM | POA: Diagnosis not present

## 2016-11-03 DIAGNOSIS — N186 End stage renal disease: Secondary | ICD-10-CM | POA: Diagnosis not present

## 2016-11-05 DIAGNOSIS — D509 Iron deficiency anemia, unspecified: Secondary | ICD-10-CM | POA: Diagnosis not present

## 2016-11-05 DIAGNOSIS — D631 Anemia in chronic kidney disease: Secondary | ICD-10-CM | POA: Diagnosis not present

## 2016-11-05 DIAGNOSIS — R7309 Other abnormal glucose: Secondary | ICD-10-CM | POA: Diagnosis not present

## 2016-11-05 DIAGNOSIS — N2581 Secondary hyperparathyroidism of renal origin: Secondary | ICD-10-CM | POA: Diagnosis not present

## 2016-11-05 DIAGNOSIS — N186 End stage renal disease: Secondary | ICD-10-CM | POA: Diagnosis not present

## 2016-11-05 DIAGNOSIS — Z23 Encounter for immunization: Secondary | ICD-10-CM | POA: Diagnosis not present

## 2016-11-07 DIAGNOSIS — Z23 Encounter for immunization: Secondary | ICD-10-CM | POA: Diagnosis not present

## 2016-11-07 DIAGNOSIS — N186 End stage renal disease: Secondary | ICD-10-CM | POA: Diagnosis not present

## 2016-11-07 DIAGNOSIS — R7309 Other abnormal glucose: Secondary | ICD-10-CM | POA: Diagnosis not present

## 2016-11-07 DIAGNOSIS — N2581 Secondary hyperparathyroidism of renal origin: Secondary | ICD-10-CM | POA: Diagnosis not present

## 2016-11-07 DIAGNOSIS — D509 Iron deficiency anemia, unspecified: Secondary | ICD-10-CM | POA: Diagnosis not present

## 2016-11-07 DIAGNOSIS — D631 Anemia in chronic kidney disease: Secondary | ICD-10-CM | POA: Diagnosis not present

## 2016-11-12 DIAGNOSIS — N2581 Secondary hyperparathyroidism of renal origin: Secondary | ICD-10-CM | POA: Diagnosis not present

## 2016-11-12 DIAGNOSIS — Z23 Encounter for immunization: Secondary | ICD-10-CM | POA: Diagnosis not present

## 2016-11-12 DIAGNOSIS — D509 Iron deficiency anemia, unspecified: Secondary | ICD-10-CM | POA: Diagnosis not present

## 2016-11-12 DIAGNOSIS — N186 End stage renal disease: Secondary | ICD-10-CM | POA: Diagnosis not present

## 2016-11-12 DIAGNOSIS — D631 Anemia in chronic kidney disease: Secondary | ICD-10-CM | POA: Diagnosis not present

## 2016-11-12 DIAGNOSIS — R7309 Other abnormal glucose: Secondary | ICD-10-CM | POA: Diagnosis not present

## 2016-11-14 DIAGNOSIS — R7309 Other abnormal glucose: Secondary | ICD-10-CM | POA: Diagnosis not present

## 2016-11-14 DIAGNOSIS — N186 End stage renal disease: Secondary | ICD-10-CM | POA: Diagnosis not present

## 2016-11-14 DIAGNOSIS — Z23 Encounter for immunization: Secondary | ICD-10-CM | POA: Diagnosis not present

## 2016-11-14 DIAGNOSIS — D631 Anemia in chronic kidney disease: Secondary | ICD-10-CM | POA: Diagnosis not present

## 2016-11-14 DIAGNOSIS — N2581 Secondary hyperparathyroidism of renal origin: Secondary | ICD-10-CM | POA: Diagnosis not present

## 2016-11-14 DIAGNOSIS — D509 Iron deficiency anemia, unspecified: Secondary | ICD-10-CM | POA: Diagnosis not present

## 2016-11-18 DIAGNOSIS — D509 Iron deficiency anemia, unspecified: Secondary | ICD-10-CM | POA: Diagnosis not present

## 2016-11-18 DIAGNOSIS — N2581 Secondary hyperparathyroidism of renal origin: Secondary | ICD-10-CM | POA: Diagnosis not present

## 2016-11-18 DIAGNOSIS — N186 End stage renal disease: Secondary | ICD-10-CM | POA: Diagnosis not present

## 2016-11-18 DIAGNOSIS — Z23 Encounter for immunization: Secondary | ICD-10-CM | POA: Diagnosis not present

## 2016-11-18 DIAGNOSIS — R7309 Other abnormal glucose: Secondary | ICD-10-CM | POA: Diagnosis not present

## 2016-11-18 DIAGNOSIS — D631 Anemia in chronic kidney disease: Secondary | ICD-10-CM | POA: Diagnosis not present

## 2016-11-19 DIAGNOSIS — R7309 Other abnormal glucose: Secondary | ICD-10-CM | POA: Diagnosis not present

## 2016-11-19 DIAGNOSIS — Z23 Encounter for immunization: Secondary | ICD-10-CM | POA: Diagnosis not present

## 2016-11-19 DIAGNOSIS — D509 Iron deficiency anemia, unspecified: Secondary | ICD-10-CM | POA: Diagnosis not present

## 2016-11-19 DIAGNOSIS — N2581 Secondary hyperparathyroidism of renal origin: Secondary | ICD-10-CM | POA: Diagnosis not present

## 2016-11-19 DIAGNOSIS — D631 Anemia in chronic kidney disease: Secondary | ICD-10-CM | POA: Diagnosis not present

## 2016-11-19 DIAGNOSIS — N186 End stage renal disease: Secondary | ICD-10-CM | POA: Diagnosis not present

## 2016-11-21 DIAGNOSIS — R7309 Other abnormal glucose: Secondary | ICD-10-CM | POA: Diagnosis not present

## 2016-11-21 DIAGNOSIS — D509 Iron deficiency anemia, unspecified: Secondary | ICD-10-CM | POA: Diagnosis not present

## 2016-11-21 DIAGNOSIS — N2581 Secondary hyperparathyroidism of renal origin: Secondary | ICD-10-CM | POA: Diagnosis not present

## 2016-11-21 DIAGNOSIS — D631 Anemia in chronic kidney disease: Secondary | ICD-10-CM | POA: Diagnosis not present

## 2016-11-21 DIAGNOSIS — N186 End stage renal disease: Secondary | ICD-10-CM | POA: Diagnosis not present

## 2016-11-21 DIAGNOSIS — Z23 Encounter for immunization: Secondary | ICD-10-CM | POA: Diagnosis not present

## 2016-11-24 DIAGNOSIS — R7309 Other abnormal glucose: Secondary | ICD-10-CM | POA: Diagnosis not present

## 2016-11-24 DIAGNOSIS — D631 Anemia in chronic kidney disease: Secondary | ICD-10-CM | POA: Diagnosis not present

## 2016-11-24 DIAGNOSIS — N186 End stage renal disease: Secondary | ICD-10-CM | POA: Diagnosis not present

## 2016-11-24 DIAGNOSIS — D509 Iron deficiency anemia, unspecified: Secondary | ICD-10-CM | POA: Diagnosis not present

## 2016-11-24 DIAGNOSIS — N2581 Secondary hyperparathyroidism of renal origin: Secondary | ICD-10-CM | POA: Diagnosis not present

## 2016-11-24 DIAGNOSIS — Z23 Encounter for immunization: Secondary | ICD-10-CM | POA: Diagnosis not present

## 2016-11-26 DIAGNOSIS — Z992 Dependence on renal dialysis: Secondary | ICD-10-CM | POA: Diagnosis not present

## 2016-11-26 DIAGNOSIS — N2581 Secondary hyperparathyroidism of renal origin: Secondary | ICD-10-CM | POA: Diagnosis not present

## 2016-11-26 DIAGNOSIS — Z23 Encounter for immunization: Secondary | ICD-10-CM | POA: Diagnosis not present

## 2016-11-26 DIAGNOSIS — D509 Iron deficiency anemia, unspecified: Secondary | ICD-10-CM | POA: Diagnosis not present

## 2016-11-26 DIAGNOSIS — R7309 Other abnormal glucose: Secondary | ICD-10-CM | POA: Diagnosis not present

## 2016-11-26 DIAGNOSIS — D631 Anemia in chronic kidney disease: Secondary | ICD-10-CM | POA: Diagnosis not present

## 2016-11-26 DIAGNOSIS — N033 Chronic nephritic syndrome with diffuse mesangial proliferative glomerulonephritis: Secondary | ICD-10-CM | POA: Diagnosis not present

## 2016-11-26 DIAGNOSIS — N186 End stage renal disease: Secondary | ICD-10-CM | POA: Diagnosis not present

## 2016-11-28 DIAGNOSIS — D509 Iron deficiency anemia, unspecified: Secondary | ICD-10-CM | POA: Diagnosis not present

## 2016-11-28 DIAGNOSIS — N186 End stage renal disease: Secondary | ICD-10-CM | POA: Diagnosis not present

## 2016-11-28 DIAGNOSIS — E161 Other hypoglycemia: Secondary | ICD-10-CM | POA: Diagnosis not present

## 2016-11-28 DIAGNOSIS — N2581 Secondary hyperparathyroidism of renal origin: Secondary | ICD-10-CM | POA: Diagnosis not present

## 2016-11-28 DIAGNOSIS — D631 Anemia in chronic kidney disease: Secondary | ICD-10-CM | POA: Diagnosis not present

## 2016-12-01 DIAGNOSIS — D631 Anemia in chronic kidney disease: Secondary | ICD-10-CM | POA: Diagnosis not present

## 2016-12-01 DIAGNOSIS — N186 End stage renal disease: Secondary | ICD-10-CM | POA: Diagnosis not present

## 2016-12-01 DIAGNOSIS — N2581 Secondary hyperparathyroidism of renal origin: Secondary | ICD-10-CM | POA: Diagnosis not present

## 2016-12-01 DIAGNOSIS — E161 Other hypoglycemia: Secondary | ICD-10-CM | POA: Diagnosis not present

## 2016-12-01 DIAGNOSIS — D509 Iron deficiency anemia, unspecified: Secondary | ICD-10-CM | POA: Diagnosis not present

## 2016-12-03 DIAGNOSIS — E161 Other hypoglycemia: Secondary | ICD-10-CM | POA: Diagnosis not present

## 2016-12-03 DIAGNOSIS — N186 End stage renal disease: Secondary | ICD-10-CM | POA: Diagnosis not present

## 2016-12-03 DIAGNOSIS — D631 Anemia in chronic kidney disease: Secondary | ICD-10-CM | POA: Diagnosis not present

## 2016-12-03 DIAGNOSIS — D509 Iron deficiency anemia, unspecified: Secondary | ICD-10-CM | POA: Diagnosis not present

## 2016-12-03 DIAGNOSIS — N2581 Secondary hyperparathyroidism of renal origin: Secondary | ICD-10-CM | POA: Diagnosis not present

## 2016-12-05 DIAGNOSIS — D631 Anemia in chronic kidney disease: Secondary | ICD-10-CM | POA: Diagnosis not present

## 2016-12-05 DIAGNOSIS — N186 End stage renal disease: Secondary | ICD-10-CM | POA: Diagnosis not present

## 2016-12-05 DIAGNOSIS — N2581 Secondary hyperparathyroidism of renal origin: Secondary | ICD-10-CM | POA: Diagnosis not present

## 2016-12-05 DIAGNOSIS — E161 Other hypoglycemia: Secondary | ICD-10-CM | POA: Diagnosis not present

## 2016-12-05 DIAGNOSIS — D509 Iron deficiency anemia, unspecified: Secondary | ICD-10-CM | POA: Diagnosis not present

## 2016-12-08 DIAGNOSIS — N186 End stage renal disease: Secondary | ICD-10-CM | POA: Diagnosis not present

## 2016-12-08 DIAGNOSIS — E161 Other hypoglycemia: Secondary | ICD-10-CM | POA: Diagnosis not present

## 2016-12-08 DIAGNOSIS — N2581 Secondary hyperparathyroidism of renal origin: Secondary | ICD-10-CM | POA: Diagnosis not present

## 2016-12-08 DIAGNOSIS — D631 Anemia in chronic kidney disease: Secondary | ICD-10-CM | POA: Diagnosis not present

## 2016-12-08 DIAGNOSIS — D509 Iron deficiency anemia, unspecified: Secondary | ICD-10-CM | POA: Diagnosis not present

## 2016-12-10 DIAGNOSIS — N186 End stage renal disease: Secondary | ICD-10-CM | POA: Diagnosis not present

## 2016-12-10 DIAGNOSIS — D509 Iron deficiency anemia, unspecified: Secondary | ICD-10-CM | POA: Diagnosis not present

## 2016-12-10 DIAGNOSIS — E161 Other hypoglycemia: Secondary | ICD-10-CM | POA: Diagnosis not present

## 2016-12-10 DIAGNOSIS — D631 Anemia in chronic kidney disease: Secondary | ICD-10-CM | POA: Diagnosis not present

## 2016-12-10 DIAGNOSIS — N2581 Secondary hyperparathyroidism of renal origin: Secondary | ICD-10-CM | POA: Diagnosis not present

## 2016-12-11 DIAGNOSIS — E161 Other hypoglycemia: Secondary | ICD-10-CM | POA: Diagnosis not present

## 2016-12-11 DIAGNOSIS — N186 End stage renal disease: Secondary | ICD-10-CM | POA: Diagnosis not present

## 2016-12-11 DIAGNOSIS — D509 Iron deficiency anemia, unspecified: Secondary | ICD-10-CM | POA: Diagnosis not present

## 2016-12-11 DIAGNOSIS — N2581 Secondary hyperparathyroidism of renal origin: Secondary | ICD-10-CM | POA: Diagnosis not present

## 2016-12-11 DIAGNOSIS — D631 Anemia in chronic kidney disease: Secondary | ICD-10-CM | POA: Diagnosis not present

## 2016-12-15 DIAGNOSIS — N2581 Secondary hyperparathyroidism of renal origin: Secondary | ICD-10-CM | POA: Diagnosis not present

## 2016-12-15 DIAGNOSIS — D509 Iron deficiency anemia, unspecified: Secondary | ICD-10-CM | POA: Diagnosis not present

## 2016-12-15 DIAGNOSIS — D631 Anemia in chronic kidney disease: Secondary | ICD-10-CM | POA: Diagnosis not present

## 2016-12-15 DIAGNOSIS — E161 Other hypoglycemia: Secondary | ICD-10-CM | POA: Diagnosis not present

## 2016-12-15 DIAGNOSIS — N186 End stage renal disease: Secondary | ICD-10-CM | POA: Diagnosis not present

## 2016-12-17 DIAGNOSIS — D509 Iron deficiency anemia, unspecified: Secondary | ICD-10-CM | POA: Diagnosis not present

## 2016-12-17 DIAGNOSIS — E161 Other hypoglycemia: Secondary | ICD-10-CM | POA: Diagnosis not present

## 2016-12-17 DIAGNOSIS — N2581 Secondary hyperparathyroidism of renal origin: Secondary | ICD-10-CM | POA: Diagnosis not present

## 2016-12-17 DIAGNOSIS — N186 End stage renal disease: Secondary | ICD-10-CM | POA: Diagnosis not present

## 2016-12-17 DIAGNOSIS — D631 Anemia in chronic kidney disease: Secondary | ICD-10-CM | POA: Diagnosis not present

## 2016-12-19 DIAGNOSIS — D631 Anemia in chronic kidney disease: Secondary | ICD-10-CM | POA: Diagnosis not present

## 2016-12-19 DIAGNOSIS — N2581 Secondary hyperparathyroidism of renal origin: Secondary | ICD-10-CM | POA: Diagnosis not present

## 2016-12-19 DIAGNOSIS — N186 End stage renal disease: Secondary | ICD-10-CM | POA: Diagnosis not present

## 2016-12-19 DIAGNOSIS — E161 Other hypoglycemia: Secondary | ICD-10-CM | POA: Diagnosis not present

## 2016-12-19 DIAGNOSIS — D509 Iron deficiency anemia, unspecified: Secondary | ICD-10-CM | POA: Diagnosis not present

## 2016-12-22 DIAGNOSIS — D509 Iron deficiency anemia, unspecified: Secondary | ICD-10-CM | POA: Diagnosis not present

## 2016-12-22 DIAGNOSIS — D631 Anemia in chronic kidney disease: Secondary | ICD-10-CM | POA: Diagnosis not present

## 2016-12-22 DIAGNOSIS — E161 Other hypoglycemia: Secondary | ICD-10-CM | POA: Diagnosis not present

## 2016-12-22 DIAGNOSIS — N2581 Secondary hyperparathyroidism of renal origin: Secondary | ICD-10-CM | POA: Diagnosis not present

## 2016-12-22 DIAGNOSIS — N186 End stage renal disease: Secondary | ICD-10-CM | POA: Diagnosis not present

## 2016-12-24 DIAGNOSIS — D631 Anemia in chronic kidney disease: Secondary | ICD-10-CM | POA: Diagnosis not present

## 2016-12-24 DIAGNOSIS — N186 End stage renal disease: Secondary | ICD-10-CM | POA: Diagnosis not present

## 2016-12-24 DIAGNOSIS — N2581 Secondary hyperparathyroidism of renal origin: Secondary | ICD-10-CM | POA: Diagnosis not present

## 2016-12-24 DIAGNOSIS — E161 Other hypoglycemia: Secondary | ICD-10-CM | POA: Diagnosis not present

## 2016-12-24 DIAGNOSIS — D509 Iron deficiency anemia, unspecified: Secondary | ICD-10-CM | POA: Diagnosis not present

## 2016-12-26 DIAGNOSIS — N186 End stage renal disease: Secondary | ICD-10-CM | POA: Diagnosis not present

## 2016-12-26 DIAGNOSIS — N2581 Secondary hyperparathyroidism of renal origin: Secondary | ICD-10-CM | POA: Diagnosis not present

## 2016-12-26 DIAGNOSIS — E161 Other hypoglycemia: Secondary | ICD-10-CM | POA: Diagnosis not present

## 2016-12-26 DIAGNOSIS — D509 Iron deficiency anemia, unspecified: Secondary | ICD-10-CM | POA: Diagnosis not present

## 2016-12-26 DIAGNOSIS — D631 Anemia in chronic kidney disease: Secondary | ICD-10-CM | POA: Diagnosis not present

## 2016-12-27 DIAGNOSIS — N033 Chronic nephritic syndrome with diffuse mesangial proliferative glomerulonephritis: Secondary | ICD-10-CM | POA: Diagnosis not present

## 2016-12-27 DIAGNOSIS — N186 End stage renal disease: Secondary | ICD-10-CM | POA: Diagnosis not present

## 2016-12-27 DIAGNOSIS — Z992 Dependence on renal dialysis: Secondary | ICD-10-CM | POA: Diagnosis not present

## 2016-12-29 DIAGNOSIS — N2581 Secondary hyperparathyroidism of renal origin: Secondary | ICD-10-CM | POA: Diagnosis not present

## 2016-12-29 DIAGNOSIS — D509 Iron deficiency anemia, unspecified: Secondary | ICD-10-CM | POA: Diagnosis not present

## 2016-12-29 DIAGNOSIS — N186 End stage renal disease: Secondary | ICD-10-CM | POA: Diagnosis not present

## 2016-12-31 DIAGNOSIS — D509 Iron deficiency anemia, unspecified: Secondary | ICD-10-CM | POA: Diagnosis not present

## 2016-12-31 DIAGNOSIS — N2581 Secondary hyperparathyroidism of renal origin: Secondary | ICD-10-CM | POA: Diagnosis not present

## 2016-12-31 DIAGNOSIS — N186 End stage renal disease: Secondary | ICD-10-CM | POA: Diagnosis not present

## 2017-01-02 DIAGNOSIS — N186 End stage renal disease: Secondary | ICD-10-CM | POA: Diagnosis not present

## 2017-01-02 DIAGNOSIS — N2581 Secondary hyperparathyroidism of renal origin: Secondary | ICD-10-CM | POA: Diagnosis not present

## 2017-01-02 DIAGNOSIS — D509 Iron deficiency anemia, unspecified: Secondary | ICD-10-CM | POA: Diagnosis not present

## 2017-01-05 DIAGNOSIS — N186 End stage renal disease: Secondary | ICD-10-CM | POA: Diagnosis not present

## 2017-01-05 DIAGNOSIS — N2581 Secondary hyperparathyroidism of renal origin: Secondary | ICD-10-CM | POA: Diagnosis not present

## 2017-01-05 DIAGNOSIS — D509 Iron deficiency anemia, unspecified: Secondary | ICD-10-CM | POA: Diagnosis not present

## 2017-01-07 DIAGNOSIS — N186 End stage renal disease: Secondary | ICD-10-CM | POA: Diagnosis not present

## 2017-01-07 DIAGNOSIS — D509 Iron deficiency anemia, unspecified: Secondary | ICD-10-CM | POA: Diagnosis not present

## 2017-01-07 DIAGNOSIS — N2581 Secondary hyperparathyroidism of renal origin: Secondary | ICD-10-CM | POA: Diagnosis not present

## 2017-01-09 DIAGNOSIS — N2581 Secondary hyperparathyroidism of renal origin: Secondary | ICD-10-CM | POA: Diagnosis not present

## 2017-01-09 DIAGNOSIS — N186 End stage renal disease: Secondary | ICD-10-CM | POA: Diagnosis not present

## 2017-01-09 DIAGNOSIS — D509 Iron deficiency anemia, unspecified: Secondary | ICD-10-CM | POA: Diagnosis not present

## 2017-01-12 DIAGNOSIS — N2581 Secondary hyperparathyroidism of renal origin: Secondary | ICD-10-CM | POA: Diagnosis not present

## 2017-01-12 DIAGNOSIS — N186 End stage renal disease: Secondary | ICD-10-CM | POA: Diagnosis not present

## 2017-01-12 DIAGNOSIS — D509 Iron deficiency anemia, unspecified: Secondary | ICD-10-CM | POA: Diagnosis not present

## 2017-01-14 DIAGNOSIS — N2581 Secondary hyperparathyroidism of renal origin: Secondary | ICD-10-CM | POA: Diagnosis not present

## 2017-01-14 DIAGNOSIS — D509 Iron deficiency anemia, unspecified: Secondary | ICD-10-CM | POA: Diagnosis not present

## 2017-01-14 DIAGNOSIS — N186 End stage renal disease: Secondary | ICD-10-CM | POA: Diagnosis not present

## 2017-01-16 DIAGNOSIS — D509 Iron deficiency anemia, unspecified: Secondary | ICD-10-CM | POA: Diagnosis not present

## 2017-01-16 DIAGNOSIS — N186 End stage renal disease: Secondary | ICD-10-CM | POA: Diagnosis not present

## 2017-01-16 DIAGNOSIS — N2581 Secondary hyperparathyroidism of renal origin: Secondary | ICD-10-CM | POA: Diagnosis not present

## 2017-01-19 DIAGNOSIS — D509 Iron deficiency anemia, unspecified: Secondary | ICD-10-CM | POA: Diagnosis not present

## 2017-01-19 DIAGNOSIS — N2581 Secondary hyperparathyroidism of renal origin: Secondary | ICD-10-CM | POA: Diagnosis not present

## 2017-01-19 DIAGNOSIS — N186 End stage renal disease: Secondary | ICD-10-CM | POA: Diagnosis not present

## 2017-01-21 DIAGNOSIS — N186 End stage renal disease: Secondary | ICD-10-CM | POA: Diagnosis not present

## 2017-01-21 DIAGNOSIS — D509 Iron deficiency anemia, unspecified: Secondary | ICD-10-CM | POA: Diagnosis not present

## 2017-01-21 DIAGNOSIS — N2581 Secondary hyperparathyroidism of renal origin: Secondary | ICD-10-CM | POA: Diagnosis not present

## 2017-01-23 DIAGNOSIS — N186 End stage renal disease: Secondary | ICD-10-CM | POA: Diagnosis not present

## 2017-01-23 DIAGNOSIS — D509 Iron deficiency anemia, unspecified: Secondary | ICD-10-CM | POA: Diagnosis not present

## 2017-01-23 DIAGNOSIS — N2581 Secondary hyperparathyroidism of renal origin: Secondary | ICD-10-CM | POA: Diagnosis not present

## 2017-01-26 DIAGNOSIS — N2581 Secondary hyperparathyroidism of renal origin: Secondary | ICD-10-CM | POA: Diagnosis not present

## 2017-01-26 DIAGNOSIS — D509 Iron deficiency anemia, unspecified: Secondary | ICD-10-CM | POA: Diagnosis not present

## 2017-01-26 DIAGNOSIS — Z992 Dependence on renal dialysis: Secondary | ICD-10-CM | POA: Diagnosis not present

## 2017-01-26 DIAGNOSIS — N033 Chronic nephritic syndrome with diffuse mesangial proliferative glomerulonephritis: Secondary | ICD-10-CM | POA: Diagnosis not present

## 2017-01-26 DIAGNOSIS — N186 End stage renal disease: Secondary | ICD-10-CM | POA: Diagnosis not present

## 2017-01-28 DIAGNOSIS — D509 Iron deficiency anemia, unspecified: Secondary | ICD-10-CM | POA: Diagnosis not present

## 2017-01-28 DIAGNOSIS — N2581 Secondary hyperparathyroidism of renal origin: Secondary | ICD-10-CM | POA: Diagnosis not present

## 2017-01-28 DIAGNOSIS — D631 Anemia in chronic kidney disease: Secondary | ICD-10-CM | POA: Diagnosis not present

## 2017-01-28 DIAGNOSIS — N186 End stage renal disease: Secondary | ICD-10-CM | POA: Diagnosis not present

## 2017-01-31 DIAGNOSIS — D631 Anemia in chronic kidney disease: Secondary | ICD-10-CM | POA: Diagnosis not present

## 2017-01-31 DIAGNOSIS — N2581 Secondary hyperparathyroidism of renal origin: Secondary | ICD-10-CM | POA: Diagnosis not present

## 2017-01-31 DIAGNOSIS — D509 Iron deficiency anemia, unspecified: Secondary | ICD-10-CM | POA: Diagnosis not present

## 2017-01-31 DIAGNOSIS — N186 End stage renal disease: Secondary | ICD-10-CM | POA: Diagnosis not present

## 2017-02-02 DIAGNOSIS — N2581 Secondary hyperparathyroidism of renal origin: Secondary | ICD-10-CM | POA: Diagnosis not present

## 2017-02-02 DIAGNOSIS — N186 End stage renal disease: Secondary | ICD-10-CM | POA: Diagnosis not present

## 2017-02-02 DIAGNOSIS — D631 Anemia in chronic kidney disease: Secondary | ICD-10-CM | POA: Diagnosis not present

## 2017-02-02 DIAGNOSIS — D509 Iron deficiency anemia, unspecified: Secondary | ICD-10-CM | POA: Diagnosis not present

## 2017-02-04 DIAGNOSIS — N186 End stage renal disease: Secondary | ICD-10-CM | POA: Diagnosis not present

## 2017-02-04 DIAGNOSIS — D509 Iron deficiency anemia, unspecified: Secondary | ICD-10-CM | POA: Diagnosis not present

## 2017-02-04 DIAGNOSIS — D631 Anemia in chronic kidney disease: Secondary | ICD-10-CM | POA: Diagnosis not present

## 2017-02-04 DIAGNOSIS — N2581 Secondary hyperparathyroidism of renal origin: Secondary | ICD-10-CM | POA: Diagnosis not present

## 2017-02-06 DIAGNOSIS — D509 Iron deficiency anemia, unspecified: Secondary | ICD-10-CM | POA: Diagnosis not present

## 2017-02-06 DIAGNOSIS — N2581 Secondary hyperparathyroidism of renal origin: Secondary | ICD-10-CM | POA: Diagnosis not present

## 2017-02-06 DIAGNOSIS — N186 End stage renal disease: Secondary | ICD-10-CM | POA: Diagnosis not present

## 2017-02-06 DIAGNOSIS — D631 Anemia in chronic kidney disease: Secondary | ICD-10-CM | POA: Diagnosis not present

## 2017-02-09 DIAGNOSIS — D631 Anemia in chronic kidney disease: Secondary | ICD-10-CM | POA: Diagnosis not present

## 2017-02-09 DIAGNOSIS — N186 End stage renal disease: Secondary | ICD-10-CM | POA: Diagnosis not present

## 2017-02-09 DIAGNOSIS — D509 Iron deficiency anemia, unspecified: Secondary | ICD-10-CM | POA: Diagnosis not present

## 2017-02-09 DIAGNOSIS — N2581 Secondary hyperparathyroidism of renal origin: Secondary | ICD-10-CM | POA: Diagnosis not present

## 2017-02-11 DIAGNOSIS — D631 Anemia in chronic kidney disease: Secondary | ICD-10-CM | POA: Diagnosis not present

## 2017-02-11 DIAGNOSIS — N2581 Secondary hyperparathyroidism of renal origin: Secondary | ICD-10-CM | POA: Diagnosis not present

## 2017-02-11 DIAGNOSIS — N186 End stage renal disease: Secondary | ICD-10-CM | POA: Diagnosis not present

## 2017-02-11 DIAGNOSIS — D509 Iron deficiency anemia, unspecified: Secondary | ICD-10-CM | POA: Diagnosis not present

## 2017-02-14 DIAGNOSIS — D631 Anemia in chronic kidney disease: Secondary | ICD-10-CM | POA: Diagnosis not present

## 2017-02-14 DIAGNOSIS — N2581 Secondary hyperparathyroidism of renal origin: Secondary | ICD-10-CM | POA: Diagnosis not present

## 2017-02-14 DIAGNOSIS — D509 Iron deficiency anemia, unspecified: Secondary | ICD-10-CM | POA: Diagnosis not present

## 2017-02-14 DIAGNOSIS — N186 End stage renal disease: Secondary | ICD-10-CM | POA: Diagnosis not present

## 2017-02-16 DIAGNOSIS — N2581 Secondary hyperparathyroidism of renal origin: Secondary | ICD-10-CM | POA: Diagnosis not present

## 2017-02-16 DIAGNOSIS — D631 Anemia in chronic kidney disease: Secondary | ICD-10-CM | POA: Diagnosis not present

## 2017-02-16 DIAGNOSIS — N186 End stage renal disease: Secondary | ICD-10-CM | POA: Diagnosis not present

## 2017-02-16 DIAGNOSIS — D509 Iron deficiency anemia, unspecified: Secondary | ICD-10-CM | POA: Diagnosis not present

## 2017-02-18 DIAGNOSIS — N186 End stage renal disease: Secondary | ICD-10-CM | POA: Diagnosis not present

## 2017-02-18 DIAGNOSIS — D509 Iron deficiency anemia, unspecified: Secondary | ICD-10-CM | POA: Diagnosis not present

## 2017-02-18 DIAGNOSIS — D631 Anemia in chronic kidney disease: Secondary | ICD-10-CM | POA: Diagnosis not present

## 2017-02-18 DIAGNOSIS — N2581 Secondary hyperparathyroidism of renal origin: Secondary | ICD-10-CM | POA: Diagnosis not present

## 2017-02-20 DIAGNOSIS — D509 Iron deficiency anemia, unspecified: Secondary | ICD-10-CM | POA: Diagnosis not present

## 2017-02-20 DIAGNOSIS — D631 Anemia in chronic kidney disease: Secondary | ICD-10-CM | POA: Diagnosis not present

## 2017-02-20 DIAGNOSIS — N186 End stage renal disease: Secondary | ICD-10-CM | POA: Diagnosis not present

## 2017-02-20 DIAGNOSIS — N2581 Secondary hyperparathyroidism of renal origin: Secondary | ICD-10-CM | POA: Diagnosis not present

## 2017-02-23 DIAGNOSIS — N2581 Secondary hyperparathyroidism of renal origin: Secondary | ICD-10-CM | POA: Diagnosis not present

## 2017-02-23 DIAGNOSIS — D631 Anemia in chronic kidney disease: Secondary | ICD-10-CM | POA: Diagnosis not present

## 2017-02-23 DIAGNOSIS — N186 End stage renal disease: Secondary | ICD-10-CM | POA: Diagnosis not present

## 2017-02-23 DIAGNOSIS — D509 Iron deficiency anemia, unspecified: Secondary | ICD-10-CM | POA: Diagnosis not present

## 2017-02-25 DIAGNOSIS — D631 Anemia in chronic kidney disease: Secondary | ICD-10-CM | POA: Diagnosis not present

## 2017-02-25 DIAGNOSIS — D509 Iron deficiency anemia, unspecified: Secondary | ICD-10-CM | POA: Diagnosis not present

## 2017-02-25 DIAGNOSIS — N2581 Secondary hyperparathyroidism of renal origin: Secondary | ICD-10-CM | POA: Diagnosis not present

## 2017-02-25 DIAGNOSIS — N186 End stage renal disease: Secondary | ICD-10-CM | POA: Diagnosis not present

## 2017-02-26 DIAGNOSIS — Z992 Dependence on renal dialysis: Secondary | ICD-10-CM | POA: Diagnosis not present

## 2017-02-26 DIAGNOSIS — N186 End stage renal disease: Secondary | ICD-10-CM | POA: Diagnosis not present

## 2017-02-26 DIAGNOSIS — N033 Chronic nephritic syndrome with diffuse mesangial proliferative glomerulonephritis: Secondary | ICD-10-CM | POA: Diagnosis not present

## 2017-02-27 DIAGNOSIS — D631 Anemia in chronic kidney disease: Secondary | ICD-10-CM | POA: Diagnosis not present

## 2017-02-27 DIAGNOSIS — N186 End stage renal disease: Secondary | ICD-10-CM | POA: Diagnosis not present

## 2017-02-27 DIAGNOSIS — N2581 Secondary hyperparathyroidism of renal origin: Secondary | ICD-10-CM | POA: Diagnosis not present

## 2017-02-27 DIAGNOSIS — D509 Iron deficiency anemia, unspecified: Secondary | ICD-10-CM | POA: Diagnosis not present

## 2017-03-02 DIAGNOSIS — D631 Anemia in chronic kidney disease: Secondary | ICD-10-CM | POA: Diagnosis not present

## 2017-03-02 DIAGNOSIS — N2581 Secondary hyperparathyroidism of renal origin: Secondary | ICD-10-CM | POA: Diagnosis not present

## 2017-03-02 DIAGNOSIS — N186 End stage renal disease: Secondary | ICD-10-CM | POA: Diagnosis not present

## 2017-03-02 DIAGNOSIS — D509 Iron deficiency anemia, unspecified: Secondary | ICD-10-CM | POA: Diagnosis not present

## 2017-03-04 DIAGNOSIS — N186 End stage renal disease: Secondary | ICD-10-CM | POA: Diagnosis not present

## 2017-03-04 DIAGNOSIS — N2581 Secondary hyperparathyroidism of renal origin: Secondary | ICD-10-CM | POA: Diagnosis not present

## 2017-03-04 DIAGNOSIS — D631 Anemia in chronic kidney disease: Secondary | ICD-10-CM | POA: Diagnosis not present

## 2017-03-04 DIAGNOSIS — D509 Iron deficiency anemia, unspecified: Secondary | ICD-10-CM | POA: Diagnosis not present

## 2017-03-06 DIAGNOSIS — D631 Anemia in chronic kidney disease: Secondary | ICD-10-CM | POA: Diagnosis not present

## 2017-03-06 DIAGNOSIS — N2581 Secondary hyperparathyroidism of renal origin: Secondary | ICD-10-CM | POA: Diagnosis not present

## 2017-03-06 DIAGNOSIS — N186 End stage renal disease: Secondary | ICD-10-CM | POA: Diagnosis not present

## 2017-03-06 DIAGNOSIS — D509 Iron deficiency anemia, unspecified: Secondary | ICD-10-CM | POA: Diagnosis not present

## 2017-03-10 DIAGNOSIS — N2581 Secondary hyperparathyroidism of renal origin: Secondary | ICD-10-CM | POA: Diagnosis not present

## 2017-03-10 DIAGNOSIS — D509 Iron deficiency anemia, unspecified: Secondary | ICD-10-CM | POA: Diagnosis not present

## 2017-03-10 DIAGNOSIS — D631 Anemia in chronic kidney disease: Secondary | ICD-10-CM | POA: Diagnosis not present

## 2017-03-10 DIAGNOSIS — N186 End stage renal disease: Secondary | ICD-10-CM | POA: Diagnosis not present

## 2017-03-11 DIAGNOSIS — D509 Iron deficiency anemia, unspecified: Secondary | ICD-10-CM | POA: Diagnosis not present

## 2017-03-11 DIAGNOSIS — N186 End stage renal disease: Secondary | ICD-10-CM | POA: Diagnosis not present

## 2017-03-11 DIAGNOSIS — N2581 Secondary hyperparathyroidism of renal origin: Secondary | ICD-10-CM | POA: Diagnosis not present

## 2017-03-11 DIAGNOSIS — D631 Anemia in chronic kidney disease: Secondary | ICD-10-CM | POA: Diagnosis not present

## 2017-03-13 DIAGNOSIS — D509 Iron deficiency anemia, unspecified: Secondary | ICD-10-CM | POA: Diagnosis not present

## 2017-03-13 DIAGNOSIS — N2581 Secondary hyperparathyroidism of renal origin: Secondary | ICD-10-CM | POA: Diagnosis not present

## 2017-03-13 DIAGNOSIS — N186 End stage renal disease: Secondary | ICD-10-CM | POA: Diagnosis not present

## 2017-03-13 DIAGNOSIS — D631 Anemia in chronic kidney disease: Secondary | ICD-10-CM | POA: Diagnosis not present

## 2017-03-16 DIAGNOSIS — D631 Anemia in chronic kidney disease: Secondary | ICD-10-CM | POA: Diagnosis not present

## 2017-03-16 DIAGNOSIS — N2581 Secondary hyperparathyroidism of renal origin: Secondary | ICD-10-CM | POA: Diagnosis not present

## 2017-03-16 DIAGNOSIS — N186 End stage renal disease: Secondary | ICD-10-CM | POA: Diagnosis not present

## 2017-03-16 DIAGNOSIS — D509 Iron deficiency anemia, unspecified: Secondary | ICD-10-CM | POA: Diagnosis not present

## 2017-03-18 DIAGNOSIS — D631 Anemia in chronic kidney disease: Secondary | ICD-10-CM | POA: Diagnosis not present

## 2017-03-18 DIAGNOSIS — D509 Iron deficiency anemia, unspecified: Secondary | ICD-10-CM | POA: Diagnosis not present

## 2017-03-18 DIAGNOSIS — N2581 Secondary hyperparathyroidism of renal origin: Secondary | ICD-10-CM | POA: Diagnosis not present

## 2017-03-18 DIAGNOSIS — N186 End stage renal disease: Secondary | ICD-10-CM | POA: Diagnosis not present

## 2017-03-20 DIAGNOSIS — N2581 Secondary hyperparathyroidism of renal origin: Secondary | ICD-10-CM | POA: Diagnosis not present

## 2017-03-20 DIAGNOSIS — D509 Iron deficiency anemia, unspecified: Secondary | ICD-10-CM | POA: Diagnosis not present

## 2017-03-20 DIAGNOSIS — D631 Anemia in chronic kidney disease: Secondary | ICD-10-CM | POA: Diagnosis not present

## 2017-03-20 DIAGNOSIS — N186 End stage renal disease: Secondary | ICD-10-CM | POA: Diagnosis not present

## 2017-03-23 DIAGNOSIS — N2581 Secondary hyperparathyroidism of renal origin: Secondary | ICD-10-CM | POA: Diagnosis not present

## 2017-03-23 DIAGNOSIS — D631 Anemia in chronic kidney disease: Secondary | ICD-10-CM | POA: Diagnosis not present

## 2017-03-23 DIAGNOSIS — N186 End stage renal disease: Secondary | ICD-10-CM | POA: Diagnosis not present

## 2017-03-23 DIAGNOSIS — D509 Iron deficiency anemia, unspecified: Secondary | ICD-10-CM | POA: Diagnosis not present

## 2017-03-25 DIAGNOSIS — D631 Anemia in chronic kidney disease: Secondary | ICD-10-CM | POA: Diagnosis not present

## 2017-03-25 DIAGNOSIS — N2581 Secondary hyperparathyroidism of renal origin: Secondary | ICD-10-CM | POA: Diagnosis not present

## 2017-03-25 DIAGNOSIS — N186 End stage renal disease: Secondary | ICD-10-CM | POA: Diagnosis not present

## 2017-03-25 DIAGNOSIS — D509 Iron deficiency anemia, unspecified: Secondary | ICD-10-CM | POA: Diagnosis not present

## 2017-03-27 DIAGNOSIS — D631 Anemia in chronic kidney disease: Secondary | ICD-10-CM | POA: Diagnosis not present

## 2017-03-27 DIAGNOSIS — D509 Iron deficiency anemia, unspecified: Secondary | ICD-10-CM | POA: Diagnosis not present

## 2017-03-27 DIAGNOSIS — N2581 Secondary hyperparathyroidism of renal origin: Secondary | ICD-10-CM | POA: Diagnosis not present

## 2017-03-27 DIAGNOSIS — N186 End stage renal disease: Secondary | ICD-10-CM | POA: Diagnosis not present

## 2017-03-28 DIAGNOSIS — Z992 Dependence on renal dialysis: Secondary | ICD-10-CM | POA: Diagnosis not present

## 2017-03-28 DIAGNOSIS — N033 Chronic nephritic syndrome with diffuse mesangial proliferative glomerulonephritis: Secondary | ICD-10-CM | POA: Diagnosis not present

## 2017-03-28 DIAGNOSIS — N186 End stage renal disease: Secondary | ICD-10-CM | POA: Diagnosis not present

## 2017-03-30 DIAGNOSIS — D509 Iron deficiency anemia, unspecified: Secondary | ICD-10-CM | POA: Diagnosis not present

## 2017-03-30 DIAGNOSIS — N2581 Secondary hyperparathyroidism of renal origin: Secondary | ICD-10-CM | POA: Diagnosis not present

## 2017-03-30 DIAGNOSIS — D631 Anemia in chronic kidney disease: Secondary | ICD-10-CM | POA: Diagnosis not present

## 2017-03-30 DIAGNOSIS — N186 End stage renal disease: Secondary | ICD-10-CM | POA: Diagnosis not present

## 2017-04-01 DIAGNOSIS — N186 End stage renal disease: Secondary | ICD-10-CM | POA: Diagnosis not present

## 2017-04-01 DIAGNOSIS — D509 Iron deficiency anemia, unspecified: Secondary | ICD-10-CM | POA: Diagnosis not present

## 2017-04-01 DIAGNOSIS — N2581 Secondary hyperparathyroidism of renal origin: Secondary | ICD-10-CM | POA: Diagnosis not present

## 2017-04-01 DIAGNOSIS — D631 Anemia in chronic kidney disease: Secondary | ICD-10-CM | POA: Diagnosis not present

## 2017-04-03 DIAGNOSIS — N2581 Secondary hyperparathyroidism of renal origin: Secondary | ICD-10-CM | POA: Diagnosis not present

## 2017-04-03 DIAGNOSIS — D631 Anemia in chronic kidney disease: Secondary | ICD-10-CM | POA: Diagnosis not present

## 2017-04-03 DIAGNOSIS — D509 Iron deficiency anemia, unspecified: Secondary | ICD-10-CM | POA: Diagnosis not present

## 2017-04-03 DIAGNOSIS — N186 End stage renal disease: Secondary | ICD-10-CM | POA: Diagnosis not present

## 2017-04-06 DIAGNOSIS — D631 Anemia in chronic kidney disease: Secondary | ICD-10-CM | POA: Diagnosis not present

## 2017-04-06 DIAGNOSIS — N186 End stage renal disease: Secondary | ICD-10-CM | POA: Diagnosis not present

## 2017-04-06 DIAGNOSIS — D509 Iron deficiency anemia, unspecified: Secondary | ICD-10-CM | POA: Diagnosis not present

## 2017-04-06 DIAGNOSIS — N2581 Secondary hyperparathyroidism of renal origin: Secondary | ICD-10-CM | POA: Diagnosis not present

## 2017-04-08 DIAGNOSIS — N186 End stage renal disease: Secondary | ICD-10-CM | POA: Diagnosis not present

## 2017-04-08 DIAGNOSIS — N2581 Secondary hyperparathyroidism of renal origin: Secondary | ICD-10-CM | POA: Diagnosis not present

## 2017-04-08 DIAGNOSIS — D509 Iron deficiency anemia, unspecified: Secondary | ICD-10-CM | POA: Diagnosis not present

## 2017-04-08 DIAGNOSIS — D631 Anemia in chronic kidney disease: Secondary | ICD-10-CM | POA: Diagnosis not present

## 2017-04-10 DIAGNOSIS — N2581 Secondary hyperparathyroidism of renal origin: Secondary | ICD-10-CM | POA: Diagnosis not present

## 2017-04-10 DIAGNOSIS — D509 Iron deficiency anemia, unspecified: Secondary | ICD-10-CM | POA: Diagnosis not present

## 2017-04-10 DIAGNOSIS — N186 End stage renal disease: Secondary | ICD-10-CM | POA: Diagnosis not present

## 2017-04-10 DIAGNOSIS — D631 Anemia in chronic kidney disease: Secondary | ICD-10-CM | POA: Diagnosis not present

## 2017-04-14 DIAGNOSIS — N2581 Secondary hyperparathyroidism of renal origin: Secondary | ICD-10-CM | POA: Diagnosis not present

## 2017-04-14 DIAGNOSIS — D509 Iron deficiency anemia, unspecified: Secondary | ICD-10-CM | POA: Diagnosis not present

## 2017-04-14 DIAGNOSIS — D631 Anemia in chronic kidney disease: Secondary | ICD-10-CM | POA: Diagnosis not present

## 2017-04-14 DIAGNOSIS — N186 End stage renal disease: Secondary | ICD-10-CM | POA: Diagnosis not present

## 2017-04-15 DIAGNOSIS — N186 End stage renal disease: Secondary | ICD-10-CM | POA: Diagnosis not present

## 2017-04-15 DIAGNOSIS — D509 Iron deficiency anemia, unspecified: Secondary | ICD-10-CM | POA: Diagnosis not present

## 2017-04-15 DIAGNOSIS — D631 Anemia in chronic kidney disease: Secondary | ICD-10-CM | POA: Diagnosis not present

## 2017-04-15 DIAGNOSIS — N2581 Secondary hyperparathyroidism of renal origin: Secondary | ICD-10-CM | POA: Diagnosis not present

## 2017-04-17 DIAGNOSIS — D509 Iron deficiency anemia, unspecified: Secondary | ICD-10-CM | POA: Diagnosis not present

## 2017-04-17 DIAGNOSIS — N2581 Secondary hyperparathyroidism of renal origin: Secondary | ICD-10-CM | POA: Diagnosis not present

## 2017-04-17 DIAGNOSIS — N186 End stage renal disease: Secondary | ICD-10-CM | POA: Diagnosis not present

## 2017-04-17 DIAGNOSIS — D631 Anemia in chronic kidney disease: Secondary | ICD-10-CM | POA: Diagnosis not present

## 2017-04-20 DIAGNOSIS — N186 End stage renal disease: Secondary | ICD-10-CM | POA: Diagnosis not present

## 2017-04-20 DIAGNOSIS — N2581 Secondary hyperparathyroidism of renal origin: Secondary | ICD-10-CM | POA: Diagnosis not present

## 2017-04-20 DIAGNOSIS — D631 Anemia in chronic kidney disease: Secondary | ICD-10-CM | POA: Diagnosis not present

## 2017-04-20 DIAGNOSIS — D509 Iron deficiency anemia, unspecified: Secondary | ICD-10-CM | POA: Diagnosis not present

## 2017-04-22 DIAGNOSIS — N186 End stage renal disease: Secondary | ICD-10-CM | POA: Diagnosis not present

## 2017-04-22 DIAGNOSIS — D509 Iron deficiency anemia, unspecified: Secondary | ICD-10-CM | POA: Diagnosis not present

## 2017-04-22 DIAGNOSIS — D631 Anemia in chronic kidney disease: Secondary | ICD-10-CM | POA: Diagnosis not present

## 2017-04-22 DIAGNOSIS — N2581 Secondary hyperparathyroidism of renal origin: Secondary | ICD-10-CM | POA: Diagnosis not present

## 2017-04-24 DIAGNOSIS — N186 End stage renal disease: Secondary | ICD-10-CM | POA: Diagnosis not present

## 2017-04-24 DIAGNOSIS — D509 Iron deficiency anemia, unspecified: Secondary | ICD-10-CM | POA: Diagnosis not present

## 2017-04-24 DIAGNOSIS — N2581 Secondary hyperparathyroidism of renal origin: Secondary | ICD-10-CM | POA: Diagnosis not present

## 2017-04-24 DIAGNOSIS — D631 Anemia in chronic kidney disease: Secondary | ICD-10-CM | POA: Diagnosis not present

## 2017-04-27 DIAGNOSIS — D509 Iron deficiency anemia, unspecified: Secondary | ICD-10-CM | POA: Diagnosis not present

## 2017-04-27 DIAGNOSIS — N186 End stage renal disease: Secondary | ICD-10-CM | POA: Diagnosis not present

## 2017-04-27 DIAGNOSIS — N2581 Secondary hyperparathyroidism of renal origin: Secondary | ICD-10-CM | POA: Diagnosis not present

## 2017-04-27 DIAGNOSIS — D631 Anemia in chronic kidney disease: Secondary | ICD-10-CM | POA: Diagnosis not present

## 2017-04-28 DIAGNOSIS — N186 End stage renal disease: Secondary | ICD-10-CM | POA: Diagnosis not present

## 2017-04-28 DIAGNOSIS — N033 Chronic nephritic syndrome with diffuse mesangial proliferative glomerulonephritis: Secondary | ICD-10-CM | POA: Diagnosis not present

## 2017-04-28 DIAGNOSIS — Z992 Dependence on renal dialysis: Secondary | ICD-10-CM | POA: Diagnosis not present

## 2017-04-29 DIAGNOSIS — D631 Anemia in chronic kidney disease: Secondary | ICD-10-CM | POA: Diagnosis not present

## 2017-04-29 DIAGNOSIS — N186 End stage renal disease: Secondary | ICD-10-CM | POA: Diagnosis not present

## 2017-04-29 DIAGNOSIS — D509 Iron deficiency anemia, unspecified: Secondary | ICD-10-CM | POA: Diagnosis not present

## 2017-04-29 DIAGNOSIS — N2581 Secondary hyperparathyroidism of renal origin: Secondary | ICD-10-CM | POA: Diagnosis not present

## 2017-05-01 DIAGNOSIS — N186 End stage renal disease: Secondary | ICD-10-CM | POA: Diagnosis not present

## 2017-05-01 DIAGNOSIS — N2581 Secondary hyperparathyroidism of renal origin: Secondary | ICD-10-CM | POA: Diagnosis not present

## 2017-05-01 DIAGNOSIS — D631 Anemia in chronic kidney disease: Secondary | ICD-10-CM | POA: Diagnosis not present

## 2017-05-01 DIAGNOSIS — D509 Iron deficiency anemia, unspecified: Secondary | ICD-10-CM | POA: Diagnosis not present

## 2017-05-04 DIAGNOSIS — N2581 Secondary hyperparathyroidism of renal origin: Secondary | ICD-10-CM | POA: Diagnosis not present

## 2017-05-04 DIAGNOSIS — N186 End stage renal disease: Secondary | ICD-10-CM | POA: Diagnosis not present

## 2017-05-04 DIAGNOSIS — D631 Anemia in chronic kidney disease: Secondary | ICD-10-CM | POA: Diagnosis not present

## 2017-05-04 DIAGNOSIS — D509 Iron deficiency anemia, unspecified: Secondary | ICD-10-CM | POA: Diagnosis not present

## 2017-05-06 DIAGNOSIS — D631 Anemia in chronic kidney disease: Secondary | ICD-10-CM | POA: Diagnosis not present

## 2017-05-06 DIAGNOSIS — D509 Iron deficiency anemia, unspecified: Secondary | ICD-10-CM | POA: Diagnosis not present

## 2017-05-06 DIAGNOSIS — N186 End stage renal disease: Secondary | ICD-10-CM | POA: Diagnosis not present

## 2017-05-06 DIAGNOSIS — N2581 Secondary hyperparathyroidism of renal origin: Secondary | ICD-10-CM | POA: Diagnosis not present

## 2017-05-08 DIAGNOSIS — N2581 Secondary hyperparathyroidism of renal origin: Secondary | ICD-10-CM | POA: Diagnosis not present

## 2017-05-08 DIAGNOSIS — N186 End stage renal disease: Secondary | ICD-10-CM | POA: Diagnosis not present

## 2017-05-08 DIAGNOSIS — D509 Iron deficiency anemia, unspecified: Secondary | ICD-10-CM | POA: Diagnosis not present

## 2017-05-08 DIAGNOSIS — D631 Anemia in chronic kidney disease: Secondary | ICD-10-CM | POA: Diagnosis not present

## 2017-05-13 DIAGNOSIS — N2581 Secondary hyperparathyroidism of renal origin: Secondary | ICD-10-CM | POA: Diagnosis not present

## 2017-05-13 DIAGNOSIS — N186 End stage renal disease: Secondary | ICD-10-CM | POA: Diagnosis not present

## 2017-05-13 DIAGNOSIS — D631 Anemia in chronic kidney disease: Secondary | ICD-10-CM | POA: Diagnosis not present

## 2017-05-13 DIAGNOSIS — D509 Iron deficiency anemia, unspecified: Secondary | ICD-10-CM | POA: Diagnosis not present

## 2017-05-15 DIAGNOSIS — D509 Iron deficiency anemia, unspecified: Secondary | ICD-10-CM | POA: Diagnosis not present

## 2017-05-15 DIAGNOSIS — D631 Anemia in chronic kidney disease: Secondary | ICD-10-CM | POA: Diagnosis not present

## 2017-05-15 DIAGNOSIS — N186 End stage renal disease: Secondary | ICD-10-CM | POA: Diagnosis not present

## 2017-05-15 DIAGNOSIS — N2581 Secondary hyperparathyroidism of renal origin: Secondary | ICD-10-CM | POA: Diagnosis not present

## 2017-05-18 DIAGNOSIS — D631 Anemia in chronic kidney disease: Secondary | ICD-10-CM | POA: Diagnosis not present

## 2017-05-18 DIAGNOSIS — N2581 Secondary hyperparathyroidism of renal origin: Secondary | ICD-10-CM | POA: Diagnosis not present

## 2017-05-18 DIAGNOSIS — D509 Iron deficiency anemia, unspecified: Secondary | ICD-10-CM | POA: Diagnosis not present

## 2017-05-18 DIAGNOSIS — N186 End stage renal disease: Secondary | ICD-10-CM | POA: Diagnosis not present

## 2017-05-20 DIAGNOSIS — D631 Anemia in chronic kidney disease: Secondary | ICD-10-CM | POA: Diagnosis not present

## 2017-05-20 DIAGNOSIS — N2581 Secondary hyperparathyroidism of renal origin: Secondary | ICD-10-CM | POA: Diagnosis not present

## 2017-05-20 DIAGNOSIS — D509 Iron deficiency anemia, unspecified: Secondary | ICD-10-CM | POA: Diagnosis not present

## 2017-05-20 DIAGNOSIS — N186 End stage renal disease: Secondary | ICD-10-CM | POA: Diagnosis not present

## 2017-05-21 DIAGNOSIS — D631 Anemia in chronic kidney disease: Secondary | ICD-10-CM | POA: Diagnosis not present

## 2017-05-21 DIAGNOSIS — D509 Iron deficiency anemia, unspecified: Secondary | ICD-10-CM | POA: Diagnosis not present

## 2017-05-21 DIAGNOSIS — N186 End stage renal disease: Secondary | ICD-10-CM | POA: Diagnosis not present

## 2017-05-21 DIAGNOSIS — N2581 Secondary hyperparathyroidism of renal origin: Secondary | ICD-10-CM | POA: Diagnosis not present

## 2017-05-25 DIAGNOSIS — N186 End stage renal disease: Secondary | ICD-10-CM | POA: Diagnosis not present

## 2017-05-25 DIAGNOSIS — D631 Anemia in chronic kidney disease: Secondary | ICD-10-CM | POA: Diagnosis not present

## 2017-05-25 DIAGNOSIS — D509 Iron deficiency anemia, unspecified: Secondary | ICD-10-CM | POA: Diagnosis not present

## 2017-05-25 DIAGNOSIS — N2581 Secondary hyperparathyroidism of renal origin: Secondary | ICD-10-CM | POA: Diagnosis not present

## 2017-05-27 DIAGNOSIS — D509 Iron deficiency anemia, unspecified: Secondary | ICD-10-CM | POA: Diagnosis not present

## 2017-05-27 DIAGNOSIS — N2581 Secondary hyperparathyroidism of renal origin: Secondary | ICD-10-CM | POA: Diagnosis not present

## 2017-05-27 DIAGNOSIS — N186 End stage renal disease: Secondary | ICD-10-CM | POA: Diagnosis not present

## 2017-05-27 DIAGNOSIS — D631 Anemia in chronic kidney disease: Secondary | ICD-10-CM | POA: Diagnosis not present

## 2017-05-29 DIAGNOSIS — D631 Anemia in chronic kidney disease: Secondary | ICD-10-CM | POA: Diagnosis not present

## 2017-05-29 DIAGNOSIS — N186 End stage renal disease: Secondary | ICD-10-CM | POA: Diagnosis not present

## 2017-05-29 DIAGNOSIS — Z992 Dependence on renal dialysis: Secondary | ICD-10-CM | POA: Diagnosis not present

## 2017-05-29 DIAGNOSIS — N2581 Secondary hyperparathyroidism of renal origin: Secondary | ICD-10-CM | POA: Diagnosis not present

## 2017-05-29 DIAGNOSIS — N033 Chronic nephritic syndrome with diffuse mesangial proliferative glomerulonephritis: Secondary | ICD-10-CM | POA: Diagnosis not present

## 2017-05-29 DIAGNOSIS — D509 Iron deficiency anemia, unspecified: Secondary | ICD-10-CM | POA: Diagnosis not present

## 2017-06-01 DIAGNOSIS — D631 Anemia in chronic kidney disease: Secondary | ICD-10-CM | POA: Diagnosis not present

## 2017-06-01 DIAGNOSIS — N186 End stage renal disease: Secondary | ICD-10-CM | POA: Diagnosis not present

## 2017-06-01 DIAGNOSIS — N2581 Secondary hyperparathyroidism of renal origin: Secondary | ICD-10-CM | POA: Diagnosis not present

## 2017-06-01 DIAGNOSIS — Z23 Encounter for immunization: Secondary | ICD-10-CM | POA: Diagnosis not present

## 2017-06-01 DIAGNOSIS — D509 Iron deficiency anemia, unspecified: Secondary | ICD-10-CM | POA: Diagnosis not present

## 2017-06-03 DIAGNOSIS — D631 Anemia in chronic kidney disease: Secondary | ICD-10-CM | POA: Diagnosis not present

## 2017-06-03 DIAGNOSIS — N2581 Secondary hyperparathyroidism of renal origin: Secondary | ICD-10-CM | POA: Diagnosis not present

## 2017-06-03 DIAGNOSIS — Z23 Encounter for immunization: Secondary | ICD-10-CM | POA: Diagnosis not present

## 2017-06-03 DIAGNOSIS — D509 Iron deficiency anemia, unspecified: Secondary | ICD-10-CM | POA: Diagnosis not present

## 2017-06-03 DIAGNOSIS — N186 End stage renal disease: Secondary | ICD-10-CM | POA: Diagnosis not present

## 2017-06-05 DIAGNOSIS — N2581 Secondary hyperparathyroidism of renal origin: Secondary | ICD-10-CM | POA: Diagnosis not present

## 2017-06-05 DIAGNOSIS — N186 End stage renal disease: Secondary | ICD-10-CM | POA: Diagnosis not present

## 2017-06-05 DIAGNOSIS — D631 Anemia in chronic kidney disease: Secondary | ICD-10-CM | POA: Diagnosis not present

## 2017-06-05 DIAGNOSIS — Z23 Encounter for immunization: Secondary | ICD-10-CM | POA: Diagnosis not present

## 2017-06-05 DIAGNOSIS — D509 Iron deficiency anemia, unspecified: Secondary | ICD-10-CM | POA: Diagnosis not present

## 2017-06-08 DIAGNOSIS — N186 End stage renal disease: Secondary | ICD-10-CM | POA: Diagnosis not present

## 2017-06-08 DIAGNOSIS — D509 Iron deficiency anemia, unspecified: Secondary | ICD-10-CM | POA: Diagnosis not present

## 2017-06-08 DIAGNOSIS — N2581 Secondary hyperparathyroidism of renal origin: Secondary | ICD-10-CM | POA: Diagnosis not present

## 2017-06-08 DIAGNOSIS — Z23 Encounter for immunization: Secondary | ICD-10-CM | POA: Diagnosis not present

## 2017-06-08 DIAGNOSIS — D631 Anemia in chronic kidney disease: Secondary | ICD-10-CM | POA: Diagnosis not present

## 2017-06-10 DIAGNOSIS — D631 Anemia in chronic kidney disease: Secondary | ICD-10-CM | POA: Diagnosis not present

## 2017-06-10 DIAGNOSIS — D509 Iron deficiency anemia, unspecified: Secondary | ICD-10-CM | POA: Diagnosis not present

## 2017-06-10 DIAGNOSIS — N186 End stage renal disease: Secondary | ICD-10-CM | POA: Diagnosis not present

## 2017-06-10 DIAGNOSIS — N2581 Secondary hyperparathyroidism of renal origin: Secondary | ICD-10-CM | POA: Diagnosis not present

## 2017-06-10 DIAGNOSIS — Z23 Encounter for immunization: Secondary | ICD-10-CM | POA: Diagnosis not present

## 2017-06-12 DIAGNOSIS — N2581 Secondary hyperparathyroidism of renal origin: Secondary | ICD-10-CM | POA: Diagnosis not present

## 2017-06-12 DIAGNOSIS — D509 Iron deficiency anemia, unspecified: Secondary | ICD-10-CM | POA: Diagnosis not present

## 2017-06-12 DIAGNOSIS — N186 End stage renal disease: Secondary | ICD-10-CM | POA: Diagnosis not present

## 2017-06-12 DIAGNOSIS — D631 Anemia in chronic kidney disease: Secondary | ICD-10-CM | POA: Diagnosis not present

## 2017-06-12 DIAGNOSIS — Z23 Encounter for immunization: Secondary | ICD-10-CM | POA: Diagnosis not present

## 2017-06-15 DIAGNOSIS — N2581 Secondary hyperparathyroidism of renal origin: Secondary | ICD-10-CM | POA: Diagnosis not present

## 2017-06-15 DIAGNOSIS — D631 Anemia in chronic kidney disease: Secondary | ICD-10-CM | POA: Diagnosis not present

## 2017-06-15 DIAGNOSIS — D509 Iron deficiency anemia, unspecified: Secondary | ICD-10-CM | POA: Diagnosis not present

## 2017-06-15 DIAGNOSIS — Z23 Encounter for immunization: Secondary | ICD-10-CM | POA: Diagnosis not present

## 2017-06-15 DIAGNOSIS — N186 End stage renal disease: Secondary | ICD-10-CM | POA: Diagnosis not present

## 2017-06-17 DIAGNOSIS — D509 Iron deficiency anemia, unspecified: Secondary | ICD-10-CM | POA: Diagnosis not present

## 2017-06-17 DIAGNOSIS — N2581 Secondary hyperparathyroidism of renal origin: Secondary | ICD-10-CM | POA: Diagnosis not present

## 2017-06-17 DIAGNOSIS — D631 Anemia in chronic kidney disease: Secondary | ICD-10-CM | POA: Diagnosis not present

## 2017-06-17 DIAGNOSIS — Z23 Encounter for immunization: Secondary | ICD-10-CM | POA: Diagnosis not present

## 2017-06-17 DIAGNOSIS — N186 End stage renal disease: Secondary | ICD-10-CM | POA: Diagnosis not present

## 2017-06-19 DIAGNOSIS — Z23 Encounter for immunization: Secondary | ICD-10-CM | POA: Diagnosis not present

## 2017-06-19 DIAGNOSIS — N2581 Secondary hyperparathyroidism of renal origin: Secondary | ICD-10-CM | POA: Diagnosis not present

## 2017-06-19 DIAGNOSIS — D509 Iron deficiency anemia, unspecified: Secondary | ICD-10-CM | POA: Diagnosis not present

## 2017-06-19 DIAGNOSIS — D631 Anemia in chronic kidney disease: Secondary | ICD-10-CM | POA: Diagnosis not present

## 2017-06-19 DIAGNOSIS — N186 End stage renal disease: Secondary | ICD-10-CM | POA: Diagnosis not present

## 2017-06-22 DIAGNOSIS — N2581 Secondary hyperparathyroidism of renal origin: Secondary | ICD-10-CM | POA: Diagnosis not present

## 2017-06-22 DIAGNOSIS — Z23 Encounter for immunization: Secondary | ICD-10-CM | POA: Diagnosis not present

## 2017-06-22 DIAGNOSIS — D509 Iron deficiency anemia, unspecified: Secondary | ICD-10-CM | POA: Diagnosis not present

## 2017-06-22 DIAGNOSIS — N186 End stage renal disease: Secondary | ICD-10-CM | POA: Diagnosis not present

## 2017-06-22 DIAGNOSIS — D631 Anemia in chronic kidney disease: Secondary | ICD-10-CM | POA: Diagnosis not present

## 2017-06-24 DIAGNOSIS — N186 End stage renal disease: Secondary | ICD-10-CM | POA: Diagnosis not present

## 2017-06-24 DIAGNOSIS — D509 Iron deficiency anemia, unspecified: Secondary | ICD-10-CM | POA: Diagnosis not present

## 2017-06-24 DIAGNOSIS — N2581 Secondary hyperparathyroidism of renal origin: Secondary | ICD-10-CM | POA: Diagnosis not present

## 2017-06-24 DIAGNOSIS — Z23 Encounter for immunization: Secondary | ICD-10-CM | POA: Diagnosis not present

## 2017-06-24 DIAGNOSIS — D631 Anemia in chronic kidney disease: Secondary | ICD-10-CM | POA: Diagnosis not present

## 2017-06-26 DIAGNOSIS — Z23 Encounter for immunization: Secondary | ICD-10-CM | POA: Diagnosis not present

## 2017-06-26 DIAGNOSIS — D631 Anemia in chronic kidney disease: Secondary | ICD-10-CM | POA: Diagnosis not present

## 2017-06-26 DIAGNOSIS — N2581 Secondary hyperparathyroidism of renal origin: Secondary | ICD-10-CM | POA: Diagnosis not present

## 2017-06-26 DIAGNOSIS — D509 Iron deficiency anemia, unspecified: Secondary | ICD-10-CM | POA: Diagnosis not present

## 2017-06-26 DIAGNOSIS — N186 End stage renal disease: Secondary | ICD-10-CM | POA: Diagnosis not present

## 2017-06-28 DIAGNOSIS — N033 Chronic nephritic syndrome with diffuse mesangial proliferative glomerulonephritis: Secondary | ICD-10-CM | POA: Diagnosis not present

## 2017-06-28 DIAGNOSIS — Z992 Dependence on renal dialysis: Secondary | ICD-10-CM | POA: Diagnosis not present

## 2017-06-28 DIAGNOSIS — N186 End stage renal disease: Secondary | ICD-10-CM | POA: Diagnosis not present

## 2017-06-29 DIAGNOSIS — D631 Anemia in chronic kidney disease: Secondary | ICD-10-CM | POA: Diagnosis not present

## 2017-06-29 DIAGNOSIS — N2581 Secondary hyperparathyroidism of renal origin: Secondary | ICD-10-CM | POA: Diagnosis not present

## 2017-06-29 DIAGNOSIS — D509 Iron deficiency anemia, unspecified: Secondary | ICD-10-CM | POA: Diagnosis not present

## 2017-06-29 DIAGNOSIS — N186 End stage renal disease: Secondary | ICD-10-CM | POA: Diagnosis not present

## 2017-07-01 DIAGNOSIS — N186 End stage renal disease: Secondary | ICD-10-CM | POA: Diagnosis not present

## 2017-07-01 DIAGNOSIS — D509 Iron deficiency anemia, unspecified: Secondary | ICD-10-CM | POA: Diagnosis not present

## 2017-07-01 DIAGNOSIS — D631 Anemia in chronic kidney disease: Secondary | ICD-10-CM | POA: Diagnosis not present

## 2017-07-01 DIAGNOSIS — N2581 Secondary hyperparathyroidism of renal origin: Secondary | ICD-10-CM | POA: Diagnosis not present

## 2017-07-03 DIAGNOSIS — D631 Anemia in chronic kidney disease: Secondary | ICD-10-CM | POA: Diagnosis not present

## 2017-07-03 DIAGNOSIS — N186 End stage renal disease: Secondary | ICD-10-CM | POA: Diagnosis not present

## 2017-07-03 DIAGNOSIS — D509 Iron deficiency anemia, unspecified: Secondary | ICD-10-CM | POA: Diagnosis not present

## 2017-07-03 DIAGNOSIS — N2581 Secondary hyperparathyroidism of renal origin: Secondary | ICD-10-CM | POA: Diagnosis not present

## 2017-07-06 DIAGNOSIS — D509 Iron deficiency anemia, unspecified: Secondary | ICD-10-CM | POA: Diagnosis not present

## 2017-07-06 DIAGNOSIS — D631 Anemia in chronic kidney disease: Secondary | ICD-10-CM | POA: Diagnosis not present

## 2017-07-06 DIAGNOSIS — N186 End stage renal disease: Secondary | ICD-10-CM | POA: Diagnosis not present

## 2017-07-06 DIAGNOSIS — N2581 Secondary hyperparathyroidism of renal origin: Secondary | ICD-10-CM | POA: Diagnosis not present

## 2017-07-08 DIAGNOSIS — D509 Iron deficiency anemia, unspecified: Secondary | ICD-10-CM | POA: Diagnosis not present

## 2017-07-08 DIAGNOSIS — D631 Anemia in chronic kidney disease: Secondary | ICD-10-CM | POA: Diagnosis not present

## 2017-07-08 DIAGNOSIS — N186 End stage renal disease: Secondary | ICD-10-CM | POA: Diagnosis not present

## 2017-07-08 DIAGNOSIS — N2581 Secondary hyperparathyroidism of renal origin: Secondary | ICD-10-CM | POA: Diagnosis not present

## 2017-07-10 DIAGNOSIS — D509 Iron deficiency anemia, unspecified: Secondary | ICD-10-CM | POA: Diagnosis not present

## 2017-07-10 DIAGNOSIS — N186 End stage renal disease: Secondary | ICD-10-CM | POA: Diagnosis not present

## 2017-07-10 DIAGNOSIS — D631 Anemia in chronic kidney disease: Secondary | ICD-10-CM | POA: Diagnosis not present

## 2017-07-10 DIAGNOSIS — N2581 Secondary hyperparathyroidism of renal origin: Secondary | ICD-10-CM | POA: Diagnosis not present

## 2017-07-13 DIAGNOSIS — N186 End stage renal disease: Secondary | ICD-10-CM | POA: Diagnosis not present

## 2017-07-13 DIAGNOSIS — D631 Anemia in chronic kidney disease: Secondary | ICD-10-CM | POA: Diagnosis not present

## 2017-07-13 DIAGNOSIS — D509 Iron deficiency anemia, unspecified: Secondary | ICD-10-CM | POA: Diagnosis not present

## 2017-07-13 DIAGNOSIS — N2581 Secondary hyperparathyroidism of renal origin: Secondary | ICD-10-CM | POA: Diagnosis not present

## 2017-07-15 DIAGNOSIS — D509 Iron deficiency anemia, unspecified: Secondary | ICD-10-CM | POA: Diagnosis not present

## 2017-07-15 DIAGNOSIS — N2581 Secondary hyperparathyroidism of renal origin: Secondary | ICD-10-CM | POA: Diagnosis not present

## 2017-07-15 DIAGNOSIS — D631 Anemia in chronic kidney disease: Secondary | ICD-10-CM | POA: Diagnosis not present

## 2017-07-15 DIAGNOSIS — N186 End stage renal disease: Secondary | ICD-10-CM | POA: Diagnosis not present

## 2017-07-17 DIAGNOSIS — N2581 Secondary hyperparathyroidism of renal origin: Secondary | ICD-10-CM | POA: Diagnosis not present

## 2017-07-17 DIAGNOSIS — N186 End stage renal disease: Secondary | ICD-10-CM | POA: Diagnosis not present

## 2017-07-17 DIAGNOSIS — D631 Anemia in chronic kidney disease: Secondary | ICD-10-CM | POA: Diagnosis not present

## 2017-07-17 DIAGNOSIS — D509 Iron deficiency anemia, unspecified: Secondary | ICD-10-CM | POA: Diagnosis not present

## 2017-07-20 DIAGNOSIS — D509 Iron deficiency anemia, unspecified: Secondary | ICD-10-CM | POA: Diagnosis not present

## 2017-07-20 DIAGNOSIS — N2581 Secondary hyperparathyroidism of renal origin: Secondary | ICD-10-CM | POA: Diagnosis not present

## 2017-07-20 DIAGNOSIS — D631 Anemia in chronic kidney disease: Secondary | ICD-10-CM | POA: Diagnosis not present

## 2017-07-20 DIAGNOSIS — N186 End stage renal disease: Secondary | ICD-10-CM | POA: Diagnosis not present

## 2017-07-22 DIAGNOSIS — D631 Anemia in chronic kidney disease: Secondary | ICD-10-CM | POA: Diagnosis not present

## 2017-07-22 DIAGNOSIS — N2581 Secondary hyperparathyroidism of renal origin: Secondary | ICD-10-CM | POA: Diagnosis not present

## 2017-07-22 DIAGNOSIS — D509 Iron deficiency anemia, unspecified: Secondary | ICD-10-CM | POA: Diagnosis not present

## 2017-07-22 DIAGNOSIS — N186 End stage renal disease: Secondary | ICD-10-CM | POA: Diagnosis not present

## 2017-07-24 DIAGNOSIS — D631 Anemia in chronic kidney disease: Secondary | ICD-10-CM | POA: Diagnosis not present

## 2017-07-24 DIAGNOSIS — N186 End stage renal disease: Secondary | ICD-10-CM | POA: Diagnosis not present

## 2017-07-24 DIAGNOSIS — D509 Iron deficiency anemia, unspecified: Secondary | ICD-10-CM | POA: Diagnosis not present

## 2017-07-24 DIAGNOSIS — N2581 Secondary hyperparathyroidism of renal origin: Secondary | ICD-10-CM | POA: Diagnosis not present

## 2017-07-27 DIAGNOSIS — D631 Anemia in chronic kidney disease: Secondary | ICD-10-CM | POA: Diagnosis not present

## 2017-07-27 DIAGNOSIS — N2581 Secondary hyperparathyroidism of renal origin: Secondary | ICD-10-CM | POA: Diagnosis not present

## 2017-07-27 DIAGNOSIS — N186 End stage renal disease: Secondary | ICD-10-CM | POA: Diagnosis not present

## 2017-07-27 DIAGNOSIS — D509 Iron deficiency anemia, unspecified: Secondary | ICD-10-CM | POA: Diagnosis not present

## 2017-07-29 DIAGNOSIS — N033 Chronic nephritic syndrome with diffuse mesangial proliferative glomerulonephritis: Secondary | ICD-10-CM | POA: Diagnosis not present

## 2017-07-29 DIAGNOSIS — Z992 Dependence on renal dialysis: Secondary | ICD-10-CM | POA: Diagnosis not present

## 2017-07-29 DIAGNOSIS — N186 End stage renal disease: Secondary | ICD-10-CM | POA: Diagnosis not present

## 2017-07-29 DIAGNOSIS — D509 Iron deficiency anemia, unspecified: Secondary | ICD-10-CM | POA: Diagnosis not present

## 2017-07-29 DIAGNOSIS — N2581 Secondary hyperparathyroidism of renal origin: Secondary | ICD-10-CM | POA: Diagnosis not present

## 2017-07-29 DIAGNOSIS — D631 Anemia in chronic kidney disease: Secondary | ICD-10-CM | POA: Diagnosis not present

## 2017-07-31 DIAGNOSIS — D509 Iron deficiency anemia, unspecified: Secondary | ICD-10-CM | POA: Diagnosis not present

## 2017-07-31 DIAGNOSIS — D631 Anemia in chronic kidney disease: Secondary | ICD-10-CM | POA: Diagnosis not present

## 2017-07-31 DIAGNOSIS — N186 End stage renal disease: Secondary | ICD-10-CM | POA: Diagnosis not present

## 2017-07-31 DIAGNOSIS — N2581 Secondary hyperparathyroidism of renal origin: Secondary | ICD-10-CM | POA: Diagnosis not present

## 2017-08-03 DIAGNOSIS — N186 End stage renal disease: Secondary | ICD-10-CM | POA: Diagnosis not present

## 2017-08-03 DIAGNOSIS — D509 Iron deficiency anemia, unspecified: Secondary | ICD-10-CM | POA: Diagnosis not present

## 2017-08-03 DIAGNOSIS — N2581 Secondary hyperparathyroidism of renal origin: Secondary | ICD-10-CM | POA: Diagnosis not present

## 2017-08-03 DIAGNOSIS — D631 Anemia in chronic kidney disease: Secondary | ICD-10-CM | POA: Diagnosis not present

## 2017-08-06 DIAGNOSIS — D631 Anemia in chronic kidney disease: Secondary | ICD-10-CM | POA: Diagnosis not present

## 2017-08-06 DIAGNOSIS — D509 Iron deficiency anemia, unspecified: Secondary | ICD-10-CM | POA: Diagnosis not present

## 2017-08-06 DIAGNOSIS — N186 End stage renal disease: Secondary | ICD-10-CM | POA: Diagnosis not present

## 2017-08-06 DIAGNOSIS — N2581 Secondary hyperparathyroidism of renal origin: Secondary | ICD-10-CM | POA: Diagnosis not present

## 2017-08-07 DIAGNOSIS — D631 Anemia in chronic kidney disease: Secondary | ICD-10-CM | POA: Diagnosis not present

## 2017-08-07 DIAGNOSIS — N2581 Secondary hyperparathyroidism of renal origin: Secondary | ICD-10-CM | POA: Diagnosis not present

## 2017-08-07 DIAGNOSIS — D509 Iron deficiency anemia, unspecified: Secondary | ICD-10-CM | POA: Diagnosis not present

## 2017-08-07 DIAGNOSIS — N186 End stage renal disease: Secondary | ICD-10-CM | POA: Diagnosis not present

## 2017-08-10 ENCOUNTER — Inpatient Hospital Stay: Payer: Medicare Other

## 2017-08-10 DIAGNOSIS — D509 Iron deficiency anemia, unspecified: Secondary | ICD-10-CM | POA: Diagnosis not present

## 2017-08-10 DIAGNOSIS — D631 Anemia in chronic kidney disease: Secondary | ICD-10-CM | POA: Diagnosis not present

## 2017-08-10 DIAGNOSIS — N2581 Secondary hyperparathyroidism of renal origin: Secondary | ICD-10-CM | POA: Diagnosis not present

## 2017-08-10 DIAGNOSIS — N186 End stage renal disease: Secondary | ICD-10-CM | POA: Diagnosis not present

## 2017-08-11 ENCOUNTER — Encounter: Payer: Self-pay | Admitting: *Deleted

## 2017-08-11 ENCOUNTER — Other Ambulatory Visit: Payer: Self-pay | Admitting: *Deleted

## 2017-08-11 ENCOUNTER — Other Ambulatory Visit: Payer: Self-pay

## 2017-08-11 NOTE — Patient Outreach (Signed)
[facility]) Care Management  08/11/2017  Christopher Wang [birth year] [id number]   Referral Date: 08/10/17 Referral Source: [facility] Renal Team Referral Reason: Housing-patient being evicted.    Outreach Attempt: #1 Spoke with patient.  He is able to verify HIPAA.    Social: Patient has lived in an apartment with a friend but friend is being evicted as of Thursday.  He states he was not on the lease therefore he has to move too.  Patient reports he has no plans or offers for housing at this time.  Patient is looking for income-based housing for himself.  Patient states he does work some to supplement his income.    Conditions: ESRD, HTN, and FSGS.  Patient is dialyzed on MWF in the morning.  Patient denies any issues with his medical problems. Patient did express stress related to his current housing situation which has caused him not to focus on his health as much.     Medications:  Patient only takes 2 medications and has no problems affording medication.   Appointments:  Patient saw Dr. Lorrene Reid about 2 months ago.  Patient utilizes the bus for transportation.     Advanced Directives: No advanced directives.   Consent: Patient in agreement to [facility] services to assist with housing.   Plan: RN CM will refer patient to social work for help with income based housing.  RN CM will sign off case at this time.     Jone Baseman, RN, MSN [facility] Care Management Care Management Coordinator Direct Line [phone] Toll Free: [phone]  Fax: [phone]

## 2017-08-11 NOTE — Patient Outreach (Signed)
[facility] Wang) Care Management  08/11/2017  Christopher Wang Aug 16, 1986 [id number]   CSW was able to make initial contact with patient today to perform phone assessment, as well as assess and assist with social work needs and services.  CSW introduced self, explained role and types of services provided through [facility] ([facility]).  CSW further explained to patient that CSW works with patient's Telephonic RNCM, also with [facility], Christopher Wang. CSW then explained the reason for the call, indicating that Mrs. Christopher Wang thought that patient would benefit from social work services and resources to assist with arranging alternate housing.  CSW obtained two HIPAA compliant identifiers from patient, which included patient's name and date of birth. Patient admitted that because of a "search warrant", he and his friend/roommate are both being evicted from their current place of residence and that they must be out by Thursday, August 13, 2017.  Patient went on to say that he and his roommate have both been to court to try and appeal the eviction, but they lost and the eviction still stands.  Patient reported, "The search warrant wasn't even directed at [place], it was for a previous tennant, but we still have to go".  Patient indicated that his friend has offered to let him come and stay with him and his mother, but patient refuses, indicating that he does not wish to live with this person any longer. Patient denies having any friends and/or relatives that he can go and live with, or even stay with for a brief period of time.  Patient reports being on a very fixed income, not able to afford more than $200.00 per month in rent.  CSW explained to patient that living for under $200.00 per month in rent is not realistic, as even group homes and boarding houses charge more for rent per month.  Patient was agreeable to having CSW look into various group homes  and boarding houses for him, depending on the location.  CSW agreed to make some calls then follow back up with patient to report findings. In the meantime, CSW has encouraged patient to get in contact with Emergency Services/Housing through the [facility] to see if there are currently any availabilities.  CSW explained to patient that the [employer] is not currently accepting applications for [employer], [place] and/or [employer].  Patient reported that he has been given a list of resources to call, but that no one has any availability.  CSW then spoke with patient about [employer] being an option for him, but patient was not at all agreeable to this plan.  Patient does not qualify for rest home level care and/or assisted living. Christopher Wang, BSW, MSW, LCSW  Licensed Education officer, environmental Health System  Mailing [place] [address] [place], Christopher Wang [address] Physical Address-[address], [place], [place] [address] Toll Free Main # [phone] Fax # [phone] Cell # [phone]  Office # [phone] [email or web address]

## 2017-08-11 NOTE — Patient Outreach (Signed)
Request received from Joanna Saporito, LCSW to mail patient personal care resources.  Information mailed today. 

## 2017-08-12 ENCOUNTER — Other Ambulatory Visit: Payer: Self-pay | Admitting: *Deleted

## 2017-08-12 DIAGNOSIS — N186 End stage renal disease: Secondary | ICD-10-CM | POA: Diagnosis not present

## 2017-08-12 DIAGNOSIS — D631 Anemia in chronic kidney disease: Secondary | ICD-10-CM | POA: Diagnosis not present

## 2017-08-12 DIAGNOSIS — D509 Iron deficiency anemia, unspecified: Secondary | ICD-10-CM | POA: Diagnosis not present

## 2017-08-12 DIAGNOSIS — N2581 Secondary hyperparathyroidism of renal origin: Secondary | ICD-10-CM | POA: Diagnosis not present

## 2017-08-12 NOTE — Patient Outreach (Signed)
[facility]) Care Management  08/12/2017  Christopher Wang 1986/06/17 [id number]   CSW made an attempt to try and contact patient today to follow-up regarding social work services and resources; however, patient was unavailable at the time of CSW's call.  A HIPAA compliant message was left for patient on voicemail.  CSW is currently awaiting a return call.  CSW will make a second outreach attempt within the next week, if CSW does not receive a return call from patient in the meantime. [name], BSW, MSW, LCSW  Licensed Education officer, environmental Health System  Mailing [place] [address], [place], [place] [address] Physical Address-[address], [place], [place] [address] Toll Free Main # [phone] Fax # [phone] Cell # [phone]  Office # [phone] [email or web address]

## 2017-08-14 ENCOUNTER — Ambulatory Visit: Payer: Self-pay | Admitting: *Deleted

## 2017-08-14 ENCOUNTER — Other Ambulatory Visit: Payer: Self-pay | Admitting: *Deleted

## 2017-08-14 DIAGNOSIS — D509 Iron deficiency anemia, unspecified: Secondary | ICD-10-CM | POA: Diagnosis not present

## 2017-08-14 DIAGNOSIS — D631 Anemia in chronic kidney disease: Secondary | ICD-10-CM | POA: Diagnosis not present

## 2017-08-14 DIAGNOSIS — N2581 Secondary hyperparathyroidism of renal origin: Secondary | ICD-10-CM | POA: Diagnosis not present

## 2017-08-14 DIAGNOSIS — N186 End stage renal disease: Secondary | ICD-10-CM | POA: Diagnosis not present

## 2017-08-14 NOTE — Patient Outreach (Signed)
[facility]) Care Management  08/14/2017  Christopher Wang 06/20/1986 [id number]   CSW was able to make contact with patient today to follow-up regarding alternate placement arrangements, as well as ensure that patient has a place to stay and continue to receive dialysis treatments.  Patient reported that he was late to his dialysis treatment today because he was moving the last few items from his previous place of residence into a storage facility this morning.  Patient admitted being "very angry" with his friend and roommate, having caused them to be evicted from their home because he was "dealing drugs" from the house and got caught.  Patient plans to stay with this friend and his mother for at least a week, until he is able to move in with another friend that currently lives in [place].  Patient is in the process of trying to line up his dialysis treatments in [facility], as he believes that he will be living there until affordable housing comes available for him in the [place] area.  Patient is aware that there are currently no vacancies at any of the group homes and/or boarding houses in the [place] area.  Patient is also aware that emergency housing is not available to him, as he has several alternate housing arrangements already in place.  Patient does not wish to stay with his friend and his friends mother, but realizes that there are really no other options available to him at this time.  The [employer] is not accepting applications, as they are currently on a two-year waiting list for existing applicants.  Patient reports that he is unable to afford to rent an apartment on his own, relying on various friends to provide safe and affordable housing accommodations.  CSW agreed to continue to try and contact various community agencies and resources that may be able to offer assistance.  CSW will follow-up with patient as soon as CSW is able to  report findings.  Otherwise, CSW will follow-up with patient again early next week. [name], BSW, MSW, LCSW  Licensed Education officer, environmental Health System  Mailing [place] [address] [place], [place] [address] Physical Address-[address], [place], [place] [address] Toll Free Main # [phone] Fax # [phone] Cell # [phone]  Office # [phone] [email or web address]

## 2017-08-16 DIAGNOSIS — N2581 Secondary hyperparathyroidism of renal origin: Secondary | ICD-10-CM | POA: Diagnosis not present

## 2017-08-16 DIAGNOSIS — D631 Anemia in chronic kidney disease: Secondary | ICD-10-CM | POA: Diagnosis not present

## 2017-08-16 DIAGNOSIS — N186 End stage renal disease: Secondary | ICD-10-CM | POA: Diagnosis not present

## 2017-08-16 DIAGNOSIS — D509 Iron deficiency anemia, unspecified: Secondary | ICD-10-CM | POA: Diagnosis not present

## 2017-08-18 ENCOUNTER — Encounter: Payer: Self-pay | Admitting: *Deleted

## 2017-08-18 ENCOUNTER — Other Ambulatory Visit: Payer: Self-pay | Admitting: *Deleted

## 2017-08-18 DIAGNOSIS — D631 Anemia in chronic kidney disease: Secondary | ICD-10-CM | POA: Diagnosis not present

## 2017-08-18 DIAGNOSIS — N2581 Secondary hyperparathyroidism of renal origin: Secondary | ICD-10-CM | POA: Diagnosis not present

## 2017-08-18 DIAGNOSIS — D509 Iron deficiency anemia, unspecified: Secondary | ICD-10-CM | POA: Diagnosis not present

## 2017-08-18 DIAGNOSIS — N186 End stage renal disease: Secondary | ICD-10-CM | POA: Diagnosis not present

## 2017-08-18 NOTE — Patient Outreach (Signed)
[facility]) Care Management  08/18/2017  Christopher Wang 12-29-1985 [id number]   CSW was able to make contact with patient today to follow-up regarding alternate housing arrangements for patient.  Patient reported that he decided not to stay with his friend and his friends mom, still being angry with his friend for "putting him in this situation" of having to find somewhere else to live.  Currently, patient is staying with another friend and reports that the friend has already told him that he is allowed to stay for as long as possible, until patient is able to find his own housing.  Patient has decided not to stay with his friend in [facility], not wanting to leave the [employer] area.  Patient admitted, "I have enough friends, I'll always have somewhere to stay". Patient verbalized having an appointment with the [employer] today, as they are currently accepting applications, unlike the [employer].  Patient has also been to the [facility] to place a request for emergency housing.  Patient admitted that he has several people working on housing options for him.  CSW  Explained to patient that CSW has exhausted all resources known to CSW, unable to provide patient with the option of living in a group home or boarding house in the [place].  Patient voiced understanding, agreeing to work on finding housing with the aide of his friends, social workers, and others. CSW will perform a case closure on patient, as all goals of treatment have been met from social work standpoint and no additional social work needs have been identified at this time.  CSW will fax an update to patient's Primary Care Physician, Dr. Jamal Maes to ensure that they are aware of CSW's involvement with patient's plan of care.  CSW will submit a case closure request to Alycia Rossetti, Care Management Assistant with [facility], in the form of an [employer].   [name], BSW, MSW, LCSW  Licensed Education officer, environmental Health System  Mailing [place] [address], [place], [place] [address] Physical Address-[address], [place], [place] [address] Toll Free Main # [phone] Fax # [phone] Cell # [phone]  Office # [phone] [email or web address]

## 2017-08-21 DIAGNOSIS — D509 Iron deficiency anemia, unspecified: Secondary | ICD-10-CM | POA: Diagnosis not present

## 2017-08-21 DIAGNOSIS — D631 Anemia in chronic kidney disease: Secondary | ICD-10-CM | POA: Diagnosis not present

## 2017-08-21 DIAGNOSIS — N2581 Secondary hyperparathyroidism of renal origin: Secondary | ICD-10-CM | POA: Diagnosis not present

## 2017-08-21 DIAGNOSIS — N186 End stage renal disease: Secondary | ICD-10-CM | POA: Diagnosis not present

## 2017-08-24 DIAGNOSIS — N2581 Secondary hyperparathyroidism of renal origin: Secondary | ICD-10-CM | POA: Diagnosis not present

## 2017-08-24 DIAGNOSIS — D631 Anemia in chronic kidney disease: Secondary | ICD-10-CM | POA: Diagnosis not present

## 2017-08-24 DIAGNOSIS — D509 Iron deficiency anemia, unspecified: Secondary | ICD-10-CM | POA: Diagnosis not present

## 2017-08-24 DIAGNOSIS — N186 End stage renal disease: Secondary | ICD-10-CM | POA: Diagnosis not present

## 2017-08-26 DIAGNOSIS — D509 Iron deficiency anemia, unspecified: Secondary | ICD-10-CM | POA: Diagnosis not present

## 2017-08-26 DIAGNOSIS — D631 Anemia in chronic kidney disease: Secondary | ICD-10-CM | POA: Diagnosis not present

## 2017-08-26 DIAGNOSIS — N2581 Secondary hyperparathyroidism of renal origin: Secondary | ICD-10-CM | POA: Diagnosis not present

## 2017-08-26 DIAGNOSIS — N186 End stage renal disease: Secondary | ICD-10-CM | POA: Diagnosis not present

## 2017-08-28 DIAGNOSIS — Z992 Dependence on renal dialysis: Secondary | ICD-10-CM | POA: Diagnosis not present

## 2017-08-28 DIAGNOSIS — N186 End stage renal disease: Secondary | ICD-10-CM | POA: Diagnosis not present

## 2017-08-28 DIAGNOSIS — N2581 Secondary hyperparathyroidism of renal origin: Secondary | ICD-10-CM | POA: Diagnosis not present

## 2017-08-28 DIAGNOSIS — D631 Anemia in chronic kidney disease: Secondary | ICD-10-CM | POA: Diagnosis not present

## 2017-08-28 DIAGNOSIS — N033 Chronic nephritic syndrome with diffuse mesangial proliferative glomerulonephritis: Secondary | ICD-10-CM | POA: Diagnosis not present

## 2017-08-28 DIAGNOSIS — D509 Iron deficiency anemia, unspecified: Secondary | ICD-10-CM | POA: Diagnosis not present

## 2017-08-31 DIAGNOSIS — D631 Anemia in chronic kidney disease: Secondary | ICD-10-CM | POA: Diagnosis not present

## 2017-08-31 DIAGNOSIS — N186 End stage renal disease: Secondary | ICD-10-CM | POA: Diagnosis not present

## 2017-08-31 DIAGNOSIS — N2581 Secondary hyperparathyroidism of renal origin: Secondary | ICD-10-CM | POA: Diagnosis not present

## 2017-08-31 DIAGNOSIS — D509 Iron deficiency anemia, unspecified: Secondary | ICD-10-CM | POA: Diagnosis not present

## 2017-09-02 DIAGNOSIS — N186 End stage renal disease: Secondary | ICD-10-CM | POA: Diagnosis not present

## 2017-09-02 DIAGNOSIS — N2581 Secondary hyperparathyroidism of renal origin: Secondary | ICD-10-CM | POA: Diagnosis not present

## 2017-09-02 DIAGNOSIS — D509 Iron deficiency anemia, unspecified: Secondary | ICD-10-CM | POA: Diagnosis not present

## 2017-09-02 DIAGNOSIS — D631 Anemia in chronic kidney disease: Secondary | ICD-10-CM | POA: Diagnosis not present

## 2017-09-04 DIAGNOSIS — D509 Iron deficiency anemia, unspecified: Secondary | ICD-10-CM | POA: Diagnosis not present

## 2017-09-04 DIAGNOSIS — N186 End stage renal disease: Secondary | ICD-10-CM | POA: Diagnosis not present

## 2017-09-04 DIAGNOSIS — N2581 Secondary hyperparathyroidism of renal origin: Secondary | ICD-10-CM | POA: Diagnosis not present

## 2017-09-04 DIAGNOSIS — D631 Anemia in chronic kidney disease: Secondary | ICD-10-CM | POA: Diagnosis not present

## 2017-09-07 DIAGNOSIS — D631 Anemia in chronic kidney disease: Secondary | ICD-10-CM | POA: Diagnosis not present

## 2017-09-07 DIAGNOSIS — N186 End stage renal disease: Secondary | ICD-10-CM | POA: Diagnosis not present

## 2017-09-07 DIAGNOSIS — D509 Iron deficiency anemia, unspecified: Secondary | ICD-10-CM | POA: Diagnosis not present

## 2017-09-07 DIAGNOSIS — N2581 Secondary hyperparathyroidism of renal origin: Secondary | ICD-10-CM | POA: Diagnosis not present

## 2017-09-09 DIAGNOSIS — N2581 Secondary hyperparathyroidism of renal origin: Secondary | ICD-10-CM | POA: Diagnosis not present

## 2017-09-09 DIAGNOSIS — D631 Anemia in chronic kidney disease: Secondary | ICD-10-CM | POA: Diagnosis not present

## 2017-09-09 DIAGNOSIS — N186 End stage renal disease: Secondary | ICD-10-CM | POA: Diagnosis not present

## 2017-09-09 DIAGNOSIS — D509 Iron deficiency anemia, unspecified: Secondary | ICD-10-CM | POA: Diagnosis not present

## 2017-09-11 DIAGNOSIS — D509 Iron deficiency anemia, unspecified: Secondary | ICD-10-CM | POA: Diagnosis not present

## 2017-09-11 DIAGNOSIS — N186 End stage renal disease: Secondary | ICD-10-CM | POA: Diagnosis not present

## 2017-09-11 DIAGNOSIS — N2581 Secondary hyperparathyroidism of renal origin: Secondary | ICD-10-CM | POA: Diagnosis not present

## 2017-09-11 DIAGNOSIS — D631 Anemia in chronic kidney disease: Secondary | ICD-10-CM | POA: Diagnosis not present

## 2017-09-15 DIAGNOSIS — F411 Generalized anxiety disorder: Secondary | ICD-10-CM | POA: Diagnosis not present

## 2017-09-16 DIAGNOSIS — N2581 Secondary hyperparathyroidism of renal origin: Secondary | ICD-10-CM | POA: Diagnosis not present

## 2017-09-16 DIAGNOSIS — N186 End stage renal disease: Secondary | ICD-10-CM | POA: Diagnosis not present

## 2017-09-17 DIAGNOSIS — N2581 Secondary hyperparathyroidism of renal origin: Secondary | ICD-10-CM | POA: Diagnosis not present

## 2017-09-17 DIAGNOSIS — E8770 Fluid overload, unspecified: Secondary | ICD-10-CM | POA: Diagnosis not present

## 2017-09-17 DIAGNOSIS — N186 End stage renal disease: Secondary | ICD-10-CM | POA: Diagnosis not present

## 2017-09-18 DIAGNOSIS — N2581 Secondary hyperparathyroidism of renal origin: Secondary | ICD-10-CM | POA: Diagnosis not present

## 2017-09-18 DIAGNOSIS — N186 End stage renal disease: Secondary | ICD-10-CM | POA: Diagnosis not present

## 2017-09-20 DIAGNOSIS — N2581 Secondary hyperparathyroidism of renal origin: Secondary | ICD-10-CM | POA: Diagnosis not present

## 2017-09-20 DIAGNOSIS — N186 End stage renal disease: Secondary | ICD-10-CM | POA: Diagnosis not present

## 2017-09-23 DIAGNOSIS — D509 Iron deficiency anemia, unspecified: Secondary | ICD-10-CM | POA: Diagnosis not present

## 2017-09-23 DIAGNOSIS — N186 End stage renal disease: Secondary | ICD-10-CM | POA: Diagnosis not present

## 2017-09-23 DIAGNOSIS — D631 Anemia in chronic kidney disease: Secondary | ICD-10-CM | POA: Diagnosis not present

## 2017-09-23 DIAGNOSIS — N2581 Secondary hyperparathyroidism of renal origin: Secondary | ICD-10-CM | POA: Diagnosis not present

## 2017-09-25 DIAGNOSIS — D509 Iron deficiency anemia, unspecified: Secondary | ICD-10-CM | POA: Diagnosis not present

## 2017-09-25 DIAGNOSIS — N186 End stage renal disease: Secondary | ICD-10-CM | POA: Diagnosis not present

## 2017-09-25 DIAGNOSIS — N2581 Secondary hyperparathyroidism of renal origin: Secondary | ICD-10-CM | POA: Diagnosis not present

## 2017-09-25 DIAGNOSIS — D631 Anemia in chronic kidney disease: Secondary | ICD-10-CM | POA: Diagnosis not present

## 2017-09-27 DIAGNOSIS — N2581 Secondary hyperparathyroidism of renal origin: Secondary | ICD-10-CM | POA: Diagnosis not present

## 2017-09-27 DIAGNOSIS — N186 End stage renal disease: Secondary | ICD-10-CM | POA: Diagnosis not present

## 2017-09-27 DIAGNOSIS — D509 Iron deficiency anemia, unspecified: Secondary | ICD-10-CM | POA: Diagnosis not present

## 2017-09-27 DIAGNOSIS — D631 Anemia in chronic kidney disease: Secondary | ICD-10-CM | POA: Diagnosis not present

## 2017-09-28 DIAGNOSIS — N186 End stage renal disease: Secondary | ICD-10-CM | POA: Diagnosis not present

## 2017-09-28 DIAGNOSIS — N033 Chronic nephritic syndrome with diffuse mesangial proliferative glomerulonephritis: Secondary | ICD-10-CM | POA: Diagnosis not present

## 2017-09-28 DIAGNOSIS — Z992 Dependence on renal dialysis: Secondary | ICD-10-CM | POA: Diagnosis not present

## 2017-09-30 DIAGNOSIS — D631 Anemia in chronic kidney disease: Secondary | ICD-10-CM | POA: Diagnosis not present

## 2017-09-30 DIAGNOSIS — N186 End stage renal disease: Secondary | ICD-10-CM | POA: Diagnosis not present

## 2017-09-30 DIAGNOSIS — N2581 Secondary hyperparathyroidism of renal origin: Secondary | ICD-10-CM | POA: Diagnosis not present

## 2017-09-30 DIAGNOSIS — D509 Iron deficiency anemia, unspecified: Secondary | ICD-10-CM | POA: Diagnosis not present

## 2017-10-02 DIAGNOSIS — N2581 Secondary hyperparathyroidism of renal origin: Secondary | ICD-10-CM | POA: Diagnosis not present

## 2017-10-02 DIAGNOSIS — D509 Iron deficiency anemia, unspecified: Secondary | ICD-10-CM | POA: Diagnosis not present

## 2017-10-02 DIAGNOSIS — N186 End stage renal disease: Secondary | ICD-10-CM | POA: Diagnosis not present

## 2017-10-02 DIAGNOSIS — D631 Anemia in chronic kidney disease: Secondary | ICD-10-CM | POA: Diagnosis not present

## 2017-10-05 DIAGNOSIS — N2581 Secondary hyperparathyroidism of renal origin: Secondary | ICD-10-CM | POA: Diagnosis not present

## 2017-10-05 DIAGNOSIS — D631 Anemia in chronic kidney disease: Secondary | ICD-10-CM | POA: Diagnosis not present

## 2017-10-05 DIAGNOSIS — D509 Iron deficiency anemia, unspecified: Secondary | ICD-10-CM | POA: Diagnosis not present

## 2017-10-05 DIAGNOSIS — N186 End stage renal disease: Secondary | ICD-10-CM | POA: Diagnosis not present

## 2017-10-07 DIAGNOSIS — N2581 Secondary hyperparathyroidism of renal origin: Secondary | ICD-10-CM | POA: Diagnosis not present

## 2017-10-07 DIAGNOSIS — N186 End stage renal disease: Secondary | ICD-10-CM | POA: Diagnosis not present

## 2017-10-07 DIAGNOSIS — D631 Anemia in chronic kidney disease: Secondary | ICD-10-CM | POA: Diagnosis not present

## 2017-10-07 DIAGNOSIS — D509 Iron deficiency anemia, unspecified: Secondary | ICD-10-CM | POA: Diagnosis not present

## 2017-10-09 DIAGNOSIS — N2581 Secondary hyperparathyroidism of renal origin: Secondary | ICD-10-CM | POA: Diagnosis not present

## 2017-10-09 DIAGNOSIS — N186 End stage renal disease: Secondary | ICD-10-CM | POA: Diagnosis not present

## 2017-10-09 DIAGNOSIS — D509 Iron deficiency anemia, unspecified: Secondary | ICD-10-CM | POA: Diagnosis not present

## 2017-10-09 DIAGNOSIS — D631 Anemia in chronic kidney disease: Secondary | ICD-10-CM | POA: Diagnosis not present

## 2017-10-12 DIAGNOSIS — N186 End stage renal disease: Secondary | ICD-10-CM | POA: Diagnosis not present

## 2017-10-12 DIAGNOSIS — D631 Anemia in chronic kidney disease: Secondary | ICD-10-CM | POA: Diagnosis not present

## 2017-10-12 DIAGNOSIS — N2581 Secondary hyperparathyroidism of renal origin: Secondary | ICD-10-CM | POA: Diagnosis not present

## 2017-10-12 DIAGNOSIS — D509 Iron deficiency anemia, unspecified: Secondary | ICD-10-CM | POA: Diagnosis not present

## 2017-10-14 DIAGNOSIS — N186 End stage renal disease: Secondary | ICD-10-CM | POA: Diagnosis not present

## 2017-10-14 DIAGNOSIS — N2581 Secondary hyperparathyroidism of renal origin: Secondary | ICD-10-CM | POA: Diagnosis not present

## 2017-10-14 DIAGNOSIS — D509 Iron deficiency anemia, unspecified: Secondary | ICD-10-CM | POA: Diagnosis not present

## 2017-10-14 DIAGNOSIS — D631 Anemia in chronic kidney disease: Secondary | ICD-10-CM | POA: Diagnosis not present

## 2017-10-16 DIAGNOSIS — N2581 Secondary hyperparathyroidism of renal origin: Secondary | ICD-10-CM | POA: Diagnosis not present

## 2017-10-16 DIAGNOSIS — D631 Anemia in chronic kidney disease: Secondary | ICD-10-CM | POA: Diagnosis not present

## 2017-10-16 DIAGNOSIS — N186 End stage renal disease: Secondary | ICD-10-CM | POA: Diagnosis not present

## 2017-10-16 DIAGNOSIS — D509 Iron deficiency anemia, unspecified: Secondary | ICD-10-CM | POA: Diagnosis not present

## 2017-10-19 DIAGNOSIS — N186 End stage renal disease: Secondary | ICD-10-CM | POA: Diagnosis not present

## 2017-10-19 DIAGNOSIS — D631 Anemia in chronic kidney disease: Secondary | ICD-10-CM | POA: Diagnosis not present

## 2017-10-19 DIAGNOSIS — D509 Iron deficiency anemia, unspecified: Secondary | ICD-10-CM | POA: Diagnosis not present

## 2017-10-19 DIAGNOSIS — N2581 Secondary hyperparathyroidism of renal origin: Secondary | ICD-10-CM | POA: Diagnosis not present

## 2017-10-21 DIAGNOSIS — D631 Anemia in chronic kidney disease: Secondary | ICD-10-CM | POA: Diagnosis not present

## 2017-10-21 DIAGNOSIS — N2581 Secondary hyperparathyroidism of renal origin: Secondary | ICD-10-CM | POA: Diagnosis not present

## 2017-10-21 DIAGNOSIS — N186 End stage renal disease: Secondary | ICD-10-CM | POA: Diagnosis not present

## 2017-10-21 DIAGNOSIS — D509 Iron deficiency anemia, unspecified: Secondary | ICD-10-CM | POA: Diagnosis not present

## 2017-10-23 DIAGNOSIS — N2581 Secondary hyperparathyroidism of renal origin: Secondary | ICD-10-CM | POA: Diagnosis not present

## 2017-10-23 DIAGNOSIS — N186 End stage renal disease: Secondary | ICD-10-CM | POA: Diagnosis not present

## 2017-10-23 DIAGNOSIS — D631 Anemia in chronic kidney disease: Secondary | ICD-10-CM | POA: Diagnosis not present

## 2017-10-23 DIAGNOSIS — D509 Iron deficiency anemia, unspecified: Secondary | ICD-10-CM | POA: Diagnosis not present

## 2017-10-26 DIAGNOSIS — N2581 Secondary hyperparathyroidism of renal origin: Secondary | ICD-10-CM | POA: Diagnosis not present

## 2017-10-26 DIAGNOSIS — D631 Anemia in chronic kidney disease: Secondary | ICD-10-CM | POA: Diagnosis not present

## 2017-10-26 DIAGNOSIS — N186 End stage renal disease: Secondary | ICD-10-CM | POA: Diagnosis not present

## 2017-10-26 DIAGNOSIS — D509 Iron deficiency anemia, unspecified: Secondary | ICD-10-CM | POA: Diagnosis not present

## 2017-10-29 DIAGNOSIS — N186 End stage renal disease: Secondary | ICD-10-CM | POA: Diagnosis not present

## 2017-10-29 DIAGNOSIS — N033 Chronic nephritic syndrome with diffuse mesangial proliferative glomerulonephritis: Secondary | ICD-10-CM | POA: Diagnosis not present

## 2017-10-29 DIAGNOSIS — Z992 Dependence on renal dialysis: Secondary | ICD-10-CM | POA: Diagnosis not present

## 2017-10-29 DIAGNOSIS — D509 Iron deficiency anemia, unspecified: Secondary | ICD-10-CM | POA: Diagnosis not present

## 2017-10-29 DIAGNOSIS — N2581 Secondary hyperparathyroidism of renal origin: Secondary | ICD-10-CM | POA: Diagnosis not present

## 2017-10-29 DIAGNOSIS — D631 Anemia in chronic kidney disease: Secondary | ICD-10-CM | POA: Diagnosis not present

## 2017-10-30 DIAGNOSIS — D631 Anemia in chronic kidney disease: Secondary | ICD-10-CM | POA: Diagnosis not present

## 2017-10-30 DIAGNOSIS — N033 Chronic nephritic syndrome with diffuse mesangial proliferative glomerulonephritis: Secondary | ICD-10-CM | POA: Diagnosis not present

## 2017-10-30 DIAGNOSIS — N2581 Secondary hyperparathyroidism of renal origin: Secondary | ICD-10-CM | POA: Diagnosis not present

## 2017-10-30 DIAGNOSIS — N186 End stage renal disease: Secondary | ICD-10-CM | POA: Diagnosis not present

## 2017-10-30 DIAGNOSIS — Z992 Dependence on renal dialysis: Secondary | ICD-10-CM | POA: Diagnosis not present

## 2017-10-30 DIAGNOSIS — D509 Iron deficiency anemia, unspecified: Secondary | ICD-10-CM | POA: Diagnosis not present

## 2017-11-02 DIAGNOSIS — D631 Anemia in chronic kidney disease: Secondary | ICD-10-CM | POA: Diagnosis not present

## 2017-11-02 DIAGNOSIS — N186 End stage renal disease: Secondary | ICD-10-CM | POA: Diagnosis not present

## 2017-11-02 DIAGNOSIS — D509 Iron deficiency anemia, unspecified: Secondary | ICD-10-CM | POA: Diagnosis not present

## 2017-11-02 DIAGNOSIS — N2581 Secondary hyperparathyroidism of renal origin: Secondary | ICD-10-CM | POA: Diagnosis not present

## 2017-11-04 DIAGNOSIS — D631 Anemia in chronic kidney disease: Secondary | ICD-10-CM | POA: Diagnosis not present

## 2017-11-04 DIAGNOSIS — D509 Iron deficiency anemia, unspecified: Secondary | ICD-10-CM | POA: Diagnosis not present

## 2017-11-04 DIAGNOSIS — N186 End stage renal disease: Secondary | ICD-10-CM | POA: Diagnosis not present

## 2017-11-04 DIAGNOSIS — N2581 Secondary hyperparathyroidism of renal origin: Secondary | ICD-10-CM | POA: Diagnosis not present

## 2017-11-06 ENCOUNTER — Ambulatory Visit (INDEPENDENT_AMBULATORY_CARE_PROVIDER_SITE_OTHER): Payer: Medicare Other | Admitting: Physician Assistant

## 2017-11-06 ENCOUNTER — Other Ambulatory Visit: Payer: Self-pay

## 2017-11-06 ENCOUNTER — Encounter: Payer: Self-pay | Admitting: Physician Assistant

## 2017-11-06 VITALS — BP 100/64 | HR 112 | Temp 98.7°F | Resp 18 | Ht 72.0 in | Wt 150.0 lb

## 2017-11-06 DIAGNOSIS — I10 Essential (primary) hypertension: Secondary | ICD-10-CM | POA: Diagnosis not present

## 2017-11-06 DIAGNOSIS — E785 Hyperlipidemia, unspecified: Secondary | ICD-10-CM

## 2017-11-06 DIAGNOSIS — E213 Hyperparathyroidism, unspecified: Secondary | ICD-10-CM | POA: Diagnosis not present

## 2017-11-06 DIAGNOSIS — D631 Anemia in chronic kidney disease: Secondary | ICD-10-CM

## 2017-11-06 DIAGNOSIS — I5022 Chronic systolic (congestive) heart failure: Secondary | ICD-10-CM | POA: Diagnosis not present

## 2017-11-06 DIAGNOSIS — D509 Iron deficiency anemia, unspecified: Secondary | ICD-10-CM | POA: Diagnosis not present

## 2017-11-06 DIAGNOSIS — N186 End stage renal disease: Secondary | ICD-10-CM

## 2017-11-06 DIAGNOSIS — N2581 Secondary hyperparathyroidism of renal origin: Secondary | ICD-10-CM | POA: Diagnosis not present

## 2017-11-06 DIAGNOSIS — Z992 Dependence on renal dialysis: Secondary | ICD-10-CM

## 2017-11-06 NOTE — Assessment & Plan Note (Signed)
Well controlled 

## 2017-11-06 NOTE — Progress Notes (Signed)
Patient ID: Christopher Wang, male     DOB: 19-Dec-1985, 32 y.o.    MRN: [id number]  PCP: Harrison Mons, PA-C, as of today's visit  Chief Complaint  Patient presents with  . Establish Care    Pt states he is a diaylsis pt and states he needed at PCP.    Subjective:   This patient is new to me and presents for evaluation of hypertension, nephrotic syndrome, ESRD on hemodialysis, chronic systolic CHF.  His care has been managed by nephrology, and he has recently been advised that he needs a PCP.  Sees nephrology on 11/09/2017.  Health Maintenance Due  Topic Date Due  . TETANUS/TDAP  04/05/2007    No specific problems or concerns today.  Review of Systems Not wanting to share anything today.  Prior to Admission medications   Medication Sig Start Date End Date Taking? Authorizing Provider  FOSRENOL 1000 MG PACK  08/06/17  Yes [provider]     No Known Allergies   Patient Active Problem List   Diagnosis Date Noted  . Hyperparathyroidism ([place]) 11/06/2017  . Systolic heart failure, chronic ([place]) 11/06/2017  . Hypotension of hemodialysis 08/03/2013  . Anemia in chronic kidney disease 07/16/2013  . Uremia 07/15/2013  . ESRD on dialysis ([place]) 07/08/2013  . Chronic kidney disease (CKD), stage IV (severe) ([place]) 07/08/2013  . FSGS (focal segmental glomerulosclerosis) with nephrosis 08/15/2011  . Hypertension 08/09/2011  . Hyperlipidemia 08/07/2011  . Nephrotic syndrome 08/06/2011  . Serum albumin decreased 08/06/2011  . Proteinuria 08/06/2011  . Hypokalemia 08/06/2011     Family History  Problem Relation Age of Onset  . Hypertension Mother      Social History   Socioeconomic History  . Marital status: Single    Spouse name: Not on file  . Number of children: 0  . Years of education: Not on file  . Highest education level: High school graduate  Social Needs  . Financial resource strain: Not very hard  . Food insecurity - worry: Sometimes  true  . Food insecurity - inability: Never true  . Transportation needs - medical: No  . Transportation needs - non-medical: No  Occupational History  . Occupation: unemployed    Comment: Passenger transport manager  Tobacco Use  . Smoking status: Passive Smoke Exposure - Never Smoker  . Smokeless tobacco: Never Used  Substance and Sexual Activity  . Alcohol use: Yes    Alcohol/week: 0.6 oz    Types: 1 Shots of liquor per week  . Drug use: No  . Sexual activity: Not Currently    Birth control/protection: None  Other Topics Concern  . Not on file  Social History Narrative   Lives with a friend.   Family lives in [place], [place] and in [place].         Objective:  Physical Exam  Constitutional: He is oriented to person, place, and time. He appears well-developed and well-nourished. He is active and cooperative. No distress.  BP 100/64 (BP Location: Right Arm, Patient Position: Sitting, Cuff Size: Normal)   Pulse (!) 112   Temp 98.7 F (37.1 C) (Oral)   Resp 18   Ht 6' (1.829 m)   Wt 150 lb (68 kg)   SpO2 97%   BMI 20.34 kg/m   HENT:  Head: Normocephalic and atraumatic.  Right Ear: Hearing normal.  Left Ear: Hearing normal.  Eyes: Conjunctivae are normal. No scleral icterus.  Neck: Normal range of motion. Neck supple. No  thyromegaly present.  Cardiovascular: Normal rate, regular rhythm and normal heart sounds.  Pulses:      Radial pulses are 2+ on the right side, and 2+ on the left side.  Dialysis fistula LEFT forearm with easily palpable thrill.  Pulmonary/Chest: Effort normal and breath sounds normal.  Lymphadenopathy:       Head (right side): No tonsillar, no preauricular, no posterior auricular and no occipital adenopathy present.       Head (left side): No tonsillar, no preauricular, no posterior auricular and no occipital adenopathy present.    He has no cervical adenopathy.       Right: No supraclavicular adenopathy present.       Left: No supraclavicular adenopathy present.   Neurological: He is alert and oriented to person, place, and time. No sensory deficit.  Skin: Skin is warm, dry and intact. No rash noted. No cyanosis or erythema. Nails show no clubbing.  Psychiatric: His speech is normal. His mood appears not anxious. His affect is blunt. His affect is not angry, not labile and not inappropriate. He is not agitated, not aggressive, not hyperactive, not slowed, not withdrawn, not actively hallucinating and not combative. He does not exhibit a depressed mood.  Palpation of the RIGHT neck and anterior tibia elicited an "OW!" and withdrawal by the patient, but on both occasions when asked if exam was causing pain, he replied, "No." He is attentive.   Wt Readings from Last 3 Encounters:  11/06/17 150 lb (68 kg)  07/08/16 145 lb (65.8 kg)  12/16/14 171 lb 15.3 oz (78 kg)        Assessment & Plan:   Problem List Items Addressed This Visit    Hyperlipidemia    Last lipid profile about 1 year ago. Hope to coordinate repeat lipid profile with next lab draw by nephrology. If not, will do at next visit here.      Hypertension    Well controlled.      ESRD on dialysis [facility]) - Primary    Continue follow-up and treatment per nephrology.      Anemia in chronic kidney disease    Continue follow-up and treatment per nephrology.      Hyperparathyroidism ([place])    Continue follow-up and treatment per nephrology.      Systolic heart failure, chronic ([place])    Euvolemic on exam today. Continue dialysis per nephrology.        Declines Tdap today.  Return in about 3 months (around 02/03/2018) for re-evaluation and to close care gaps.   Fara Chute, PA-C Primary Care at [place]

## 2017-11-06 NOTE — Assessment & Plan Note (Signed)
Last lipid profile about 1 year ago. Hope to coordinate repeat lipid profile with next lab draw by nephrology. If not, will do at next visit here.

## 2017-11-06 NOTE — Patient Instructions (Addendum)
Please ask Dr. Jimmy Footman to send me a copy of the notes from your visit next week.    IF you received an x-ray today, you will receive an invoice from [facility] Radiology. Please contact [facility] Radiology at [phone] with questions or concerns regarding your invoice.   IF you received labwork today, you will receive an invoice from [facility]. Please contact [facility] at [phone] with questions or concerns regarding your invoice.   Our billing staff will not be able to assist you with questions regarding bills from these companies.  You will be contacted with the lab results as soon as they are available. The fastest way to get your results is to activate your My Chart account. Instructions are located on the last page of this paperwork. If you have not heard from [place] regarding the results in 2 weeks, please contact this office.     Preventive Care 18-39 Years, Male Preventive care refers to lifestyle choices and visits with your health care provider that can promote health and wellness. What does preventive care include?  A yearly physical exam. This is also called an annual well check.  Dental exams once or twice a year.  Routine eye exams. Ask your health care provider how often you should have your eyes checked.  Personal lifestyle choices, including: ? Daily care of your teeth and gums. ? Regular physical activity. ? Eating a healthy diet. ? Avoiding tobacco and drug use. ? Limiting alcohol use. ? Practicing safe sex. What happens during an annual well check? The services and screenings done by your health care provider during your annual well check will depend on your age, overall health, lifestyle risk factors, and family history of disease. Counseling Your health care provider may ask you questions about your:  Alcohol use.  Tobacco use.  Drug use.  Emotional well-being.  Home and relationship well-being.  Sexual activity.  Eating habits.  Work and  work Statistician.  Screening You may have the following tests or measurements:  Height, weight, and BMI.  Blood pressure.  Lipid and cholesterol levels. These may be checked every 5 years starting at age 15.  Diabetes screening. This is done by checking your blood sugar (glucose) after you have not eaten for a while (fasting).  Skin check.  Hepatitis C blood test.  Hepatitis B blood test.  Sexually transmitted disease (STD) testing.  Discuss your test results, treatment options, and if necessary, the need for more tests with your health care provider. Vaccines Your health care provider may recommend certain vaccines, such as:  Influenza vaccine. This is recommended every year.  Tetanus, diphtheria, and acellular pertussis (Tdap, Td) vaccine. You may need a Td booster every 10 years.  Varicella vaccine. You may need this if you have not been vaccinated.  HPV vaccine. If you are 57 or younger, you may need three doses over 6 months.  Measles, mumps, and rubella (MMR) vaccine. You may need at least one dose of MMR.You may also need a second dose.  Pneumococcal 13-valent conjugate (PCV13) vaccine. You may need this if you have certain conditions and have not been vaccinated.  Pneumococcal polysaccharide (PPSV23) vaccine. You may need one or two doses if you smoke cigarettes or if you have certain conditions.  Meningococcal vaccine. One dose is recommended if you are age 53-21 years and a first-year college student living in a residence hall, or if you have one of several medical conditions. You may also need additional booster doses.  Hepatitis A vaccine.  You may need this if you have certain conditions or if you travel or work in places where you may be exposed to hepatitis A.  Hepatitis B vaccine. You may need this if you have certain conditions or if you travel or work in places where you may be exposed to hepatitis B.  Haemophilus influenzae type b (Hib) vaccine. You may  need this if you have certain risk factors.  Talk to your health care provider about which screenings and vaccines you need and how often you need them. This information is not intended to replace advice given to you by your health care provider. Make sure you discuss any questions you have with your health care provider. Document Released: 11/11/2001 Document Revised: 06/04/2016 Document Reviewed: 07/17/2015 Elsevier Interactive Patient Education  [employer].

## 2017-11-06 NOTE — Assessment & Plan Note (Signed)
Continue follow-up and treatment per nephrology.

## 2017-11-06 NOTE — Assessment & Plan Note (Signed)
Euvolemic on exam today. Continue dialysis per nephrology.

## 2017-11-09 DIAGNOSIS — N2581 Secondary hyperparathyroidism of renal origin: Secondary | ICD-10-CM | POA: Diagnosis not present

## 2017-11-09 DIAGNOSIS — D509 Iron deficiency anemia, unspecified: Secondary | ICD-10-CM | POA: Diagnosis not present

## 2017-11-09 DIAGNOSIS — N186 End stage renal disease: Secondary | ICD-10-CM | POA: Diagnosis not present

## 2017-11-09 DIAGNOSIS — D631 Anemia in chronic kidney disease: Secondary | ICD-10-CM | POA: Diagnosis not present

## 2017-11-11 DIAGNOSIS — D631 Anemia in chronic kidney disease: Secondary | ICD-10-CM | POA: Diagnosis not present

## 2017-11-11 DIAGNOSIS — D509 Iron deficiency anemia, unspecified: Secondary | ICD-10-CM | POA: Diagnosis not present

## 2017-11-11 DIAGNOSIS — N2581 Secondary hyperparathyroidism of renal origin: Secondary | ICD-10-CM | POA: Diagnosis not present

## 2017-11-11 DIAGNOSIS — N186 End stage renal disease: Secondary | ICD-10-CM | POA: Diagnosis not present

## 2017-11-12 DIAGNOSIS — N2581 Secondary hyperparathyroidism of renal origin: Secondary | ICD-10-CM | POA: Diagnosis not present

## 2017-11-12 DIAGNOSIS — N186 End stage renal disease: Secondary | ICD-10-CM | POA: Diagnosis not present

## 2017-11-12 DIAGNOSIS — D631 Anemia in chronic kidney disease: Secondary | ICD-10-CM | POA: Diagnosis not present

## 2017-11-12 DIAGNOSIS — D509 Iron deficiency anemia, unspecified: Secondary | ICD-10-CM | POA: Diagnosis not present

## 2017-11-16 DIAGNOSIS — D509 Iron deficiency anemia, unspecified: Secondary | ICD-10-CM | POA: Diagnosis not present

## 2017-11-16 DIAGNOSIS — N2581 Secondary hyperparathyroidism of renal origin: Secondary | ICD-10-CM | POA: Diagnosis not present

## 2017-11-16 DIAGNOSIS — N186 End stage renal disease: Secondary | ICD-10-CM | POA: Diagnosis not present

## 2017-11-16 DIAGNOSIS — D631 Anemia in chronic kidney disease: Secondary | ICD-10-CM | POA: Diagnosis not present

## 2017-11-18 DIAGNOSIS — D631 Anemia in chronic kidney disease: Secondary | ICD-10-CM | POA: Diagnosis not present

## 2017-11-18 DIAGNOSIS — N2581 Secondary hyperparathyroidism of renal origin: Secondary | ICD-10-CM | POA: Diagnosis not present

## 2017-11-18 DIAGNOSIS — D509 Iron deficiency anemia, unspecified: Secondary | ICD-10-CM | POA: Diagnosis not present

## 2017-11-18 DIAGNOSIS — N186 End stage renal disease: Secondary | ICD-10-CM | POA: Diagnosis not present

## 2017-11-20 DIAGNOSIS — D509 Iron deficiency anemia, unspecified: Secondary | ICD-10-CM | POA: Diagnosis not present

## 2017-11-20 DIAGNOSIS — D631 Anemia in chronic kidney disease: Secondary | ICD-10-CM | POA: Diagnosis not present

## 2017-11-20 DIAGNOSIS — N2581 Secondary hyperparathyroidism of renal origin: Secondary | ICD-10-CM | POA: Diagnosis not present

## 2017-11-20 DIAGNOSIS — N186 End stage renal disease: Secondary | ICD-10-CM | POA: Diagnosis not present

## 2017-11-23 DIAGNOSIS — N186 End stage renal disease: Secondary | ICD-10-CM | POA: Diagnosis not present

## 2017-11-23 DIAGNOSIS — D509 Iron deficiency anemia, unspecified: Secondary | ICD-10-CM | POA: Diagnosis not present

## 2017-11-23 DIAGNOSIS — N2581 Secondary hyperparathyroidism of renal origin: Secondary | ICD-10-CM | POA: Diagnosis not present

## 2017-11-23 DIAGNOSIS — D631 Anemia in chronic kidney disease: Secondary | ICD-10-CM | POA: Diagnosis not present

## 2017-11-25 DIAGNOSIS — N186 End stage renal disease: Secondary | ICD-10-CM | POA: Diagnosis not present

## 2017-11-25 DIAGNOSIS — N2581 Secondary hyperparathyroidism of renal origin: Secondary | ICD-10-CM | POA: Diagnosis not present

## 2017-11-25 DIAGNOSIS — D509 Iron deficiency anemia, unspecified: Secondary | ICD-10-CM | POA: Diagnosis not present

## 2017-11-25 DIAGNOSIS — D631 Anemia in chronic kidney disease: Secondary | ICD-10-CM | POA: Diagnosis not present

## 2017-11-27 DIAGNOSIS — D509 Iron deficiency anemia, unspecified: Secondary | ICD-10-CM | POA: Diagnosis not present

## 2017-11-27 DIAGNOSIS — Z992 Dependence on renal dialysis: Secondary | ICD-10-CM | POA: Diagnosis not present

## 2017-11-27 DIAGNOSIS — N2581 Secondary hyperparathyroidism of renal origin: Secondary | ICD-10-CM | POA: Diagnosis not present

## 2017-11-27 DIAGNOSIS — N033 Chronic nephritic syndrome with diffuse mesangial proliferative glomerulonephritis: Secondary | ICD-10-CM | POA: Diagnosis not present

## 2017-11-27 DIAGNOSIS — D631 Anemia in chronic kidney disease: Secondary | ICD-10-CM | POA: Diagnosis not present

## 2017-11-27 DIAGNOSIS — N186 End stage renal disease: Secondary | ICD-10-CM | POA: Diagnosis not present

## 2017-11-30 DIAGNOSIS — N2581 Secondary hyperparathyroidism of renal origin: Secondary | ICD-10-CM | POA: Diagnosis not present

## 2017-11-30 DIAGNOSIS — N186 End stage renal disease: Secondary | ICD-10-CM | POA: Diagnosis not present

## 2017-11-30 DIAGNOSIS — D509 Iron deficiency anemia, unspecified: Secondary | ICD-10-CM | POA: Diagnosis not present

## 2017-11-30 DIAGNOSIS — D631 Anemia in chronic kidney disease: Secondary | ICD-10-CM | POA: Diagnosis not present

## 2017-12-02 DIAGNOSIS — N186 End stage renal disease: Secondary | ICD-10-CM | POA: Diagnosis not present

## 2017-12-02 DIAGNOSIS — D631 Anemia in chronic kidney disease: Secondary | ICD-10-CM | POA: Diagnosis not present

## 2017-12-02 DIAGNOSIS — D509 Iron deficiency anemia, unspecified: Secondary | ICD-10-CM | POA: Diagnosis not present

## 2017-12-02 DIAGNOSIS — N2581 Secondary hyperparathyroidism of renal origin: Secondary | ICD-10-CM | POA: Diagnosis not present

## 2017-12-04 DIAGNOSIS — N2581 Secondary hyperparathyroidism of renal origin: Secondary | ICD-10-CM | POA: Diagnosis not present

## 2017-12-04 DIAGNOSIS — D509 Iron deficiency anemia, unspecified: Secondary | ICD-10-CM | POA: Diagnosis not present

## 2017-12-04 DIAGNOSIS — N186 End stage renal disease: Secondary | ICD-10-CM | POA: Diagnosis not present

## 2017-12-04 DIAGNOSIS — D631 Anemia in chronic kidney disease: Secondary | ICD-10-CM | POA: Diagnosis not present

## 2017-12-07 DIAGNOSIS — D631 Anemia in chronic kidney disease: Secondary | ICD-10-CM | POA: Diagnosis not present

## 2017-12-07 DIAGNOSIS — N186 End stage renal disease: Secondary | ICD-10-CM | POA: Diagnosis not present

## 2017-12-07 DIAGNOSIS — N2581 Secondary hyperparathyroidism of renal origin: Secondary | ICD-10-CM | POA: Diagnosis not present

## 2017-12-07 DIAGNOSIS — D509 Iron deficiency anemia, unspecified: Secondary | ICD-10-CM | POA: Diagnosis not present

## 2017-12-09 DIAGNOSIS — N186 End stage renal disease: Secondary | ICD-10-CM | POA: Diagnosis not present

## 2017-12-09 DIAGNOSIS — D509 Iron deficiency anemia, unspecified: Secondary | ICD-10-CM | POA: Diagnosis not present

## 2017-12-09 DIAGNOSIS — N2581 Secondary hyperparathyroidism of renal origin: Secondary | ICD-10-CM | POA: Diagnosis not present

## 2017-12-09 DIAGNOSIS — D631 Anemia in chronic kidney disease: Secondary | ICD-10-CM | POA: Diagnosis not present

## 2017-12-10 DIAGNOSIS — D509 Iron deficiency anemia, unspecified: Secondary | ICD-10-CM | POA: Diagnosis not present

## 2017-12-10 DIAGNOSIS — N2581 Secondary hyperparathyroidism of renal origin: Secondary | ICD-10-CM | POA: Diagnosis not present

## 2017-12-10 DIAGNOSIS — N186 End stage renal disease: Secondary | ICD-10-CM | POA: Diagnosis not present

## 2017-12-10 DIAGNOSIS — D631 Anemia in chronic kidney disease: Secondary | ICD-10-CM | POA: Diagnosis not present

## 2017-12-14 DIAGNOSIS — N2581 Secondary hyperparathyroidism of renal origin: Secondary | ICD-10-CM | POA: Diagnosis not present

## 2017-12-14 DIAGNOSIS — N186 End stage renal disease: Secondary | ICD-10-CM | POA: Diagnosis not present

## 2017-12-14 DIAGNOSIS — D509 Iron deficiency anemia, unspecified: Secondary | ICD-10-CM | POA: Diagnosis not present

## 2017-12-14 DIAGNOSIS — D631 Anemia in chronic kidney disease: Secondary | ICD-10-CM | POA: Diagnosis not present

## 2017-12-16 DIAGNOSIS — D509 Iron deficiency anemia, unspecified: Secondary | ICD-10-CM | POA: Diagnosis not present

## 2017-12-16 DIAGNOSIS — D631 Anemia in chronic kidney disease: Secondary | ICD-10-CM | POA: Diagnosis not present

## 2017-12-16 DIAGNOSIS — N2581 Secondary hyperparathyroidism of renal origin: Secondary | ICD-10-CM | POA: Diagnosis not present

## 2017-12-16 DIAGNOSIS — N186 End stage renal disease: Secondary | ICD-10-CM | POA: Diagnosis not present

## 2017-12-18 DIAGNOSIS — N2581 Secondary hyperparathyroidism of renal origin: Secondary | ICD-10-CM | POA: Diagnosis not present

## 2017-12-18 DIAGNOSIS — D509 Iron deficiency anemia, unspecified: Secondary | ICD-10-CM | POA: Diagnosis not present

## 2017-12-18 DIAGNOSIS — D631 Anemia in chronic kidney disease: Secondary | ICD-10-CM | POA: Diagnosis not present

## 2017-12-18 DIAGNOSIS — N186 End stage renal disease: Secondary | ICD-10-CM | POA: Diagnosis not present

## 2017-12-21 DIAGNOSIS — D509 Iron deficiency anemia, unspecified: Secondary | ICD-10-CM | POA: Diagnosis not present

## 2017-12-21 DIAGNOSIS — N186 End stage renal disease: Secondary | ICD-10-CM | POA: Diagnosis not present

## 2017-12-21 DIAGNOSIS — D631 Anemia in chronic kidney disease: Secondary | ICD-10-CM | POA: Diagnosis not present

## 2017-12-21 DIAGNOSIS — N2581 Secondary hyperparathyroidism of renal origin: Secondary | ICD-10-CM | POA: Diagnosis not present

## 2017-12-23 DIAGNOSIS — D509 Iron deficiency anemia, unspecified: Secondary | ICD-10-CM | POA: Diagnosis not present

## 2017-12-23 DIAGNOSIS — N2581 Secondary hyperparathyroidism of renal origin: Secondary | ICD-10-CM | POA: Diagnosis not present

## 2017-12-23 DIAGNOSIS — N186 End stage renal disease: Secondary | ICD-10-CM | POA: Diagnosis not present

## 2017-12-23 DIAGNOSIS — D631 Anemia in chronic kidney disease: Secondary | ICD-10-CM | POA: Diagnosis not present

## 2017-12-25 DIAGNOSIS — N2581 Secondary hyperparathyroidism of renal origin: Secondary | ICD-10-CM | POA: Diagnosis not present

## 2017-12-25 DIAGNOSIS — N186 End stage renal disease: Secondary | ICD-10-CM | POA: Diagnosis not present

## 2017-12-25 DIAGNOSIS — D631 Anemia in chronic kidney disease: Secondary | ICD-10-CM | POA: Diagnosis not present

## 2017-12-25 DIAGNOSIS — D509 Iron deficiency anemia, unspecified: Secondary | ICD-10-CM | POA: Diagnosis not present

## 2017-12-28 DIAGNOSIS — Z992 Dependence on renal dialysis: Secondary | ICD-10-CM | POA: Diagnosis not present

## 2017-12-28 DIAGNOSIS — N033 Chronic nephritic syndrome with diffuse mesangial proliferative glomerulonephritis: Secondary | ICD-10-CM | POA: Diagnosis not present

## 2017-12-28 DIAGNOSIS — D509 Iron deficiency anemia, unspecified: Secondary | ICD-10-CM | POA: Diagnosis not present

## 2017-12-28 DIAGNOSIS — N2581 Secondary hyperparathyroidism of renal origin: Secondary | ICD-10-CM | POA: Diagnosis not present

## 2017-12-28 DIAGNOSIS — N186 End stage renal disease: Secondary | ICD-10-CM | POA: Diagnosis not present

## 2017-12-28 DIAGNOSIS — E875 Hyperkalemia: Secondary | ICD-10-CM | POA: Diagnosis not present

## 2017-12-28 DIAGNOSIS — D631 Anemia in chronic kidney disease: Secondary | ICD-10-CM | POA: Diagnosis not present

## 2017-12-30 DIAGNOSIS — N186 End stage renal disease: Secondary | ICD-10-CM | POA: Diagnosis not present

## 2017-12-30 DIAGNOSIS — E875 Hyperkalemia: Secondary | ICD-10-CM | POA: Diagnosis not present

## 2017-12-30 DIAGNOSIS — D631 Anemia in chronic kidney disease: Secondary | ICD-10-CM | POA: Diagnosis not present

## 2017-12-30 DIAGNOSIS — N2581 Secondary hyperparathyroidism of renal origin: Secondary | ICD-10-CM | POA: Diagnosis not present

## 2017-12-30 DIAGNOSIS — D509 Iron deficiency anemia, unspecified: Secondary | ICD-10-CM | POA: Diagnosis not present

## 2017-12-31 ENCOUNTER — Encounter: Payer: Self-pay | Admitting: Physician Assistant

## 2018-01-01 DIAGNOSIS — E875 Hyperkalemia: Secondary | ICD-10-CM | POA: Diagnosis not present

## 2018-01-01 DIAGNOSIS — D631 Anemia in chronic kidney disease: Secondary | ICD-10-CM | POA: Diagnosis not present

## 2018-01-01 DIAGNOSIS — N2581 Secondary hyperparathyroidism of renal origin: Secondary | ICD-10-CM | POA: Diagnosis not present

## 2018-01-01 DIAGNOSIS — N186 End stage renal disease: Secondary | ICD-10-CM | POA: Diagnosis not present

## 2018-01-01 DIAGNOSIS — D509 Iron deficiency anemia, unspecified: Secondary | ICD-10-CM | POA: Diagnosis not present

## 2018-01-02 DIAGNOSIS — N186 End stage renal disease: Secondary | ICD-10-CM | POA: Diagnosis not present

## 2018-01-02 DIAGNOSIS — N2581 Secondary hyperparathyroidism of renal origin: Secondary | ICD-10-CM | POA: Diagnosis not present

## 2018-01-07 DIAGNOSIS — E875 Hyperkalemia: Secondary | ICD-10-CM | POA: Diagnosis not present

## 2018-01-07 DIAGNOSIS — N186 End stage renal disease: Secondary | ICD-10-CM | POA: Diagnosis not present

## 2018-01-07 DIAGNOSIS — D631 Anemia in chronic kidney disease: Secondary | ICD-10-CM | POA: Diagnosis not present

## 2018-01-07 DIAGNOSIS — D509 Iron deficiency anemia, unspecified: Secondary | ICD-10-CM | POA: Diagnosis not present

## 2018-01-07 DIAGNOSIS — N2581 Secondary hyperparathyroidism of renal origin: Secondary | ICD-10-CM | POA: Diagnosis not present

## 2018-01-08 DIAGNOSIS — N2581 Secondary hyperparathyroidism of renal origin: Secondary | ICD-10-CM | POA: Diagnosis not present

## 2018-01-08 DIAGNOSIS — D631 Anemia in chronic kidney disease: Secondary | ICD-10-CM | POA: Diagnosis not present

## 2018-01-08 DIAGNOSIS — D509 Iron deficiency anemia, unspecified: Secondary | ICD-10-CM | POA: Diagnosis not present

## 2018-01-08 DIAGNOSIS — E875 Hyperkalemia: Secondary | ICD-10-CM | POA: Diagnosis not present

## 2018-01-08 DIAGNOSIS — N186 End stage renal disease: Secondary | ICD-10-CM | POA: Diagnosis not present

## 2018-01-11 DIAGNOSIS — N186 End stage renal disease: Secondary | ICD-10-CM | POA: Diagnosis not present

## 2018-01-11 DIAGNOSIS — D509 Iron deficiency anemia, unspecified: Secondary | ICD-10-CM | POA: Diagnosis not present

## 2018-01-11 DIAGNOSIS — E875 Hyperkalemia: Secondary | ICD-10-CM | POA: Diagnosis not present

## 2018-01-11 DIAGNOSIS — N2581 Secondary hyperparathyroidism of renal origin: Secondary | ICD-10-CM | POA: Diagnosis not present

## 2018-01-11 DIAGNOSIS — D631 Anemia in chronic kidney disease: Secondary | ICD-10-CM | POA: Diagnosis not present

## 2018-01-13 DIAGNOSIS — N2581 Secondary hyperparathyroidism of renal origin: Secondary | ICD-10-CM | POA: Diagnosis not present

## 2018-01-13 DIAGNOSIS — N186 End stage renal disease: Secondary | ICD-10-CM | POA: Diagnosis not present

## 2018-01-13 DIAGNOSIS — E875 Hyperkalemia: Secondary | ICD-10-CM | POA: Diagnosis not present

## 2018-01-13 DIAGNOSIS — D631 Anemia in chronic kidney disease: Secondary | ICD-10-CM | POA: Diagnosis not present

## 2018-01-13 DIAGNOSIS — D509 Iron deficiency anemia, unspecified: Secondary | ICD-10-CM | POA: Diagnosis not present

## 2018-01-15 DIAGNOSIS — D509 Iron deficiency anemia, unspecified: Secondary | ICD-10-CM | POA: Diagnosis not present

## 2018-01-15 DIAGNOSIS — N186 End stage renal disease: Secondary | ICD-10-CM | POA: Diagnosis not present

## 2018-01-15 DIAGNOSIS — N2581 Secondary hyperparathyroidism of renal origin: Secondary | ICD-10-CM | POA: Diagnosis not present

## 2018-01-15 DIAGNOSIS — D631 Anemia in chronic kidney disease: Secondary | ICD-10-CM | POA: Diagnosis not present

## 2018-01-15 DIAGNOSIS — E875 Hyperkalemia: Secondary | ICD-10-CM | POA: Diagnosis not present

## 2018-01-17 ENCOUNTER — Encounter (HOSPITAL_COMMUNITY): Payer: Self-pay | Admitting: Emergency Medicine

## 2018-01-17 ENCOUNTER — Encounter (HOSPITAL_COMMUNITY): Payer: Self-pay

## 2018-01-17 ENCOUNTER — Emergency Department (HOSPITAL_COMMUNITY)
Admission: EM | Admit: 2018-01-17 | Discharge: 2018-01-17 | Disposition: A | Discharge status: Home / Self Care | Payer: Medicare Other | Attending: Emergency Medicine | Admitting: Emergency Medicine

## 2018-01-17 ENCOUNTER — Emergency Department (HOSPITAL_COMMUNITY)
Admission: EM | Admit: 2018-01-17 | Discharge: 2018-01-17 | Disposition: A | Discharge status: Home / Self Care | Payer: Medicare Other | Source: Home / Self Care | Attending: Emergency Medicine | Admitting: Emergency Medicine

## 2018-01-17 ENCOUNTER — Emergency Department (HOSPITAL_COMMUNITY): Payer: Medicare Other

## 2018-01-17 DIAGNOSIS — N184 Chronic kidney disease, stage 4 (severe): Secondary | ICD-10-CM | POA: Insufficient documentation

## 2018-01-17 DIAGNOSIS — S20212A Contusion of left front wall of thorax, initial encounter: Secondary | ICD-10-CM | POA: Insufficient documentation

## 2018-01-17 DIAGNOSIS — Y998 Other external cause status: Secondary | ICD-10-CM | POA: Diagnosis not present

## 2018-01-17 DIAGNOSIS — Y9289 Other specified places as the place of occurrence of the external cause: Secondary | ICD-10-CM | POA: Diagnosis not present

## 2018-01-17 DIAGNOSIS — T7411XA Adult physical abuse, confirmed, initial encounter: Secondary | ICD-10-CM | POA: Diagnosis not present

## 2018-01-17 DIAGNOSIS — S0990XA Unspecified injury of head, initial encounter: Secondary | ICD-10-CM | POA: Insufficient documentation

## 2018-01-17 DIAGNOSIS — I5022 Chronic systolic (congestive) heart failure: Secondary | ICD-10-CM | POA: Insufficient documentation

## 2018-01-17 DIAGNOSIS — Z7722 Contact with and (suspected) exposure to environmental tobacco smoke (acute) (chronic): Secondary | ICD-10-CM | POA: Insufficient documentation

## 2018-01-17 DIAGNOSIS — I130 Hypertensive heart and chronic kidney disease with heart failure and stage 1 through stage 4 chronic kidney disease, or unspecified chronic kidney disease: Secondary | ICD-10-CM | POA: Insufficient documentation

## 2018-01-17 DIAGNOSIS — Y939 Activity, unspecified: Secondary | ICD-10-CM

## 2018-01-17 DIAGNOSIS — Y999 Unspecified external cause status: Secondary | ICD-10-CM

## 2018-01-17 DIAGNOSIS — S0083XA Contusion of other part of head, initial encounter: Secondary | ICD-10-CM | POA: Diagnosis not present

## 2018-01-17 DIAGNOSIS — Z5321 Procedure and treatment not carried out due to patient leaving prior to being seen by health care provider: Secondary | ICD-10-CM | POA: Insufficient documentation

## 2018-01-17 DIAGNOSIS — Y929 Unspecified place or not applicable: Secondary | ICD-10-CM

## 2018-01-17 DIAGNOSIS — R0781 Pleurodynia: Secondary | ICD-10-CM | POA: Diagnosis not present

## 2018-01-17 DIAGNOSIS — S299XXA Unspecified injury of thorax, initial encounter: Secondary | ICD-10-CM | POA: Diagnosis not present

## 2018-01-17 MED ORDER — NAPROXEN 250 MG PO TABS
500.0000 mg | ORAL_TABLET | Freq: Once | ORAL | Status: AC
  Administered 2018-01-17: 500 mg via ORAL
  Filled 2018-01-17: qty 2

## 2018-01-17 NOTE — Discharge Instructions (Signed)
It was my pleasure taking care of you today!   Fortunately, your exam and images today are very reassuring.   Ice affected areas for pain relief. Tylenol as needed for pain.   Follow up with your primary care doctor if symptoms persist.   Return to ER for new or worsening symptoms, any additional concerns.

## 2018-01-17 NOTE — ED Notes (Signed)
Patient transported to x-ray. ?
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
## 2018-01-17 NOTE — ED Notes (Signed)
ED Provider at bedside. 
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
## 2018-01-18 DIAGNOSIS — N2581 Secondary hyperparathyroidism of renal origin: Secondary | ICD-10-CM | POA: Diagnosis not present

## 2018-01-18 DIAGNOSIS — E875 Hyperkalemia: Secondary | ICD-10-CM | POA: Diagnosis not present

## 2018-01-18 DIAGNOSIS — D631 Anemia in chronic kidney disease: Secondary | ICD-10-CM | POA: Diagnosis not present

## 2018-01-18 DIAGNOSIS — D509 Iron deficiency anemia, unspecified: Secondary | ICD-10-CM | POA: Diagnosis not present

## 2018-01-18 DIAGNOSIS — N186 End stage renal disease: Secondary | ICD-10-CM | POA: Diagnosis not present

## 2018-01-20 DIAGNOSIS — N2581 Secondary hyperparathyroidism of renal origin: Secondary | ICD-10-CM | POA: Diagnosis not present

## 2018-01-20 DIAGNOSIS — D509 Iron deficiency anemia, unspecified: Secondary | ICD-10-CM | POA: Diagnosis not present

## 2018-01-20 DIAGNOSIS — E875 Hyperkalemia: Secondary | ICD-10-CM | POA: Diagnosis not present

## 2018-01-20 DIAGNOSIS — N186 End stage renal disease: Secondary | ICD-10-CM | POA: Diagnosis not present

## 2018-01-20 DIAGNOSIS — D631 Anemia in chronic kidney disease: Secondary | ICD-10-CM | POA: Diagnosis not present

## 2018-01-22 DIAGNOSIS — D631 Anemia in chronic kidney disease: Secondary | ICD-10-CM | POA: Diagnosis not present

## 2018-01-22 DIAGNOSIS — D509 Iron deficiency anemia, unspecified: Secondary | ICD-10-CM | POA: Diagnosis not present

## 2018-01-22 DIAGNOSIS — N2581 Secondary hyperparathyroidism of renal origin: Secondary | ICD-10-CM | POA: Diagnosis not present

## 2018-01-22 DIAGNOSIS — N186 End stage renal disease: Secondary | ICD-10-CM | POA: Diagnosis not present

## 2018-01-22 DIAGNOSIS — E875 Hyperkalemia: Secondary | ICD-10-CM | POA: Diagnosis not present

## 2018-01-25 DIAGNOSIS — D631 Anemia in chronic kidney disease: Secondary | ICD-10-CM | POA: Diagnosis not present

## 2018-01-25 DIAGNOSIS — D509 Iron deficiency anemia, unspecified: Secondary | ICD-10-CM | POA: Diagnosis not present

## 2018-01-25 DIAGNOSIS — N2581 Secondary hyperparathyroidism of renal origin: Secondary | ICD-10-CM | POA: Diagnosis not present

## 2018-01-25 DIAGNOSIS — N186 End stage renal disease: Secondary | ICD-10-CM | POA: Diagnosis not present

## 2018-01-25 DIAGNOSIS — E875 Hyperkalemia: Secondary | ICD-10-CM | POA: Diagnosis not present

## 2018-01-27 DIAGNOSIS — N2581 Secondary hyperparathyroidism of renal origin: Secondary | ICD-10-CM | POA: Diagnosis not present

## 2018-01-27 DIAGNOSIS — N033 Chronic nephritic syndrome with diffuse mesangial proliferative glomerulonephritis: Secondary | ICD-10-CM | POA: Diagnosis not present

## 2018-01-27 DIAGNOSIS — D509 Iron deficiency anemia, unspecified: Secondary | ICD-10-CM | POA: Diagnosis not present

## 2018-01-27 DIAGNOSIS — N186 End stage renal disease: Secondary | ICD-10-CM | POA: Diagnosis not present

## 2018-01-27 DIAGNOSIS — D631 Anemia in chronic kidney disease: Secondary | ICD-10-CM | POA: Diagnosis not present

## 2018-01-27 DIAGNOSIS — Z992 Dependence on renal dialysis: Secondary | ICD-10-CM | POA: Diagnosis not present

## 2018-01-29 DIAGNOSIS — N2581 Secondary hyperparathyroidism of renal origin: Secondary | ICD-10-CM | POA: Diagnosis not present

## 2018-01-29 DIAGNOSIS — D631 Anemia in chronic kidney disease: Secondary | ICD-10-CM | POA: Diagnosis not present

## 2018-01-29 DIAGNOSIS — N186 End stage renal disease: Secondary | ICD-10-CM | POA: Diagnosis not present

## 2018-01-29 DIAGNOSIS — D509 Iron deficiency anemia, unspecified: Secondary | ICD-10-CM | POA: Diagnosis not present

## 2018-02-01 DIAGNOSIS — D509 Iron deficiency anemia, unspecified: Secondary | ICD-10-CM | POA: Diagnosis not present

## 2018-02-01 DIAGNOSIS — N2581 Secondary hyperparathyroidism of renal origin: Secondary | ICD-10-CM | POA: Diagnosis not present

## 2018-02-01 DIAGNOSIS — N186 End stage renal disease: Secondary | ICD-10-CM | POA: Diagnosis not present

## 2018-02-01 DIAGNOSIS — D631 Anemia in chronic kidney disease: Secondary | ICD-10-CM | POA: Diagnosis not present

## 2018-02-03 DIAGNOSIS — N186 End stage renal disease: Secondary | ICD-10-CM | POA: Diagnosis not present

## 2018-02-03 DIAGNOSIS — N2581 Secondary hyperparathyroidism of renal origin: Secondary | ICD-10-CM | POA: Diagnosis not present

## 2018-02-03 DIAGNOSIS — D509 Iron deficiency anemia, unspecified: Secondary | ICD-10-CM | POA: Diagnosis not present

## 2018-02-03 DIAGNOSIS — D631 Anemia in chronic kidney disease: Secondary | ICD-10-CM | POA: Diagnosis not present

## 2018-02-05 DIAGNOSIS — D509 Iron deficiency anemia, unspecified: Secondary | ICD-10-CM | POA: Diagnosis not present

## 2018-02-05 DIAGNOSIS — N2581 Secondary hyperparathyroidism of renal origin: Secondary | ICD-10-CM | POA: Diagnosis not present

## 2018-02-05 DIAGNOSIS — N186 End stage renal disease: Secondary | ICD-10-CM | POA: Diagnosis not present

## 2018-02-05 DIAGNOSIS — D631 Anemia in chronic kidney disease: Secondary | ICD-10-CM | POA: Diagnosis not present

## 2018-02-08 DIAGNOSIS — D631 Anemia in chronic kidney disease: Secondary | ICD-10-CM | POA: Diagnosis not present

## 2018-02-08 DIAGNOSIS — N186 End stage renal disease: Secondary | ICD-10-CM | POA: Diagnosis not present

## 2018-02-08 DIAGNOSIS — D509 Iron deficiency anemia, unspecified: Secondary | ICD-10-CM | POA: Diagnosis not present

## 2018-02-08 DIAGNOSIS — N2581 Secondary hyperparathyroidism of renal origin: Secondary | ICD-10-CM | POA: Diagnosis not present

## 2018-02-10 DIAGNOSIS — N2581 Secondary hyperparathyroidism of renal origin: Secondary | ICD-10-CM | POA: Diagnosis not present

## 2018-02-10 DIAGNOSIS — D509 Iron deficiency anemia, unspecified: Secondary | ICD-10-CM | POA: Diagnosis not present

## 2018-02-10 DIAGNOSIS — D631 Anemia in chronic kidney disease: Secondary | ICD-10-CM | POA: Diagnosis not present

## 2018-02-10 DIAGNOSIS — N186 End stage renal disease: Secondary | ICD-10-CM | POA: Diagnosis not present

## 2018-02-12 DIAGNOSIS — D631 Anemia in chronic kidney disease: Secondary | ICD-10-CM | POA: Diagnosis not present

## 2018-02-12 DIAGNOSIS — N2581 Secondary hyperparathyroidism of renal origin: Secondary | ICD-10-CM | POA: Diagnosis not present

## 2018-02-12 DIAGNOSIS — N186 End stage renal disease: Secondary | ICD-10-CM | POA: Diagnosis not present

## 2018-02-12 DIAGNOSIS — D509 Iron deficiency anemia, unspecified: Secondary | ICD-10-CM | POA: Diagnosis not present

## 2018-02-15 DIAGNOSIS — N186 End stage renal disease: Secondary | ICD-10-CM | POA: Diagnosis not present

## 2018-02-15 DIAGNOSIS — N2581 Secondary hyperparathyroidism of renal origin: Secondary | ICD-10-CM | POA: Diagnosis not present

## 2018-02-15 DIAGNOSIS — D631 Anemia in chronic kidney disease: Secondary | ICD-10-CM | POA: Diagnosis not present

## 2018-02-15 DIAGNOSIS — D509 Iron deficiency anemia, unspecified: Secondary | ICD-10-CM | POA: Diagnosis not present

## 2018-02-17 DIAGNOSIS — D631 Anemia in chronic kidney disease: Secondary | ICD-10-CM | POA: Diagnosis not present

## 2018-02-17 DIAGNOSIS — D509 Iron deficiency anemia, unspecified: Secondary | ICD-10-CM | POA: Diagnosis not present

## 2018-02-17 DIAGNOSIS — N186 End stage renal disease: Secondary | ICD-10-CM | POA: Diagnosis not present

## 2018-02-17 DIAGNOSIS — N2581 Secondary hyperparathyroidism of renal origin: Secondary | ICD-10-CM | POA: Diagnosis not present

## 2018-02-19 DIAGNOSIS — N186 End stage renal disease: Secondary | ICD-10-CM | POA: Diagnosis not present

## 2018-02-19 DIAGNOSIS — D631 Anemia in chronic kidney disease: Secondary | ICD-10-CM | POA: Diagnosis not present

## 2018-02-19 DIAGNOSIS — D509 Iron deficiency anemia, unspecified: Secondary | ICD-10-CM | POA: Diagnosis not present

## 2018-02-19 DIAGNOSIS — N2581 Secondary hyperparathyroidism of renal origin: Secondary | ICD-10-CM | POA: Diagnosis not present

## 2018-02-22 DIAGNOSIS — D631 Anemia in chronic kidney disease: Secondary | ICD-10-CM | POA: Diagnosis not present

## 2018-02-22 DIAGNOSIS — N2581 Secondary hyperparathyroidism of renal origin: Secondary | ICD-10-CM | POA: Diagnosis not present

## 2018-02-22 DIAGNOSIS — N186 End stage renal disease: Secondary | ICD-10-CM | POA: Diagnosis not present

## 2018-02-22 DIAGNOSIS — D509 Iron deficiency anemia, unspecified: Secondary | ICD-10-CM | POA: Diagnosis not present

## 2018-02-24 DIAGNOSIS — N186 End stage renal disease: Secondary | ICD-10-CM | POA: Diagnosis not present

## 2018-02-24 DIAGNOSIS — D509 Iron deficiency anemia, unspecified: Secondary | ICD-10-CM | POA: Diagnosis not present

## 2018-02-24 DIAGNOSIS — D631 Anemia in chronic kidney disease: Secondary | ICD-10-CM | POA: Diagnosis not present

## 2018-02-24 DIAGNOSIS — N2581 Secondary hyperparathyroidism of renal origin: Secondary | ICD-10-CM | POA: Diagnosis not present

## 2018-02-26 DIAGNOSIS — N2581 Secondary hyperparathyroidism of renal origin: Secondary | ICD-10-CM | POA: Diagnosis not present

## 2018-02-26 DIAGNOSIS — D631 Anemia in chronic kidney disease: Secondary | ICD-10-CM | POA: Diagnosis not present

## 2018-02-26 DIAGNOSIS — D509 Iron deficiency anemia, unspecified: Secondary | ICD-10-CM | POA: Diagnosis not present

## 2018-02-26 DIAGNOSIS — N186 End stage renal disease: Secondary | ICD-10-CM | POA: Diagnosis not present

## 2018-02-27 DIAGNOSIS — Z992 Dependence on renal dialysis: Secondary | ICD-10-CM | POA: Diagnosis not present

## 2018-02-27 DIAGNOSIS — N186 End stage renal disease: Secondary | ICD-10-CM | POA: Diagnosis not present

## 2018-02-27 DIAGNOSIS — N033 Chronic nephritic syndrome with diffuse mesangial proliferative glomerulonephritis: Secondary | ICD-10-CM | POA: Diagnosis not present

## 2018-03-01 DIAGNOSIS — N2581 Secondary hyperparathyroidism of renal origin: Secondary | ICD-10-CM | POA: Diagnosis not present

## 2018-03-01 DIAGNOSIS — N186 End stage renal disease: Secondary | ICD-10-CM | POA: Diagnosis not present

## 2018-03-01 DIAGNOSIS — D631 Anemia in chronic kidney disease: Secondary | ICD-10-CM | POA: Diagnosis not present

## 2018-03-01 DIAGNOSIS — D509 Iron deficiency anemia, unspecified: Secondary | ICD-10-CM | POA: Diagnosis not present

## 2018-03-03 DIAGNOSIS — D509 Iron deficiency anemia, unspecified: Secondary | ICD-10-CM | POA: Diagnosis not present

## 2018-03-03 DIAGNOSIS — N186 End stage renal disease: Secondary | ICD-10-CM | POA: Diagnosis not present

## 2018-03-03 DIAGNOSIS — N2581 Secondary hyperparathyroidism of renal origin: Secondary | ICD-10-CM | POA: Diagnosis not present

## 2018-03-03 DIAGNOSIS — D631 Anemia in chronic kidney disease: Secondary | ICD-10-CM | POA: Diagnosis not present

## 2018-03-05 DIAGNOSIS — D509 Iron deficiency anemia, unspecified: Secondary | ICD-10-CM | POA: Diagnosis not present

## 2018-03-05 DIAGNOSIS — N186 End stage renal disease: Secondary | ICD-10-CM | POA: Diagnosis not present

## 2018-03-05 DIAGNOSIS — D631 Anemia in chronic kidney disease: Secondary | ICD-10-CM | POA: Diagnosis not present

## 2018-03-05 DIAGNOSIS — N2581 Secondary hyperparathyroidism of renal origin: Secondary | ICD-10-CM | POA: Diagnosis not present

## 2018-03-08 DIAGNOSIS — N186 End stage renal disease: Secondary | ICD-10-CM | POA: Diagnosis not present

## 2018-03-08 DIAGNOSIS — N2581 Secondary hyperparathyroidism of renal origin: Secondary | ICD-10-CM | POA: Diagnosis not present

## 2018-03-08 DIAGNOSIS — D509 Iron deficiency anemia, unspecified: Secondary | ICD-10-CM | POA: Diagnosis not present

## 2018-03-08 DIAGNOSIS — D631 Anemia in chronic kidney disease: Secondary | ICD-10-CM | POA: Diagnosis not present

## 2018-03-10 DIAGNOSIS — N186 End stage renal disease: Secondary | ICD-10-CM | POA: Diagnosis not present

## 2018-03-10 DIAGNOSIS — D509 Iron deficiency anemia, unspecified: Secondary | ICD-10-CM | POA: Diagnosis not present

## 2018-03-10 DIAGNOSIS — D631 Anemia in chronic kidney disease: Secondary | ICD-10-CM | POA: Diagnosis not present

## 2018-03-10 DIAGNOSIS — N2581 Secondary hyperparathyroidism of renal origin: Secondary | ICD-10-CM | POA: Diagnosis not present

## 2018-03-12 DIAGNOSIS — D631 Anemia in chronic kidney disease: Secondary | ICD-10-CM | POA: Diagnosis not present

## 2018-03-12 DIAGNOSIS — N186 End stage renal disease: Secondary | ICD-10-CM | POA: Diagnosis not present

## 2018-03-12 DIAGNOSIS — D509 Iron deficiency anemia, unspecified: Secondary | ICD-10-CM | POA: Diagnosis not present

## 2018-03-12 DIAGNOSIS — N2581 Secondary hyperparathyroidism of renal origin: Secondary | ICD-10-CM | POA: Diagnosis not present

## 2018-03-15 DIAGNOSIS — N186 End stage renal disease: Secondary | ICD-10-CM | POA: Diagnosis not present

## 2018-03-15 DIAGNOSIS — D631 Anemia in chronic kidney disease: Secondary | ICD-10-CM | POA: Diagnosis not present

## 2018-03-15 DIAGNOSIS — D509 Iron deficiency anemia, unspecified: Secondary | ICD-10-CM | POA: Diagnosis not present

## 2018-03-15 DIAGNOSIS — N2581 Secondary hyperparathyroidism of renal origin: Secondary | ICD-10-CM | POA: Diagnosis not present

## 2018-03-17 DIAGNOSIS — N2581 Secondary hyperparathyroidism of renal origin: Secondary | ICD-10-CM | POA: Diagnosis not present

## 2018-03-17 DIAGNOSIS — N186 End stage renal disease: Secondary | ICD-10-CM | POA: Diagnosis not present

## 2018-03-17 DIAGNOSIS — D631 Anemia in chronic kidney disease: Secondary | ICD-10-CM | POA: Diagnosis not present

## 2018-03-17 DIAGNOSIS — D509 Iron deficiency anemia, unspecified: Secondary | ICD-10-CM | POA: Diagnosis not present

## 2018-03-18 ENCOUNTER — Other Ambulatory Visit: Payer: Self-pay

## 2018-03-18 ENCOUNTER — Encounter (HOSPITAL_COMMUNITY): Payer: Self-pay

## 2018-03-18 ENCOUNTER — Emergency Department (HOSPITAL_COMMUNITY)
Admission: EM | Admit: 2018-03-18 | Discharge: 2018-03-18 | Disposition: A | Discharge status: Home / Self Care | Payer: Medicare Other | Attending: Emergency Medicine | Admitting: Emergency Medicine

## 2018-03-18 DIAGNOSIS — E213 Hyperparathyroidism, unspecified: Secondary | ICD-10-CM | POA: Insufficient documentation

## 2018-03-18 DIAGNOSIS — R51 Headache: Secondary | ICD-10-CM | POA: Diagnosis not present

## 2018-03-18 DIAGNOSIS — I130 Hypertensive heart and chronic kidney disease with heart failure and stage 1 through stage 4 chronic kidney disease, or unspecified chronic kidney disease: Secondary | ICD-10-CM | POA: Insufficient documentation

## 2018-03-18 DIAGNOSIS — T07XXXA Unspecified multiple injuries, initial encounter: Secondary | ICD-10-CM

## 2018-03-18 DIAGNOSIS — Z992 Dependence on renal dialysis: Secondary | ICD-10-CM | POA: Insufficient documentation

## 2018-03-18 DIAGNOSIS — S0003XA Contusion of scalp, initial encounter: Secondary | ICD-10-CM | POA: Diagnosis not present

## 2018-03-18 DIAGNOSIS — N184 Chronic kidney disease, stage 4 (severe): Secondary | ICD-10-CM | POA: Insufficient documentation

## 2018-03-18 DIAGNOSIS — I5022 Chronic systolic (congestive) heart failure: Secondary | ICD-10-CM | POA: Diagnosis not present

## 2018-03-18 DIAGNOSIS — Z7722 Contact with and (suspected) exposure to environmental tobacco smoke (acute) (chronic): Secondary | ICD-10-CM | POA: Diagnosis not present

## 2018-03-18 DIAGNOSIS — S0083XA Contusion of other part of head, initial encounter: Secondary | ICD-10-CM | POA: Diagnosis not present

## 2018-03-18 DIAGNOSIS — S20229A Contusion of unspecified back wall of thorax, initial encounter: Secondary | ICD-10-CM | POA: Diagnosis not present
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
## 2018-03-19 DIAGNOSIS — D631 Anemia in chronic kidney disease: Secondary | ICD-10-CM | POA: Diagnosis not present

## 2018-03-19 DIAGNOSIS — N2581 Secondary hyperparathyroidism of renal origin: Secondary | ICD-10-CM | POA: Diagnosis not present

## 2018-03-19 DIAGNOSIS — N186 End stage renal disease: Secondary | ICD-10-CM | POA: Diagnosis not present

## 2018-03-19 DIAGNOSIS — D509 Iron deficiency anemia, unspecified: Secondary | ICD-10-CM | POA: Diagnosis not present

## 2018-03-22 DIAGNOSIS — D631 Anemia in chronic kidney disease: Secondary | ICD-10-CM | POA: Diagnosis not present

## 2018-03-22 DIAGNOSIS — N186 End stage renal disease: Secondary | ICD-10-CM | POA: Diagnosis not present

## 2018-03-22 DIAGNOSIS — N2581 Secondary hyperparathyroidism of renal origin: Secondary | ICD-10-CM | POA: Diagnosis not present

## 2018-03-22 DIAGNOSIS — D509 Iron deficiency anemia, unspecified: Secondary | ICD-10-CM | POA: Diagnosis not present

## 2018-03-24 DIAGNOSIS — D509 Iron deficiency anemia, unspecified: Secondary | ICD-10-CM | POA: Diagnosis not present

## 2018-03-24 DIAGNOSIS — D631 Anemia in chronic kidney disease: Secondary | ICD-10-CM | POA: Diagnosis not present

## 2018-03-24 DIAGNOSIS — N2581 Secondary hyperparathyroidism of renal origin: Secondary | ICD-10-CM | POA: Diagnosis not present

## 2018-03-24 DIAGNOSIS — N186 End stage renal disease: Secondary | ICD-10-CM | POA: Diagnosis not present

## 2018-03-26 DIAGNOSIS — D631 Anemia in chronic kidney disease: Secondary | ICD-10-CM | POA: Diagnosis not present

## 2018-03-26 DIAGNOSIS — D509 Iron deficiency anemia, unspecified: Secondary | ICD-10-CM | POA: Diagnosis not present

## 2018-03-26 DIAGNOSIS — N2581 Secondary hyperparathyroidism of renal origin: Secondary | ICD-10-CM | POA: Diagnosis not present

## 2018-03-26 DIAGNOSIS — N186 End stage renal disease: Secondary | ICD-10-CM | POA: Diagnosis not present

## 2018-03-29 DIAGNOSIS — Z992 Dependence on renal dialysis: Secondary | ICD-10-CM | POA: Diagnosis not present

## 2018-03-29 DIAGNOSIS — N2581 Secondary hyperparathyroidism of renal origin: Secondary | ICD-10-CM | POA: Diagnosis not present

## 2018-03-29 DIAGNOSIS — N186 End stage renal disease: Secondary | ICD-10-CM | POA: Diagnosis not present

## 2018-03-29 DIAGNOSIS — D631 Anemia in chronic kidney disease: Secondary | ICD-10-CM | POA: Diagnosis not present

## 2018-03-29 DIAGNOSIS — N033 Chronic nephritic syndrome with diffuse mesangial proliferative glomerulonephritis: Secondary | ICD-10-CM | POA: Diagnosis not present

## 2018-03-29 DIAGNOSIS — D509 Iron deficiency anemia, unspecified: Secondary | ICD-10-CM | POA: Diagnosis not present

## 2018-03-31 DIAGNOSIS — D631 Anemia in chronic kidney disease: Secondary | ICD-10-CM | POA: Diagnosis not present

## 2018-03-31 DIAGNOSIS — D509 Iron deficiency anemia, unspecified: Secondary | ICD-10-CM | POA: Diagnosis not present

## 2018-03-31 DIAGNOSIS — N186 End stage renal disease: Secondary | ICD-10-CM | POA: Diagnosis not present

## 2018-03-31 DIAGNOSIS — N2581 Secondary hyperparathyroidism of renal origin: Secondary | ICD-10-CM | POA: Diagnosis not present

## 2018-04-02 DIAGNOSIS — D509 Iron deficiency anemia, unspecified: Secondary | ICD-10-CM | POA: Diagnosis not present

## 2018-04-02 DIAGNOSIS — N2581 Secondary hyperparathyroidism of renal origin: Secondary | ICD-10-CM | POA: Diagnosis not present

## 2018-04-02 DIAGNOSIS — D631 Anemia in chronic kidney disease: Secondary | ICD-10-CM | POA: Diagnosis not present

## 2018-04-02 DIAGNOSIS — N186 End stage renal disease: Secondary | ICD-10-CM | POA: Diagnosis not present

## 2018-04-05 DIAGNOSIS — N2581 Secondary hyperparathyroidism of renal origin: Secondary | ICD-10-CM | POA: Diagnosis not present

## 2018-04-05 DIAGNOSIS — D631 Anemia in chronic kidney disease: Secondary | ICD-10-CM | POA: Diagnosis not present

## 2018-04-05 DIAGNOSIS — N186 End stage renal disease: Secondary | ICD-10-CM | POA: Diagnosis not present

## 2018-04-05 DIAGNOSIS — D509 Iron deficiency anemia, unspecified: Secondary | ICD-10-CM | POA: Diagnosis not present

## 2018-04-07 DIAGNOSIS — D631 Anemia in chronic kidney disease: Secondary | ICD-10-CM | POA: Diagnosis not present

## 2018-04-07 DIAGNOSIS — N186 End stage renal disease: Secondary | ICD-10-CM | POA: Diagnosis not present

## 2018-04-07 DIAGNOSIS — D509 Iron deficiency anemia, unspecified: Secondary | ICD-10-CM | POA: Diagnosis not present

## 2018-04-07 DIAGNOSIS — N2581 Secondary hyperparathyroidism of renal origin: Secondary | ICD-10-CM | POA: Diagnosis not present

## 2018-04-09 DIAGNOSIS — N186 End stage renal disease: Secondary | ICD-10-CM | POA: Diagnosis not present

## 2018-04-09 DIAGNOSIS — N2581 Secondary hyperparathyroidism of renal origin: Secondary | ICD-10-CM | POA: Diagnosis not present

## 2018-04-09 DIAGNOSIS — D631 Anemia in chronic kidney disease: Secondary | ICD-10-CM | POA: Diagnosis not present

## 2018-04-09 DIAGNOSIS — D509 Iron deficiency anemia, unspecified: Secondary | ICD-10-CM | POA: Diagnosis not present

## 2018-04-12 DIAGNOSIS — D509 Iron deficiency anemia, unspecified: Secondary | ICD-10-CM | POA: Diagnosis not present

## 2018-04-12 DIAGNOSIS — D631 Anemia in chronic kidney disease: Secondary | ICD-10-CM | POA: Diagnosis not present

## 2018-04-12 DIAGNOSIS — N186 End stage renal disease: Secondary | ICD-10-CM | POA: Diagnosis not present

## 2018-04-12 DIAGNOSIS — N2581 Secondary hyperparathyroidism of renal origin: Secondary | ICD-10-CM | POA: Diagnosis not present

## 2018-04-14 DIAGNOSIS — N186 End stage renal disease: Secondary | ICD-10-CM | POA: Diagnosis not present

## 2018-04-14 DIAGNOSIS — N2581 Secondary hyperparathyroidism of renal origin: Secondary | ICD-10-CM | POA: Diagnosis not present

## 2018-04-14 DIAGNOSIS — D631 Anemia in chronic kidney disease: Secondary | ICD-10-CM | POA: Diagnosis not present

## 2018-04-14 DIAGNOSIS — D509 Iron deficiency anemia, unspecified: Secondary | ICD-10-CM | POA: Diagnosis not present

## 2018-04-16 DIAGNOSIS — D509 Iron deficiency anemia, unspecified: Secondary | ICD-10-CM | POA: Diagnosis not present

## 2018-04-16 DIAGNOSIS — D631 Anemia in chronic kidney disease: Secondary | ICD-10-CM | POA: Diagnosis not present

## 2018-04-16 DIAGNOSIS — N186 End stage renal disease: Secondary | ICD-10-CM | POA: Diagnosis not present

## 2018-04-16 DIAGNOSIS — N2581 Secondary hyperparathyroidism of renal origin: Secondary | ICD-10-CM | POA: Diagnosis not present

## 2018-04-19 DIAGNOSIS — N186 End stage renal disease: Secondary | ICD-10-CM | POA: Diagnosis not present

## 2018-04-19 DIAGNOSIS — D509 Iron deficiency anemia, unspecified: Secondary | ICD-10-CM | POA: Diagnosis not present

## 2018-04-19 DIAGNOSIS — N2581 Secondary hyperparathyroidism of renal origin: Secondary | ICD-10-CM | POA: Diagnosis not present

## 2018-04-19 DIAGNOSIS — D631 Anemia in chronic kidney disease: Secondary | ICD-10-CM | POA: Diagnosis not present

## 2018-04-21 DIAGNOSIS — D631 Anemia in chronic kidney disease: Secondary | ICD-10-CM | POA: Diagnosis not present

## 2018-04-21 DIAGNOSIS — N186 End stage renal disease: Secondary | ICD-10-CM | POA: Diagnosis not present

## 2018-04-21 DIAGNOSIS — D509 Iron deficiency anemia, unspecified: Secondary | ICD-10-CM | POA: Diagnosis not present

## 2018-04-21 DIAGNOSIS — N2581 Secondary hyperparathyroidism of renal origin: Secondary | ICD-10-CM | POA: Diagnosis not present

## 2018-04-23 DIAGNOSIS — D509 Iron deficiency anemia, unspecified: Secondary | ICD-10-CM | POA: Diagnosis not present

## 2018-04-23 DIAGNOSIS — N2581 Secondary hyperparathyroidism of renal origin: Secondary | ICD-10-CM | POA: Diagnosis not present

## 2018-04-23 DIAGNOSIS — N186 End stage renal disease: Secondary | ICD-10-CM | POA: Diagnosis not present

## 2018-04-23 DIAGNOSIS — D631 Anemia in chronic kidney disease: Secondary | ICD-10-CM | POA: Diagnosis not present

## 2018-04-26 DIAGNOSIS — D509 Iron deficiency anemia, unspecified: Secondary | ICD-10-CM | POA: Diagnosis not present

## 2018-04-26 DIAGNOSIS — D631 Anemia in chronic kidney disease: Secondary | ICD-10-CM | POA: Diagnosis not present

## 2018-04-26 DIAGNOSIS — N2581 Secondary hyperparathyroidism of renal origin: Secondary | ICD-10-CM | POA: Diagnosis not present

## 2018-04-26 DIAGNOSIS — N186 End stage renal disease: Secondary | ICD-10-CM | POA: Diagnosis not present

## 2018-04-28 DIAGNOSIS — D509 Iron deficiency anemia, unspecified: Secondary | ICD-10-CM | POA: Diagnosis not present

## 2018-04-28 DIAGNOSIS — D631 Anemia in chronic kidney disease: Secondary | ICD-10-CM | POA: Diagnosis not present

## 2018-04-28 DIAGNOSIS — N2581 Secondary hyperparathyroidism of renal origin: Secondary | ICD-10-CM | POA: Diagnosis not present

## 2018-04-28 DIAGNOSIS — N186 End stage renal disease: Secondary | ICD-10-CM | POA: Diagnosis not present

## 2018-04-29 DIAGNOSIS — Z992 Dependence on renal dialysis: Secondary | ICD-10-CM | POA: Diagnosis not present

## 2018-04-29 DIAGNOSIS — N186 End stage renal disease: Secondary | ICD-10-CM | POA: Diagnosis not present

## 2018-04-29 DIAGNOSIS — N033 Chronic nephritic syndrome with diffuse mesangial proliferative glomerulonephritis: Secondary | ICD-10-CM | POA: Diagnosis not present

## 2018-04-30 DIAGNOSIS — D509 Iron deficiency anemia, unspecified: Secondary | ICD-10-CM | POA: Diagnosis not present

## 2018-04-30 DIAGNOSIS — N186 End stage renal disease: Secondary | ICD-10-CM | POA: Diagnosis not present

## 2018-04-30 DIAGNOSIS — D631 Anemia in chronic kidney disease: Secondary | ICD-10-CM | POA: Diagnosis not present

## 2018-04-30 DIAGNOSIS — N2581 Secondary hyperparathyroidism of renal origin: Secondary | ICD-10-CM | POA: Diagnosis not present

## 2018-05-03 DIAGNOSIS — N186 End stage renal disease: Secondary | ICD-10-CM | POA: Diagnosis not present

## 2018-05-03 DIAGNOSIS — D509 Iron deficiency anemia, unspecified: Secondary | ICD-10-CM | POA: Diagnosis not present

## 2018-05-03 DIAGNOSIS — D631 Anemia in chronic kidney disease: Secondary | ICD-10-CM | POA: Diagnosis not present

## 2018-05-03 DIAGNOSIS — N2581 Secondary hyperparathyroidism of renal origin: Secondary | ICD-10-CM | POA: Diagnosis not present

## 2018-05-05 ENCOUNTER — Ambulatory Visit (INDEPENDENT_AMBULATORY_CARE_PROVIDER_SITE_OTHER): Payer: Medicare Other | Admitting: Vascular Surgery

## 2018-05-05 ENCOUNTER — Encounter: Payer: Self-pay | Admitting: Vascular Surgery

## 2018-05-05 ENCOUNTER — Other Ambulatory Visit: Payer: Self-pay

## 2018-05-05 ENCOUNTER — Encounter: Payer: Self-pay | Admitting: *Deleted

## 2018-05-05 ENCOUNTER — Other Ambulatory Visit: Payer: Self-pay | Admitting: *Deleted

## 2018-05-05 VITALS — BP 113/78 | HR 98 | Temp 97.4°F | Resp 16 | Ht 72.0 in | Wt 148.0 lb

## 2018-05-05 DIAGNOSIS — D631 Anemia in chronic kidney disease: Secondary | ICD-10-CM | POA: Diagnosis not present

## 2018-05-05 DIAGNOSIS — N186 End stage renal disease: Secondary | ICD-10-CM | POA: Diagnosis not present

## 2018-05-05 DIAGNOSIS — D509 Iron deficiency anemia, unspecified: Secondary | ICD-10-CM | POA: Diagnosis not present

## 2018-05-05 DIAGNOSIS — Z992 Dependence on renal dialysis: Secondary | ICD-10-CM

## 2018-05-05 DIAGNOSIS — N2581 Secondary hyperparathyroidism of renal origin: Secondary | ICD-10-CM | POA: Diagnosis not present

## 2018-05-05 NOTE — Progress Notes (Signed)
Established Dialysis Access   History of Present Illness   Christopher Wang is a 32 y.o. 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  The patient is right hand dominant.  Previous access procedures have been completed in the left arm.  The patient had a L RC AVF placed in 07/18/13.  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  The patient denies any steal or bleeding complications.  Past Medical History:  Diagnosis Date  . Acute on chronic renal insufficiency 07/15/2013  . Anemia   . Edema of lower extremity 08/06/2011  . Enteritis 07/15/2013  . Hypertension 08/09/2011  . Nephrotic syndrome   . Scrotal edema 08/06/2011  . Syncope 08/03/2013    Past Surgical History:  Procedure Laterality Date  . AV FISTULA PLACEMENT Left 07/18/2013   Procedure: ARTERIOVENOUS (AV) FISTULA CREATION ;  Surgeon: Rosetta Posner, MD;  Location: [facility];  Service: Vascular;  Laterality: Left;  . INSERTION OF DIALYSIS CATHETER Right 07/18/2013   Procedure: INSERTION OF DIALYSIS CATHETER;  Surgeon: Rosetta Posner, MD;  Location: [facility];  Service: Vascular;  Laterality: Right;  . RENAL BIOPSY      Social History   Socioeconomic History  . Marital status: Single    Spouse name: Not on file  . Number of children: 0  . Years of education: Not on file  . Highest education level: High school graduate  Occupational History  . Occupation: unemployed    Comment: Passenger transport manager  Social Needs  . Financial resource strain: Not very hard  . Food insecurity:    Worry: Sometimes true    Inability: Never true  . Transportation needs:    Medical: No    Non-medical: No  Tobacco Use  . Smoking status: Passive Smoke Exposure - Never Smoker  . Smokeless tobacco: Never Used  Substance and Sexual Activity  . Alcohol use: Yes    Alcohol/week: 0.6 oz    Types: 1 Shots of liquor per week  . Drug use: No  . Sexual activity: Not  Currently    Birth control/protection: None  Lifestyle  . Physical activity:    Days per week: Not on file    Minutes per session: Not on file  . Stress: Not on file  Relationships  . Social connections:    Talks on phone: Not on file    Gets together: Not on file    Attends religious service: Not on file    Active member of club or organization: Not on file    Attends meetings of clubs or organizations: Not on file    Relationship status: Not on file  . Intimate partner violence:    Fear of current or ex partner: Not on file    Emotionally abused: Not on file    Physically abused: Not on file    Forced sexual activity: Not on file  Other Topics Concern  . Not on file  Social History Narrative   Lives with a friend.   Family lives in [place], [place] and in [place].    Family History  Problem Relation Age of Onset  . Hypertension Mother     Current Outpatient Medications  Medication Sig Dispense Refill  . B Complex-C-Folic Acid (RENA-VITE RX) 1 MG TABS Take 1 tablet by mouth daily.  10  . FOSRENOL 1000 MG PACK      No current facility-administered medications for this visit.      No  Known Allergies  REVIEW OF SYSTEMS (negative unless checked):   Cardiac:  []  Chest pain or chest pressure? []  Shortness of breath upon activity? []  Shortness of breath when lying flat? []  Irregular heart rhythm?  Vascular:  []  Pain in calf, thigh, or hip brought on by walking? []  Pain in feet at night that wakes you up from your sleep? []  Blood clot in your veins? []  Leg swelling?  Pulmonary:  []  Oxygen at home? []  Productive cough? []  Wheezing?  Neurologic:  []  Sudden weakness in arms or legs? []  Sudden numbness in arms or legs? []  Sudden onset of difficult speaking or slurred speech? []  Temporary loss of vision in one eye? []  Problems with dizziness?  Gastrointestinal:  []  Blood in stool? []  Vomited blood?  Genitourinary:  []  Burning when urinating? []  Blood in  urine? [x]   End stage renal disease-HD: M/W/F  Psychiatric:  []  Major depression  Hematologic:  []  Bleeding problems? []  Problems with blood clotting?  Dermatologic:  []  Rashes or ulcers?  Constitutional:  []  Fever or chills?  Ear/Nose/Throat:  []  Change in hearing? []  Nose bleeds? []  Sore throat?  Musculoskeletal:  []  Back pain? []  Joint pain? []  Muscle pain?   Physical Examination   Vitals:   05/05/18 1301  BP: 113/78  Pulse: 98  Resp: 16  Temp: (!) 97.4 F (36.3 C)  TempSrc: Oral  SpO2: 99%  Weight: 148 lb (67.1 kg)  Height: 6' (1.829 m)   Body mass index is 20.07 kg/m.  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 male who presents with ESRD requiring hemodialysis, pseudoaneurysmal degeneration L RC AVF  I offered the patient plication of Left radiocephalic arteriovenous fistula.    The patient is aware that the risks of access surgery include but are not limited to: bleeding, infection, steal syndrome, nerve damage, ischemic monomelic neuropathy, failure of access to mature, thrombosis, and possible need for additional access procedures in the future.  The patient has agreed to proceed with the above procedure which will be scheduled Tuesday 13th August with Dr. Scot Dock.Adele Barthel, MD, FACS [facility] Office: [phone] Pager: [phone]

## 2018-05-07 DIAGNOSIS — N2581 Secondary hyperparathyroidism of renal origin: Secondary | ICD-10-CM | POA: Diagnosis not present

## 2018-05-07 DIAGNOSIS — D509 Iron deficiency anemia, unspecified: Secondary | ICD-10-CM | POA: Diagnosis not present

## 2018-05-07 DIAGNOSIS — N186 End stage renal disease: Secondary | ICD-10-CM | POA: Diagnosis not present

## 2018-05-07 DIAGNOSIS — D631 Anemia in chronic kidney disease: Secondary | ICD-10-CM | POA: Diagnosis not present

## 2018-05-11 ENCOUNTER — Encounter (HOSPITAL_COMMUNITY): Admission: RE | Payer: Self-pay | Source: Ambulatory Visit

## 2018-05-11 ENCOUNTER — Ambulatory Visit (HOSPITAL_COMMUNITY): Admission: RE | Admit: 2018-05-11 | Payer: Medicare Other | Source: Ambulatory Visit | Admitting: Vascular Surgery

## 2018-05-11 SURGERY — FISTULA SUPERFICIALIZATION
Anesthesia: Monitor Anesthesia Care | Laterality: Left

## 2018-05-12 DIAGNOSIS — D631 Anemia in chronic kidney disease: Secondary | ICD-10-CM | POA: Diagnosis not present

## 2018-05-12 DIAGNOSIS — N186 End stage renal disease: Secondary | ICD-10-CM | POA: Diagnosis not present

## 2018-05-12 DIAGNOSIS — D509 Iron deficiency anemia, unspecified: Secondary | ICD-10-CM | POA: Diagnosis not present

## 2018-05-12 DIAGNOSIS — N2581 Secondary hyperparathyroidism of renal origin: Secondary | ICD-10-CM | POA: Diagnosis not present

## 2018-05-14 DIAGNOSIS — D631 Anemia in chronic kidney disease: Secondary | ICD-10-CM | POA: Diagnosis not present

## 2018-05-14 DIAGNOSIS — D509 Iron deficiency anemia, unspecified: Secondary | ICD-10-CM | POA: Diagnosis not present

## 2018-05-14 DIAGNOSIS — N2581 Secondary hyperparathyroidism of renal origin: Secondary | ICD-10-CM | POA: Diagnosis not present

## 2018-05-14 DIAGNOSIS — N186 End stage renal disease: Secondary | ICD-10-CM | POA: Diagnosis not present

## 2018-05-17 DIAGNOSIS — N2581 Secondary hyperparathyroidism of renal origin: Secondary | ICD-10-CM | POA: Diagnosis not present

## 2018-05-17 DIAGNOSIS — N186 End stage renal disease: Secondary | ICD-10-CM | POA: Diagnosis not present

## 2018-05-17 DIAGNOSIS — D509 Iron deficiency anemia, unspecified: Secondary | ICD-10-CM | POA: Diagnosis not present

## 2018-05-17 DIAGNOSIS — D631 Anemia in chronic kidney disease: Secondary | ICD-10-CM | POA: Diagnosis not present

## 2018-05-19 DIAGNOSIS — N186 End stage renal disease: Secondary | ICD-10-CM | POA: Diagnosis not present

## 2018-05-19 DIAGNOSIS — N2581 Secondary hyperparathyroidism of renal origin: Secondary | ICD-10-CM | POA: Diagnosis not present

## 2018-05-19 DIAGNOSIS — D509 Iron deficiency anemia, unspecified: Secondary | ICD-10-CM | POA: Diagnosis not present

## 2018-05-19 DIAGNOSIS — D631 Anemia in chronic kidney disease: Secondary | ICD-10-CM | POA: Diagnosis not present

## 2018-05-21 DIAGNOSIS — D631 Anemia in chronic kidney disease: Secondary | ICD-10-CM | POA: Diagnosis not present

## 2018-05-21 DIAGNOSIS — D509 Iron deficiency anemia, unspecified: Secondary | ICD-10-CM | POA: Diagnosis not present

## 2018-05-21 DIAGNOSIS — N2581 Secondary hyperparathyroidism of renal origin: Secondary | ICD-10-CM | POA: Diagnosis not present

## 2018-05-21 DIAGNOSIS — N186 End stage renal disease: Secondary | ICD-10-CM | POA: Diagnosis not present

## 2018-05-24 DIAGNOSIS — N186 End stage renal disease: Secondary | ICD-10-CM | POA: Diagnosis not present

## 2018-05-24 DIAGNOSIS — D509 Iron deficiency anemia, unspecified: Secondary | ICD-10-CM | POA: Diagnosis not present

## 2018-05-24 DIAGNOSIS — N2581 Secondary hyperparathyroidism of renal origin: Secondary | ICD-10-CM | POA: Diagnosis not present

## 2018-05-24 DIAGNOSIS — D631 Anemia in chronic kidney disease: Secondary | ICD-10-CM | POA: Diagnosis not present

## 2018-05-26 DIAGNOSIS — N186 End stage renal disease: Secondary | ICD-10-CM | POA: Diagnosis not present

## 2018-05-26 DIAGNOSIS — D509 Iron deficiency anemia, unspecified: Secondary | ICD-10-CM | POA: Diagnosis not present

## 2018-05-26 DIAGNOSIS — D631 Anemia in chronic kidney disease: Secondary | ICD-10-CM | POA: Diagnosis not present

## 2018-05-26 DIAGNOSIS — N2581 Secondary hyperparathyroidism of renal origin: Secondary | ICD-10-CM | POA: Diagnosis not present

## 2018-05-28 DIAGNOSIS — N186 End stage renal disease: Secondary | ICD-10-CM | POA: Diagnosis not present

## 2018-05-28 DIAGNOSIS — D631 Anemia in chronic kidney disease: Secondary | ICD-10-CM | POA: Diagnosis not present

## 2018-05-28 DIAGNOSIS — N2581 Secondary hyperparathyroidism of renal origin: Secondary | ICD-10-CM | POA: Diagnosis not present

## 2018-05-28 DIAGNOSIS — D509 Iron deficiency anemia, unspecified: Secondary | ICD-10-CM | POA: Diagnosis not present

## 2018-05-30 DIAGNOSIS — Z992 Dependence on renal dialysis: Secondary | ICD-10-CM | POA: Diagnosis not present

## 2018-05-30 DIAGNOSIS — N186 End stage renal disease: Secondary | ICD-10-CM | POA: Diagnosis not present

## 2018-05-30 DIAGNOSIS — N033 Chronic nephritic syndrome with diffuse mesangial proliferative glomerulonephritis: Secondary | ICD-10-CM | POA: Diagnosis not present

## 2018-05-31 DIAGNOSIS — D509 Iron deficiency anemia, unspecified: Secondary | ICD-10-CM | POA: Diagnosis not present

## 2018-05-31 DIAGNOSIS — D631 Anemia in chronic kidney disease: Secondary | ICD-10-CM | POA: Diagnosis not present

## 2018-05-31 DIAGNOSIS — N2581 Secondary hyperparathyroidism of renal origin: Secondary | ICD-10-CM | POA: Diagnosis not present

## 2018-05-31 DIAGNOSIS — N186 End stage renal disease: Secondary | ICD-10-CM | POA: Diagnosis not present

## 2018-06-02 DIAGNOSIS — N2581 Secondary hyperparathyroidism of renal origin: Secondary | ICD-10-CM | POA: Diagnosis not present

## 2018-06-02 DIAGNOSIS — D631 Anemia in chronic kidney disease: Secondary | ICD-10-CM | POA: Diagnosis not present

## 2018-06-02 DIAGNOSIS — N186 End stage renal disease: Secondary | ICD-10-CM | POA: Diagnosis not present

## 2018-06-02 DIAGNOSIS — D509 Iron deficiency anemia, unspecified: Secondary | ICD-10-CM | POA: Diagnosis not present

## 2018-06-04 DIAGNOSIS — N2581 Secondary hyperparathyroidism of renal origin: Secondary | ICD-10-CM | POA: Diagnosis not present

## 2018-06-04 DIAGNOSIS — D509 Iron deficiency anemia, unspecified: Secondary | ICD-10-CM | POA: Diagnosis not present

## 2018-06-04 DIAGNOSIS — D631 Anemia in chronic kidney disease: Secondary | ICD-10-CM | POA: Diagnosis not present

## 2018-06-04 DIAGNOSIS — N186 End stage renal disease: Secondary | ICD-10-CM | POA: Diagnosis not present

## 2018-06-05 ENCOUNTER — Encounter (HOSPITAL_COMMUNITY): Payer: Self-pay | Admitting: Emergency Medicine

## 2018-06-05 ENCOUNTER — Emergency Department (HOSPITAL_COMMUNITY)
Admission: EM | Admit: 2018-06-05 | Discharge: 2018-06-06 | Disposition: A | Discharge status: Home / Self Care | Payer: Medicare Other | Attending: Emergency Medicine | Admitting: Emergency Medicine

## 2018-06-05 ENCOUNTER — Emergency Department (HOSPITAL_COMMUNITY): Payer: Medicare Other

## 2018-06-05 DIAGNOSIS — N186 End stage renal disease: Secondary | ICD-10-CM | POA: Insufficient documentation

## 2018-06-05 DIAGNOSIS — N3090 Cystitis, unspecified without hematuria: Secondary | ICD-10-CM

## 2018-06-05 DIAGNOSIS — E213 Hyperparathyroidism, unspecified: Secondary | ICD-10-CM | POA: Insufficient documentation

## 2018-06-05 DIAGNOSIS — I120 Hypertensive chronic kidney disease with stage 5 chronic kidney disease or end stage renal disease: Secondary | ICD-10-CM | POA: Diagnosis not present

## 2018-06-05 DIAGNOSIS — R300 Dysuria: Secondary | ICD-10-CM | POA: Diagnosis not present

## 2018-06-05 DIAGNOSIS — N401 Enlarged prostate with lower urinary tract symptoms: Secondary | ICD-10-CM | POA: Diagnosis not present

## 2018-06-05 DIAGNOSIS — Z79899 Other long term (current) drug therapy: Secondary | ICD-10-CM | POA: Insufficient documentation

## 2018-06-05 DIAGNOSIS — Z7722 Contact with and (suspected) exposure to environmental tobacco smoke (acute) (chronic): Secondary | ICD-10-CM | POA: Insufficient documentation

## 2018-06-05 LAB — URINALYSIS, ROUTINE W REFLEX MICROSCOPIC
Bilirubin Urine: NEGATIVE
Glucose, UA: 50 mg/dL — AB
Ketones, ur: NEGATIVE mg/dL
Leukocytes, UA: NEGATIVE
Nitrite: NEGATIVE
Protein, ur: >=300 mg/dL — AB
RBC / HPF: >50 RBC/hpf — ABNORMAL HIGH (ref 0–5)
Specific Gravity, Urine: 1.019 (ref 1.005–1.030)
Trans Epithel, UA: 1
WBC, UA: >50 WBC/hpf — ABNORMAL HIGH (ref 0–5)
pH: 9 — ABNORMAL HIGH (ref 5.0–8.0)

## 2018-06-05 LAB — BASIC METABOLIC PANEL
Anion gap: 23 — ABNORMAL HIGH (ref 5–15)
BUN: 45 mg/dL — ABNORMAL HIGH (ref 6–20)
CO2: 30 mmol/L (ref 22–32)
Calcium: 9.5 mg/dL (ref 8.9–10.3)
Chloride: 83 mmol/L — ABNORMAL LOW (ref 98–111)
Creatinine, Ser: 10.32 mg/dL — ABNORMAL HIGH (ref 0.61–1.24)
GFR calc Af Amer: 7 mL/min — ABNORMAL LOW (ref >60)
GFR calc non Af Amer: 6 mL/min — ABNORMAL LOW (ref >60)
Glucose, Bld: 111 mg/dL — ABNORMAL HIGH (ref 70–99)
Potassium: 3.8 mmol/L (ref 3.5–5.1)
Sodium: 136 mmol/L (ref 135–145)

## 2018-06-05 LAB — CBC
HCT: 32.4 % — ABNORMAL LOW (ref 39.0–52.0)
Hemoglobin: 10 g/dL — ABNORMAL LOW (ref 13.0–17.0)
MCH: 29.7 pg (ref 26.0–34.0)
MCHC: 30.9 g/dL (ref 30.0–36.0)
MCV: 96.1 fL (ref 78.0–100.0)
Platelets: 230 10*3/uL (ref 150–400)
RBC: 3.37 MIL/uL — ABNORMAL LOW (ref 4.22–5.81)
RDW: 15.7 % — ABNORMAL HIGH (ref 11.5–15.5)
WBC: 7 10*3/uL (ref 4.0–10.5)

## 2018-06-05 MED ORDER — OXYBUTYNIN CHLORIDE 5 MG PO TABS
5.0000 mg | ORAL_TABLET | Freq: Three times a day (TID) | ORAL | Status: DC
  Administered 2018-06-05: 5 mg via ORAL
  Filled 2018-06-05: qty 1

## 2018-06-05 MED ORDER — AMOXICILLIN-POT CLAVULANATE 500-125 MG PO TABS: 1.0000 | ORAL_TABLET | Freq: Every day | ORAL | 0 refills | Status: DC

## 2018-06-05 NOTE — Discharge Instructions (Addendum)
Please take augmentin as prescribed Stop doxycycline and sulfa medication Return if fever, chills,nausea or vomiting

## 2018-06-05 NOTE — ED Triage Notes (Signed)
Pt states he is dialysis pt, M W F. He states he has burning with urination since Wednesday. Denies penile discharge. Pt took 2 days of doxy, with no relief. His doctor switched him to sulfa and he is now taking that. Pt states he has no relief with the antibiotics. Pt has pain to groin/bladder.

## 2018-06-05 NOTE — ED Provider Notes (Signed)
[facility] EMERGENCY DEPARTMENT Provider Note   CSN: [id number] Arrival date & time: 06/05/18  1845     History   Chief Complaint Chief Complaint  Patient presents with  . Urinary Tract Infection    HPI Christopher Wang is a 32 y.o. male.  HPI 32 year old male end-stage renal disease secondary to nephrotic syndrome, anemia of chronic disease presents today complaining of pain with urination and increased frequency of urination for the past 3 days.  He states that on Wednesday he was put on doxycycline secondary to his symptoms.  He continued to complain of pain with urination yesterday and this was changed to Bactrim.  He states he has hesitancy and pain with urination.  He urinates up to 4-5 times a day since urinary tract infection symptoms that started.  He has not had fever, chills, nausea, vomiting, or diarrhea. Past Medical History:  Diagnosis Date  . Acute on chronic renal insufficiency 07/15/2013  . Anemia   . Edema of lower extremity 08/06/2011  . Enteritis 07/15/2013  . Hypertension 08/09/2011  . Nephrotic syndrome   . Scrotal edema 08/06/2011  . Syncope 08/03/2013    Patient Active Problem List   Diagnosis Date Noted  . Hyperparathyroidism ([place]) 11/06/2017  . Systolic heart failure, chronic ([place]) 11/06/2017  . Hypotension of hemodialysis 08/03/2013  . Anemia in chronic kidney disease 07/16/2013  . Uremia 07/15/2013  . ESRD on dialysis ([place]) 07/08/2013  . FSGS (focal segmental glomerulosclerosis) with nephrosis 08/15/2011  . Hypertension 08/09/2011  . Hyperlipidemia 08/07/2011  . Nephrotic syndrome 08/06/2011  . Serum albumin decreased 08/06/2011  . Proteinuria 08/06/2011  . Hypokalemia 08/06/2011    Past Surgical History:  Procedure Laterality Date  . AV FISTULA PLACEMENT Left 07/18/2013   Procedure: ARTERIOVENOUS (AV) FISTULA CREATION ;  Surgeon: Rosetta Posner, MD;  Location: [facility];  Service: Vascular;  Laterality: Left;  . INSERTION OF  DIALYSIS CATHETER Right 07/18/2013   Procedure: INSERTION OF DIALYSIS CATHETER;  Surgeon: Rosetta Posner, MD;  Location: [facility];  Service: Vascular;  Laterality: Right;  . RENAL BIOPSY          Home Medications    Prior to Admission medications   Medication Sig Start Date End Date Taking? Authorizing Provider  FOSRENOL 1000 MG PACK Take 1,000 mg by mouth 2 (two) times daily with a meal.  08/06/17   [provider]    Family History Family History  Problem Relation Age of Onset  . Hypertension Mother     Social History Social History   Tobacco Use  . Smoking status: Passive Smoke Exposure - Never Smoker  . Smokeless tobacco: Never Used  Substance Use Topics  . Alcohol use: Yes    Alcohol/week: 1.0 standard drinks    Types: 1 Shots of liquor per week  . Drug use: No     Allergies   Patient has no known allergies.   Review of Systems Review of Systems  All other systems reviewed and are negative.    Physical Exam Updated Vital Signs BP (!) 129/91 (BP Location: Right Arm)   Pulse 100   Temp 99.1 F (37.3 C) (Oral)   Resp 16   SpO2 99%  Patient does not wish to get undressed, or sit or lie on the bed for exam.  He is pacing the room. Physical Exam  Constitutional: He is oriented to person, place, and time. He appears well-developed and well-nourished.  HENT:  Head: Normocephalic and atraumatic.  Right Ear: External ear normal.  Left Ear: External ear normal.  Mouth/Throat: Oropharynx is clear and moist.  Eyes: Pupils are equal, round, and reactive to light. EOM are normal.  Neck: Normal range of motion.  Cardiovascular: Normal rate and regular rhythm.  Pulmonary/Chest: Effort normal.  Abdominal: Soft. He exhibits distension.  Some suprapubic tenderness to palpation  Musculoskeletal: Normal range of motion.  Neurological: He is alert and oriented to person, place, and time.  Skin: Skin is warm.  Nursing note and vitals reviewed.    ED  Treatments / Results  Labs (all labs ordered are listed, but only abnormal results are displayed) Labs Reviewed  URINALYSIS, ROUTINE W REFLEX MICROSCOPIC - Abnormal; Notable for the following components:      Result Value   APPearance HAZY (*)    pH 9.0 (*)    Glucose, UA 50 (*)    Hgb urine dipstick SMALL (*)    Protein, ur >=300 (*)    RBC / HPF >50 (*)    WBC, UA >50 (*)    Bacteria, UA RARE (*)    All other components within normal limits  BASIC METABOLIC PANEL - Abnormal; Notable for the following components:   Chloride 83 (*)    Glucose, Bld 111 (*)    BUN 45 (*)    Creatinine, Ser 10.32 (*)    GFR calc non Af Amer 6 (*)    GFR calc Af Amer 7 (*)    Anion gap 23 (*)    All other components within normal limits  CBC - Abnormal; Notable for the following components:   RBC 3.37 (*)    Hemoglobin 10.0 (*)    HCT 32.4 (*)    RDW 15.7 (*)    All other components within normal limits  URINE CULTURE    EKG None  Radiology No results found.  Procedures Procedures (including critical care time)  Medications Ordered in ED Medications - No data to display   Initial Impression / Assessment and Plan / ED Course  I have reviewed the triage vital signs and the nursing notes.  Pertinent labs & imaging results that were available during my care of the patient were reviewed by me and considered in my medical decision making (see chart for details).    Discussed with Dr. Jonnie Finner, on-call for nephrology.  Will change antibiotics to Augmentin 500 mg 1 time per day CT results reviewed and no evidence of kidney stones.  T consistent with cystitis.  Will change to Augmentin.  Discussed return precautions and need for follow-up with patient. Final Clinical Impressions(s) / ED Diagnoses   Final diagnoses:  Cystitis    ED Discharge Orders         Ordered    amoxicillin-clavulanate (AUGMENTIN) 500-125 MG tablet  Daily     06/05/18 2316           Pattricia Boss,  MD 06/05/18 2318

## 2018-06-05 NOTE — ED Notes (Signed)
Bladder scan volume: 0 mL. 

## 2018-06-05 NOTE — ED Notes (Signed)
Patient verbalizes understanding of medications and discharge instructions. No further questions at this time. VSS and patient ambulatory at discharge.   

## 2018-06-06 DIAGNOSIS — N3090 Cystitis, unspecified without hematuria: Secondary | ICD-10-CM | POA: Diagnosis not present

## 2018-06-06 MED ORDER — DIPHENHYDRAMINE HCL 25 MG PO CAPS
25.0000 mg | ORAL_CAPSULE | Freq: Once | ORAL | Status: AC
  Administered 2018-06-06: 25 mg via ORAL
  Filled 2018-06-06: qty 1

## 2018-06-06 NOTE — ED Notes (Signed)
After patient was discharged he requested to speak with MD again because "the pain is still there, I just need something to help me sleep".   Dr. Jeanell Sparrow notified and prescribed 25 mg Benadryl.

## 2018-06-07 DIAGNOSIS — N2581 Secondary hyperparathyroidism of renal origin: Secondary | ICD-10-CM | POA: Diagnosis not present

## 2018-06-07 DIAGNOSIS — D509 Iron deficiency anemia, unspecified: Secondary | ICD-10-CM | POA: Diagnosis not present

## 2018-06-07 DIAGNOSIS — N186 End stage renal disease: Secondary | ICD-10-CM | POA: Diagnosis not present

## 2018-06-07 DIAGNOSIS — D631 Anemia in chronic kidney disease: Secondary | ICD-10-CM | POA: Diagnosis not present

## 2018-06-07 LAB — URINE CULTURE: Culture: <10000 — AB

## 2018-06-09 DIAGNOSIS — N186 End stage renal disease: Secondary | ICD-10-CM | POA: Diagnosis not present

## 2018-06-09 DIAGNOSIS — D631 Anemia in chronic kidney disease: Secondary | ICD-10-CM | POA: Diagnosis not present

## 2018-06-09 DIAGNOSIS — N2581 Secondary hyperparathyroidism of renal origin: Secondary | ICD-10-CM | POA: Diagnosis not present

## 2018-06-09 DIAGNOSIS — D509 Iron deficiency anemia, unspecified: Secondary | ICD-10-CM | POA: Diagnosis not present

## 2018-06-11 DIAGNOSIS — N186 End stage renal disease: Secondary | ICD-10-CM | POA: Diagnosis not present

## 2018-06-11 DIAGNOSIS — D631 Anemia in chronic kidney disease: Secondary | ICD-10-CM | POA: Diagnosis not present

## 2018-06-11 DIAGNOSIS — N2581 Secondary hyperparathyroidism of renal origin: Secondary | ICD-10-CM | POA: Diagnosis not present

## 2018-06-11 DIAGNOSIS — D509 Iron deficiency anemia, unspecified: Secondary | ICD-10-CM | POA: Diagnosis not present

## 2018-06-14 DIAGNOSIS — N186 End stage renal disease: Secondary | ICD-10-CM | POA: Diagnosis not present

## 2018-06-14 DIAGNOSIS — N2581 Secondary hyperparathyroidism of renal origin: Secondary | ICD-10-CM | POA: Diagnosis not present

## 2018-06-14 DIAGNOSIS — D509 Iron deficiency anemia, unspecified: Secondary | ICD-10-CM | POA: Diagnosis not present

## 2018-06-14 DIAGNOSIS — D631 Anemia in chronic kidney disease: Secondary | ICD-10-CM | POA: Diagnosis not present

## 2018-06-17 DIAGNOSIS — N2581 Secondary hyperparathyroidism of renal origin: Secondary | ICD-10-CM | POA: Diagnosis not present

## 2018-06-17 DIAGNOSIS — D509 Iron deficiency anemia, unspecified: Secondary | ICD-10-CM | POA: Diagnosis not present

## 2018-06-17 DIAGNOSIS — N186 End stage renal disease: Secondary | ICD-10-CM | POA: Diagnosis not present

## 2018-06-17 DIAGNOSIS — D631 Anemia in chronic kidney disease: Secondary | ICD-10-CM | POA: Diagnosis not present

## 2018-06-18 DIAGNOSIS — N2581 Secondary hyperparathyroidism of renal origin: Secondary | ICD-10-CM | POA: Diagnosis not present

## 2018-06-18 DIAGNOSIS — D509 Iron deficiency anemia, unspecified: Secondary | ICD-10-CM | POA: Diagnosis not present

## 2018-06-18 DIAGNOSIS — D631 Anemia in chronic kidney disease: Secondary | ICD-10-CM | POA: Diagnosis not present

## 2018-06-18 DIAGNOSIS — N186 End stage renal disease: Secondary | ICD-10-CM | POA: Diagnosis not present

## 2018-06-21 DIAGNOSIS — D509 Iron deficiency anemia, unspecified: Secondary | ICD-10-CM | POA: Diagnosis not present

## 2018-06-21 DIAGNOSIS — D631 Anemia in chronic kidney disease: Secondary | ICD-10-CM | POA: Diagnosis not present

## 2018-06-21 DIAGNOSIS — N2581 Secondary hyperparathyroidism of renal origin: Secondary | ICD-10-CM | POA: Diagnosis not present

## 2018-06-21 DIAGNOSIS — N186 End stage renal disease: Secondary | ICD-10-CM | POA: Diagnosis not present

## 2018-06-23 DIAGNOSIS — N2581 Secondary hyperparathyroidism of renal origin: Secondary | ICD-10-CM | POA: Diagnosis not present

## 2018-06-23 DIAGNOSIS — D509 Iron deficiency anemia, unspecified: Secondary | ICD-10-CM | POA: Diagnosis not present

## 2018-06-23 DIAGNOSIS — N186 End stage renal disease: Secondary | ICD-10-CM | POA: Diagnosis not present

## 2018-06-23 DIAGNOSIS — D631 Anemia in chronic kidney disease: Secondary | ICD-10-CM | POA: Diagnosis not present

## 2018-06-25 DIAGNOSIS — D509 Iron deficiency anemia, unspecified: Secondary | ICD-10-CM | POA: Diagnosis not present

## 2018-06-25 DIAGNOSIS — N186 End stage renal disease: Secondary | ICD-10-CM | POA: Diagnosis not present

## 2018-06-25 DIAGNOSIS — N2581 Secondary hyperparathyroidism of renal origin: Secondary | ICD-10-CM | POA: Diagnosis not present

## 2018-06-25 DIAGNOSIS — D631 Anemia in chronic kidney disease: Secondary | ICD-10-CM | POA: Diagnosis not present

## 2018-06-28 DIAGNOSIS — N186 End stage renal disease: Secondary | ICD-10-CM | POA: Diagnosis not present

## 2018-06-28 DIAGNOSIS — D509 Iron deficiency anemia, unspecified: Secondary | ICD-10-CM | POA: Diagnosis not present

## 2018-06-28 DIAGNOSIS — D631 Anemia in chronic kidney disease: Secondary | ICD-10-CM | POA: Diagnosis not present

## 2018-06-28 DIAGNOSIS — N2581 Secondary hyperparathyroidism of renal origin: Secondary | ICD-10-CM | POA: Diagnosis not present

## 2018-06-29 DIAGNOSIS — Z992 Dependence on renal dialysis: Secondary | ICD-10-CM | POA: Diagnosis not present

## 2018-06-29 DIAGNOSIS — N186 End stage renal disease: Secondary | ICD-10-CM | POA: Diagnosis not present

## 2018-06-29 DIAGNOSIS — N033 Chronic nephritic syndrome with diffuse mesangial proliferative glomerulonephritis: Secondary | ICD-10-CM | POA: Diagnosis not present

## 2018-06-30 DIAGNOSIS — N186 End stage renal disease: Secondary | ICD-10-CM | POA: Diagnosis not present

## 2018-06-30 DIAGNOSIS — D509 Iron deficiency anemia, unspecified: Secondary | ICD-10-CM | POA: Diagnosis not present

## 2018-06-30 DIAGNOSIS — D631 Anemia in chronic kidney disease: Secondary | ICD-10-CM | POA: Diagnosis not present

## 2018-06-30 DIAGNOSIS — N2581 Secondary hyperparathyroidism of renal origin: Secondary | ICD-10-CM | POA: Diagnosis not present

## 2018-06-30 DIAGNOSIS — Z23 Encounter for immunization: Secondary | ICD-10-CM | POA: Diagnosis not present

## 2018-07-02 DIAGNOSIS — N186 End stage renal disease: Secondary | ICD-10-CM | POA: Diagnosis not present

## 2018-07-02 DIAGNOSIS — N2581 Secondary hyperparathyroidism of renal origin: Secondary | ICD-10-CM | POA: Diagnosis not present

## 2018-07-02 DIAGNOSIS — Z23 Encounter for immunization: Secondary | ICD-10-CM | POA: Diagnosis not present

## 2018-07-02 DIAGNOSIS — D631 Anemia in chronic kidney disease: Secondary | ICD-10-CM | POA: Diagnosis not present

## 2018-07-02 DIAGNOSIS — D509 Iron deficiency anemia, unspecified: Secondary | ICD-10-CM | POA: Diagnosis not present

## 2018-07-05 DIAGNOSIS — D509 Iron deficiency anemia, unspecified: Secondary | ICD-10-CM | POA: Diagnosis not present

## 2018-07-05 DIAGNOSIS — Z23 Encounter for immunization: Secondary | ICD-10-CM | POA: Diagnosis not present

## 2018-07-05 DIAGNOSIS — N186 End stage renal disease: Secondary | ICD-10-CM | POA: Diagnosis not present

## 2018-07-05 DIAGNOSIS — D631 Anemia in chronic kidney disease: Secondary | ICD-10-CM | POA: Diagnosis not present

## 2018-07-05 DIAGNOSIS — N2581 Secondary hyperparathyroidism of renal origin: Secondary | ICD-10-CM | POA: Diagnosis not present

## 2018-07-07 DIAGNOSIS — D631 Anemia in chronic kidney disease: Secondary | ICD-10-CM | POA: Diagnosis not present

## 2018-07-07 DIAGNOSIS — N2581 Secondary hyperparathyroidism of renal origin: Secondary | ICD-10-CM | POA: Diagnosis not present

## 2018-07-07 DIAGNOSIS — N186 End stage renal disease: Secondary | ICD-10-CM | POA: Diagnosis not present

## 2018-07-07 DIAGNOSIS — D509 Iron deficiency anemia, unspecified: Secondary | ICD-10-CM | POA: Diagnosis not present

## 2018-07-07 DIAGNOSIS — Z23 Encounter for immunization: Secondary | ICD-10-CM | POA: Diagnosis not present

## 2018-07-09 DIAGNOSIS — N186 End stage renal disease: Secondary | ICD-10-CM | POA: Diagnosis not present

## 2018-07-09 DIAGNOSIS — N2581 Secondary hyperparathyroidism of renal origin: Secondary | ICD-10-CM | POA: Diagnosis not present

## 2018-07-09 DIAGNOSIS — Z23 Encounter for immunization: Secondary | ICD-10-CM | POA: Diagnosis not present

## 2018-07-09 DIAGNOSIS — D631 Anemia in chronic kidney disease: Secondary | ICD-10-CM | POA: Diagnosis not present

## 2018-07-09 DIAGNOSIS — D509 Iron deficiency anemia, unspecified: Secondary | ICD-10-CM | POA: Diagnosis not present

## 2018-07-12 DIAGNOSIS — D631 Anemia in chronic kidney disease: Secondary | ICD-10-CM | POA: Diagnosis not present

## 2018-07-12 DIAGNOSIS — N186 End stage renal disease: Secondary | ICD-10-CM | POA: Diagnosis not present

## 2018-07-12 DIAGNOSIS — Z23 Encounter for immunization: Secondary | ICD-10-CM | POA: Diagnosis not present

## 2018-07-12 DIAGNOSIS — D509 Iron deficiency anemia, unspecified: Secondary | ICD-10-CM | POA: Diagnosis not present

## 2018-07-12 DIAGNOSIS — N2581 Secondary hyperparathyroidism of renal origin: Secondary | ICD-10-CM | POA: Diagnosis not present

## 2018-07-14 ENCOUNTER — Other Ambulatory Visit: Payer: Self-pay

## 2018-07-14 ENCOUNTER — Encounter: Payer: Self-pay | Admitting: *Deleted

## 2018-07-14 ENCOUNTER — Other Ambulatory Visit: Payer: Self-pay | Admitting: *Deleted

## 2018-07-14 ENCOUNTER — Ambulatory Visit (INDEPENDENT_AMBULATORY_CARE_PROVIDER_SITE_OTHER): Payer: Medicare Other | Admitting: Physician Assistant

## 2018-07-14 ENCOUNTER — Encounter (HOSPITAL_COMMUNITY): Payer: Self-pay | Admitting: *Deleted

## 2018-07-14 VITALS — BP 120/88 | HR 95 | Temp 97.6°F | Resp 16 | Ht 72.0 in | Wt 145.0 lb

## 2018-07-14 DIAGNOSIS — Z992 Dependence on renal dialysis: Secondary | ICD-10-CM | POA: Diagnosis not present

## 2018-07-14 DIAGNOSIS — D509 Iron deficiency anemia, unspecified: Secondary | ICD-10-CM | POA: Diagnosis not present

## 2018-07-14 DIAGNOSIS — Z23 Encounter for immunization: Secondary | ICD-10-CM | POA: Diagnosis not present

## 2018-07-14 DIAGNOSIS — N186 End stage renal disease: Secondary | ICD-10-CM | POA: Diagnosis not present

## 2018-07-14 DIAGNOSIS — N2581 Secondary hyperparathyroidism of renal origin: Secondary | ICD-10-CM | POA: Diagnosis not present

## 2018-07-14 DIAGNOSIS — D631 Anemia in chronic kidney disease: Secondary | ICD-10-CM | POA: Diagnosis not present

## 2018-07-14 NOTE — Progress Notes (Signed)
HISTORY AND PHYSICAL     CC:  Blister on fistula Requesting Provider:  Mauricia Area, MD  HPI: This is a 32 y.o. male who had a left radial cephalic AVF created on 35/36/14 by Dr. Donnetta Hutching.  He ws seen by Dr. Bridgett Larsson in August 2019 and at that time, Dr. Bridgett Larsson offered him a plication of the fistula and was scheduled but did not take place.    The pt presents today from the dialysis center with concerns of a blister on his aneurysmal fistula.  The pt states it has not bled.  He states the scab acted like it wanted to come off last week but did not bleed.   They are accessing his fistula with both needles proximally just below the antecubital space.   He is on dialysis M/W/F at the [facility] location.  He is not on any blood thinners.  He wants to start the process for transplant.   Past Medical History:  Diagnosis Date  . Acute on chronic renal insufficiency 07/15/2013  . Anemia   . Edema of lower extremity 08/06/2011  . Enteritis 07/15/2013  . Hypertension 08/09/2011  . Nephrotic syndrome   . Scrotal edema 08/06/2011  . Syncope 08/03/2013    Past Surgical History:  Procedure Laterality Date  . AV FISTULA PLACEMENT Left 07/18/2013   Procedure: ARTERIOVENOUS (AV) FISTULA CREATION ;  Surgeon: Rosetta Posner, MD;  Location: [place];  Service: Vascular;  Laterality: Left;  . INSERTION OF DIALYSIS CATHETER Right 07/18/2013   Procedure: INSERTION OF DIALYSIS CATHETER;  Surgeon: Rosetta Posner, MD;  Location: [place];  Service: Vascular;  Laterality: Right;  . RENAL BIOPSY      No Known Allergies  Current Outpatient Medications  Medication Sig Dispense Refill  . doxycycline (VIBRAMYCIN) 100 MG capsule Take 100 mg by mouth 2 (two) times daily.  0  . FOSRENOL 1000 MG PACK Take 1,000 mg by mouth 2 (two) times daily with a meal.     . promethazine (PHENERGAN) 12.5 MG tablet TK 1 T PO Q 6 H PRF NAUSEA AND VOMITING  10  . amoxicillin-clavulanate (AUGMENTIN) 500-125 MG tablet Take 1 tablet (500 mg total)  by mouth daily. (Patient not taking: Reported on 07/14/2018) 7 tablet 0  . sulfamethoxazole-trimethoprim (BACTRIM DS,SEPTRA DS) 800-160 MG tablet Take 0.5 tablets by mouth daily.      No current facility-administered medications for this visit.     Family History  Problem Relation Age of Onset  . Hypertension Mother     Social History   Socioeconomic History  . Marital status: Single    Spouse name: Not on file  . Number of children: 0  . Years of education: Not on file  . Highest education level: High school graduate  Occupational History  . Occupation: unemployed    Comment: Passenger transport manager  Social Needs  . Financial resource strain: Not very hard  . Food insecurity:    Worry: Sometimes true    Inability: Never true  . Transportation needs:    Medical: No    Non-medical: No  Tobacco Use  . Smoking status: Passive Smoke Exposure - Never Smoker  . Smokeless tobacco: Never Used  Substance and Sexual Activity  . Alcohol use: Yes    Alcohol/week: 1.0 standard drinks    Types: 1 Shots of liquor per week  . Drug use: No  . Sexual activity: Not Currently    Birth control/protection: None  Lifestyle  . Physical activity:  Days per week: Not on file    Minutes per session: Not on file  . Stress: Not on file  Relationships  . Social connections:    Talks on phone: Not on file    Gets together: Not on file    Attends religious service: Not on file    Active member of club or organization: Not on file    Attends meetings of clubs or organizations: Not on file    Relationship status: Not on file  . Intimate partner violence:    Fear of current or ex partner: Not on file    Emotionally abused: Not on file    Physically abused: Not on file    Forced sexual activity: Not on file  Other Topics Concern  . Not on file  Social History Narrative   Lives with a friend.   Family lives in [place], [place] and in [place].     REVIEW OF SYSTEMS:   [X]  denotes positive finding, [  ] denotes negative finding Cardiac  Comments:  Chest pain or chest pressure:    Shortness of breath upon exertion:    Short of breath when lying flat:    Irregular heart rhythm:        Vascular    Pain in calf, thigh, or hip brought on by ambulation:    Pain in feet at night that wakes you up from your sleep:     Blood clot in your veins:    Leg swelling:         Pulmonary    Oxygen at home:    Productive cough:     Wheezing:         Neurologic    Sudden weakness in arms or legs:     Sudden numbness in arms or legs:     Sudden onset of difficulty speaking or slurred speech:    Temporary loss of vision in one eye:     Problems with dizziness:         Gastrointestinal    Blood in stool:     Vomited blood:         Genitourinary    Burning when urinating:     Blood in urine:        Psychiatric    Major depression:         Hematologic    Bleeding problems:    Problems with blood clotting too easily:        Skin    Rashes or ulcers:        Constitutional    Fever or chills:      PHYSICAL EXAMINATION:  Today's Vitals   07/14/18 1509  BP: 120/88  Pulse: 95  Resp: 16  Temp: 97.6 F (36.4 C)  TempSrc: Oral  SpO2: 100%  Weight: 145 lb (65.8 kg)  Height: 6' (1.829 m)   Body mass index is 19.67 kg/m.  General:  WDWN in NAD; vital signs documented above Gait: Not observed HENT: WNL, normocephalic Pulmonary: normal non-labored breathing , without Rales, rhonchi,  wheezing Cardiac: regular HR, without  Murmurs without carotid bruits Abdomen: soft, NT, no masses Skin: without rashes Vascular Exam/Pulses: Palpable bilateral radial pulses Extremities: the left RC AVF is aneurysmal with a ulcer present    Musculoskeletal: no muscle wasting or atrophy  Neurologic: A&O X 3;  No focal weakness or paresthesias are detected Psychiatric:  The pt has Normal affect.   Non-Invasive Vascular Imaging:   None today  Pt  meds includes: Statin:  No. Beta Blocker:   No. Aspirin:  No. ACEI:  No. ARB:  No. CCB use:  No Other Antiplatelet/Anticoagulant:  No   ASSESSMENT/PLAN:: 32 y.o. male with ESRD who dialyzes M/W/F at the [facility] location with ulceration on aneurysmal area of fistula  -discussed revision of the fistula with the pt - discussed that he may need a tunneled catheter if there is not enough fistula to stick after revision.  Pt wants to wait a week until his day off and I discussed with him that this needs to be repaired sooner rather than later and that if this bleeds, it could be life threatening.  I discussed with pt where to hold pressure should he have a bleeding event and call 911.  Dr. Scot Dock also evaluated the pt and gave the pt the option of ligating the fistula and placing [facility] and creating a left BC AVF but the pt does not want to do this.  Hopefully will schedule for tomorrow as Dr. Donzetta Matters does have an opening for revision tomorrow. Again, reiterated with pt that this could be life threatening if not addressed.  He expressed understanding.    Leontine Locket, PA-C [facility] [phone]  Clinic MD:  Pt seen and examined with Dr. Scot Dock

## 2018-07-14 NOTE — Progress Notes (Signed)
Pt denies SOB, chest pain, and being under the care of a cardiologist. Pt denies having a cardiac cath. Pt denies having a chest x ray and EKG within the last year.. Pt made aware to stop taking vitamins, fish oil, and herbal medications. Do not take any NSAIDs ie: Ibuprofen, Advil, Naproxen (Aleve), Motrin, BC and Goody Powder. Pt verbalized understanding of all pre-op instructions.

## 2018-07-15 ENCOUNTER — Ambulatory Visit (HOSPITAL_COMMUNITY): Payer: Medicare Other | Admitting: Certified Registered Nurse Anesthetist

## 2018-07-15 ENCOUNTER — Encounter (HOSPITAL_COMMUNITY): Payer: Self-pay | Admitting: *Deleted

## 2018-07-15 ENCOUNTER — Observation Stay (HOSPITAL_COMMUNITY)
Admission: RE | Admit: 2018-07-15 | Discharge: 2018-07-16 | Disposition: A | Discharge status: Home / Self Care | Payer: Medicare Other | Source: Ambulatory Visit | Attending: Vascular Surgery | Admitting: Vascular Surgery

## 2018-07-15 ENCOUNTER — Other Ambulatory Visit: Payer: Self-pay

## 2018-07-15 ENCOUNTER — Encounter (HOSPITAL_COMMUNITY)
Admission: RE | Disposition: A | Discharge status: Home / Self Care | Payer: Self-pay | Source: Ambulatory Visit | Attending: Vascular Surgery

## 2018-07-15 ENCOUNTER — Telehealth: Payer: Self-pay | Admitting: Vascular Surgery

## 2018-07-15 DIAGNOSIS — Y832 Surgical operation with anastomosis, bypass or graft as the cause of abnormal reaction of the patient, or of later complication, without mention of misadventure at the time of the procedure: Secondary | ICD-10-CM | POA: Diagnosis not present

## 2018-07-15 DIAGNOSIS — I120 Hypertensive chronic kidney disease with stage 5 chronic kidney disease or end stage renal disease: Secondary | ICD-10-CM | POA: Insufficient documentation

## 2018-07-15 DIAGNOSIS — T82898A Other specified complication of vascular prosthetic devices, implants and grafts, initial encounter: Principal | ICD-10-CM | POA: Insufficient documentation

## 2018-07-15 DIAGNOSIS — Z8249 Family history of ischemic heart disease and other diseases of the circulatory system: Secondary | ICD-10-CM | POA: Insufficient documentation

## 2018-07-15 DIAGNOSIS — N186 End stage renal disease: Secondary | ICD-10-CM | POA: Diagnosis present

## 2018-07-15 DIAGNOSIS — I132 Hypertensive heart and chronic kidney disease with heart failure and with stage 5 chronic kidney disease, or end stage renal disease: Secondary | ICD-10-CM | POA: Diagnosis not present

## 2018-07-15 DIAGNOSIS — I5022 Chronic systolic (congestive) heart failure: Secondary | ICD-10-CM | POA: Diagnosis not present

## 2018-07-15 DIAGNOSIS — Z992 Dependence on renal dialysis: Secondary | ICD-10-CM | POA: Insufficient documentation

## 2018-07-15 DIAGNOSIS — Z79899 Other long term (current) drug therapy: Secondary | ICD-10-CM | POA: Insufficient documentation

## 2018-07-15 HISTORY — DX: Headache: R51

## 2018-07-15 HISTORY — DX: Dependence on renal dialysis: Z99.2

## 2018-07-15 HISTORY — DX: Dependence on renal dialysis: N18.6

## 2018-07-15 HISTORY — PX: REVISON OF ARTERIOVENOUS FISTULA: SHX6074

## 2018-07-15 HISTORY — DX: Headache, unspecified: R51.9

## 2018-07-15 HISTORY — DX: Urinary tract infection, site not specified: N39.0

## 2018-07-15 LAB — POCT I-STAT 4, (NA,K, GLUC, HGB,HCT)
Glucose, Bld: 78 mg/dL (ref 70–99)
HCT: 43 % (ref 39.0–52.0)
Hemoglobin: 14.6 g/dL (ref 13.0–17.0)
Potassium: 4 mmol/L (ref 3.5–5.1)
Sodium: 136 mmol/L (ref 135–145)

## 2018-07-15 SURGERY — REVISON OF ARTERIOVENOUS FISTULA
Anesthesia: General | Site: Arm Upper

## 2018-07-15 MED ORDER — DEXAMETHASONE SODIUM PHOSPHATE 10 MG/ML IJ SOLN
INTRAMUSCULAR | Status: AC
  Filled 2018-07-15: qty 1

## 2018-07-15 MED ORDER — ONDANSETRON HCL 4 MG/2ML IJ SOLN: 4.0000 mg | Freq: Four times a day (QID) | INTRAMUSCULAR | Status: DC | PRN

## 2018-07-15 MED ORDER — OXYCODONE-ACETAMINOPHEN 5-325 MG PO TABS: 1.0000 | ORAL_TABLET | Freq: Four times a day (QID) | ORAL | 0 refills | Status: DC | PRN

## 2018-07-15 MED ORDER — FENTANYL CITRATE (PF) 250 MCG/5ML IJ SOLN
INTRAMUSCULAR | Status: AC
  Filled 2018-07-15: qty 5

## 2018-07-15 MED ORDER — ACETAMINOPHEN 325 MG RE SUPP: 325.0000 mg | RECTAL | Status: DC | PRN

## 2018-07-15 MED ORDER — HYDRALAZINE HCL 20 MG/ML IJ SOLN: 5.0000 mg | INTRAMUSCULAR | Status: DC | PRN

## 2018-07-15 MED ORDER — ACETAMINOPHEN 325 MG PO TABS: 325.0000 mg | ORAL_TABLET | ORAL | Status: DC | PRN

## 2018-07-15 MED ORDER — ONDANSETRON HCL 4 MG/2ML IJ SOLN
4.0000 mg | Freq: Once | INTRAMUSCULAR | Status: AC | PRN
  Administered 2018-07-15: 4 mg via INTRAVENOUS

## 2018-07-15 MED ORDER — OXYCODONE HCL 5 MG/5ML PO SOLN: 5.0000 mg | Freq: Once | ORAL | Status: DC | PRN

## 2018-07-15 MED ORDER — HEPARIN SODIUM (PORCINE) 1000 UNIT/ML IJ SOLN
INTRAMUSCULAR | Status: AC
  Filled 2018-07-15: qty 1

## 2018-07-15 MED ORDER — SODIUM CHLORIDE 0.9 % IV SOLN
INTRAVENOUS | Status: DC
  Administered 2018-07-15 (×2): via INTRAVENOUS

## 2018-07-15 MED ORDER — OXYCODONE HCL 5 MG PO TABS: 5.0000 mg | ORAL_TABLET | Freq: Once | ORAL | Status: DC | PRN

## 2018-07-15 MED ORDER — PROPOFOL 10 MG/ML IV BOLUS
INTRAVENOUS | Status: AC
  Filled 2018-07-15: qty 20

## 2018-07-15 MED ORDER — POLYETHYLENE GLYCOL 3350 17 G PO PACK: 17.0000 g | PACK | Freq: Every day | ORAL | Status: DC | PRN

## 2018-07-15 MED ORDER — PROTAMINE SULFATE 10 MG/ML IV SOLN
INTRAVENOUS | Status: DC | PRN
  Administered 2018-07-15: 10 mg via INTRAVENOUS
  Administered 2018-07-15: 40 mg via INTRAVENOUS

## 2018-07-15 MED ORDER — PHENYLEPHRINE HCL 10 MG/ML IJ SOLN
INTRAMUSCULAR | Status: DC | PRN
  Administered 2018-07-15: 80 ug via INTRAVENOUS
  Administered 2018-07-15 (×2): 120 ug via INTRAVENOUS
  Administered 2018-07-15 (×2): 80 ug via INTRAVENOUS

## 2018-07-15 MED ORDER — POTASSIUM CHLORIDE CRYS ER 20 MEQ PO TBCR: 20.0000 meq | EXTENDED_RELEASE_TABLET | Freq: Once | ORAL | Status: DC

## 2018-07-15 MED ORDER — PROPOFOL 10 MG/ML IV BOLUS
INTRAVENOUS | Status: DC | PRN
  Administered 2018-07-15: 180 mg via INTRAVENOUS

## 2018-07-15 MED ORDER — METOPROLOL TARTRATE 5 MG/5ML IV SOLN: 2.0000 mg | INTRAVENOUS | Status: DC | PRN

## 2018-07-15 MED ORDER — GUAIFENESIN-DM 100-10 MG/5ML PO SYRP: 15.0000 mL | ORAL_SOLUTION | ORAL | Status: DC | PRN

## 2018-07-15 MED ORDER — MIDAZOLAM HCL 2 MG/2ML IJ SOLN
INTRAMUSCULAR | Status: DC | PRN
  Administered 2018-07-15: 2 mg via INTRAVENOUS

## 2018-07-15 MED ORDER — LABETALOL HCL 5 MG/ML IV SOLN: 10.0000 mg | INTRAVENOUS | Status: DC | PRN

## 2018-07-15 MED ORDER — PROMETHAZINE HCL 25 MG PO TABS
12.5000 mg | ORAL_TABLET | ORAL | Status: DC
  Administered 2018-07-15: 12.5 mg via ORAL
  Filled 2018-07-15: qty 1

## 2018-07-15 MED ORDER — LIDOCAINE 2% (20 MG/ML) 5 ML SYRINGE
INTRAMUSCULAR | Status: AC
  Filled 2018-07-15: qty 5

## 2018-07-15 MED ORDER — BISACODYL 10 MG RE SUPP: 10.0000 mg | Freq: Every day | RECTAL | Status: DC | PRN

## 2018-07-15 MED ORDER — 0.9 % SODIUM CHLORIDE (POUR BTL) OPTIME
TOPICAL | Status: DC | PRN
  Administered 2018-07-15: 1000 mL

## 2018-07-15 MED ORDER — CEFAZOLIN SODIUM-DEXTROSE 2-4 GM/100ML-% IV SOLN
2.0000 g | INTRAVENOUS | Status: AC
  Administered 2018-07-15: 2 g via INTRAVENOUS
  Filled 2018-07-15: qty 100

## 2018-07-15 MED ORDER — LANTHANUM CARBONATE 1000 MG PO PACK: 1000.0000 mg | PACK | Freq: Two times a day (BID) | ORAL | Status: DC

## 2018-07-15 MED ORDER — PANTOPRAZOLE SODIUM 40 MG PO TBEC
40.0000 mg | DELAYED_RELEASE_TABLET | Freq: Every day | ORAL | Status: DC
  Administered 2018-07-15 – 2018-07-16 (×2): 40 mg via ORAL
  Filled 2018-07-15 (×2): qty 1

## 2018-07-15 MED ORDER — LIDOCAINE 2% (20 MG/ML) 5 ML SYRINGE
INTRAMUSCULAR | Status: DC | PRN
  Administered 2018-07-15: 100 mg via INTRAVENOUS

## 2018-07-15 MED ORDER — DEXAMETHASONE SODIUM PHOSPHATE 10 MG/ML IJ SOLN
INTRAMUSCULAR | Status: DC | PRN
  Administered 2018-07-15: 10 mg via INTRAVENOUS

## 2018-07-15 MED ORDER — ONDANSETRON HCL 4 MG/2ML IJ SOLN
INTRAMUSCULAR | Status: AC
  Administered 2018-07-15: 16:00:00
  Filled 2018-07-15: qty 2

## 2018-07-15 MED ORDER — LIDOCAINE-EPINEPHRINE (PF) 1 %-1:200000 IJ SOLN
INTRAMUSCULAR | Status: AC
  Filled 2018-07-15: qty 30

## 2018-07-15 MED ORDER — NEPRO/CARBSTEADY PO LIQD: 237.0000 mL | Freq: Two times a day (BID) | ORAL | Status: DC

## 2018-07-15 MED ORDER — HEPARIN SODIUM (PORCINE) 1000 UNIT/ML IJ SOLN
INTRAMUSCULAR | Status: DC | PRN
  Administered 2018-07-15: 3000 [IU] via INTRAVENOUS

## 2018-07-15 MED ORDER — ONDANSETRON HCL 4 MG/2ML IJ SOLN
INTRAMUSCULAR | Status: DC | PRN
  Administered 2018-07-15: 4 mg via INTRAVENOUS

## 2018-07-15 MED ORDER — PHENOL 1.4 % MT LIQD: 1.0000 | OROMUCOSAL | Status: DC | PRN

## 2018-07-15 MED ORDER — MIDAZOLAM HCL 2 MG/2ML IJ SOLN
INTRAMUSCULAR | Status: AC
  Filled 2018-07-15: qty 2

## 2018-07-15 MED ORDER — FENTANYL CITRATE (PF) 100 MCG/2ML IJ SOLN: 25.0000 ug | INTRAMUSCULAR | Status: DC | PRN

## 2018-07-15 MED ORDER — FENTANYL CITRATE (PF) 250 MCG/5ML IJ SOLN
INTRAMUSCULAR | Status: DC | PRN
  Administered 2018-07-15 (×4): 50 ug via INTRAVENOUS

## 2018-07-15 MED ORDER — ONDANSETRON HCL 4 MG/2ML IJ SOLN
INTRAMUSCULAR | Status: AC
  Filled 2018-07-15: qty 2

## 2018-07-15 MED ORDER — LANTHANUM CARBONATE 500 MG PO CHEW
1000.0000 mg | CHEWABLE_TABLET | Freq: Two times a day (BID) | ORAL | Status: DC
  Administered 2018-07-15 – 2018-07-16 (×2): 1000 mg via ORAL
  Filled 2018-07-15 (×2): qty 2

## 2018-07-15 MED ORDER — OXYCODONE-ACETAMINOPHEN 5-325 MG PO TABS: 1.0000 | ORAL_TABLET | Freq: Four times a day (QID) | ORAL | Status: DC | PRN

## 2018-07-15 MED ORDER — SODIUM CHLORIDE 0.9 % IV SOLN
INTRAVENOUS | Status: AC
  Filled 2018-07-15: qty 1.2

## 2018-07-15 MED ORDER — SODIUM CHLORIDE 0.9 % IV SOLN
INTRAVENOUS | Status: DC | PRN
  Administered 2018-07-15: 500 mL

## 2018-07-15 SURGICAL SUPPLY — 55 items
ARMBAND PINK RESTRICT EXTREMIT (MISCELLANEOUS) ×3 IMPLANT
BAG DECANTER FOR FLEXI CONT (MISCELLANEOUS) ×3 IMPLANT
BIOPATCH RED 1 DISK 7.0 (GAUZE/BANDAGES/DRESSINGS) ×3 IMPLANT
BNDG ESMARK 4X9 LF (GAUZE/BANDAGES/DRESSINGS) ×3 IMPLANT
CANISTER SUCT 3000ML PPV (MISCELLANEOUS) ×3 IMPLANT
CATH PALINDROME RT-P 15FX19CM (CATHETERS) IMPLANT
CATH PALINDROME RT-P 15FX23CM (CATHETERS) IMPLANT
CATH PALINDROME RT-P 15FX28CM (CATHETERS) IMPLANT
CATH PALINDROME RT-P 15FX55CM (CATHETERS) IMPLANT
CLIP VESOCCLUDE MED 6/CT (CLIP) ×3 IMPLANT
CLIP VESOCCLUDE SM WIDE 6/CT (CLIP) ×3 IMPLANT
COVER PROBE W GEL 5X96 (DRAPES) ×3 IMPLANT
COVER SURGICAL LIGHT HANDLE (MISCELLANEOUS) ×3 IMPLANT
COVER WAND RF STERILE (DRAPES) ×3 IMPLANT
CUFF TOURNIQUET SINGLE 18IN (TOURNIQUET CUFF) ×3 IMPLANT
DERMABOND ADHESIVE PROPEN (GAUZE/BANDAGES/DRESSINGS) ×1
DERMABOND ADVANCED (GAUZE/BANDAGES/DRESSINGS) ×1
DERMABOND ADVANCED .7 DNX12 (GAUZE/BANDAGES/DRESSINGS) ×2 IMPLANT
DERMABOND ADVANCED .7 DNX6 (GAUZE/BANDAGES/DRESSINGS) ×2 IMPLANT
DRAPE C-ARM 42X72 X-RAY (DRAPES) ×3 IMPLANT
DRAPE CHEST BREAST 15X10 FENES (DRAPES) ×3 IMPLANT
DRAPE ORTHO SPLIT 77X108 STRL (DRAPES) ×1
DRAPE SURG ORHT 6 SPLT 77X108 (DRAPES) ×2 IMPLANT
ELECT REM PT RETURN 9FT ADLT (ELECTROSURGICAL) ×3
ELECTRODE REM PT RTRN 9FT ADLT (ELECTROSURGICAL) ×2 IMPLANT
GAUZE 4X4 16PLY RFD (DISPOSABLE) ×3 IMPLANT
GLOVE BIO SURGEON STRL SZ7.5 (GLOVE) ×3 IMPLANT
GOWN STRL REUS W/ TWL LRG LVL3 (GOWN DISPOSABLE) ×4 IMPLANT
GOWN STRL REUS W/ TWL XL LVL3 (GOWN DISPOSABLE) ×2 IMPLANT
GOWN STRL REUS W/TWL LRG LVL3 (GOWN DISPOSABLE) ×2
GOWN STRL REUS W/TWL XL LVL3 (GOWN DISPOSABLE) ×1
KIT BASIN OR (CUSTOM PROCEDURE TRAY) ×3 IMPLANT
KIT TURNOVER KIT B (KITS) ×3 IMPLANT
NEEDLE 18GX1X1/2 (RX/OR ONLY) (NEEDLE) ×3 IMPLANT
NEEDLE HYPO 25GX1X1/2 BEV (NEEDLE) ×3 IMPLANT
NS IRRIG 1000ML POUR BTL (IV SOLUTION) ×3 IMPLANT
PACK CV ACCESS (CUSTOM PROCEDURE TRAY) ×3 IMPLANT
PACK SURGICAL SETUP 50X90 (CUSTOM PROCEDURE TRAY) ×3 IMPLANT
PAD ARMBOARD 7.5X6 YLW CONV (MISCELLANEOUS) ×6 IMPLANT
SOAP 2 % CHG 4 OZ (WOUND CARE) ×3 IMPLANT
SUT ETHILON 3 0 PS 1 (SUTURE) ×3 IMPLANT
SUT MNCRL AB 4-0 PS2 18 (SUTURE) ×3 IMPLANT
SUT PROLENE 5 0 C 1 24 (SUTURE) ×6 IMPLANT
SUT PROLENE 6 0 BV (SUTURE) ×3 IMPLANT
SUT VIC AB 3-0 SH 27 (SUTURE) ×1
SUT VIC AB 3-0 SH 27X BRD (SUTURE) ×2 IMPLANT
SYR 10ML LL (SYRINGE) ×3 IMPLANT
SYR 20CC LL (SYRINGE) ×6 IMPLANT
SYR 5ML LL (SYRINGE) ×3 IMPLANT
SYR CONTROL 10ML LL (SYRINGE) ×3 IMPLANT
TOWEL GREEN STERILE (TOWEL DISPOSABLE) ×3 IMPLANT
TOWEL GREEN STERILE FF (TOWEL DISPOSABLE) ×3 IMPLANT
TOWEL OR 17X26 4PK STRL BLUE (TOWEL DISPOSABLE) ×3 IMPLANT
UNDERPAD 30X30 (UNDERPADS AND DIAPERS) ×3 IMPLANT
WATER STERILE IRR 1000ML POUR (IV SOLUTION) ×3 IMPLANT

## 2018-07-15 NOTE — Anesthesia Postprocedure Evaluation (Signed)
Anesthesia Post Note  Patient: Christopher Wang  Procedure(s) Performed: REVISION OF ARTERIOVENOUS FISTULA LEFT FOREARM (Left Arm Upper)     Patient location during evaluation: PACU Anesthesia Type: General Level of consciousness: awake and alert Pain management: pain level controlled Vital Signs Assessment: post-procedure vital signs reviewed and stable Respiratory status: spontaneous breathing, nonlabored ventilation, respiratory function stable and patient connected to nasal cannula oxygen Cardiovascular status: blood pressure returned to baseline and stable Postop Assessment: no apparent nausea or vomiting Anesthetic complications: no    Last Vitals:  Vitals:   07/15/18 1515 07/15/18 1536  BP: 116/88 125/89  Pulse: 77 74  Resp: 12 10  Temp: (!) 36.4 C (!) 36.4 C  SpO2: 100% 100%    Last Pain:  Vitals:   07/15/18 1538  TempSrc:   PainSc: 0-No pain                 Asmi Fugere COKER

## 2018-07-15 NOTE — Anesthesia Procedure Notes (Addendum)
Procedure Name: LMA Insertion Date/Time: 07/15/2018 1:13 PM Performed by: Bryson Corona, CRNA Pre-anesthesia Checklist: Patient identified, Emergency Drugs available, Suction available and Patient being monitored Patient Re-evaluated:Patient Re-evaluated prior to induction Oxygen Delivery Method: Circle System Utilized Preoxygenation: Pre-oxygenation with 100% oxygen Induction Type: IV induction Ventilation: Mask ventilation without difficulty LMA: LMA inserted LMA Size: 4.0 Number of attempts: 1 Placement Confirmation: positive ETCO2 Tube secured with: Tape Dental Injury: Teeth and Oropharynx as per pre-operative assessment

## 2018-07-15 NOTE — Telephone Encounter (Signed)
-----   Message from Mena Goes, RN sent at 07/15/2018  2:18 PM EDT ----- Regarding: 2-3 weeks with PA clinic   ----- Message ----- From: Natividad Brood Sent: 07/15/2018   2:05 PM EDT To: [facility] Admin Pool, [facility] Charge Pool  S/p revision of left arm fistula 07/15/18.  F/u with PA clinic on Dr. Claretha Cooper office day in a couple of weeks.  Thanks

## 2018-07-15 NOTE — Transfer of Care (Signed)
Immediate Anesthesia Transfer of Care Note  Patient: [name]  Procedure(s) Performed: REVISION OF ARTERIOVENOUS FISTULA LEFT FOREARM (Left Arm Upper)  Patient Location: PACU  Anesthesia Type:General  Level of Consciousness: drowsy  Airway & Oxygen Therapy: Patient Spontanous Breathing and Patient connected to face mask oxygen  Post-op Assessment: Report given to RN and Post -op Vital signs reviewed and stable  Post vital signs: Reviewed and stable  Last Vitals:  Vitals Value Taken Time  BP    Temp    Pulse    Resp    SpO2      Last Pain:  Vitals:   07/15/18 1415  TempSrc:   PainSc: (P) Asleep      Patients Stated Pain Goal: 2 (18/33/58 2518)  Complications: No apparent anesthesia complications

## 2018-07-15 NOTE — Progress Notes (Signed)
Dr. Donzetta Matters and Dr. Linna Caprice notified that pt. Does not have a ride home and does not have anyone to stay with him for 24 hours.  Explained to pt. The reason he cannot stay alone or ride home alone. If questioned further he will say he has a ride home but is unable to provide a name or a number for the person who will take him home.

## 2018-07-15 NOTE — Op Note (Signed)
    Patient name: Christopher Wang MRN: [id number] DOB: 15-Feb-1986 Sex: male  07/15/2018 Pre-operative Diagnosis: esrd, ulceration of left arm avf Post-operative diagnosis:  Same Surgeon:  Eda Paschal. Donzetta Matters, MD Assistant: Leontine Locket, PA Procedure Performed: Revision of left arm AV fistula with plication of pseudoaneurysm  Indications: 32 year old male with end-stage renal disease on dialysis via left arm fistula.  He now has an area of ulceration over the pseudoaneurysm and is indicated for revision and possible tunneled catheter placement.  Findings: Fistula had a large pseudoaneurysm that was densely adherent to the skin.  After plication there is strong thrill throughout the fistula with adequate area for cannulation and the catheter was not placed.   Procedure:  The patient was identified in the holding area and taken to the operating room where general anesthesia was induced.  He was sterilely prepped and draped in left upper extremity usual fashion antibiotics were administered and a timeout was called.  We began with administering 3000 units of heparin.  The left upper extremity was then exsanguinated with Esmarch tourniquet was inflated 250 mmHg above the elbow.  The area of concern was then excised with an elliptical type incision.  Then dissected out the fistula around there.  Fistula was closed with a running mattress type suture prior to the completion I did irrigate with heparinized saline.  After I completed this over the tourniquet down it did have strong bleeding throughout the fistula.  And obtain hemostasis reversed heparin with 50 mg of protamine.  Skin was closed with 4-0 Monocryl.  He was allowed away from anesthesia having tolerated procedure well without immediate complication.  All counts were correct at completion  EBL: 50cc     Patton Swisher C. Donzetta Matters, MD [facility] [place] Office: [phone] Pager: [phone]

## 2018-07-15 NOTE — H&P (Signed)
   History and Physical Update  The patient was interviewed and re-examined.  The patient's previous History and Physical has been reviewed and is unchanged from recent office visit. Plan for revision of left arm avf and possible tdc placement.  Robin Petrakis C. Donzetta Matters, MD [facility] [place] Office: [phone] Pager: [phone]   07/15/2018, 10:27 AM

## 2018-07-15 NOTE — Discharge Instructions (Signed)
[facility]  Discharge Instructions  AV Fistula or Graft Surgery for Dialysis Access  Please refer to the following instructions for your post-procedure care. Your surgeon or physician assistant will discuss any changes with you.  Activity  You may drive the day following your surgery, if you are comfortable and no longer taking prescription pain medication. Resume full activity as the soreness in your incision resolves.  Bathing/Showering  You may shower after you go home. Keep your incision dry for 48 hours. Do not soak in a bathtub, hot tub, or swim until the incision heals completely. You may not shower if you have a hemodialysis catheter.  Incision Care  Clean your incision with mild soap and water after 48 hours. Pat the area dry with a clean towel. You do not need a bandage unless otherwise instructed. Do not apply any ointments or creams to your incision. You may have skin glue on your incision. Do not peel it off. It will come off on its own in about one week. Your arm may swell a bit after surgery. To reduce swelling use pillows to elevate your arm so it is above your heart. Your doctor will tell you if you need to lightly wrap your arm with an ACE bandage.  Diet  Resume your normal diet. There are not special food restrictions following this procedure. In order to heal from your surgery, it is CRITICAL to get adequate nutrition. Your body requires vitamins, minerals, and protein. Vegetables are the best source of vitamins and minerals. Vegetables also provide the perfect balance of protein. Processed food has little nutritional value, so try to avoid this.  Medications  Resume taking all of your medications. If your incision is causing pain, you may take over-the counter pain relievers such as acetaminophen (Tylenol). If you were prescribed a stronger pain medication, please be aware these medications can cause nausea and constipation. Prevent  nausea by taking the medication with a snack or meal. Avoid constipation by drinking plenty of fluids and eating foods with high amount of fiber, such as fruits, vegetables, and grains.  Do not take Tylenol if you are taking prescription pain medications.  Follow up Your surgeon may want to see you in the office following your access surgery. If so, this will be arranged at the time of your surgery.  Please call us immediately for any of the following conditions:  Increased pain, redness, drainage (pus) from your incision site Fever of 101 degrees or higher Severe or worsening pain at your incision site Hand pain or numbness.  Reduce your risk of vascular disease:  Stop smoking. If you would like help, call [facility] at 1-800-QUIT-NOW 940-701-9939) or [place] at [place] your cholesterol Maintain a desired weight Control your diabetes Keep your blood pressure down  Dialysis  It will take several weeks to several months for your new dialysis access to be ready for use. Your surgeon will determine when it is okay to use it. Your nephrologist will continue to direct your dialysis. You can continue to use your Permcath until your new access is ready for use.   07/15/2018 Christopher Wang [id number] 11/17/1985  Surgeon(s): Waynetta Sandy, MD  Procedure(s): REVISION OF ARTERIOVENOUS FISTULA LEFT FOREARM   x May stick graft on designated area only:  May stick above and below incision now. Do NOT stick over incision for 4 weeks. SEE DIAGRAM.    If you have any questions, please call the  office at [phone].

## 2018-07-15 NOTE — Anesthesia Preprocedure Evaluation (Signed)
Anesthesia Evaluation  Patient identified by MRN, date of birth, ID band Patient awake    Reviewed: Allergy & Precautions, NPO status , Patient's Chart, lab work & pertinent test results  Airway Mallampati: II  TM Distance: >3 FB Neck ROM: Full    Dental  (+) Teeth Intact, Dental Advisory Given   Pulmonary    breath sounds clear to auscultation       Cardiovascular hypertension,  Rhythm:Regular Rate:Normal     Neuro/Psych    GI/Hepatic   Endo/Other    Renal/GU      Musculoskeletal   Abdominal   Peds  Hematology   Anesthesia Other Findings   Reproductive/Obstetrics                             Anesthesia Physical Anesthesia Plan  ASA: III  Anesthesia Plan: General   Post-op Pain Management:    Induction: Intravenous  PONV Risk Score and Plan: Ondansetron and Dexamethasone  Airway Management Planned: LMA  Additional Equipment:   Intra-op Plan:   Post-operative Plan: Extubation in OR  Informed Consent: I have reviewed the patients History and Physical, chart, labs and discussed the procedure including the risks, benefits and alternatives for the proposed anesthesia with the patient or authorized representative who has indicated his/her understanding and acceptance.   Dental advisory given  Plan Discussed with: CRNA and Anesthesiologist  Anesthesia Plan Comments:         Anesthesia Quick Evaluation

## 2018-07-15 NOTE — Progress Notes (Signed)
Patient to 4E room 24 at this time. Telemetry applied and CCMD notified. V/s and assessment done. Patient oriented to room and how to call nurse with any needs.  Emelda Fear, RN

## 2018-07-15 NOTE — Telephone Encounter (Signed)
sch appt spk to pt 08/06/18 3pm p/o PA

## 2018-07-15 NOTE — Progress Notes (Signed)
Patient wanting to be D.C. before 0:700 in the Morning to go for his Dialysis On [address] Or  If For Dialysis her he wants it Schedule no later than 07:40 . He has to go to work. Page Dr. Carlis Abbott he is in the O.R. Call was return by Clarene Critchley in the O.R. And she will pass it on to Dr. Carlis Abbott. Charge R.N. Aware.

## 2018-07-16 ENCOUNTER — Encounter (HOSPITAL_COMMUNITY): Payer: Self-pay | Admitting: Vascular Surgery

## 2018-07-16 DIAGNOSIS — T82898A Other specified complication of vascular prosthetic devices, implants and grafts, initial encounter: Secondary | ICD-10-CM | POA: Diagnosis not present

## 2018-07-16 DIAGNOSIS — Z992 Dependence on renal dialysis: Secondary | ICD-10-CM | POA: Diagnosis not present

## 2018-07-16 DIAGNOSIS — D631 Anemia in chronic kidney disease: Secondary | ICD-10-CM | POA: Diagnosis not present

## 2018-07-16 DIAGNOSIS — N2581 Secondary hyperparathyroidism of renal origin: Secondary | ICD-10-CM | POA: Diagnosis not present

## 2018-07-16 DIAGNOSIS — Z8249 Family history of ischemic heart disease and other diseases of the circulatory system: Secondary | ICD-10-CM | POA: Diagnosis not present

## 2018-07-16 DIAGNOSIS — I120 Hypertensive chronic kidney disease with stage 5 chronic kidney disease or end stage renal disease: Secondary | ICD-10-CM | POA: Diagnosis not present

## 2018-07-16 DIAGNOSIS — Z79899 Other long term (current) drug therapy: Secondary | ICD-10-CM | POA: Diagnosis not present

## 2018-07-16 DIAGNOSIS — D509 Iron deficiency anemia, unspecified: Secondary | ICD-10-CM | POA: Diagnosis not present

## 2018-07-16 DIAGNOSIS — N186 End stage renal disease: Secondary | ICD-10-CM | POA: Diagnosis not present

## 2018-07-16 DIAGNOSIS — Z23 Encounter for immunization: Secondary | ICD-10-CM | POA: Diagnosis not present

## 2018-07-16 LAB — HIV ANTIBODY (ROUTINE TESTING W REFLEX): HIV Screen 4th Generation wRfx: NONREACTIVE

## 2018-07-16 NOTE — Progress Notes (Signed)
Patient discharged.  AVS given to patient and discharge instructions including appointment, medications, and post-surgical care review.  Patient acknowledged understanding.  All questions addressed.  IV removed, CCMD notified, vital signs were obtained and were stable.

## 2018-07-16 NOTE — Discharge Summary (Signed)
Physician Discharge Summary   Patient ID: Christopher Wang [id number] 32 y.o. 11/21/1985  Admit date: 07/15/2018  Discharge date and time: 07/16/2018  9:27 AM   Admitting Physician: Waynetta Sandy, MD   Discharge Physician: same  Admission Diagnoses: false aneurysm left forearm  Discharge Diagnoses: same  Admission Condition: fair  Discharged Condition: fair  Indication for Admission: pseudoaneurysm of L AV fistula  Hospital Course: Mr. Frampton is a 32 year old male who came in as an outpatient for revision of left arm AV fistula with plication of pseudoaneurysm by Dr. Donzetta Matters on 07/15/2018.  He tolerated the procedure well and was admitted overnight.  POD #1 incision of left arm is clean dry and intact and he has a palpable thrill and audible bruit throughout the length of his AV fistula.  He will have dialysis at his outpatient kidney center this morning.  He was prescribed 1 to 2 days of narcotic pain medication.  There is no need to follow-up with Dr. Donzetta Matters unless they continue to have trouble during hemodialysis.  Discharge instructions were reviewed with the patient and he voices his understanding.  He was discharged this morning in stable condition.  Consults: None  Treatments: surgery: revision of left arm fistula with plication of pseudoaneurysm  Discharge Exam: see progress note 07/16/18 Vitals:   07/16/18 0333 07/16/18 0831  BP: 121/80 111/77  Pulse:  (!) 103  Resp: 12 14  Temp: 98.3 F (36.8 C) 98.3 F (36.8 C)  SpO2: 96% 96%    Disposition: Discharge disposition: 01-Home or Self Care       Patient Instructions:  Allergies as of 07/16/2018   No Known Allergies     Medication List    TAKE these medications   FOSRENOL 1000 MG Pack Generic drug:  Lanthanum Carbonate Take 1,000 mg by mouth 2 (two) times daily with a meal.   oxyCODONE-acetaminophen 5-325 MG tablet Commonly known as:  PERCOCET/ROXICET Take 1 tablet by mouth every 6 (six) hours  as needed for severe pain.   promethazine 12.5 MG tablet Commonly known as:  PHENERGAN Take 12.5 mg by mouth every other day.      Activity: activity as tolerated Diet: regular diet Wound Care: keep wound clean and dry  No follow-up needed unless having trouble with fistula during dialysis  Signed: Dagoberto Ligas 07/16/2018 10:30 AM

## 2018-07-16 NOTE — Progress Notes (Signed)
  Progress Note    07/16/2018 8:19 AM 1 Day Post-Op  Subjective: No complaints today  Vitals:   07/15/18 2341 07/16/18 0333  BP: (!) 100/56 121/80  Pulse: 95   Resp: 15 12  Temp: 98 F (36.7 C) 98.3 F (36.8 C)  SpO2: 97% 96%    Physical Exam: Awake alert oriented Incision on left arm well-healing is clean dry and intact with Dermabond in place There is a strong thrill in the fistula   CBC    Component Value Date/Time   WBC 7.0 06/05/2018 1918   RBC 3.37 (L) 06/05/2018 1918   HGB 14.6 07/15/2018 0843   HCT 43.0 07/15/2018 0843   PLT 230 06/05/2018 1918   MCV 96.1 06/05/2018 1918   MCH 29.7 06/05/2018 1918   MCHC 30.9 06/05/2018 1918   RDW 15.7 (H) 06/05/2018 1918   LYMPHSABS 0.8 06/21/2014 1703   MONOABS 0.3 06/21/2014 1703   EOSABS 0.0 06/21/2014 1703   BASOSABS 0.0 06/21/2014 1703    BMET    Component Value Date/Time   NA 136 07/15/2018 0843   K 4.0 07/15/2018 0843   CL 83 (L) 06/05/2018 1918   CO2 30 06/05/2018 1918   GLUCOSE 78 07/15/2018 0843   BUN 45 (H) 06/05/2018 1918   CREATININE 10.32 (H) 06/05/2018 1918   CALCIUM 9.5 06/05/2018 1918   GFRNONAA 6 (L) 06/05/2018 1918   GFRAA 7 (L) 06/05/2018 1918    INR    Component Value Date/Time   INR 1.10 08/03/2013 0810     Intake/Output Summary (Last 24 hours) at 07/16/2018 0819 Last data filed at 07/15/2018 2010 Gross per 24 hour  Intake 1564.33 ml  Output 50 ml  Net 1514.33 ml     Assessment:  32 y.o. male is s/p revision left arm AV fistula with plans for dialysis today  Plan: Will discharge today He can shower as needed beginning today Okay for dialysis via fistula and avoid sticking the operative site   [place] C. Donzetta Matters, MD [facility] [place] Office: [phone] Pager: [phone]  07/16/2018 8:19 AM

## 2018-07-16 NOTE — Progress Notes (Signed)
Dr Carlis Abbott called and made aware that patient is ready to leave. Patient was inform that he would not be D.C. Before 07:00 and to be patient and wait for P.A.'s and M.D. To come in this A.M. Charge Nurse and I went into room and explain to patient that he needed to wait til they get here to D.C.

## 2018-07-19 DIAGNOSIS — D631 Anemia in chronic kidney disease: Secondary | ICD-10-CM | POA: Diagnosis not present

## 2018-07-19 DIAGNOSIS — Z23 Encounter for immunization: Secondary | ICD-10-CM | POA: Diagnosis not present

## 2018-07-19 DIAGNOSIS — D509 Iron deficiency anemia, unspecified: Secondary | ICD-10-CM | POA: Diagnosis not present

## 2018-07-19 DIAGNOSIS — N2581 Secondary hyperparathyroidism of renal origin: Secondary | ICD-10-CM | POA: Diagnosis not present

## 2018-07-19 DIAGNOSIS — N186 End stage renal disease: Secondary | ICD-10-CM | POA: Diagnosis not present

## 2018-07-21 DIAGNOSIS — Z23 Encounter for immunization: Secondary | ICD-10-CM | POA: Diagnosis not present

## 2018-07-21 DIAGNOSIS — N186 End stage renal disease: Secondary | ICD-10-CM | POA: Diagnosis not present

## 2018-07-21 DIAGNOSIS — N2581 Secondary hyperparathyroidism of renal origin: Secondary | ICD-10-CM | POA: Diagnosis not present

## 2018-07-21 DIAGNOSIS — D631 Anemia in chronic kidney disease: Secondary | ICD-10-CM | POA: Diagnosis not present

## 2018-07-21 DIAGNOSIS — D509 Iron deficiency anemia, unspecified: Secondary | ICD-10-CM | POA: Diagnosis not present

## 2018-07-23 DIAGNOSIS — D631 Anemia in chronic kidney disease: Secondary | ICD-10-CM | POA: Diagnosis not present

## 2018-07-23 DIAGNOSIS — Z23 Encounter for immunization: Secondary | ICD-10-CM | POA: Diagnosis not present

## 2018-07-23 DIAGNOSIS — N2581 Secondary hyperparathyroidism of renal origin: Secondary | ICD-10-CM | POA: Diagnosis not present

## 2018-07-23 DIAGNOSIS — D509 Iron deficiency anemia, unspecified: Secondary | ICD-10-CM | POA: Diagnosis not present

## 2018-07-23 DIAGNOSIS — N186 End stage renal disease: Secondary | ICD-10-CM | POA: Diagnosis not present

## 2018-07-26 DIAGNOSIS — N2581 Secondary hyperparathyroidism of renal origin: Secondary | ICD-10-CM | POA: Diagnosis not present

## 2018-07-26 DIAGNOSIS — N186 End stage renal disease: Secondary | ICD-10-CM | POA: Diagnosis not present

## 2018-07-26 DIAGNOSIS — D509 Iron deficiency anemia, unspecified: Secondary | ICD-10-CM | POA: Diagnosis not present

## 2018-07-26 DIAGNOSIS — Z23 Encounter for immunization: Secondary | ICD-10-CM | POA: Diagnosis not present

## 2018-07-26 DIAGNOSIS — D631 Anemia in chronic kidney disease: Secondary | ICD-10-CM | POA: Diagnosis not present

## 2018-07-28 DIAGNOSIS — N186 End stage renal disease: Secondary | ICD-10-CM | POA: Diagnosis not present

## 2018-07-28 DIAGNOSIS — D509 Iron deficiency anemia, unspecified: Secondary | ICD-10-CM | POA: Diagnosis not present

## 2018-07-28 DIAGNOSIS — D631 Anemia in chronic kidney disease: Secondary | ICD-10-CM | POA: Diagnosis not present

## 2018-07-28 DIAGNOSIS — N2581 Secondary hyperparathyroidism of renal origin: Secondary | ICD-10-CM | POA: Diagnosis not present

## 2018-07-28 DIAGNOSIS — Z23 Encounter for immunization: Secondary | ICD-10-CM | POA: Diagnosis not present

## 2018-07-30 DIAGNOSIS — N033 Chronic nephritic syndrome with diffuse mesangial proliferative glomerulonephritis: Secondary | ICD-10-CM | POA: Diagnosis not present

## 2018-07-30 DIAGNOSIS — N2581 Secondary hyperparathyroidism of renal origin: Secondary | ICD-10-CM | POA: Diagnosis not present

## 2018-07-30 DIAGNOSIS — N186 End stage renal disease: Secondary | ICD-10-CM | POA: Diagnosis not present

## 2018-07-30 DIAGNOSIS — D509 Iron deficiency anemia, unspecified: Secondary | ICD-10-CM | POA: Diagnosis not present

## 2018-07-30 DIAGNOSIS — Z992 Dependence on renal dialysis: Secondary | ICD-10-CM | POA: Diagnosis not present

## 2018-08-02 DIAGNOSIS — N186 End stage renal disease: Secondary | ICD-10-CM | POA: Diagnosis not present

## 2018-08-02 DIAGNOSIS — N2581 Secondary hyperparathyroidism of renal origin: Secondary | ICD-10-CM | POA: Diagnosis not present

## 2018-08-02 DIAGNOSIS — D509 Iron deficiency anemia, unspecified: Secondary | ICD-10-CM | POA: Diagnosis not present

## 2018-08-04 DIAGNOSIS — N2581 Secondary hyperparathyroidism of renal origin: Secondary | ICD-10-CM | POA: Diagnosis not present

## 2018-08-04 DIAGNOSIS — N186 End stage renal disease: Secondary | ICD-10-CM | POA: Diagnosis not present

## 2018-08-04 DIAGNOSIS — D509 Iron deficiency anemia, unspecified: Secondary | ICD-10-CM | POA: Diagnosis not present

## 2018-08-06 ENCOUNTER — Encounter: Payer: Self-pay | Admitting: Physician Assistant

## 2018-08-06 ENCOUNTER — Other Ambulatory Visit: Payer: Self-pay

## 2018-08-06 ENCOUNTER — Ambulatory Visit (INDEPENDENT_AMBULATORY_CARE_PROVIDER_SITE_OTHER): Payer: Self-pay | Admitting: Physician Assistant

## 2018-08-06 VITALS — BP 119/86 | HR 83 | Temp 97.9°F | Resp 16 | Ht 72.0 in | Wt 145.0 lb

## 2018-08-06 DIAGNOSIS — N2581 Secondary hyperparathyroidism of renal origin: Secondary | ICD-10-CM | POA: Diagnosis not present

## 2018-08-06 DIAGNOSIS — N186 End stage renal disease: Secondary | ICD-10-CM

## 2018-08-06 DIAGNOSIS — D509 Iron deficiency anemia, unspecified: Secondary | ICD-10-CM | POA: Diagnosis not present

## 2018-08-06 NOTE — Progress Notes (Signed)
    Postoperative Access Visit   History of Present Illness   Christopher Wang is a 32 y.o. year old male who presents for postoperative follow-up for: left arm radiocephalic fistula plication by Dr. Donzetta Matters (Date: 07/15/18).  The patient's wounds are healed.  The patient notes denies steal symptoms.  The patient is able to complete their activities of daily living.  He is dialyzing on a Monday Wednesday Friday schedule under the care of Dr. Freddrick March.  He has not had any trouble completing full hemodialysis treatments since plication surgery.  Patient states he plans to be more involved in work-up for kidney transplantation in the future.   Physical Examination   Vitals:   08/06/18 1431  BP: 119/86  Pulse: 83  Resp: 16  Temp: 97.9 F (36.6 C)  TempSrc: Oral  SpO2: 100%  Weight: 145 lb (65.8 kg)  Height: 6' (1.829 m)   Body mass index is 19.67 kg/m.  left arm Incision is healed, hand grip is 5/5, sensation in digits is intact, palpable thrill near anastomosis this becomes more difficult to feel in mid forearm however soft thrill palpable near [facility] fossa, bruit can be auscultated    Medical Decision Making   Christopher Wang is a 32 y.o. year old male who presents s/p plication of left radiocephalic fistula due to ulceration   Well-healed incision of left forearm  Recommend avoiding cannulating area of plication incision for another week for a total of 4 weeks  He may follow-up on an as-needed basis   Dagoberto Ligas PA-C [facility] Office: [phone]

## 2018-08-09 DIAGNOSIS — N186 End stage renal disease: Secondary | ICD-10-CM | POA: Diagnosis not present

## 2018-08-09 DIAGNOSIS — N2581 Secondary hyperparathyroidism of renal origin: Secondary | ICD-10-CM | POA: Diagnosis not present

## 2018-08-09 DIAGNOSIS — D509 Iron deficiency anemia, unspecified: Secondary | ICD-10-CM | POA: Diagnosis not present

## 2018-08-11 DIAGNOSIS — N2581 Secondary hyperparathyroidism of renal origin: Secondary | ICD-10-CM | POA: Diagnosis not present

## 2018-08-11 DIAGNOSIS — D509 Iron deficiency anemia, unspecified: Secondary | ICD-10-CM | POA: Diagnosis not present

## 2018-08-11 DIAGNOSIS — N186 End stage renal disease: Secondary | ICD-10-CM | POA: Diagnosis not present

## 2018-08-13 DIAGNOSIS — N2581 Secondary hyperparathyroidism of renal origin: Secondary | ICD-10-CM | POA: Diagnosis not present

## 2018-08-13 DIAGNOSIS — D509 Iron deficiency anemia, unspecified: Secondary | ICD-10-CM | POA: Diagnosis not present

## 2018-08-13 DIAGNOSIS — N186 End stage renal disease: Secondary | ICD-10-CM | POA: Diagnosis not present

## 2018-08-16 DIAGNOSIS — N186 End stage renal disease: Secondary | ICD-10-CM | POA: Diagnosis not present

## 2018-08-16 DIAGNOSIS — D509 Iron deficiency anemia, unspecified: Secondary | ICD-10-CM | POA: Diagnosis not present

## 2018-08-16 DIAGNOSIS — N2581 Secondary hyperparathyroidism of renal origin: Secondary | ICD-10-CM | POA: Diagnosis not present

## 2018-08-18 DIAGNOSIS — D509 Iron deficiency anemia, unspecified: Secondary | ICD-10-CM | POA: Diagnosis not present

## 2018-08-18 DIAGNOSIS — N186 End stage renal disease: Secondary | ICD-10-CM | POA: Diagnosis not present

## 2018-08-18 DIAGNOSIS — N2581 Secondary hyperparathyroidism of renal origin: Secondary | ICD-10-CM | POA: Diagnosis not present

## 2018-08-19 DIAGNOSIS — N2581 Secondary hyperparathyroidism of renal origin: Secondary | ICD-10-CM | POA: Diagnosis not present

## 2018-08-19 DIAGNOSIS — D509 Iron deficiency anemia, unspecified: Secondary | ICD-10-CM | POA: Diagnosis not present

## 2018-08-19 DIAGNOSIS — N186 End stage renal disease: Secondary | ICD-10-CM | POA: Diagnosis not present

## 2018-08-22 DIAGNOSIS — N2581 Secondary hyperparathyroidism of renal origin: Secondary | ICD-10-CM | POA: Diagnosis not present

## 2018-08-22 DIAGNOSIS — N186 End stage renal disease: Secondary | ICD-10-CM | POA: Diagnosis not present

## 2018-08-22 DIAGNOSIS — D509 Iron deficiency anemia, unspecified: Secondary | ICD-10-CM | POA: Diagnosis not present

## 2018-08-24 DIAGNOSIS — H6123 Impacted cerumen, bilateral: Secondary | ICD-10-CM | POA: Diagnosis not present

## 2018-08-24 DIAGNOSIS — N2581 Secondary hyperparathyroidism of renal origin: Secondary | ICD-10-CM | POA: Diagnosis not present

## 2018-08-24 DIAGNOSIS — D509 Iron deficiency anemia, unspecified: Secondary | ICD-10-CM | POA: Diagnosis not present

## 2018-08-24 DIAGNOSIS — N186 End stage renal disease: Secondary | ICD-10-CM | POA: Diagnosis not present

## 2018-08-27 DIAGNOSIS — N186 End stage renal disease: Secondary | ICD-10-CM | POA: Diagnosis not present

## 2018-08-27 DIAGNOSIS — N2581 Secondary hyperparathyroidism of renal origin: Secondary | ICD-10-CM | POA: Diagnosis not present

## 2018-08-27 DIAGNOSIS — D509 Iron deficiency anemia, unspecified: Secondary | ICD-10-CM | POA: Diagnosis not present

## 2018-08-29 DIAGNOSIS — Z992 Dependence on renal dialysis: Secondary | ICD-10-CM | POA: Diagnosis not present

## 2018-08-29 DIAGNOSIS — N186 End stage renal disease: Secondary | ICD-10-CM | POA: Diagnosis not present

## 2018-08-29 DIAGNOSIS — N033 Chronic nephritic syndrome with diffuse mesangial proliferative glomerulonephritis: Secondary | ICD-10-CM | POA: Diagnosis not present

## 2018-08-30 DIAGNOSIS — D631 Anemia in chronic kidney disease: Secondary | ICD-10-CM | POA: Diagnosis not present

## 2018-08-30 DIAGNOSIS — N2581 Secondary hyperparathyroidism of renal origin: Secondary | ICD-10-CM | POA: Diagnosis not present

## 2018-08-30 DIAGNOSIS — N186 End stage renal disease: Secondary | ICD-10-CM | POA: Diagnosis not present

## 2018-08-30 DIAGNOSIS — D509 Iron deficiency anemia, unspecified: Secondary | ICD-10-CM | POA: Diagnosis not present

## 2018-09-01 DIAGNOSIS — D631 Anemia in chronic kidney disease: Secondary | ICD-10-CM | POA: Diagnosis not present

## 2018-09-01 DIAGNOSIS — N2581 Secondary hyperparathyroidism of renal origin: Secondary | ICD-10-CM | POA: Diagnosis not present

## 2018-09-01 DIAGNOSIS — N186 End stage renal disease: Secondary | ICD-10-CM | POA: Diagnosis not present

## 2018-09-01 DIAGNOSIS — D509 Iron deficiency anemia, unspecified: Secondary | ICD-10-CM | POA: Diagnosis not present

## 2018-09-03 DIAGNOSIS — D631 Anemia in chronic kidney disease: Secondary | ICD-10-CM | POA: Diagnosis not present

## 2018-09-03 DIAGNOSIS — N2581 Secondary hyperparathyroidism of renal origin: Secondary | ICD-10-CM | POA: Diagnosis not present

## 2018-09-03 DIAGNOSIS — N186 End stage renal disease: Secondary | ICD-10-CM | POA: Diagnosis not present

## 2018-09-03 DIAGNOSIS — D509 Iron deficiency anemia, unspecified: Secondary | ICD-10-CM | POA: Diagnosis not present

## 2018-09-06 DIAGNOSIS — N2581 Secondary hyperparathyroidism of renal origin: Secondary | ICD-10-CM | POA: Diagnosis not present

## 2018-09-06 DIAGNOSIS — D509 Iron deficiency anemia, unspecified: Secondary | ICD-10-CM | POA: Diagnosis not present

## 2018-09-06 DIAGNOSIS — D631 Anemia in chronic kidney disease: Secondary | ICD-10-CM | POA: Diagnosis not present

## 2018-09-06 DIAGNOSIS — N186 End stage renal disease: Secondary | ICD-10-CM | POA: Diagnosis not present

## 2018-09-08 DIAGNOSIS — N186 End stage renal disease: Secondary | ICD-10-CM | POA: Diagnosis not present

## 2018-09-08 DIAGNOSIS — D631 Anemia in chronic kidney disease: Secondary | ICD-10-CM | POA: Diagnosis not present

## 2018-09-08 DIAGNOSIS — D509 Iron deficiency anemia, unspecified: Secondary | ICD-10-CM | POA: Diagnosis not present

## 2018-09-08 DIAGNOSIS — N2581 Secondary hyperparathyroidism of renal origin: Secondary | ICD-10-CM | POA: Diagnosis not present

## 2018-09-10 DIAGNOSIS — N2581 Secondary hyperparathyroidism of renal origin: Secondary | ICD-10-CM | POA: Diagnosis not present

## 2018-09-10 DIAGNOSIS — D509 Iron deficiency anemia, unspecified: Secondary | ICD-10-CM | POA: Diagnosis not present

## 2018-09-10 DIAGNOSIS — N186 End stage renal disease: Secondary | ICD-10-CM | POA: Diagnosis not present

## 2018-09-10 DIAGNOSIS — D631 Anemia in chronic kidney disease: Secondary | ICD-10-CM | POA: Diagnosis not present

## 2018-09-13 DIAGNOSIS — N186 End stage renal disease: Secondary | ICD-10-CM | POA: Diagnosis not present

## 2018-09-13 DIAGNOSIS — D509 Iron deficiency anemia, unspecified: Secondary | ICD-10-CM | POA: Diagnosis not present

## 2018-09-13 DIAGNOSIS — N2581 Secondary hyperparathyroidism of renal origin: Secondary | ICD-10-CM | POA: Diagnosis not present

## 2018-09-13 DIAGNOSIS — D631 Anemia in chronic kidney disease: Secondary | ICD-10-CM | POA: Diagnosis not present

## 2018-09-15 DIAGNOSIS — D509 Iron deficiency anemia, unspecified: Secondary | ICD-10-CM | POA: Diagnosis not present

## 2018-09-15 DIAGNOSIS — D631 Anemia in chronic kidney disease: Secondary | ICD-10-CM | POA: Diagnosis not present

## 2018-09-15 DIAGNOSIS — N2581 Secondary hyperparathyroidism of renal origin: Secondary | ICD-10-CM | POA: Diagnosis not present

## 2018-09-15 DIAGNOSIS — N186 End stage renal disease: Secondary | ICD-10-CM | POA: Diagnosis not present

## 2018-09-17 DIAGNOSIS — N186 End stage renal disease: Secondary | ICD-10-CM | POA: Diagnosis not present

## 2018-09-17 DIAGNOSIS — D509 Iron deficiency anemia, unspecified: Secondary | ICD-10-CM | POA: Diagnosis not present

## 2018-09-17 DIAGNOSIS — D631 Anemia in chronic kidney disease: Secondary | ICD-10-CM | POA: Diagnosis not present

## 2018-09-17 DIAGNOSIS — N2581 Secondary hyperparathyroidism of renal origin: Secondary | ICD-10-CM | POA: Diagnosis not present

## 2018-09-19 DIAGNOSIS — N2581 Secondary hyperparathyroidism of renal origin: Secondary | ICD-10-CM | POA: Diagnosis not present

## 2018-09-19 DIAGNOSIS — D509 Iron deficiency anemia, unspecified: Secondary | ICD-10-CM | POA: Diagnosis not present

## 2018-09-19 DIAGNOSIS — N186 End stage renal disease: Secondary | ICD-10-CM | POA: Diagnosis not present

## 2018-09-19 DIAGNOSIS — D631 Anemia in chronic kidney disease: Secondary | ICD-10-CM | POA: Diagnosis not present

## 2018-09-21 DIAGNOSIS — N2581 Secondary hyperparathyroidism of renal origin: Secondary | ICD-10-CM | POA: Diagnosis not present

## 2018-09-21 DIAGNOSIS — D631 Anemia in chronic kidney disease: Secondary | ICD-10-CM | POA: Diagnosis not present

## 2018-09-21 DIAGNOSIS — N186 End stage renal disease: Secondary | ICD-10-CM | POA: Diagnosis not present

## 2018-09-21 DIAGNOSIS — D509 Iron deficiency anemia, unspecified: Secondary | ICD-10-CM | POA: Diagnosis not present

## 2018-09-24 DIAGNOSIS — N2581 Secondary hyperparathyroidism of renal origin: Secondary | ICD-10-CM | POA: Diagnosis not present

## 2018-09-24 DIAGNOSIS — D509 Iron deficiency anemia, unspecified: Secondary | ICD-10-CM | POA: Diagnosis not present

## 2018-09-24 DIAGNOSIS — D631 Anemia in chronic kidney disease: Secondary | ICD-10-CM | POA: Diagnosis not present

## 2018-09-24 DIAGNOSIS — N186 End stage renal disease: Secondary | ICD-10-CM | POA: Diagnosis not present

## 2018-09-25 DIAGNOSIS — D509 Iron deficiency anemia, unspecified: Secondary | ICD-10-CM | POA: Diagnosis not present

## 2018-09-25 DIAGNOSIS — N186 End stage renal disease: Secondary | ICD-10-CM | POA: Diagnosis not present

## 2018-09-25 DIAGNOSIS — D631 Anemia in chronic kidney disease: Secondary | ICD-10-CM | POA: Diagnosis not present

## 2018-09-25 DIAGNOSIS — N2581 Secondary hyperparathyroidism of renal origin: Secondary | ICD-10-CM | POA: Diagnosis not present

## 2018-09-28 DIAGNOSIS — N186 End stage renal disease: Secondary | ICD-10-CM | POA: Diagnosis not present

## 2018-09-28 DIAGNOSIS — N2581 Secondary hyperparathyroidism of renal origin: Secondary | ICD-10-CM | POA: Diagnosis not present

## 2018-09-28 DIAGNOSIS — D509 Iron deficiency anemia, unspecified: Secondary | ICD-10-CM | POA: Diagnosis not present

## 2018-09-28 DIAGNOSIS — D631 Anemia in chronic kidney disease: Secondary | ICD-10-CM | POA: Diagnosis not present

## 2018-09-29 DIAGNOSIS — Z992 Dependence on renal dialysis: Secondary | ICD-10-CM | POA: Diagnosis not present

## 2018-09-29 DIAGNOSIS — N186 End stage renal disease: Secondary | ICD-10-CM | POA: Diagnosis not present

## 2018-09-29 DIAGNOSIS — N033 Chronic nephritic syndrome with diffuse mesangial proliferative glomerulonephritis: Secondary | ICD-10-CM | POA: Diagnosis not present

## 2018-10-01 DIAGNOSIS — D509 Iron deficiency anemia, unspecified: Secondary | ICD-10-CM | POA: Diagnosis not present

## 2018-10-01 DIAGNOSIS — D631 Anemia in chronic kidney disease: Secondary | ICD-10-CM | POA: Diagnosis not present

## 2018-10-01 DIAGNOSIS — N2581 Secondary hyperparathyroidism of renal origin: Secondary | ICD-10-CM | POA: Diagnosis not present

## 2018-10-01 DIAGNOSIS — N186 End stage renal disease: Secondary | ICD-10-CM | POA: Diagnosis not present

## 2018-10-04 DIAGNOSIS — D509 Iron deficiency anemia, unspecified: Secondary | ICD-10-CM | POA: Diagnosis not present

## 2018-10-04 DIAGNOSIS — N2581 Secondary hyperparathyroidism of renal origin: Secondary | ICD-10-CM | POA: Diagnosis not present

## 2018-10-04 DIAGNOSIS — N186 End stage renal disease: Secondary | ICD-10-CM | POA: Diagnosis not present

## 2018-10-04 DIAGNOSIS — D631 Anemia in chronic kidney disease: Secondary | ICD-10-CM | POA: Diagnosis not present

## 2018-10-06 DIAGNOSIS — N186 End stage renal disease: Secondary | ICD-10-CM | POA: Diagnosis not present

## 2018-10-06 DIAGNOSIS — N2581 Secondary hyperparathyroidism of renal origin: Secondary | ICD-10-CM | POA: Diagnosis not present

## 2018-10-06 DIAGNOSIS — D509 Iron deficiency anemia, unspecified: Secondary | ICD-10-CM | POA: Diagnosis not present

## 2018-10-06 DIAGNOSIS — D631 Anemia in chronic kidney disease: Secondary | ICD-10-CM | POA: Diagnosis not present

## 2018-10-08 DIAGNOSIS — D631 Anemia in chronic kidney disease: Secondary | ICD-10-CM | POA: Diagnosis not present

## 2018-10-08 DIAGNOSIS — D509 Iron deficiency anemia, unspecified: Secondary | ICD-10-CM | POA: Diagnosis not present

## 2018-10-08 DIAGNOSIS — N186 End stage renal disease: Secondary | ICD-10-CM | POA: Diagnosis not present

## 2018-10-08 DIAGNOSIS — N2581 Secondary hyperparathyroidism of renal origin: Secondary | ICD-10-CM | POA: Diagnosis not present

## 2018-10-11 DIAGNOSIS — D631 Anemia in chronic kidney disease: Secondary | ICD-10-CM | POA: Diagnosis not present

## 2018-10-11 DIAGNOSIS — D509 Iron deficiency anemia, unspecified: Secondary | ICD-10-CM | POA: Diagnosis not present

## 2018-10-11 DIAGNOSIS — N186 End stage renal disease: Secondary | ICD-10-CM | POA: Diagnosis not present

## 2018-10-11 DIAGNOSIS — N2581 Secondary hyperparathyroidism of renal origin: Secondary | ICD-10-CM | POA: Diagnosis not present

## 2018-10-13 DIAGNOSIS — N2581 Secondary hyperparathyroidism of renal origin: Secondary | ICD-10-CM | POA: Diagnosis not present

## 2018-10-13 DIAGNOSIS — D509 Iron deficiency anemia, unspecified: Secondary | ICD-10-CM | POA: Diagnosis not present

## 2018-10-13 DIAGNOSIS — N186 End stage renal disease: Secondary | ICD-10-CM | POA: Diagnosis not present

## 2018-10-13 DIAGNOSIS — D631 Anemia in chronic kidney disease: Secondary | ICD-10-CM | POA: Diagnosis not present

## 2018-10-15 DIAGNOSIS — N186 End stage renal disease: Secondary | ICD-10-CM | POA: Diagnosis not present

## 2018-10-15 DIAGNOSIS — N2581 Secondary hyperparathyroidism of renal origin: Secondary | ICD-10-CM | POA: Diagnosis not present

## 2018-10-15 DIAGNOSIS — D631 Anemia in chronic kidney disease: Secondary | ICD-10-CM | POA: Diagnosis not present

## 2018-10-15 DIAGNOSIS — D509 Iron deficiency anemia, unspecified: Secondary | ICD-10-CM | POA: Diagnosis not present

## 2018-10-18 DIAGNOSIS — D509 Iron deficiency anemia, unspecified: Secondary | ICD-10-CM | POA: Diagnosis not present

## 2018-10-18 DIAGNOSIS — D631 Anemia in chronic kidney disease: Secondary | ICD-10-CM | POA: Diagnosis not present

## 2018-10-18 DIAGNOSIS — N2581 Secondary hyperparathyroidism of renal origin: Secondary | ICD-10-CM | POA: Diagnosis not present

## 2018-10-18 DIAGNOSIS — N186 End stage renal disease: Secondary | ICD-10-CM | POA: Diagnosis not present

## 2018-10-20 DIAGNOSIS — D631 Anemia in chronic kidney disease: Secondary | ICD-10-CM | POA: Diagnosis not present

## 2018-10-20 DIAGNOSIS — N2581 Secondary hyperparathyroidism of renal origin: Secondary | ICD-10-CM | POA: Diagnosis not present

## 2018-10-20 DIAGNOSIS — N186 End stage renal disease: Secondary | ICD-10-CM | POA: Diagnosis not present

## 2018-10-20 DIAGNOSIS — D509 Iron deficiency anemia, unspecified: Secondary | ICD-10-CM | POA: Diagnosis not present

## 2018-10-22 DIAGNOSIS — D509 Iron deficiency anemia, unspecified: Secondary | ICD-10-CM | POA: Diagnosis not present

## 2018-10-22 DIAGNOSIS — D631 Anemia in chronic kidney disease: Secondary | ICD-10-CM | POA: Diagnosis not present

## 2018-10-22 DIAGNOSIS — N186 End stage renal disease: Secondary | ICD-10-CM | POA: Diagnosis not present

## 2018-10-22 DIAGNOSIS — N2581 Secondary hyperparathyroidism of renal origin: Secondary | ICD-10-CM | POA: Diagnosis not present

## 2018-10-25 DIAGNOSIS — D509 Iron deficiency anemia, unspecified: Secondary | ICD-10-CM | POA: Diagnosis not present

## 2018-10-25 DIAGNOSIS — N186 End stage renal disease: Secondary | ICD-10-CM | POA: Diagnosis not present

## 2018-10-25 DIAGNOSIS — N2581 Secondary hyperparathyroidism of renal origin: Secondary | ICD-10-CM | POA: Diagnosis not present

## 2018-10-25 DIAGNOSIS — D631 Anemia in chronic kidney disease: Secondary | ICD-10-CM | POA: Diagnosis not present

## 2018-10-27 DIAGNOSIS — D509 Iron deficiency anemia, unspecified: Secondary | ICD-10-CM | POA: Diagnosis not present

## 2018-10-27 DIAGNOSIS — N2581 Secondary hyperparathyroidism of renal origin: Secondary | ICD-10-CM | POA: Diagnosis not present

## 2018-10-27 DIAGNOSIS — D631 Anemia in chronic kidney disease: Secondary | ICD-10-CM | POA: Diagnosis not present

## 2018-10-27 DIAGNOSIS — N186 End stage renal disease: Secondary | ICD-10-CM | POA: Diagnosis not present

## 2018-10-29 DIAGNOSIS — N186 End stage renal disease: Secondary | ICD-10-CM | POA: Diagnosis not present

## 2018-10-29 DIAGNOSIS — N2581 Secondary hyperparathyroidism of renal origin: Secondary | ICD-10-CM | POA: Diagnosis not present

## 2018-10-29 DIAGNOSIS — D509 Iron deficiency anemia, unspecified: Secondary | ICD-10-CM | POA: Diagnosis not present

## 2018-10-29 DIAGNOSIS — D631 Anemia in chronic kidney disease: Secondary | ICD-10-CM | POA: Diagnosis not present

## 2018-10-30 DIAGNOSIS — N186 End stage renal disease: Secondary | ICD-10-CM | POA: Diagnosis not present

## 2018-10-30 DIAGNOSIS — Z992 Dependence on renal dialysis: Secondary | ICD-10-CM | POA: Diagnosis not present

## 2018-10-30 DIAGNOSIS — N033 Chronic nephritic syndrome with diffuse mesangial proliferative glomerulonephritis: Secondary | ICD-10-CM | POA: Diagnosis not present

## 2018-11-01 DIAGNOSIS — N186 End stage renal disease: Secondary | ICD-10-CM | POA: Diagnosis not present

## 2018-11-01 DIAGNOSIS — N2581 Secondary hyperparathyroidism of renal origin: Secondary | ICD-10-CM | POA: Diagnosis not present

## 2018-11-01 DIAGNOSIS — D509 Iron deficiency anemia, unspecified: Secondary | ICD-10-CM | POA: Diagnosis not present

## 2018-11-01 DIAGNOSIS — D631 Anemia in chronic kidney disease: Secondary | ICD-10-CM | POA: Diagnosis not present

## 2018-11-03 ENCOUNTER — Other Ambulatory Visit: Payer: Self-pay

## 2018-11-03 ENCOUNTER — Emergency Department (HOSPITAL_COMMUNITY): Payer: Medicare Other

## 2018-11-03 ENCOUNTER — Encounter (HOSPITAL_COMMUNITY): Payer: Self-pay

## 2018-11-03 ENCOUNTER — Non-Acute Institutional Stay (HOSPITAL_COMMUNITY)
Admission: EM | Admit: 2018-11-03 | Discharge: 2018-11-04 | Disposition: A | Discharge status: Home / Self Care | Payer: Medicare Other | Source: Home / Self Care | Attending: Emergency Medicine | Admitting: Emergency Medicine

## 2018-11-03 ENCOUNTER — Emergency Department (HOSPITAL_COMMUNITY)
Admission: EM | Admit: 2018-11-03 | Discharge: 2018-11-03 | Disposition: A | Discharge status: Home / Self Care | Payer: Medicare Other | Attending: Emergency Medicine | Admitting: Emergency Medicine

## 2018-11-03 ENCOUNTER — Encounter (HOSPITAL_COMMUNITY): Payer: Self-pay | Admitting: Emergency Medicine

## 2018-11-03 DIAGNOSIS — Z79899 Other long term (current) drug therapy: Secondary | ICD-10-CM

## 2018-11-03 DIAGNOSIS — N186 End stage renal disease: Secondary | ICD-10-CM | POA: Insufficient documentation

## 2018-11-03 DIAGNOSIS — I132 Hypertensive heart and chronic kidney disease with heart failure and with stage 5 chronic kidney disease, or end stage renal disease: Secondary | ICD-10-CM | POA: Insufficient documentation

## 2018-11-03 DIAGNOSIS — R0600 Dyspnea, unspecified: Secondary | ICD-10-CM

## 2018-11-03 DIAGNOSIS — Z992 Dependence on renal dialysis: Secondary | ICD-10-CM | POA: Insufficient documentation

## 2018-11-03 DIAGNOSIS — I5022 Chronic systolic (congestive) heart failure: Secondary | ICD-10-CM | POA: Insufficient documentation

## 2018-11-03 DIAGNOSIS — Z7722 Contact with and (suspected) exposure to environmental tobacco smoke (acute) (chronic): Secondary | ICD-10-CM | POA: Insufficient documentation

## 2018-11-03 DIAGNOSIS — R0602 Shortness of breath: Secondary | ICD-10-CM | POA: Diagnosis not present

## 2018-11-03 DIAGNOSIS — Z8249 Family history of ischemic heart disease and other diseases of the circulatory system: Secondary | ICD-10-CM

## 2018-11-03 DIAGNOSIS — D631 Anemia in chronic kidney disease: Secondary | ICD-10-CM

## 2018-11-03 DIAGNOSIS — E785 Hyperlipidemia, unspecified: Secondary | ICD-10-CM

## 2018-11-03 LAB — CBC WITH DIFFERENTIAL/PLATELET
Abs Immature Granulocytes: 0.01 10*3/uL (ref 0.00–0.07)
Basophils Absolute: 0 10*3/uL (ref 0.0–0.1)
Basophils Relative: 1 %
Eosinophils Absolute: 0.1 10*3/uL (ref 0.0–0.5)
Eosinophils Relative: 1 %
HCT: 31.5 % — ABNORMAL LOW (ref 39.0–52.0)
Hemoglobin: 9.5 g/dL — ABNORMAL LOW (ref 13.0–17.0)
Immature Granulocytes: 0 %
Lymphocytes Relative: 25 %
Lymphs Abs: 1.3 10*3/uL (ref 0.7–4.0)
MCH: 29.2 pg (ref 26.0–34.0)
MCHC: 30.2 g/dL (ref 30.0–36.0)
MCV: 96.9 fL (ref 80.0–100.0)
Monocytes Absolute: 0.5 10*3/uL (ref 0.1–1.0)
Monocytes Relative: 10 %
Neutro Abs: 3.2 10*3/uL (ref 1.7–7.7)
Neutrophils Relative %: 63 %
Platelets: 186 10*3/uL (ref 150–400)
RBC: 3.25 MIL/uL — ABNORMAL LOW (ref 4.22–5.81)
RDW: 17.7 % — ABNORMAL HIGH (ref 11.5–15.5)
WBC: 5.1 10*3/uL (ref 4.0–10.5)
nRBC: 0 % (ref 0.0–0.2)

## 2018-11-03 LAB — BASIC METABOLIC PANEL
Anion gap: 18 — ABNORMAL HIGH (ref 5–15)
BUN: 78 mg/dL — ABNORMAL HIGH (ref 6–20)
CO2: 23 mmol/L (ref 22–32)
Calcium: 8.7 mg/dL — ABNORMAL LOW (ref 8.9–10.3)
Chloride: 97 mmol/L — ABNORMAL LOW (ref 98–111)
Creatinine, Ser: 14.64 mg/dL — ABNORMAL HIGH (ref 0.61–1.24)
GFR calc Af Amer: 5 mL/min — ABNORMAL LOW (ref >60)
GFR calc non Af Amer: 4 mL/min — ABNORMAL LOW (ref >60)
Glucose, Bld: 81 mg/dL (ref 70–99)
Potassium: 5.1 mmol/L (ref 3.5–5.1)
Sodium: 138 mmol/L (ref 135–145)

## 2018-11-03 MED ORDER — CHLORHEXIDINE GLUCONATE CLOTH 2 % EX PADS: 6.0000 | MEDICATED_PAD | Freq: Every day | CUTANEOUS | Status: DC

## 2018-11-03 MED ORDER — SODIUM ZIRCONIUM CYCLOSILICATE 10 G PO PACK
10.0000 g | PACK | Freq: Three times a day (TID) | ORAL | Status: DC
  Filled 2018-11-03 (×2): qty 1

## 2018-11-03 MED ORDER — SODIUM ZIRCONIUM CYCLOSILICATE 10 G PO PACK: 10.0000 g | PACK | Freq: Three times a day (TID) | ORAL | 0 refills | Status: AC

## 2018-11-03 NOTE — ED Notes (Signed)
Pt transported to Xray. 
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
## 2018-11-03 NOTE — ED Notes (Signed)
MD bedside

## 2018-11-03 NOTE — ED Triage Notes (Signed)
Pt states his dialysis clinic is refusing him treatment and he is due for dialysis on Mon-Wed-Fri
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
## 2018-11-03 NOTE — ED Notes (Signed)
Got patient vitals patient resting with call bell in reach got patient a warm blanket

## 2018-11-03 NOTE — ED Notes (Signed)
Pt left without receiving discharge instructions and medications.  Refused discharge instructions and medication administration

## 2018-11-03 NOTE — Discharge Instructions (Signed)
Call your dialysis center to find next earliest available dialysis time.  Your potassium level is normal however borderline high and you have been given medication that treats high potassium as you are not having your regularly scheduled dialysis today.

## 2018-11-04 DIAGNOSIS — N186 End stage renal disease: Secondary | ICD-10-CM | POA: Diagnosis not present

## 2018-11-04 LAB — HEPATITIS B SURFACE ANTIGEN: Hepatitis B Surface Ag: NEGATIVE

## 2018-11-04 MED ORDER — PENTAFLUOROPROP-TETRAFLUOROETH EX AERO: 1.0000 "application " | INHALATION_SPRAY | CUTANEOUS | Status: DC | PRN

## 2018-11-04 MED ORDER — SODIUM CHLORIDE 0.9 % IV SOLN: 100.0000 mL | INTRAVENOUS | Status: DC | PRN

## 2018-11-04 MED ORDER — LIDOCAINE HCL (PF) 1 % IJ SOLN: 5.0000 mL | INTRAMUSCULAR | Status: DC | PRN

## 2018-11-04 MED ORDER — HEPARIN SODIUM (PORCINE) 1000 UNIT/ML DIALYSIS: 3000.0000 [IU] | Freq: Once | INTRAMUSCULAR | Status: DC

## 2018-11-04 MED ORDER — HEPARIN SODIUM (PORCINE) 1000 UNIT/ML DIALYSIS: 1000.0000 [IU] | INTRAMUSCULAR | Status: DC | PRN

## 2018-11-04 MED ORDER — LIDOCAINE-PRILOCAINE 2.5-2.5 % EX CREA: 1.0000 "application " | TOPICAL_CREAM | CUTANEOUS | Status: DC | PRN

## 2018-11-04 NOTE — Progress Notes (Signed)
Pt discharged home after HD tx per Nephrologist. Pt completed tx with no complication. Ambulatory and not in any acute distress.

## 2018-11-04 NOTE — ED Notes (Signed)
Transported to dialysis unit in stable condition , respirations unlabored/ denies pain .

## 2018-11-06 ENCOUNTER — Encounter (HOSPITAL_COMMUNITY): Payer: Self-pay | Admitting: Emergency Medicine

## 2018-11-06 ENCOUNTER — Observation Stay (HOSPITAL_COMMUNITY)
Admission: EM | Admit: 2018-11-06 | Discharge: 2018-11-07 | Discharge status: Against Medical Advice | Payer: Medicare Other | Attending: Internal Medicine | Admitting: Internal Medicine

## 2018-11-06 ENCOUNTER — Emergency Department (HOSPITAL_COMMUNITY): Payer: Medicare Other

## 2018-11-06 DIAGNOSIS — I10 Essential (primary) hypertension: Secondary | ICD-10-CM | POA: Diagnosis not present

## 2018-11-06 DIAGNOSIS — I132 Hypertensive heart and chronic kidney disease with heart failure and with stage 5 chronic kidney disease, or end stage renal disease: Secondary | ICD-10-CM | POA: Diagnosis not present

## 2018-11-06 DIAGNOSIS — Z5329 Procedure and treatment not carried out because of patient's decision for other reasons: Principal | ICD-10-CM | POA: Insufficient documentation

## 2018-11-06 DIAGNOSIS — Z992 Dependence on renal dialysis: Secondary | ICD-10-CM

## 2018-11-06 DIAGNOSIS — E875 Hyperkalemia: Secondary | ICD-10-CM | POA: Diagnosis present

## 2018-11-06 DIAGNOSIS — I5022 Chronic systolic (congestive) heart failure: Secondary | ICD-10-CM | POA: Diagnosis present

## 2018-11-06 DIAGNOSIS — D631 Anemia in chronic kidney disease: Secondary | ICD-10-CM | POA: Diagnosis present

## 2018-11-06 DIAGNOSIS — N189 Chronic kidney disease, unspecified: Secondary | ICD-10-CM

## 2018-11-06 DIAGNOSIS — N19 Unspecified kidney failure: Secondary | ICD-10-CM | POA: Diagnosis present

## 2018-11-06 DIAGNOSIS — R600 Localized edema: Secondary | ICD-10-CM | POA: Diagnosis not present

## 2018-11-06 DIAGNOSIS — N186 End stage renal disease: Secondary | ICD-10-CM | POA: Diagnosis not present

## 2018-11-06 DIAGNOSIS — R0602 Shortness of breath: Secondary | ICD-10-CM | POA: Diagnosis present

## 2018-11-06 DIAGNOSIS — R0600 Dyspnea, unspecified: Secondary | ICD-10-CM | POA: Diagnosis not present

## 2018-11-06 DIAGNOSIS — R609 Edema, unspecified: Secondary | ICD-10-CM | POA: Diagnosis present

## 2018-11-06 DIAGNOSIS — I120 Hypertensive chronic kidney disease with stage 5 chronic kidney disease or end stage renal disease: Secondary | ICD-10-CM | POA: Diagnosis not present

## 2018-11-06 LAB — CBC WITH DIFFERENTIAL/PLATELET
Abs Immature Granulocytes: 0.02 10*3/uL (ref 0.00–0.07)
Basophils Absolute: 0 10*3/uL (ref 0.0–0.1)
Basophils Relative: 1 %
Eosinophils Absolute: 0.1 10*3/uL (ref 0.0–0.5)
Eosinophils Relative: 2 %
HCT: 29.1 % — ABNORMAL LOW (ref 39.0–52.0)
Hemoglobin: 8.8 g/dL — ABNORMAL LOW (ref 13.0–17.0)
Immature Granulocytes: 0 %
Lymphocytes Relative: 27 %
Lymphs Abs: 1.4 10*3/uL (ref 0.7–4.0)
MCH: 28.5 pg (ref 26.0–34.0)
MCHC: 30.2 g/dL (ref 30.0–36.0)
MCV: 94.2 fL (ref 80.0–100.0)
Monocytes Absolute: 0.4 10*3/uL (ref 0.1–1.0)
Monocytes Relative: 8 %
Neutro Abs: 3.2 10*3/uL (ref 1.7–7.7)
Neutrophils Relative %: 62 %
Platelets: 193 10*3/uL (ref 150–400)
RBC: 3.09 MIL/uL — ABNORMAL LOW (ref 4.22–5.81)
RDW: 16.8 % — ABNORMAL HIGH (ref 11.5–15.5)
WBC: 5.2 10*3/uL (ref 4.0–10.5)
nRBC: 0 % (ref 0.0–0.2)

## 2018-11-06 LAB — BASIC METABOLIC PANEL
Anion gap: 22 — ABNORMAL HIGH (ref 5–15)
BUN: 102 mg/dL — ABNORMAL HIGH (ref 6–20)
CO2: 21 mmol/L — ABNORMAL LOW (ref 22–32)
Calcium: 8.6 mg/dL — ABNORMAL LOW (ref 8.9–10.3)
Chloride: 96 mmol/L — ABNORMAL LOW (ref 98–111)
Creatinine, Ser: 14.35 mg/dL — ABNORMAL HIGH (ref 0.61–1.24)
GFR calc Af Amer: 5 mL/min — ABNORMAL LOW (ref >60)
GFR calc non Af Amer: 4 mL/min — ABNORMAL LOW (ref >60)
Glucose, Bld: 86 mg/dL (ref 70–99)
Potassium: 5.3 mmol/L — ABNORMAL HIGH (ref 3.5–5.1)
Sodium: 139 mmol/L (ref 135–145)

## 2018-11-06 MED ORDER — ACETAMINOPHEN 325 MG PO TABS
650.0000 mg | ORAL_TABLET | Freq: Four times a day (QID) | ORAL | Status: DC | PRN
  Administered 2018-11-07: 650 mg via ORAL
  Filled 2018-11-06: qty 2

## 2018-11-06 MED ORDER — CHLORHEXIDINE GLUCONATE CLOTH 2 % EX PADS: 6.0000 | MEDICATED_PAD | Freq: Every day | CUTANEOUS | Status: DC

## 2018-11-06 MED ORDER — SENNOSIDES-DOCUSATE SODIUM 8.6-50 MG PO TABS: 1.0000 | ORAL_TABLET | Freq: Every evening | ORAL | Status: DC | PRN

## 2018-11-06 MED ORDER — SODIUM ZIRCONIUM CYCLOSILICATE 10 G PO PACK
10.0000 g | PACK | Freq: Every day | ORAL | Status: DC
  Administered 2018-11-06: 10 g via ORAL
  Filled 2018-11-06 (×2): qty 1

## 2018-11-06 MED ORDER — ONDANSETRON HCL 4 MG/2ML IJ SOLN: 4.0000 mg | Freq: Four times a day (QID) | INTRAMUSCULAR | Status: DC | PRN

## 2018-11-06 MED ORDER — LANTHANUM CARBONATE 500 MG PO CHEW
1000.0000 mg | CHEWABLE_TABLET | Freq: Two times a day (BID) | ORAL | Status: DC
  Filled 2018-11-06: qty 2

## 2018-11-06 MED ORDER — ACETAMINOPHEN 650 MG RE SUPP: 650.0000 mg | Freq: Four times a day (QID) | RECTAL | Status: DC | PRN

## 2018-11-06 MED ORDER — HEPARIN SODIUM (PORCINE) 5000 UNIT/ML IJ SOLN: 5000.0000 [IU] | Freq: Three times a day (TID) | INTRAMUSCULAR | Status: DC

## 2018-11-06 MED ORDER — ONDANSETRON HCL 4 MG PO TABS: 4.0000 mg | ORAL_TABLET | Freq: Four times a day (QID) | ORAL | Status: DC | PRN

## 2018-11-06 MED ORDER — HYDRALAZINE HCL 20 MG/ML IJ SOLN: 5.0000 mg | INTRAMUSCULAR | Status: DC | PRN

## 2018-11-06 NOTE — ED Triage Notes (Signed)
Patient reports he usually does dialysis m/w/f.  His facility refused to treat him on Wednesday.  Was dialyzed here on Thursday.  Here for dialysis.  Patient reports he is working with management at [facility] to figure things out.

## 2018-11-06 NOTE — ED Notes (Signed)
Pt eating chik fil a in waiting area, ambulatory, NAD noted.

## 2018-11-06 NOTE — H&P (Signed)
History and Physical    SEUNG NIDIFFER YCX:[id number] DOB: 01/30/1986 DOA: 11/06/2018  Referring MD/NP/PA:   PCP: Patient, No Pcp Per   Patient coming from:  The patient is coming from home.  At baseline, pt is independent for most of ADL.        Chief Complaint: SOB and edema  HPI: Christopher Wang is a 33 y.o. male with medical history significant of ESRD-HD (MWF), nephrotic syndrome, FSGS, anemia, CHF with EF of 45%, hypertension, who presents with shortness of breath and edema.  Pt states that he usually does dialysis m/w/f.  His facility refused to treat him on Wednesday. He was dialyzed here on Thursday. Pt sates that he developed mild SOB, and feels that he has fluid build up and needs more dialysis. He states that he has puffy face. He had nausea and vomited once.  Patient states that he is a scheduled for dialysis in another facility on Tuesday, but he feels that he cannot wait that long. No abdominal pain or diarrhea.  Patient does not have chest pain or cough, no fever or chills.  Denies symptoms of UTI or unilateral weakness.  ED Course: pt was found to have potassium of 5.3, bicarbonate 21, creatinine 14.35, BUN 102, WBC 5.2, temperature normal, heart rate in 90s, oxygen saturation 100% on room air, negative chest x-ray.  Patient is placed on telemetry bed for observation.  Neurology was consulted for dialysis.  Review of Systems:   General: no fevers, chills, no body weight gain, has fatigue HEENT: no blurry vision, hearing changes or sore throat Respiratory: has mild SOB, no coughing, wheezing CV: no chest pain, no palpitations GI: has nausea, vomiting, no abdominal pain, diarrhea, constipation GU: no dysuria, burning on urination, increased urinary frequency, hematuria  Ext: has leg edema Neuro: no unilateral weakness, numbness, or tingling, no vision change or hearing loss Skin: no rash, no skin tear. MSK: No muscle spasm, no deformity, no limitation of range of  movement in spin Heme: No easy bruising.  Travel history: No recent long distant travel.  Allergy: No Known Allergies  Past Medical History:  Diagnosis Date  . Acute on chronic renal insufficiency 07/15/2013  . Anemia   . Edema of lower extremity 08/06/2011  . Enteritis 07/15/2013  . ESRD (end stage renal disease) on dialysis [facility])    "[employer]; MWF" (07/15/2018)  . Headache   . Hypertension 08/09/2011  . Nephrotic syndrome   . Scrotal edema 08/06/2011  . Syncope 08/03/2013  . UTI (urinary tract infection)     Past Surgical History:  Procedure Laterality Date  . AV FISTULA PLACEMENT Left 07/18/2013   Procedure: ARTERIOVENOUS (AV) FISTULA CREATION ;  Surgeon: Rosetta Posner, MD;  Location: [place];  Service: Vascular;  Laterality: Left;  . INSERTION OF DIALYSIS CATHETER Right 07/18/2013   Procedure: INSERTION OF DIALYSIS CATHETER;  Surgeon: Rosetta Posner, MD;  Location: [place];  Service: Vascular;  Laterality: Right;  . RENAL BIOPSY    . REVISON OF ARTERIOVENOUS FISTULA Left 07/15/2018   FOREARM  . REVISON OF ARTERIOVENOUS FISTULA Left 07/15/2018   Procedure: REVISION OF ARTERIOVENOUS FISTULA LEFT FOREARM;  Surgeon: Waynetta Sandy, MD;  Location: [place];  Service: Vascular;  Laterality: Left;    Social History:  reports that he is a non-smoker but has been exposed to tobacco smoke. He has never used smokeless tobacco. He reports previous alcohol use of about 1.0 standard drinks of alcohol per week. He reports  that he does not use drugs.  Family History:  Family History  Problem Relation Age of Onset  . Hypertension Mother      Prior to Admission medications   Medication Sig Start Date End Date Taking? Authorizing Provider  FOSRENOL 1000 MG PACK Take 1,000 mg by mouth 2 (two) times daily with a meal.  08/06/17  Yes [provider]  oxyCODONE-acetaminophen (PERCOCET) 5-325 MG tablet Take 1 tablet by mouth every 6 (six) hours as needed for severe pain. Patient  not taking: Reported on 08/06/2018 07/15/18   Gabriel Earing, PA-C    Physical Exam: Vitals:   11/06/18 2106 11/06/18 2115 11/06/18 2130 11/07/18 0034  BP:  (!) 130/95 (!) 150/98 134/88  Pulse:  83 (!) 105   Resp:    18  Temp: 98.2 F (36.8 C)   98.7 F (37.1 C)  TempSrc: Oral   Oral  SpO2:  100% 100% 100%  Weight:      Height:       General: Not in acute distress HEENT: has puffy face       Eyes: PERRL, EOMI, no scleral icterus.       ENT: No discharge from the ears and nose, no pharynx injection, no tonsillar enlargement.        Neck: No JVD, no bruit, no mass felt. Heme: No neck lymph node enlargement. Cardiac: S1/S2, RRR, No murmurs, No gallops or rubs. Respiratory: No rales, wheezing, rhonchi or rubs. GI: Soft, nondistended, nontender, no rebound pain, no organomegaly, BS present. GU: No hematuria Ext: has mild leg edema bilaterally. 2+DP/PT pulse bilaterally. Musculoskeletal: No joint deformities, No joint redness or warmth, no limitation of ROM in spin. Skin: No rashes.  Neuro: Alert, oriented X3, cranial nerves II-XII grossly intact, moves all extremities normally. Psych: Patient is not psychotic, no suicidal or hemocidal ideation.  Labs on Admission: I have personally reviewed following labs and imaging studies  CBC: Recent Labs  Lab 11/03/18 1035 11/06/18 2114  WBC 5.1 5.2  NEUTROABS 3.2 3.2  HGB 9.5* 8.8*  HCT 31.5* 29.1*  MCV 96.9 94.2  PLT 186 478   Basic Metabolic Panel: Recent Labs  Lab 11/03/18 1035 11/06/18 2114  NA 138 139  K 5.1 5.3*  CL 97* 96*  CO2 23 21*  GLUCOSE 81 86  BUN 78* 102*  CREATININE 14.64* 14.35*  CALCIUM 8.7* 8.6*   GFR: Estimated Creatinine Clearance: 7.6 mL/min (A) (by C-G formula based on SCr of 14.35 mg/dL (H)). Liver Function Tests: No results for input(s): AST, ALT, ALKPHOS, BILITOT, PROT, ALBUMIN in the last 168 hours. No results for input(s): LIPASE, AMYLASE in the last 168 hours. No results for input(s):  AMMONIA in the last 168 hours. Coagulation Profile: No results for input(s): INR, PROTIME in the last 168 hours. Cardiac Enzymes: No results for input(s): CKTOTAL, CKMB, CKMBINDEX, TROPONINI in the last 168 hours. BNP (last 3 results) No results for input(s): PROBNP in the last 8760 hours. HbA1C: No results for input(s): HGBA1C in the last 72 hours. CBG: No results for input(s): GLUCAP in the last 168 hours. Lipid Profile: No results for input(s): CHOL, HDL, LDLCALC, TRIG, CHOLHDL, LDLDIRECT in the last 72 hours. Thyroid Function Tests: No results for input(s): TSH, T4TOTAL, FREET4, T3FREE, THYROIDAB in the last 72 hours. Anemia Panel: No results for input(s): VITAMINB12, FOLATE, FERRITIN, TIBC, IRON, RETICCTPCT in the last 72 hours. Urine analysis:    Component Value Date/Time   COLORURINE YELLOW 06/05/2018 1925  APPEARANCEUR HAZY (A) 06/05/2018 1925   LABSPEC 1.019 06/05/2018 1925   PHURINE 9.0 (H) 06/05/2018 1925   GLUCOSEU 50 (A) 06/05/2018 1925   HGBUR SMALL (A) 06/05/2018 1925   BILIRUBINUR NEGATIVE 06/05/2018 1925   KETONESUR NEGATIVE 06/05/2018 1925   PROTEINUR >=300 (A) 06/05/2018 1925   UROBILINOGEN 0.2 06/21/2014 1615   NITRITE NEGATIVE 06/05/2018 1925   LEUKOCYTESUR NEGATIVE 06/05/2018 1925   Sepsis Labs: @LABRCNTIP (procalcitonin:4,lacticidven:4) )No results found for this or any previous visit (from the past 240 hour(s)).   Radiological Exams on Admission: Dg Chest 2 View  Result Date: 11/06/2018 CLINICAL DATA:  Dyspnea, end-stage renal disease on dialysis EXAM: CHEST - 2 VIEW COMPARISON:  11/03/2026 chest radiograph. FINDINGS: Stable cardiomediastinal silhouette with normal heart size. No pneumothorax. No pleural effusion. Lungs appear clear, with no acute consolidative airspace disease and no pulmonary edema. IMPRESSION: No active cardiopulmonary disease. Electronically Signed   By: Ilona Sorrel M.D.   On: 11/06/2018 21:49     EKG: Not done in ED, will get  one.   Assessment/Plan Principal Problem:   Uremia Active Problems:   Hypertension   ESRD on dialysis ([place])   Anemia in chronic kidney disease   Systolic heart failure, chronic (HCC)   Hyperkalemia   Uremia: Patient has a uremia, with potassium 5.3, bicarbonate 21, creatinine 14.35, BUN 102.  Patient has mild shortness of breath, worsening edema and no nausea vomiting.  Dr. Grayland Ormond of renal was consulted.  -Placed on telemetry bed for observation -Renal was consulted for dialysis  Hypertension: Blood pressure 150/107.  Patient is not taking medications at home. - IV hydralazine as needed.  Patient blood pressure is persistently elevated, may need to start oral medications tomorrow  ESRD on dialysis [facility]): -Renal was consulted for dialysis  Anemia in chronic kidney disease: Blood pressure slightly dropped from 9.5 on 11/03/2020--> 8.8.  No active bleeding. -f/u by CBC  Systolic heart failure, chronic ([place]): 2D echo on 07/17/2013 showed EF of 45-50% with grade 2 diastolic dysfunction.  Patient has leg edema and mild shortness of breath, but no oxygen desaturation.  Does not seem to have a severe CHF exacerbation. -Volume management via HD per renal  Hyperkalemia: Potassium 5.3 -one dose of Lokelma was given in ED     DVT ppx: SQ Heparin   Code Status: Full code Family Communication: None at bed side.  Disposition Plan:  Anticipate discharge back to previous home environment Consults called:  renal Admission status: Obs / tele     Date of Service 11/07/2018    [facility]   If 7PM-7AM, please contact night-coverage [email or web address] Password [email or web address] 11/07/2018, 12:53 AM

## 2018-11-06 NOTE — ED Provider Notes (Signed)
[facility] EMERGENCY DEPARTMENT Provider Note   CSN: [id number] Arrival date & time: 11/06/18  2022     History   Chief Complaint Chief Complaint  Patient presents with  . needs dialysis    HPI Christopher Wang is a 33 y.o. male.  33 year old male with prior medical history as below presents for evaluation and request for inpatient dialysis.  Patient reports that his last dialysis was Thursday morning.  He has had to change his dialysis center over the last week.  He is not due for another dialysis session until Tuesday morning.  He is concerned that between Thursday and Tuesday he will have gone too long without dialysis.  He complains of feeling fullness in his face.  He denies shortness of breath.  He denies other complaint.  The history is provided by the patient and medical records.  Illness  Location:  Missed dialysis, request inpatient dialysis Severity:  Mild Onset quality:  Gradual Duration:  2 days Timing:  Constant Progression:  Waxing and waning Chronicity:  Recurrent   Past Medical History:  Diagnosis Date  . Acute on chronic renal insufficiency 07/15/2013  . Anemia   . Edema of lower extremity 08/06/2011  . Enteritis 07/15/2013  . ESRD (end stage renal disease) on dialysis [facility])    "[employer]; MWF" (07/15/2018)  . Headache   . Hypertension 08/09/2011  . Nephrotic syndrome   . Scrotal edema 08/06/2011  . Syncope 08/03/2013  . UTI (urinary tract infection)     Patient Active Problem List   Diagnosis Date Noted  . ESRD needing dialysis ([place]) 11/03/2018  . ESRD (end stage renal disease) ([place]) 07/15/2018  . Hyperparathyroidism ([place]) 11/06/2017  . Systolic heart failure, chronic ([place]) 11/06/2017  . Hypotension of hemodialysis 08/03/2013  . Anemia in chronic kidney disease 07/16/2013  . Uremia 07/15/2013  . ESRD on dialysis ([place]) 07/08/2013  . FSGS (focal segmental glomerulosclerosis) with nephrosis 08/15/2011  . Hypertension  08/09/2011  . Hyperlipidemia 08/07/2011  . Nephrotic syndrome 08/06/2011  . Serum albumin decreased 08/06/2011  . Proteinuria 08/06/2011  . Hypokalemia 08/06/2011    Past Surgical History:  Procedure Laterality Date  . AV FISTULA PLACEMENT Left 07/18/2013   Procedure: ARTERIOVENOUS (AV) FISTULA CREATION ;  Surgeon: Rosetta Posner, MD;  Location: [place];  Service: Vascular;  Laterality: Left;  . INSERTION OF DIALYSIS CATHETER Right 07/18/2013   Procedure: INSERTION OF DIALYSIS CATHETER;  Surgeon: Rosetta Posner, MD;  Location: [place];  Service: Vascular;  Laterality: Right;  . RENAL BIOPSY    . REVISON OF ARTERIOVENOUS FISTULA Left 07/15/2018   FOREARM  . REVISON OF ARTERIOVENOUS FISTULA Left 07/15/2018   Procedure: REVISION OF ARTERIOVENOUS FISTULA LEFT FOREARM;  Surgeon: Waynetta Sandy, MD;  Location: [place];  Service: Vascular;  Laterality: Left;        Home Medications    Prior to Admission medications   Medication Sig Start Date End Date Taking? Authorizing Provider  FOSRENOL 1000 MG PACK Take 1,000 mg by mouth 2 (two) times daily with a meal.  08/06/17  Yes [provider]  oxyCODONE-acetaminophen (PERCOCET) 5-325 MG tablet Take 1 tablet by mouth every 6 (six) hours as needed for severe pain. Patient not taking: Reported on 08/06/2018 07/15/18   Gabriel Earing, PA-C    Family History Family History  Problem Relation Age of Onset  . Hypertension Mother     Social History Social History   Tobacco Use  . Smoking  status: Passive Smoke Exposure - Never Smoker  . Smokeless tobacco: Never Used  Substance Use Topics  . Alcohol use: Not Currently    Alcohol/week: 1.0 standard drinks    Types: 1 Shots of liquor per week  . Drug use: No     Allergies   Patient has no known allergies.   Review of Systems Review of Systems  All other systems reviewed and are negative.    Physical Exam Updated Vital Signs BP (!) 150/107   Pulse 90   Temp 98.2 F  (36.8 C) (Oral)   Ht 6' (1.829 m)   Wt 71 kg   SpO2 100%   BMI 21.23 kg/m   Physical Exam Vitals signs and nursing note reviewed.  Constitutional:      General: He is not in acute distress.    Appearance: He is well-developed.  HENT:     Head: Normocephalic and atraumatic.  Eyes:     Conjunctiva/sclera: Conjunctivae normal.     Pupils: Pupils are equal, round, and reactive to light.  Neck:     Musculoskeletal: Normal range of motion and neck supple.  Cardiovascular:     Rate and Rhythm: Normal rate and regular rhythm.     Heart sounds: Normal heart sounds.  Pulmonary:     Effort: Pulmonary effort is normal. No respiratory distress.     Breath sounds: Normal breath sounds.  Abdominal:     General: There is no distension.     Palpations: Abdomen is soft.     Tenderness: There is no abdominal tenderness.  Musculoskeletal: Normal range of motion.        General: No deformity.  Skin:    General: Skin is warm and dry.  Neurological:     Mental Status: He is alert and oriented to person, place, and time.      ED Treatments / Results  Labs (all labs ordered are listed, but only abnormal results are displayed) Labs Reviewed  BASIC METABOLIC PANEL - Abnormal; Notable for the following components:      Result Value   Potassium 5.3 (*)    Chloride 96 (*)    CO2 21 (*)    BUN 102 (*)    Creatinine, Ser 14.35 (*)    Calcium 8.6 (*)    GFR calc non Af Amer 4 (*)    GFR calc Af Amer 5 (*)    Anion gap 22 (*)    All other components within normal limits  CBC WITH DIFFERENTIAL/PLATELET - Abnormal; Notable for the following components:   RBC 3.09 (*)    Hemoglobin 8.8 (*)    HCT 29.1 (*)    RDW 16.8 (*)    All other components within normal limits    EKG None  Radiology Dg Chest 2 View  Result Date: 11/06/2018 CLINICAL DATA:  Dyspnea, end-stage renal disease on dialysis EXAM: CHEST - 2 VIEW COMPARISON:  11/03/2026 chest radiograph. FINDINGS: Stable cardiomediastinal  silhouette with normal heart size. No pneumothorax. No pleural effusion. Lungs appear clear, with no acute consolidative airspace disease and no pulmonary edema. IMPRESSION: No active cardiopulmonary disease. Electronically Signed   By: Ilona Sorrel M.D.   On: 11/06/2018 21:49    Procedures Procedures (including critical care time)  Medications Ordered in ED Medications  sodium zirconium cyclosilicate (LOKELMA) packet 10 g (10 g Oral Given 11/06/18 2230)  Chlorhexidine Gluconate Cloth 2 % PADS 6 each (has no administration in time range)     Initial Impression /  Assessment and Plan / ED Course  I have reviewed the triage vital signs and the nursing notes.  Pertinent labs & imaging results that were available during my care of the patient were reviewed by me and considered in my medical decision making (see chart for details).     MDM  Screen complete  Patient is requesting inpatient dialysis.  Initial history and exam are not suggestive of significant acute pathology.  Potassium is mildly elevated at 5.3.  Chest x-ray is clear.  Case discussed with nephrology -Dr. Grayland Ormond -who reports that the patient may be dialyzed sometime tomorrow.  She does not feel that the patient will get dialysis tonight.  Patient is agreeable to waiting overnight for pending dialysis tomorrow.  Hospitalist service is aware of case and will evaluate for admission.  Final Clinical Impressions(s) / ED Diagnoses   Final diagnoses:  ESRD (end stage renal disease) ([place])  Hyperkalemia    ED Discharge Orders    None       Valarie Merino, MD 11/06/18 2249

## 2018-11-07 ENCOUNTER — Emergency Department (HOSPITAL_COMMUNITY): Payer: Medicare Other

## 2018-11-07 ENCOUNTER — Encounter (HOSPITAL_COMMUNITY): Payer: Self-pay

## 2018-11-07 ENCOUNTER — Encounter (HOSPITAL_COMMUNITY): Payer: Self-pay | Admitting: Emergency Medicine

## 2018-11-07 ENCOUNTER — Observation Stay (HOSPITAL_BASED_OUTPATIENT_CLINIC_OR_DEPARTMENT_OTHER)
Admission: EM | Admit: 2018-11-07 | Discharge: 2018-11-08 | Discharge status: Against Medical Advice | Payer: Medicare Other | Source: Home / Self Care | Attending: Emergency Medicine | Admitting: Emergency Medicine

## 2018-11-07 ENCOUNTER — Other Ambulatory Visit: Payer: Self-pay

## 2018-11-07 DIAGNOSIS — I5022 Chronic systolic (congestive) heart failure: Secondary | ICD-10-CM | POA: Diagnosis present

## 2018-11-07 DIAGNOSIS — Z992 Dependence on renal dialysis: Principal | ICD-10-CM

## 2018-11-07 DIAGNOSIS — Z5329 Procedure and treatment not carried out because of patient's decision for other reasons: Secondary | ICD-10-CM | POA: Insufficient documentation

## 2018-11-07 DIAGNOSIS — I132 Hypertensive heart and chronic kidney disease with heart failure and with stage 5 chronic kidney disease, or end stage renal disease: Secondary | ICD-10-CM | POA: Insufficient documentation

## 2018-11-07 DIAGNOSIS — N186 End stage renal disease: Secondary | ICD-10-CM | POA: Insufficient documentation

## 2018-11-07 DIAGNOSIS — Z7722 Contact with and (suspected) exposure to environmental tobacco smoke (acute) (chronic): Secondary | ICD-10-CM

## 2018-11-07 DIAGNOSIS — I10 Essential (primary) hypertension: Secondary | ICD-10-CM | POA: Diagnosis present

## 2018-11-07 DIAGNOSIS — N19 Unspecified kidney failure: Secondary | ICD-10-CM | POA: Diagnosis present

## 2018-11-07 DIAGNOSIS — R0602 Shortness of breath: Secondary | ICD-10-CM | POA: Diagnosis not present

## 2018-11-07 DIAGNOSIS — D649 Anemia, unspecified: Secondary | ICD-10-CM | POA: Insufficient documentation

## 2018-11-07 DIAGNOSIS — I120 Hypertensive chronic kidney disease with stage 5 chronic kidney disease or end stage renal disease: Secondary | ICD-10-CM | POA: Diagnosis not present

## 2018-11-07 MED ORDER — PENTAFLUOROPROP-TETRAFLUOROETH EX AERO: 1.0000 "application " | INHALATION_SPRAY | CUTANEOUS | Status: DC | PRN

## 2018-11-07 MED ORDER — LIDOCAINE HCL (PF) 1 % IJ SOLN: 5.0000 mL | INTRAMUSCULAR | Status: DC | PRN

## 2018-11-07 MED ORDER — SODIUM CHLORIDE 0.9 % IV SOLN: 100.0000 mL | INTRAVENOUS | Status: DC | PRN

## 2018-11-07 MED ORDER — LIDOCAINE-PRILOCAINE 2.5-2.5 % EX CREA: 1.0000 "application " | TOPICAL_CREAM | CUTANEOUS | Status: DC | PRN

## 2018-11-07 MED ORDER — HEPARIN SODIUM (PORCINE) 1000 UNIT/ML DIALYSIS
1000.0000 [IU] | INTRAMUSCULAR | Status: DC | PRN
  Filled 2018-11-07: qty 1

## 2018-11-07 NOTE — Progress Notes (Signed)
Pt arrived to room. Alert and oriented x4. No acute distress noted. Pt educated about unit. Care plan reviewed carefully with patient. Pt refusing to be placed in gown or have cardiac monitor placed on him. Pt educated on necessity of being monitored and continuously refused.
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
## 2018-11-07 NOTE — Progress Notes (Signed)
Pt resting in bed, pt does not want nurse to order breakfast for him, pt states "I have to go to work at a certain time and cannot wait on dialysis all day" RN educated pt on scheduling and informed that RN will call HD after report to see what time he is scheduled

## 2018-11-07 NOTE — Progress Notes (Signed)
Pt aware of HD scheduling, pt states "I will give them 30 more minutes"  Tried to educate pt on risks of missing HD Pt states "I have to be at work by 2pm" Pt refusing all medication but states okay for assessment Asked pt what else RN could do to help  Pt stated "nothing" Will continue to monitor

## 2018-11-07 NOTE — ED Triage Notes (Signed)
Pt states he was in ED overnight and was scheduled for dialysis today at noon.  States he had to leave to go to work and is returning for dialysis.  Last dialysis was Thursday morning.

## 2018-11-07 NOTE — Progress Notes (Signed)
MD Tamala Julian aware pt left AMA

## 2018-11-07 NOTE — Progress Notes (Signed)
Paged MD Tamala Julian regarding pt wanting to leave AMA  Awaiting call back  Removed pt IV, catheter intact  Pt did not have telemetry monitor on  Pt signed AMA form, aware of risks  Pt states he will got to HD on Tuesday in [place] or come back when he has more time  Pt ambulated off unit with all belongings, pt not waiting for MD

## 2018-11-07 NOTE — Progress Notes (Signed)
Ria Comment from HD stated pt is scheduled for after lunch  Will inform pt

## 2018-11-07 NOTE — Progress Notes (Signed)
Pt refused morning labs. EKG remains pending, pt refused EKG from nurse.

## 2018-11-07 NOTE — ED Notes (Signed)
In room to start IV and get EKG on patient.  States I don't need any of that.  Explained importance of both.  Finally agrees to IV but will not allow me to get an EKG.

## 2018-11-08 DIAGNOSIS — N186 End stage renal disease: Secondary | ICD-10-CM | POA: Diagnosis not present

## 2018-11-08 DIAGNOSIS — I10 Essential (primary) hypertension: Secondary | ICD-10-CM

## 2018-11-08 DIAGNOSIS — Z992 Dependence on renal dialysis: Secondary | ICD-10-CM | POA: Diagnosis not present

## 2018-11-08 DIAGNOSIS — Z5329 Procedure and treatment not carried out because of patient's decision for other reasons: Secondary | ICD-10-CM | POA: Diagnosis not present

## 2018-11-08 LAB — BASIC METABOLIC PANEL
Anion gap: 21 — ABNORMAL HIGH (ref 5–15)
BUN: 118 mg/dL — ABNORMAL HIGH (ref 6–20)
CO2: 18 mmol/L — ABNORMAL LOW (ref 22–32)
Calcium: 8.5 mg/dL — ABNORMAL LOW (ref 8.9–10.3)
Chloride: 101 mmol/L (ref 98–111)
Creatinine, Ser: 17.07 mg/dL — ABNORMAL HIGH (ref 0.61–1.24)
GFR calc Af Amer: 4 mL/min — ABNORMAL LOW (ref >60)
GFR calc non Af Amer: 3 mL/min — ABNORMAL LOW (ref >60)
Glucose, Bld: 69 mg/dL — ABNORMAL LOW (ref 70–99)
Potassium: 5.1 mmol/L (ref 3.5–5.1)
Sodium: 140 mmol/L (ref 135–145)

## 2018-11-08 LAB — CBC WITH DIFFERENTIAL/PLATELET
Abs Immature Granulocytes: 0.01 10*3/uL (ref 0.00–0.07)
Basophils Absolute: 0.1 10*3/uL (ref 0.0–0.1)
Basophils Relative: 1 %
Eosinophils Absolute: 0.1 10*3/uL (ref 0.0–0.5)
Eosinophils Relative: 2 %
HCT: 27.6 % — ABNORMAL LOW (ref 39.0–52.0)
Hemoglobin: 8.8 g/dL — ABNORMAL LOW (ref 13.0–17.0)
Immature Granulocytes: 0 %
Lymphocytes Relative: 27 %
Lymphs Abs: 1.7 10*3/uL (ref 0.7–4.0)
MCH: 29.5 pg (ref 26.0–34.0)
MCHC: 31.9 g/dL (ref 30.0–36.0)
MCV: 92.6 fL (ref 80.0–100.0)
Monocytes Absolute: 0.5 10*3/uL (ref 0.1–1.0)
Monocytes Relative: 8 %
Neutro Abs: 4 10*3/uL (ref 1.7–7.7)
Neutrophils Relative %: 62 %
Platelets: 204 10*3/uL (ref 150–400)
RBC: 2.98 MIL/uL — ABNORMAL LOW (ref 4.22–5.81)
RDW: 16.6 % — ABNORMAL HIGH (ref 11.5–15.5)
WBC: 6.3 10*3/uL (ref 4.0–10.5)
nRBC: 0 % (ref 0.0–0.2)

## 2018-11-08 LAB — GLUCOSE, CAPILLARY
Glucose-Capillary: 62 mg/dL — ABNORMAL LOW (ref 70–99)
Glucose-Capillary: 120 mg/dL — ABNORMAL HIGH (ref 70–99)

## 2018-11-08 MED ORDER — HEPARIN SODIUM (PORCINE) 5000 UNIT/ML IJ SOLN
5000.0000 [IU] | Freq: Three times a day (TID) | INTRAMUSCULAR | Status: DC
  Filled 2018-11-08: qty 1

## 2018-11-08 MED ORDER — DARBEPOETIN ALFA 150 MCG/0.3ML IJ SOSY
PREFILLED_SYRINGE | INTRAMUSCULAR | Status: AC
  Administered 2018-11-08: 150 ug via INTRAVENOUS
  Filled 2018-11-08: qty 0.3

## 2018-11-08 MED ORDER — ONDANSETRON HCL 4 MG PO TABS: 4.0000 mg | ORAL_TABLET | Freq: Four times a day (QID) | ORAL | Status: DC | PRN

## 2018-11-08 MED ORDER — ACETAMINOPHEN 325 MG PO TABS: 650.0000 mg | ORAL_TABLET | Freq: Four times a day (QID) | ORAL | Status: DC | PRN

## 2018-11-08 MED ORDER — ACETAMINOPHEN 650 MG RE SUPP: 650.0000 mg | Freq: Four times a day (QID) | RECTAL | Status: DC | PRN

## 2018-11-08 MED ORDER — HYDRALAZINE HCL 20 MG/ML IJ SOLN: 10.0000 mg | INTRAMUSCULAR | Status: DC | PRN

## 2018-11-08 MED ORDER — ONDANSETRON HCL 4 MG/2ML IJ SOLN: 4.0000 mg | Freq: Four times a day (QID) | INTRAMUSCULAR | Status: DC | PRN

## 2018-11-08 MED ORDER — LANTHANUM CARBONATE 500 MG PO CHEW
1000.0000 mg | CHEWABLE_TABLET | Freq: Two times a day (BID) | ORAL | Status: DC
  Filled 2018-11-08: qty 2

## 2018-11-08 MED ORDER — DARBEPOETIN ALFA 150 MCG/0.3ML IJ SOSY
150.0000 ug | PREFILLED_SYRINGE | INTRAMUSCULAR | Status: DC
  Administered 2018-11-08: 150 ug via INTRAVENOUS
  Filled 2018-11-08: qty 0.3

## 2018-11-08 NOTE — Progress Notes (Signed)
HD tx completed @ 2110 w/o problem UF goal met Blood rinsed back VSS Pt was supposed to go back to his room to be d/c home from there but he refused to go back to his room stating that "I don't have time for that", and he walked out of unit to go home. At time of d/c from unit pt was VSS and walking well. 5C was called to let them know that pt had left from unit and wouldn't be returning to them

## 2018-11-08 NOTE — Progress Notes (Signed)
Pt refused am med and vital signs at this time.

## 2018-11-08 NOTE — Progress Notes (Signed)
Hypoglycemic Event  CBG: 62 Treatment: 4 oz juice/soda  Symptoms: Shaky  Follow-up CBG: [id number] CBG Result:120  Possible Reasons for Event: Inadequate meal intake  Comments/MD notified:n/a. Protocol followed and repeat BG was therapeutic.   Vicente Masson Cheyla Duchemin

## 2018-11-08 NOTE — H&P (Signed)
History and Physical    Christopher Wang [id number] DOB: 01-Sep-1986 DOA: 11/07/2018  PCP: Patient, No Pcp Per  Patient coming from: Home  I have personally briefly reviewed patient's old medical records in [place]  Chief Complaint: SOB, needs dialysis  HPI: Christopher Wang is a 33 y.o. male with medical history significant of  ESRD-HD (MWF), nephrotic syndrome, FSGS, anemia, CHF with EF of 45%, hypertension, who presents with shortness of breath and edema.  Pt states that he usually does dialysis m/w/f. His facility refused to treat him on Wednesday. He was dialyzed here on Thursday. Pt sates that he developed mild SOB, and feels that he has fluid build up and needs more dialysis.  He was admitted for dialysis early yesterday morning and scheduled for dialysis around noon earlier during the day yesterday; however, patient said he had to go to work and left AMA before he could go to dialysis.  Now returns to ED and says he will stay for dialysis.   ED Course: CXR today neg for pulm edema.  EDP spoke with nephrology and they are planning on doing dialysis on him during the day, although he is warned it may not be until second shift.  BMP shows K 5.1, BUN 118   Review of Systems: As per HPI otherwise 10 point review of systems negative.   Past Medical History:  Diagnosis Date  . Acute on chronic renal insufficiency 07/15/2013  . Anemia   . Edema of lower extremity 08/06/2011  . Enteritis 07/15/2013  . ESRD (end stage renal disease) on dialysis [facility])    "[employer]; MWF" (07/15/2018)  . Headache   . Hypertension 08/09/2011  . Nephrotic syndrome   . Scrotal edema 08/06/2011  . Syncope 08/03/2013  . UTI (urinary tract infection)     Past Surgical History:  Procedure Laterality Date  . AV FISTULA PLACEMENT Left 07/18/2013   Procedure: ARTERIOVENOUS (AV) FISTULA CREATION ;  Surgeon: Rosetta Posner, MD;  Location: [place];  Service: Vascular;  Laterality: Left;  .  INSERTION OF DIALYSIS CATHETER Right 07/18/2013   Procedure: INSERTION OF DIALYSIS CATHETER;  Surgeon: Rosetta Posner, MD;  Location: [place];  Service: Vascular;  Laterality: Right;  . RENAL BIOPSY    . REVISON OF ARTERIOVENOUS FISTULA Left 07/15/2018   FOREARM  . REVISON OF ARTERIOVENOUS FISTULA Left 07/15/2018   Procedure: REVISION OF ARTERIOVENOUS FISTULA LEFT FOREARM;  Surgeon: Waynetta Sandy, MD;  Location: [place];  Service: Vascular;  Laterality: Left;     reports that he is a non-smoker but has been exposed to tobacco smoke. He has never used smokeless tobacco. He reports previous alcohol use of about 1.0 standard drinks of alcohol per week. He reports that he does not use drugs.  No Known Allergies  Family History  Problem Relation Age of Onset  . Hypertension Mother      Prior to Admission medications   Medication Sig Start Date End Date Taking? Authorizing Provider  FOSRENOL 1000 MG PACK Take 1,000 mg by mouth 2 (two) times daily with a meal.  08/06/17  Yes [provider]    Physical Exam: Vitals:   11/07/18 2139  BP: (!) 155/105  Pulse: 79  Resp: 20  Temp: (!) 97.5 F (36.4 C)  SpO2: 100%    Constitutional: NAD, calm, comfortable Eyes: PERRL, lids and conjunctivae normal ENMT: Mucous membranes are moist. Posterior pharynx clear of any exudate or lesions.Normal dentition.  Neck: normal, supple,  no masses, no thyromegaly Respiratory: clear to auscultation bilaterally, no wheezing, no crackles. Normal respiratory effort. No accessory muscle use.  Cardiovascular: Regular rate and rhythm, no murmurs / rubs / gallops. No extremity edema. 2+ pedal pulses. No carotid bruits.  Abdomen: no tenderness, no masses palpated. No hepatosplenomegaly. Bowel sounds positive.  Musculoskeletal: no clubbing / cyanosis. No joint deformity upper and lower extremities. Good ROM, no contractures. Normal muscle tone.  Skin: no rashes, lesions, ulcers. No  induration Neurologic: CN 2-12 grossly intact. Sensation intact, DTR normal. Strength 5/5 in all 4.  Psychiatric: Normal judgment and insight. Alert and oriented x 3. Normal mood.    Labs on Admission: I have personally reviewed following labs and imaging studies  CBC: Recent Labs  Lab 11/03/18 1035 11/06/18 2114  WBC 5.1 5.2  NEUTROABS 3.2 3.2  HGB 9.5* 8.8*  HCT 31.5* 29.1*  MCV 96.9 94.2  PLT 186 268   Basic Metabolic Panel: Recent Labs  Lab 11/03/18 1035 11/06/18 2114  NA 138 139  K 5.1 5.3*  CL 97* 96*  CO2 23 21*  GLUCOSE 81 86  BUN 78* 102*  CREATININE 14.64* 14.35*  CALCIUM 8.7* 8.6*   GFR: Estimated Creatinine Clearance: 7.6 mL/min (A) (by C-G formula based on SCr of 14.35 mg/dL (H)). Liver Function Tests: No results for input(s): AST, ALT, ALKPHOS, BILITOT, PROT, ALBUMIN in the last 168 hours. No results for input(s): LIPASE, AMYLASE in the last 168 hours. No results for input(s): AMMONIA in the last 168 hours. Coagulation Profile: No results for input(s): INR, PROTIME in the last 168 hours. Cardiac Enzymes: No results for input(s): CKTOTAL, CKMB, CKMBINDEX, TROPONINI in the last 168 hours. BNP (last 3 results) No results for input(s): PROBNP in the last 8760 hours. HbA1C: No results for input(s): HGBA1C in the last 72 hours. CBG: No results for input(s): GLUCAP in the last 168 hours. Lipid Profile: No results for input(s): CHOL, HDL, LDLCALC, TRIG, CHOLHDL, LDLDIRECT in the last 72 hours. Thyroid Function Tests: No results for input(s): TSH, T4TOTAL, FREET4, T3FREE, THYROIDAB in the last 72 hours. Anemia Panel: No results for input(s): VITAMINB12, FOLATE, FERRITIN, TIBC, IRON, RETICCTPCT in the last 72 hours. Urine analysis:    Component Value Date/Time   COLORURINE YELLOW 06/05/2018 1925   APPEARANCEUR HAZY (A) 06/05/2018 1925   LABSPEC 1.019 06/05/2018 1925   PHURINE 9.0 (H) 06/05/2018 1925   GLUCOSEU 50 (A) 06/05/2018 1925   HGBUR SMALL (A)  06/05/2018 1925   BILIRUBINUR NEGATIVE 06/05/2018 1925   KETONESUR NEGATIVE 06/05/2018 1925   PROTEINUR >=300 (A) 06/05/2018 1925   UROBILINOGEN 0.2 06/21/2014 1615   NITRITE NEGATIVE 06/05/2018 1925   LEUKOCYTESUR NEGATIVE 06/05/2018 1925    Radiological Exams on Admission: Dg Chest 2 View  Result Date: 11/08/2018 CLINICAL DATA:  Shortness of Breath EXAM: CHEST - 2 VIEW COMPARISON:  11/06/2018 FINDINGS: The heart size and mediastinal contours are within normal limits. Both lungs are clear. The visualized skeletal structures are unremarkable. IMPRESSION: No active cardiopulmonary disease. Electronically Signed   By: Inez Catalina M.D.   On: 11/08/2018 00:07   Dg Chest 2 View  Result Date: 11/06/2018 CLINICAL DATA:  Dyspnea, end-stage renal disease on dialysis EXAM: CHEST - 2 VIEW COMPARISON:  11/03/2026 chest radiograph. FINDINGS: Stable cardiomediastinal silhouette with normal heart size. No pneumothorax. No pleural effusion. Lungs appear clear, with no acute consolidative airspace disease and no pulmonary edema. IMPRESSION: No active cardiopulmonary disease. Electronically Signed   By: Janina Mayo.D.  On: 11/06/2018 21:49    EKG: Independently reviewed.  Assessment/Plan Principal Problem:   ESRD needing dialysis [facility]) Active Problems:   Hypertension   Uremia   Systolic heart failure, chronic ([place])    1. ESRD needing dialysis - 1. K 5.1, BUN 118. 2. EDP spoke with nephrology 3. They plan to do dialysis later today 4. Will put in bed request. 2. HTN - not taking chronic BP meds 1. Use PRN hydralazine if needed, though BP only running 150-160 at the moment  DVT prophylaxis: Heparin  Code Status: Full Family Communication: No family in room Disposition Plan: Home after admit Consults called: Nephrology Admission status: Place in 32    , [place] [facility]  How to contact the [facility] Attending or Consulting provider [place] or covering provider  during after hours [place], for this patient?  1. Check the care team in [facility] and look for a) attending/consulting [facility] provider listed and b) the [facility] team listed 2. Log into [email or web address]  Amion Physician Scheduling and messaging for groups and whole hospitals  On call and physician scheduling software for group practices, residents, hospitalists and other medical providers for call, clinic, rotation and shift schedules. OnCall [employer] is a hospital-wide system for scheduling doctors and paging doctors on call. EasyPlot is for scientific plotting and data analysis.  [email or web address]  and use [place]'s universal password to access. If you do not have the password, please contact the hospital operator.  3. Locate the [facility] provider you are looking for under [facility] and page to a number that you can be directly reached. 4. If you still have difficulty reaching the provider, please page the [facility] (Director on Call) for the Hospitalists listed on amion for assistance.  11/08/2018, 1:25 AM

## 2018-11-08 NOTE — Progress Notes (Signed)
Received pt from [place] HD tx initiated @ 1710 via 15G x 2 by Almyra Brace, RN w/o problem per report Pull/push/flush well w/o problem, per report VSS, per report Will continue to monitor while on HD tx

## 2018-11-08 NOTE — ED Notes (Signed)
Pt refusing gown, monitoring equipment and IV at this time.

## 2018-11-08 NOTE — Progress Notes (Addendum)
New pt admitted into 5c12. AOx4 on arrival. Though got report that he had refused nursing care to be done on him but allowed couple of care to be done on him. Per ER nurse patient refused iv access and MD aware of it. BG repeated and was 62. Encouraged to eat something since he had refused earlier. Will recheck BG and monitor closely.

## 2018-11-08 NOTE — Progress Notes (Signed)
RN was notified by Dialysis Staff that pt's dialysis completed but he had refused to come back to the unit. Patient had discharge orders anyway and physically d/c papers were not given. Per RN patient was stable and assessments unchanged prior leaving dialysis premises.

## 2018-11-08 NOTE — Progress Notes (Signed)
Patient refusing all meds and care.  States "all I want is dialysis". MD notified.  Marcille Blanco, RN

## 2018-11-08 NOTE — Progress Notes (Signed)
In dialysis during shift change.
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
## 2018-11-08 NOTE — Discharge Summary (Addendum)
Christopher Wang, is a 33 y.o. male  DOB May 14, 1986  MRN [id number].  Admission date:  11/07/2018  Admitting Physician  Etta Quill, DO  Discharge Date:  11/08/2018   Primary MD  Patient, No Pcp Per  Recommendations for primary care physician for things to follow:     Discharge Diagnosis  Principal Problem:   ESRD needing dialysis [facility]) Active Problems:   Hypertension   Uremia   Systolic heart failure, chronic ([place])      Past Medical History:  Diagnosis Date  . Acute on chronic renal insufficiency 07/15/2013  . Anemia   . Edema of lower extremity 08/06/2011  . Enteritis 07/15/2013  . ESRD (end stage renal disease) on dialysis [facility])    "[employer]; MWF" (07/15/2018)  . Headache   . Hypertension 08/09/2011  . Nephrotic syndrome   . Scrotal edema 08/06/2011  . Syncope 08/03/2013  . UTI (urinary tract infection)     Past Surgical History:  Procedure Laterality Date  . AV FISTULA PLACEMENT Left 07/18/2013   Procedure: ARTERIOVENOUS (AV) FISTULA CREATION ;  Surgeon: Rosetta Posner, MD;  Location: [place];  Service: Vascular;  Laterality: Left;  . INSERTION OF DIALYSIS CATHETER Right 07/18/2013   Procedure: INSERTION OF DIALYSIS CATHETER;  Surgeon: Rosetta Posner, MD;  Location: [place];  Service: Vascular;  Laterality: Right;  . RENAL BIOPSY    . REVISON OF ARTERIOVENOUS FISTULA Left 07/15/2018   FOREARM  . REVISON OF ARTERIOVENOUS FISTULA Left 07/15/2018   Procedure: REVISION OF ARTERIOVENOUS FISTULA LEFT FOREARM;  Surgeon: Waynetta Sandy, MD;  Location: [place];  Service: Vascular;  Laterality: Left;       HPI  from the history and physical done on the day of admission:  Christopher Wang is a 33 y.o. male with medical history significant of ESRD-HD (MWF), nephrotic syndrome, FSGS, anemia, CHF with EF of  45%, hypertension, who presents with shortness of breath and edema.  Pt states that heusually does dialysis m/w/f. His facility refused to treat him on Wednesday.He was dialyzed here on Thursday.Pt sates that he developed mild SOB, and feels that he has fluid build up and needs more dialysis.  He was admitted for dialysis early yesterday morning and scheduled for dialysis around noon earlier during the day yesterday; however, patient said he had to go to work and left AMA before he could go to dialysis.  Now returns to ED and says he will stay for dialysis     Hospital Course:     Patient Left AMA: Prior to evaluating the patient he determined that he would leave [place].  Patient did not receive dialysis and stated that he would come here or somewhere else once he he would was extensively counseled that he did have a very serious condition which required medical attention by nursing staff. He was informed that the consequence of leaving AMA is death or serious injury. He did acknowledge these risks and showed understanding. He did express coherence and  ability to make his own decisions. However, he did elect to leave the hospital AMA.       Consults obtained: nephrology  Discharge Condition: Unknown  Diet and Activity recommendation: See Discharge Instructions below    Discharge Medications     Allergies as of 11/08/2018   No Known Allergies   Med Red had not been able to be reconciled.       Major procedures and Radiology Reports - PLEASE review detailed and final reports for all details, in brief -      Dg Chest 2 View  Result Date: 11/08/2018 CLINICAL DATA:  Shortness of Breath EXAM: CHEST - 2 VIEW COMPARISON:  11/06/2018 FINDINGS: The heart size and mediastinal contours are within normal limits. Both lungs are clear. The visualized skeletal structures are unremarkable. IMPRESSION: No active cardiopulmonary disease. Electronically Signed   By: Inez Catalina M.D.   On: 11/08/2018 00:07   Dg Chest 2 View  Result Date: 11/06/2018 CLINICAL DATA:  Dyspnea, end-stage renal disease on dialysis EXAM: CHEST - 2 VIEW COMPARISON:  11/03/2026 chest radiograph. FINDINGS: Stable cardiomediastinal silhouette with normal heart size. No pneumothorax. No pleural effusion. Lungs appear clear, with no acute consolidative airspace disease and no pulmonary edema. IMPRESSION: No active cardiopulmonary disease. Electronically Signed   By: Ilona Sorrel M.D.   On: 11/06/2018 21:49   Dg Chest 2 View  Result Date: 11/03/2018 CLINICAL DATA:  Shortness of breath.  Dialysis patient. EXAM: CHEST - 2 VIEW COMPARISON:  01/17/2018 FINDINGS: Artifact overlies the chest. Heart size upper limits of normal. Mediastinal shadows are normal. Lungs are clear. No edema, infiltrate, collapse or effusion. IMPRESSION: No active cardiopulmonary disease. Electronically Signed   By: Nelson Chimes M.D.   On: 11/03/2018 10:14    Micro Results    No results found for this or any previous visit (from the past 240 hour(s)).     Today   Subjective    Christopher Wang left prior to being evaluated.   Objective   Blood pressure (!) 141/108, pulse 69, temperature 97.7 F (36.5 C), temperature source Oral, resp. rate 20, SpO2 100 %.   Intake/Output Summary (Last 24 hours) at 11/08/2018 0326 Last data filed at 11/08/2018 0254 Gross per 24 hour  Intake 120 ml  Output -  Net 120 ml      Data Review   CBC w Diff:  Lab Results  Component Value Date   WBC 6.3 11/08/2018   HGB 8.8 (L) 11/08/2018   HCT 27.6 (L) 11/08/2018   PLT 204 11/08/2018   LYMPHOPCT 27 11/08/2018   MONOPCT 8 11/08/2018   EOSPCT 2 11/08/2018   BASOPCT 1 11/08/2018    CMP:  Lab Results  Component Value Date   NA 140 11/08/2018   K 5.1 11/08/2018   CL 101 11/08/2018   CO2 18 (L) 11/08/2018   BUN 118 (H) 11/08/2018   CREATININE 17.07 (H) 11/08/2018   PROT 8.9 (H) 07/08/2016   ALBUMIN 4.7 07/08/2016    BILITOT 0.5 07/08/2016   ALKPHOS 44 07/08/2016   AST 15 07/08/2016   ALT 13 (L) 07/08/2016  .   Total Time in preparing paper work, data evaluation and todays exam - 35 minutes  Norval Morton M.D on 11/08/2018 at 3:26 AM  [facility]   Office  [phone]

## 2018-11-09 DIAGNOSIS — N2581 Secondary hyperparathyroidism of renal origin: Secondary | ICD-10-CM | POA: Diagnosis not present

## 2018-11-09 DIAGNOSIS — N186 End stage renal disease: Secondary | ICD-10-CM | POA: Diagnosis not present

## 2018-11-09 DIAGNOSIS — D631 Anemia in chronic kidney disease: Secondary | ICD-10-CM | POA: Diagnosis not present

## 2018-11-09 NOTE — Discharge Summary (Addendum)
Christopher Wang, is a 33 y.o. male  DOB 03-Mar-1986  MRN [id number].  Admission date:  11/06/2018  Admitting Physician  Ivor Costa, MD  Discharge Date:  11/07/2018   Primary MD  Patient, No Pcp Per  Recommendations for primary care physician for things to follow:     Discharge Diagnosis  Principal Problem:   Uremia Active Problems:   Hypertension   ESRD on dialysis ([place])   Anemia in chronic kidney disease   Systolic heart failure, chronic (HCC)   Hyperkalemia      Past Medical History:  Diagnosis Date  . Acute on chronic renal insufficiency 07/15/2013  . Anemia   . Edema of lower extremity 08/06/2011  . Enteritis 07/15/2013  . ESRD (end stage renal disease) on dialysis [facility])    "[employer]; MWF" (07/15/2018)  . Headache   . Hypertension 08/09/2011  . Nephrotic syndrome   . Scrotal edema 08/06/2011  . Syncope 08/03/2013  . UTI (urinary tract infection)     Past Surgical History:  Procedure Laterality Date  . AV FISTULA PLACEMENT Left 07/18/2013   Procedure: ARTERIOVENOUS (AV) FISTULA CREATION ;  Surgeon: Rosetta Posner, MD;  Location: [place];  Service: Vascular;  Laterality: Left;  . INSERTION OF DIALYSIS CATHETER Right 07/18/2013   Procedure: INSERTION OF DIALYSIS CATHETER;  Surgeon: Rosetta Posner, MD;  Location: [place];  Service: Vascular;  Laterality: Right;  . RENAL BIOPSY    . REVISON OF ARTERIOVENOUS FISTULA Left 07/15/2018   FOREARM  . REVISON OF ARTERIOVENOUS FISTULA Left 07/15/2018   Procedure: REVISION OF ARTERIOVENOUS FISTULA LEFT FOREARM;  Surgeon: Waynetta Sandy, MD;  Location: [place];  Service: Vascular;  Laterality: Left;       HPI  from the history and physical done on the day of admission:  Christopher Wang is a 33 y.o. male with medical history significant of ESRD-HD (MWF), nephrotic  syndrome, FSGS, anemia, CHF with EF of 45%, hypertension, who presents with shortness of breath and edema.  Pt states that he usually does dialysis m/w/f. His facility refused to treat him on Wednesday. He was dialyzed here on Thursday. Pt sates that he developed mild SOB, and feels that he has fluid build up and needs more dialysis. He states that he has puffy face. He had nausea and vomited once.  Patient states that he is a scheduled for dialysis in another facility on Tuesday, but he feels that he cannot wait that long. No abdominal pain or diarrhea.  Patient does not have chest pain or cough, no fever or chills.  Denies symptoms of UTI or unilateral weakness.  ED Course: pt was found to have potassium of 5.3, bicarbonate 21, creatinine 14.35, BUN 102, WBC 5.2, temperature normal, heart rate in 90s, oxygen saturation 100% on room air, negative chest x-ray.  Patient is placed on telemetry bed for observation.  Neurology was consulted for dialysis.     Hospital Course:     Patient Left AMA: Prior to evaluating the patient  he determined that he would leave [place].  Patient did not receive dialysis and stated that he would come here or somewhere else once he he would was extensively counseled that he did have a very serious condition which required medical attention by nursing staff. He was informed that the consequence of leaving AMA is death or serious injury. He did acknowledge these risks and showed understanding. He did express coherence and ability to make his own decisions. However, he did elect to leave the hospital AMA.       Consults obtained: nephrology  Discharge Condition: Unknown  Diet and Activity recommendation: See Discharge Instructions below    Discharge Medications     Allergies as of 11/08/2018   No Known Allergies   Med Red had not been able to be reconciled.       Major procedures and Radiology Reports - PLEASE review detailed and final  reports for all details, in brief -      Dg Chest 2 View  Result Date: 11/08/2018 CLINICAL DATA:  Shortness of Breath EXAM: CHEST - 2 VIEW COMPARISON:  11/06/2018 FINDINGS: The heart size and mediastinal contours are within normal limits. Both lungs are clear. The visualized skeletal structures are unremarkable. IMPRESSION: No active cardiopulmonary disease. Electronically Signed   By: Inez Catalina M.D.   On: 11/08/2018 00:07   Dg Chest 2 View  Result Date: 11/06/2018 CLINICAL DATA:  Dyspnea, end-stage renal disease on dialysis EXAM: CHEST - 2 VIEW COMPARISON:  11/03/2026 chest radiograph. FINDINGS: Stable cardiomediastinal silhouette with normal heart size. No pneumothorax. No pleural effusion. Lungs appear clear, with no acute consolidative airspace disease and no pulmonary edema. IMPRESSION: No active cardiopulmonary disease. Electronically Signed   By: Ilona Sorrel M.D.   On: 11/06/2018 21:49   Dg Chest 2 View  Result Date: 11/03/2018 CLINICAL DATA:  Shortness of breath.  Dialysis patient. EXAM: CHEST - 2 VIEW COMPARISON:  01/17/2018 FINDINGS: Artifact overlies the chest. Heart size upper limits of normal. Mediastinal shadows are normal. Lungs are clear. No edema, infiltrate, collapse or effusion. IMPRESSION: No active cardiopulmonary disease. Electronically Signed   By: Nelson Chimes M.D.   On: 11/03/2018 10:14    Micro Results    No results found for this or any previous visit (from the past 240 hour(s)).     Today   Subjective    Christopher Wang left prior to being evaluated.   Objective   Blood pressure 112/73, pulse 76, temperature 98.9 F (37.2 C), temperature source Oral, resp. rate 18, height 6' (1.829 m), weight 71 kg, SpO2 100 %.  No intake or output data in the 24 hours ending 11/09/18 0409    Data Review   CBC w Diff:  Lab Results  Component Value Date   WBC 6.3 11/08/2018   HGB 8.8 (L) 11/08/2018   HCT 27.6 (L) 11/08/2018   PLT 204 11/08/2018   LYMPHOPCT  27 11/08/2018   MONOPCT 8 11/08/2018   EOSPCT 2 11/08/2018   BASOPCT 1 11/08/2018    CMP:  Lab Results  Component Value Date   NA 140 11/08/2018   K 5.1 11/08/2018   CL 101 11/08/2018   CO2 18 (L) 11/08/2018   BUN 118 (H) 11/08/2018   CREATININE 17.07 (H) 11/08/2018   PROT 8.9 (H) 07/08/2016   ALBUMIN 4.7 07/08/2016   BILITOT 0.5 07/08/2016   ALKPHOS 44 07/08/2016   AST 15 07/08/2016   ALT 13 (L) 07/08/2016  .  Total Time in preparing paper work, data evaluation and todays exam - 35 minutes  Norval Morton M.D on 11/07/2018 at 4:09 AM  [facility]   Office  [phone]

## 2018-11-11 DIAGNOSIS — N186 End stage renal disease: Secondary | ICD-10-CM | POA: Diagnosis not present

## 2018-11-11 DIAGNOSIS — D631 Anemia in chronic kidney disease: Secondary | ICD-10-CM | POA: Diagnosis not present

## 2018-11-11 DIAGNOSIS — N2581 Secondary hyperparathyroidism of renal origin: Secondary | ICD-10-CM | POA: Diagnosis not present

## 2018-11-13 DIAGNOSIS — D631 Anemia in chronic kidney disease: Secondary | ICD-10-CM | POA: Diagnosis not present

## 2018-11-13 DIAGNOSIS — N186 End stage renal disease: Secondary | ICD-10-CM | POA: Diagnosis not present

## 2018-11-13 DIAGNOSIS — N2581 Secondary hyperparathyroidism of renal origin: Secondary | ICD-10-CM | POA: Diagnosis not present

## 2018-11-16 DIAGNOSIS — D509 Iron deficiency anemia, unspecified: Secondary | ICD-10-CM | POA: Diagnosis not present

## 2018-11-16 DIAGNOSIS — N2581 Secondary hyperparathyroidism of renal origin: Secondary | ICD-10-CM | POA: Diagnosis not present

## 2018-11-16 DIAGNOSIS — N186 End stage renal disease: Secondary | ICD-10-CM | POA: Diagnosis not present

## 2018-11-16 DIAGNOSIS — D631 Anemia in chronic kidney disease: Secondary | ICD-10-CM | POA: Diagnosis not present

## 2018-11-18 DIAGNOSIS — N2581 Secondary hyperparathyroidism of renal origin: Secondary | ICD-10-CM | POA: Diagnosis not present

## 2018-11-18 DIAGNOSIS — N186 End stage renal disease: Secondary | ICD-10-CM | POA: Diagnosis not present

## 2018-11-18 DIAGNOSIS — D509 Iron deficiency anemia, unspecified: Secondary | ICD-10-CM | POA: Diagnosis not present

## 2018-11-18 DIAGNOSIS — D631 Anemia in chronic kidney disease: Secondary | ICD-10-CM | POA: Diagnosis not present

## 2018-11-19 DIAGNOSIS — D631 Anemia in chronic kidney disease: Secondary | ICD-10-CM | POA: Diagnosis not present

## 2018-11-19 DIAGNOSIS — N186 End stage renal disease: Secondary | ICD-10-CM | POA: Diagnosis not present

## 2018-11-19 DIAGNOSIS — N2581 Secondary hyperparathyroidism of renal origin: Secondary | ICD-10-CM | POA: Diagnosis not present

## 2018-11-19 DIAGNOSIS — D509 Iron deficiency anemia, unspecified: Secondary | ICD-10-CM | POA: Diagnosis not present

## 2018-11-22 DIAGNOSIS — D631 Anemia in chronic kidney disease: Secondary | ICD-10-CM | POA: Diagnosis not present

## 2018-11-22 DIAGNOSIS — N186 End stage renal disease: Secondary | ICD-10-CM | POA: Diagnosis not present

## 2018-11-22 DIAGNOSIS — D509 Iron deficiency anemia, unspecified: Secondary | ICD-10-CM | POA: Diagnosis not present

## 2018-11-22 DIAGNOSIS — N2581 Secondary hyperparathyroidism of renal origin: Secondary | ICD-10-CM | POA: Diagnosis not present

## 2018-11-24 DIAGNOSIS — N186 End stage renal disease: Secondary | ICD-10-CM | POA: Diagnosis not present

## 2018-11-24 DIAGNOSIS — D509 Iron deficiency anemia, unspecified: Secondary | ICD-10-CM | POA: Diagnosis not present

## 2018-11-24 DIAGNOSIS — D631 Anemia in chronic kidney disease: Secondary | ICD-10-CM | POA: Diagnosis not present

## 2018-11-24 DIAGNOSIS — N2581 Secondary hyperparathyroidism of renal origin: Secondary | ICD-10-CM | POA: Diagnosis not present

## 2018-11-26 DIAGNOSIS — N186 End stage renal disease: Secondary | ICD-10-CM | POA: Diagnosis not present

## 2018-11-26 DIAGNOSIS — D509 Iron deficiency anemia, unspecified: Secondary | ICD-10-CM | POA: Diagnosis not present

## 2018-11-26 DIAGNOSIS — D631 Anemia in chronic kidney disease: Secondary | ICD-10-CM | POA: Diagnosis not present

## 2018-11-26 DIAGNOSIS — N2581 Secondary hyperparathyroidism of renal origin: Secondary | ICD-10-CM | POA: Diagnosis not present

## 2018-11-28 DIAGNOSIS — Z992 Dependence on renal dialysis: Secondary | ICD-10-CM | POA: Diagnosis not present

## 2018-11-28 DIAGNOSIS — N186 End stage renal disease: Secondary | ICD-10-CM | POA: Diagnosis not present

## 2018-11-28 DIAGNOSIS — N033 Chronic nephritic syndrome with diffuse mesangial proliferative glomerulonephritis: Secondary | ICD-10-CM | POA: Diagnosis not present

## 2018-11-29 DIAGNOSIS — N2581 Secondary hyperparathyroidism of renal origin: Secondary | ICD-10-CM | POA: Diagnosis not present

## 2018-11-29 DIAGNOSIS — D509 Iron deficiency anemia, unspecified: Secondary | ICD-10-CM | POA: Diagnosis not present

## 2018-11-29 DIAGNOSIS — N186 End stage renal disease: Secondary | ICD-10-CM | POA: Diagnosis not present

## 2018-11-29 DIAGNOSIS — D631 Anemia in chronic kidney disease: Secondary | ICD-10-CM | POA: Diagnosis not present

## 2018-12-01 DIAGNOSIS — N2581 Secondary hyperparathyroidism of renal origin: Secondary | ICD-10-CM | POA: Diagnosis not present

## 2018-12-01 DIAGNOSIS — N186 End stage renal disease: Secondary | ICD-10-CM | POA: Diagnosis not present

## 2018-12-01 DIAGNOSIS — D631 Anemia in chronic kidney disease: Secondary | ICD-10-CM | POA: Diagnosis not present

## 2018-12-01 DIAGNOSIS — D509 Iron deficiency anemia, unspecified: Secondary | ICD-10-CM | POA: Diagnosis not present

## 2018-12-03 DIAGNOSIS — N2581 Secondary hyperparathyroidism of renal origin: Secondary | ICD-10-CM | POA: Diagnosis not present

## 2018-12-03 DIAGNOSIS — N186 End stage renal disease: Secondary | ICD-10-CM | POA: Diagnosis not present

## 2018-12-03 DIAGNOSIS — D509 Iron deficiency anemia, unspecified: Secondary | ICD-10-CM | POA: Diagnosis not present

## 2018-12-03 DIAGNOSIS — D631 Anemia in chronic kidney disease: Secondary | ICD-10-CM | POA: Diagnosis not present

## 2018-12-06 DIAGNOSIS — D631 Anemia in chronic kidney disease: Secondary | ICD-10-CM | POA: Diagnosis not present

## 2018-12-06 DIAGNOSIS — N186 End stage renal disease: Secondary | ICD-10-CM | POA: Diagnosis not present

## 2018-12-06 DIAGNOSIS — D509 Iron deficiency anemia, unspecified: Secondary | ICD-10-CM | POA: Diagnosis not present

## 2018-12-06 DIAGNOSIS — N2581 Secondary hyperparathyroidism of renal origin: Secondary | ICD-10-CM | POA: Diagnosis not present

## 2018-12-08 DIAGNOSIS — N186 End stage renal disease: Secondary | ICD-10-CM | POA: Diagnosis not present

## 2018-12-08 DIAGNOSIS — D509 Iron deficiency anemia, unspecified: Secondary | ICD-10-CM | POA: Diagnosis not present

## 2018-12-08 DIAGNOSIS — D631 Anemia in chronic kidney disease: Secondary | ICD-10-CM | POA: Diagnosis not present

## 2018-12-08 DIAGNOSIS — N2581 Secondary hyperparathyroidism of renal origin: Secondary | ICD-10-CM | POA: Diagnosis not present

## 2018-12-10 DIAGNOSIS — N186 End stage renal disease: Secondary | ICD-10-CM | POA: Diagnosis not present

## 2018-12-10 DIAGNOSIS — N2581 Secondary hyperparathyroidism of renal origin: Secondary | ICD-10-CM | POA: Diagnosis not present

## 2018-12-10 DIAGNOSIS — D509 Iron deficiency anemia, unspecified: Secondary | ICD-10-CM | POA: Diagnosis not present

## 2018-12-10 DIAGNOSIS — D631 Anemia in chronic kidney disease: Secondary | ICD-10-CM | POA: Diagnosis not present

## 2018-12-13 DIAGNOSIS — N186 End stage renal disease: Secondary | ICD-10-CM | POA: Diagnosis not present

## 2018-12-13 DIAGNOSIS — D509 Iron deficiency anemia, unspecified: Secondary | ICD-10-CM | POA: Diagnosis not present

## 2018-12-13 DIAGNOSIS — N2581 Secondary hyperparathyroidism of renal origin: Secondary | ICD-10-CM | POA: Diagnosis not present

## 2018-12-13 DIAGNOSIS — D631 Anemia in chronic kidney disease: Secondary | ICD-10-CM | POA: Diagnosis not present

## 2018-12-15 DIAGNOSIS — D509 Iron deficiency anemia, unspecified: Secondary | ICD-10-CM | POA: Diagnosis not present

## 2018-12-15 DIAGNOSIS — N186 End stage renal disease: Secondary | ICD-10-CM | POA: Diagnosis not present

## 2018-12-15 DIAGNOSIS — N2581 Secondary hyperparathyroidism of renal origin: Secondary | ICD-10-CM | POA: Diagnosis not present

## 2018-12-15 DIAGNOSIS — D631 Anemia in chronic kidney disease: Secondary | ICD-10-CM | POA: Diagnosis not present

## 2018-12-17 DIAGNOSIS — D509 Iron deficiency anemia, unspecified: Secondary | ICD-10-CM | POA: Diagnosis not present

## 2018-12-17 DIAGNOSIS — D631 Anemia in chronic kidney disease: Secondary | ICD-10-CM | POA: Diagnosis not present

## 2018-12-17 DIAGNOSIS — N2581 Secondary hyperparathyroidism of renal origin: Secondary | ICD-10-CM | POA: Diagnosis not present

## 2018-12-17 DIAGNOSIS — N186 End stage renal disease: Secondary | ICD-10-CM | POA: Diagnosis not present

## 2018-12-20 DIAGNOSIS — N2581 Secondary hyperparathyroidism of renal origin: Secondary | ICD-10-CM | POA: Diagnosis not present

## 2018-12-20 DIAGNOSIS — N186 End stage renal disease: Secondary | ICD-10-CM | POA: Diagnosis not present

## 2018-12-20 DIAGNOSIS — D631 Anemia in chronic kidney disease: Secondary | ICD-10-CM | POA: Diagnosis not present

## 2018-12-20 DIAGNOSIS — D509 Iron deficiency anemia, unspecified: Secondary | ICD-10-CM | POA: Diagnosis not present

## 2018-12-22 DIAGNOSIS — N186 End stage renal disease: Secondary | ICD-10-CM | POA: Diagnosis not present

## 2018-12-22 DIAGNOSIS — D509 Iron deficiency anemia, unspecified: Secondary | ICD-10-CM | POA: Diagnosis not present

## 2018-12-22 DIAGNOSIS — D631 Anemia in chronic kidney disease: Secondary | ICD-10-CM | POA: Diagnosis not present

## 2018-12-22 DIAGNOSIS — N2581 Secondary hyperparathyroidism of renal origin: Secondary | ICD-10-CM | POA: Diagnosis not present

## 2018-12-24 DIAGNOSIS — N2581 Secondary hyperparathyroidism of renal origin: Secondary | ICD-10-CM | POA: Diagnosis not present

## 2018-12-24 DIAGNOSIS — N186 End stage renal disease: Secondary | ICD-10-CM | POA: Diagnosis not present

## 2018-12-24 DIAGNOSIS — D509 Iron deficiency anemia, unspecified: Secondary | ICD-10-CM | POA: Diagnosis not present

## 2018-12-24 DIAGNOSIS — D631 Anemia in chronic kidney disease: Secondary | ICD-10-CM | POA: Diagnosis not present

## 2018-12-27 DIAGNOSIS — D509 Iron deficiency anemia, unspecified: Secondary | ICD-10-CM | POA: Diagnosis not present

## 2018-12-27 DIAGNOSIS — N186 End stage renal disease: Secondary | ICD-10-CM | POA: Diagnosis not present

## 2018-12-27 DIAGNOSIS — D631 Anemia in chronic kidney disease: Secondary | ICD-10-CM | POA: Diagnosis not present

## 2018-12-27 DIAGNOSIS — N2581 Secondary hyperparathyroidism of renal origin: Secondary | ICD-10-CM | POA: Diagnosis not present

## 2018-12-29 DIAGNOSIS — D509 Iron deficiency anemia, unspecified: Secondary | ICD-10-CM | POA: Diagnosis not present

## 2018-12-29 DIAGNOSIS — Z992 Dependence on renal dialysis: Secondary | ICD-10-CM | POA: Diagnosis not present

## 2018-12-29 DIAGNOSIS — D631 Anemia in chronic kidney disease: Secondary | ICD-10-CM | POA: Diagnosis not present

## 2018-12-29 DIAGNOSIS — N186 End stage renal disease: Secondary | ICD-10-CM | POA: Diagnosis not present

## 2018-12-29 DIAGNOSIS — N033 Chronic nephritic syndrome with diffuse mesangial proliferative glomerulonephritis: Secondary | ICD-10-CM | POA: Diagnosis not present

## 2018-12-29 DIAGNOSIS — N2581 Secondary hyperparathyroidism of renal origin: Secondary | ICD-10-CM | POA: Diagnosis not present

## 2018-12-31 DIAGNOSIS — N2581 Secondary hyperparathyroidism of renal origin: Secondary | ICD-10-CM | POA: Diagnosis not present

## 2018-12-31 DIAGNOSIS — D631 Anemia in chronic kidney disease: Secondary | ICD-10-CM | POA: Diagnosis not present

## 2018-12-31 DIAGNOSIS — D509 Iron deficiency anemia, unspecified: Secondary | ICD-10-CM | POA: Diagnosis not present

## 2018-12-31 DIAGNOSIS — N186 End stage renal disease: Secondary | ICD-10-CM | POA: Diagnosis not present

## 2019-01-03 DIAGNOSIS — N186 End stage renal disease: Secondary | ICD-10-CM | POA: Diagnosis not present

## 2019-01-03 DIAGNOSIS — N2581 Secondary hyperparathyroidism of renal origin: Secondary | ICD-10-CM | POA: Diagnosis not present

## 2019-01-03 DIAGNOSIS — D509 Iron deficiency anemia, unspecified: Secondary | ICD-10-CM | POA: Diagnosis not present

## 2019-01-03 DIAGNOSIS — D631 Anemia in chronic kidney disease: Secondary | ICD-10-CM | POA: Diagnosis not present

## 2019-01-05 DIAGNOSIS — D509 Iron deficiency anemia, unspecified: Secondary | ICD-10-CM | POA: Diagnosis not present

## 2019-01-05 DIAGNOSIS — D631 Anemia in chronic kidney disease: Secondary | ICD-10-CM | POA: Diagnosis not present

## 2019-01-05 DIAGNOSIS — N2581 Secondary hyperparathyroidism of renal origin: Secondary | ICD-10-CM | POA: Diagnosis not present

## 2019-01-05 DIAGNOSIS — N186 End stage renal disease: Secondary | ICD-10-CM | POA: Diagnosis not present

## 2019-01-06 DIAGNOSIS — N186 End stage renal disease: Secondary | ICD-10-CM | POA: Diagnosis not present

## 2019-01-06 DIAGNOSIS — D509 Iron deficiency anemia, unspecified: Secondary | ICD-10-CM | POA: Diagnosis not present

## 2019-01-06 DIAGNOSIS — N2581 Secondary hyperparathyroidism of renal origin: Secondary | ICD-10-CM | POA: Diagnosis not present

## 2019-01-06 DIAGNOSIS — D631 Anemia in chronic kidney disease: Secondary | ICD-10-CM | POA: Diagnosis not present

## 2019-01-10 DIAGNOSIS — D631 Anemia in chronic kidney disease: Secondary | ICD-10-CM | POA: Diagnosis not present

## 2019-01-10 DIAGNOSIS — N2581 Secondary hyperparathyroidism of renal origin: Secondary | ICD-10-CM | POA: Diagnosis not present

## 2019-01-10 DIAGNOSIS — D509 Iron deficiency anemia, unspecified: Secondary | ICD-10-CM | POA: Diagnosis not present

## 2019-01-10 DIAGNOSIS — N186 End stage renal disease: Secondary | ICD-10-CM | POA: Diagnosis not present

## 2019-01-12 DIAGNOSIS — D509 Iron deficiency anemia, unspecified: Secondary | ICD-10-CM | POA: Diagnosis not present

## 2019-01-12 DIAGNOSIS — N2581 Secondary hyperparathyroidism of renal origin: Secondary | ICD-10-CM | POA: Diagnosis not present

## 2019-01-12 DIAGNOSIS — D631 Anemia in chronic kidney disease: Secondary | ICD-10-CM | POA: Diagnosis not present

## 2019-01-12 DIAGNOSIS — N186 End stage renal disease: Secondary | ICD-10-CM | POA: Diagnosis not present

## 2019-01-14 DIAGNOSIS — N2581 Secondary hyperparathyroidism of renal origin: Secondary | ICD-10-CM | POA: Diagnosis not present

## 2019-01-14 DIAGNOSIS — D509 Iron deficiency anemia, unspecified: Secondary | ICD-10-CM | POA: Diagnosis not present

## 2019-01-14 DIAGNOSIS — D631 Anemia in chronic kidney disease: Secondary | ICD-10-CM | POA: Diagnosis not present

## 2019-01-14 DIAGNOSIS — N186 End stage renal disease: Secondary | ICD-10-CM | POA: Diagnosis not present

## 2019-01-17 DIAGNOSIS — D631 Anemia in chronic kidney disease: Secondary | ICD-10-CM | POA: Diagnosis not present

## 2019-01-17 DIAGNOSIS — D509 Iron deficiency anemia, unspecified: Secondary | ICD-10-CM | POA: Diagnosis not present

## 2019-01-17 DIAGNOSIS — N2581 Secondary hyperparathyroidism of renal origin: Secondary | ICD-10-CM | POA: Diagnosis not present

## 2019-01-17 DIAGNOSIS — N186 End stage renal disease: Secondary | ICD-10-CM | POA: Diagnosis not present

## 2019-01-19 DIAGNOSIS — N2581 Secondary hyperparathyroidism of renal origin: Secondary | ICD-10-CM | POA: Diagnosis not present

## 2019-01-19 DIAGNOSIS — D509 Iron deficiency anemia, unspecified: Secondary | ICD-10-CM | POA: Diagnosis not present

## 2019-01-19 DIAGNOSIS — D631 Anemia in chronic kidney disease: Secondary | ICD-10-CM | POA: Diagnosis not present

## 2019-01-19 DIAGNOSIS — N186 End stage renal disease: Secondary | ICD-10-CM | POA: Diagnosis not present

## 2019-01-21 DIAGNOSIS — N186 End stage renal disease: Secondary | ICD-10-CM | POA: Diagnosis not present

## 2019-01-21 DIAGNOSIS — D509 Iron deficiency anemia, unspecified: Secondary | ICD-10-CM | POA: Diagnosis not present

## 2019-01-21 DIAGNOSIS — N2581 Secondary hyperparathyroidism of renal origin: Secondary | ICD-10-CM | POA: Diagnosis not present

## 2019-01-21 DIAGNOSIS — D631 Anemia in chronic kidney disease: Secondary | ICD-10-CM | POA: Diagnosis not present

## 2019-01-24 DIAGNOSIS — N186 End stage renal disease: Secondary | ICD-10-CM | POA: Diagnosis not present

## 2019-01-24 DIAGNOSIS — N2581 Secondary hyperparathyroidism of renal origin: Secondary | ICD-10-CM | POA: Diagnosis not present

## 2019-01-24 DIAGNOSIS — D509 Iron deficiency anemia, unspecified: Secondary | ICD-10-CM | POA: Diagnosis not present

## 2019-01-24 DIAGNOSIS — D631 Anemia in chronic kidney disease: Secondary | ICD-10-CM | POA: Diagnosis not present

## 2019-01-26 DIAGNOSIS — D631 Anemia in chronic kidney disease: Secondary | ICD-10-CM | POA: Diagnosis not present

## 2019-01-26 DIAGNOSIS — N2581 Secondary hyperparathyroidism of renal origin: Secondary | ICD-10-CM | POA: Diagnosis not present

## 2019-01-26 DIAGNOSIS — D509 Iron deficiency anemia, unspecified: Secondary | ICD-10-CM | POA: Diagnosis not present

## 2019-01-26 DIAGNOSIS — N186 End stage renal disease: Secondary | ICD-10-CM | POA: Diagnosis not present

## 2019-01-28 DIAGNOSIS — D509 Iron deficiency anemia, unspecified: Secondary | ICD-10-CM | POA: Diagnosis not present

## 2019-01-28 DIAGNOSIS — D631 Anemia in chronic kidney disease: Secondary | ICD-10-CM | POA: Diagnosis not present

## 2019-01-28 DIAGNOSIS — N033 Chronic nephritic syndrome with diffuse mesangial proliferative glomerulonephritis: Secondary | ICD-10-CM | POA: Diagnosis not present

## 2019-01-28 DIAGNOSIS — N2581 Secondary hyperparathyroidism of renal origin: Secondary | ICD-10-CM | POA: Diagnosis not present

## 2019-01-28 DIAGNOSIS — N186 End stage renal disease: Secondary | ICD-10-CM | POA: Diagnosis not present

## 2019-01-28 DIAGNOSIS — Z992 Dependence on renal dialysis: Secondary | ICD-10-CM | POA: Diagnosis not present

## 2019-01-31 DIAGNOSIS — N186 End stage renal disease: Secondary | ICD-10-CM | POA: Diagnosis not present

## 2019-01-31 DIAGNOSIS — N2581 Secondary hyperparathyroidism of renal origin: Secondary | ICD-10-CM | POA: Diagnosis not present

## 2019-01-31 DIAGNOSIS — D631 Anemia in chronic kidney disease: Secondary | ICD-10-CM | POA: Diagnosis not present

## 2019-01-31 DIAGNOSIS — D509 Iron deficiency anemia, unspecified: Secondary | ICD-10-CM | POA: Diagnosis not present

## 2019-02-02 DIAGNOSIS — D509 Iron deficiency anemia, unspecified: Secondary | ICD-10-CM | POA: Diagnosis not present

## 2019-02-02 DIAGNOSIS — N2581 Secondary hyperparathyroidism of renal origin: Secondary | ICD-10-CM | POA: Diagnosis not present

## 2019-02-02 DIAGNOSIS — D631 Anemia in chronic kidney disease: Secondary | ICD-10-CM | POA: Diagnosis not present

## 2019-02-02 DIAGNOSIS — N186 End stage renal disease: Secondary | ICD-10-CM | POA: Diagnosis not present

## 2019-02-04 DIAGNOSIS — N2581 Secondary hyperparathyroidism of renal origin: Secondary | ICD-10-CM | POA: Diagnosis not present

## 2019-02-04 DIAGNOSIS — D509 Iron deficiency anemia, unspecified: Secondary | ICD-10-CM | POA: Diagnosis not present

## 2019-02-04 DIAGNOSIS — N186 End stage renal disease: Secondary | ICD-10-CM | POA: Diagnosis not present

## 2019-02-04 DIAGNOSIS — D631 Anemia in chronic kidney disease: Secondary | ICD-10-CM | POA: Diagnosis not present

## 2019-02-07 DIAGNOSIS — D631 Anemia in chronic kidney disease: Secondary | ICD-10-CM | POA: Diagnosis not present

## 2019-02-07 DIAGNOSIS — D509 Iron deficiency anemia, unspecified: Secondary | ICD-10-CM | POA: Diagnosis not present

## 2019-02-07 DIAGNOSIS — N2581 Secondary hyperparathyroidism of renal origin: Secondary | ICD-10-CM | POA: Diagnosis not present

## 2019-02-07 DIAGNOSIS — N186 End stage renal disease: Secondary | ICD-10-CM | POA: Diagnosis not present

## 2019-02-09 DIAGNOSIS — D631 Anemia in chronic kidney disease: Secondary | ICD-10-CM | POA: Diagnosis not present

## 2019-02-09 DIAGNOSIS — N2581 Secondary hyperparathyroidism of renal origin: Secondary | ICD-10-CM | POA: Diagnosis not present

## 2019-02-09 DIAGNOSIS — D509 Iron deficiency anemia, unspecified: Secondary | ICD-10-CM | POA: Diagnosis not present

## 2019-02-09 DIAGNOSIS — N186 End stage renal disease: Secondary | ICD-10-CM | POA: Diagnosis not present

## 2019-02-11 DIAGNOSIS — D631 Anemia in chronic kidney disease: Secondary | ICD-10-CM | POA: Diagnosis not present

## 2019-02-11 DIAGNOSIS — D509 Iron deficiency anemia, unspecified: Secondary | ICD-10-CM | POA: Diagnosis not present

## 2019-02-11 DIAGNOSIS — N2581 Secondary hyperparathyroidism of renal origin: Secondary | ICD-10-CM | POA: Diagnosis not present

## 2019-02-11 DIAGNOSIS — N186 End stage renal disease: Secondary | ICD-10-CM | POA: Diagnosis not present

## 2019-02-14 DIAGNOSIS — N2581 Secondary hyperparathyroidism of renal origin: Secondary | ICD-10-CM | POA: Diagnosis not present

## 2019-02-14 DIAGNOSIS — N186 End stage renal disease: Secondary | ICD-10-CM | POA: Diagnosis not present

## 2019-02-14 DIAGNOSIS — D509 Iron deficiency anemia, unspecified: Secondary | ICD-10-CM | POA: Diagnosis not present

## 2019-02-14 DIAGNOSIS — D631 Anemia in chronic kidney disease: Secondary | ICD-10-CM | POA: Diagnosis not present

## 2019-02-16 DIAGNOSIS — D509 Iron deficiency anemia, unspecified: Secondary | ICD-10-CM | POA: Diagnosis not present

## 2019-02-16 DIAGNOSIS — N2581 Secondary hyperparathyroidism of renal origin: Secondary | ICD-10-CM | POA: Diagnosis not present

## 2019-02-16 DIAGNOSIS — D631 Anemia in chronic kidney disease: Secondary | ICD-10-CM | POA: Diagnosis not present

## 2019-02-16 DIAGNOSIS — N186 End stage renal disease: Secondary | ICD-10-CM | POA: Diagnosis not present

## 2019-02-18 DIAGNOSIS — N186 End stage renal disease: Secondary | ICD-10-CM | POA: Diagnosis not present

## 2019-02-18 DIAGNOSIS — N2581 Secondary hyperparathyroidism of renal origin: Secondary | ICD-10-CM | POA: Diagnosis not present

## 2019-02-18 DIAGNOSIS — D631 Anemia in chronic kidney disease: Secondary | ICD-10-CM | POA: Diagnosis not present

## 2019-02-18 DIAGNOSIS — D509 Iron deficiency anemia, unspecified: Secondary | ICD-10-CM | POA: Diagnosis not present

## 2019-02-21 DIAGNOSIS — N186 End stage renal disease: Secondary | ICD-10-CM | POA: Diagnosis not present

## 2019-02-21 DIAGNOSIS — D509 Iron deficiency anemia, unspecified: Secondary | ICD-10-CM | POA: Diagnosis not present

## 2019-02-21 DIAGNOSIS — D631 Anemia in chronic kidney disease: Secondary | ICD-10-CM | POA: Diagnosis not present

## 2019-02-21 DIAGNOSIS — N2581 Secondary hyperparathyroidism of renal origin: Secondary | ICD-10-CM | POA: Diagnosis not present

## 2019-02-23 DIAGNOSIS — N186 End stage renal disease: Secondary | ICD-10-CM | POA: Diagnosis not present

## 2019-02-23 DIAGNOSIS — D509 Iron deficiency anemia, unspecified: Secondary | ICD-10-CM | POA: Diagnosis not present

## 2019-02-23 DIAGNOSIS — N2581 Secondary hyperparathyroidism of renal origin: Secondary | ICD-10-CM | POA: Diagnosis not present

## 2019-02-23 DIAGNOSIS — D631 Anemia in chronic kidney disease: Secondary | ICD-10-CM | POA: Diagnosis not present

## 2019-02-25 DIAGNOSIS — D509 Iron deficiency anemia, unspecified: Secondary | ICD-10-CM | POA: Diagnosis not present

## 2019-02-25 DIAGNOSIS — N2581 Secondary hyperparathyroidism of renal origin: Secondary | ICD-10-CM | POA: Diagnosis not present

## 2019-02-25 DIAGNOSIS — D631 Anemia in chronic kidney disease: Secondary | ICD-10-CM | POA: Diagnosis not present

## 2019-02-25 DIAGNOSIS — N186 End stage renal disease: Secondary | ICD-10-CM | POA: Diagnosis not present

## 2019-02-28 DIAGNOSIS — D631 Anemia in chronic kidney disease: Secondary | ICD-10-CM | POA: Diagnosis not present

## 2019-02-28 DIAGNOSIS — N186 End stage renal disease: Secondary | ICD-10-CM | POA: Diagnosis not present

## 2019-02-28 DIAGNOSIS — Z992 Dependence on renal dialysis: Secondary | ICD-10-CM | POA: Diagnosis not present

## 2019-02-28 DIAGNOSIS — N2581 Secondary hyperparathyroidism of renal origin: Secondary | ICD-10-CM | POA: Diagnosis not present

## 2019-02-28 DIAGNOSIS — D509 Iron deficiency anemia, unspecified: Secondary | ICD-10-CM | POA: Diagnosis not present

## 2019-02-28 DIAGNOSIS — N033 Chronic nephritic syndrome with diffuse mesangial proliferative glomerulonephritis: Secondary | ICD-10-CM | POA: Diagnosis not present

## 2019-03-02 DIAGNOSIS — D509 Iron deficiency anemia, unspecified: Secondary | ICD-10-CM | POA: Diagnosis not present

## 2019-03-02 DIAGNOSIS — N186 End stage renal disease: Secondary | ICD-10-CM | POA: Diagnosis not present

## 2019-03-02 DIAGNOSIS — N2581 Secondary hyperparathyroidism of renal origin: Secondary | ICD-10-CM | POA: Diagnosis not present

## 2019-03-02 DIAGNOSIS — D631 Anemia in chronic kidney disease: Secondary | ICD-10-CM | POA: Diagnosis not present

## 2019-03-04 DIAGNOSIS — N2581 Secondary hyperparathyroidism of renal origin: Secondary | ICD-10-CM | POA: Diagnosis not present

## 2019-03-04 DIAGNOSIS — D509 Iron deficiency anemia, unspecified: Secondary | ICD-10-CM | POA: Diagnosis not present

## 2019-03-04 DIAGNOSIS — N186 End stage renal disease: Secondary | ICD-10-CM | POA: Diagnosis not present

## 2019-03-04 DIAGNOSIS — D631 Anemia in chronic kidney disease: Secondary | ICD-10-CM | POA: Diagnosis not present

## 2019-03-07 DIAGNOSIS — N2581 Secondary hyperparathyroidism of renal origin: Secondary | ICD-10-CM | POA: Diagnosis not present

## 2019-03-07 DIAGNOSIS — D631 Anemia in chronic kidney disease: Secondary | ICD-10-CM | POA: Diagnosis not present

## 2019-03-07 DIAGNOSIS — D509 Iron deficiency anemia, unspecified: Secondary | ICD-10-CM | POA: Diagnosis not present

## 2019-03-07 DIAGNOSIS — N186 End stage renal disease: Secondary | ICD-10-CM | POA: Diagnosis not present

## 2019-03-09 DIAGNOSIS — N186 End stage renal disease: Secondary | ICD-10-CM | POA: Diagnosis not present

## 2019-03-09 DIAGNOSIS — D631 Anemia in chronic kidney disease: Secondary | ICD-10-CM | POA: Diagnosis not present

## 2019-03-09 DIAGNOSIS — N2581 Secondary hyperparathyroidism of renal origin: Secondary | ICD-10-CM | POA: Diagnosis not present

## 2019-03-09 DIAGNOSIS — D509 Iron deficiency anemia, unspecified: Secondary | ICD-10-CM | POA: Diagnosis not present

## 2019-03-11 DIAGNOSIS — D509 Iron deficiency anemia, unspecified: Secondary | ICD-10-CM | POA: Diagnosis not present

## 2019-03-11 DIAGNOSIS — N186 End stage renal disease: Secondary | ICD-10-CM | POA: Diagnosis not present

## 2019-03-11 DIAGNOSIS — N2581 Secondary hyperparathyroidism of renal origin: Secondary | ICD-10-CM | POA: Diagnosis not present

## 2019-03-11 DIAGNOSIS — D631 Anemia in chronic kidney disease: Secondary | ICD-10-CM | POA: Diagnosis not present

## 2019-03-14 DIAGNOSIS — D509 Iron deficiency anemia, unspecified: Secondary | ICD-10-CM | POA: Diagnosis not present

## 2019-03-14 DIAGNOSIS — N186 End stage renal disease: Secondary | ICD-10-CM | POA: Diagnosis not present

## 2019-03-14 DIAGNOSIS — N2581 Secondary hyperparathyroidism of renal origin: Secondary | ICD-10-CM | POA: Diagnosis not present

## 2019-03-14 DIAGNOSIS — D631 Anemia in chronic kidney disease: Secondary | ICD-10-CM | POA: Diagnosis not present

## 2019-03-16 DIAGNOSIS — D509 Iron deficiency anemia, unspecified: Secondary | ICD-10-CM | POA: Diagnosis not present

## 2019-03-16 DIAGNOSIS — N2581 Secondary hyperparathyroidism of renal origin: Secondary | ICD-10-CM | POA: Diagnosis not present

## 2019-03-16 DIAGNOSIS — N186 End stage renal disease: Secondary | ICD-10-CM | POA: Diagnosis not present

## 2019-03-16 DIAGNOSIS — D631 Anemia in chronic kidney disease: Secondary | ICD-10-CM | POA: Diagnosis not present

## 2019-03-18 DIAGNOSIS — D631 Anemia in chronic kidney disease: Secondary | ICD-10-CM | POA: Diagnosis not present

## 2019-03-18 DIAGNOSIS — D509 Iron deficiency anemia, unspecified: Secondary | ICD-10-CM | POA: Diagnosis not present

## 2019-03-18 DIAGNOSIS — N2581 Secondary hyperparathyroidism of renal origin: Secondary | ICD-10-CM | POA: Diagnosis not present

## 2019-03-18 DIAGNOSIS — N186 End stage renal disease: Secondary | ICD-10-CM | POA: Diagnosis not present

## 2019-03-20 ENCOUNTER — Emergency Department (HOSPITAL_COMMUNITY)
Admission: EM | Admit: 2019-03-20 | Discharge: 2019-03-20 | Disposition: A | Discharge status: Home / Self Care | Payer: Medicare Other | Attending: Emergency Medicine | Admitting: Emergency Medicine

## 2019-03-20 ENCOUNTER — Emergency Department (HOSPITAL_COMMUNITY): Payer: Medicare Other

## 2019-03-20 ENCOUNTER — Encounter (HOSPITAL_COMMUNITY): Payer: Self-pay

## 2019-03-20 ENCOUNTER — Other Ambulatory Visit: Payer: Self-pay

## 2019-03-20 DIAGNOSIS — I5022 Chronic systolic (congestive) heart failure: Secondary | ICD-10-CM | POA: Diagnosis not present

## 2019-03-20 DIAGNOSIS — I132 Hypertensive heart and chronic kidney disease with heart failure and with stage 5 chronic kidney disease, or end stage renal disease: Secondary | ICD-10-CM | POA: Diagnosis not present

## 2019-03-20 DIAGNOSIS — Z20828 Contact with and (suspected) exposure to other viral communicable diseases: Secondary | ICD-10-CM | POA: Insufficient documentation

## 2019-03-20 DIAGNOSIS — Y939 Activity, unspecified: Secondary | ICD-10-CM | POA: Diagnosis not present

## 2019-03-20 DIAGNOSIS — S0081XA Abrasion of other part of head, initial encounter: Secondary | ICD-10-CM | POA: Diagnosis not present

## 2019-03-20 DIAGNOSIS — Y990 Civilian activity done for income or pay: Secondary | ICD-10-CM | POA: Insufficient documentation

## 2019-03-20 DIAGNOSIS — Z7722 Contact with and (suspected) exposure to environmental tobacco smoke (acute) (chronic): Secondary | ICD-10-CM | POA: Insufficient documentation

## 2019-03-20 DIAGNOSIS — W19XXXA Unspecified fall, initial encounter: Secondary | ICD-10-CM | POA: Diagnosis not present

## 2019-03-20 DIAGNOSIS — Y929 Unspecified place or not applicable: Secondary | ICD-10-CM | POA: Diagnosis not present

## 2019-03-20 DIAGNOSIS — N186 End stage renal disease: Secondary | ICD-10-CM | POA: Insufficient documentation

## 2019-03-20 DIAGNOSIS — T07XXXA Unspecified multiple injuries, initial encounter: Secondary | ICD-10-CM | POA: Diagnosis not present

## 2019-03-20 DIAGNOSIS — R220 Localized swelling, mass and lump, head: Secondary | ICD-10-CM | POA: Diagnosis not present

## 2019-03-20 DIAGNOSIS — Z992 Dependence on renal dialysis: Secondary | ICD-10-CM | POA: Insufficient documentation

## 2019-03-20 DIAGNOSIS — Z03818 Encounter for observation for suspected exposure to other biological agents ruled out: Secondary | ICD-10-CM | POA: Diagnosis not present

## 2019-03-20 MED ORDER — OXYMETAZOLINE HCL 0.05 % NA SOLN
1.0000 | Freq: Once | NASAL | Status: AC
  Administered 2019-03-20: 1 via NASAL
  Filled 2019-03-20: qty 30

## 2019-03-20 NOTE — ED Triage Notes (Signed)
Patient had dialysis treatment this am and fell several hours later at work. States that he fell on face. Swelling with abrasion to forehead, nose and mouth. Alert and orinted on arrival, unsure if LOC

## 2019-03-20 NOTE — ED Provider Notes (Signed)
[facility] EMERGENCY DEPARTMENT Provider Note   CSN: [id number] Arrival date & time: 03/20/19  1614    History   Chief Complaint Chief Complaint  Patient presents with  . Fall    HPI Christopher Wang is a 33 y.o. male.     The history is provided by the patient and medical records.  Fall This is a new problem. The current episode started 2 days ago. The problem occurs constantly. The problem has been gradually improving. Pertinent negatives include no chest pain, no abdominal pain, no headaches and no shortness of breath. Nothing aggravates the symptoms. The symptoms are relieved by ice, acetaminophen, relaxation, rest and sleep. He has tried acetaminophen, rest and a cold compress for the symptoms. The treatment provided moderate relief.    Past Medical History:  Diagnosis Date  . Acute on chronic renal insufficiency 07/15/2013  . Anemia   . Edema of lower extremity 08/06/2011  . Enteritis 07/15/2013  . ESRD (end stage renal disease) on dialysis [facility])    "[employer]; MWF" (07/15/2018)  . Headache   . Hypertension 08/09/2011  . Nephrotic syndrome   . Scrotal edema 08/06/2011  . Syncope 08/03/2013  . UTI (urinary tract infection)     Patient Active Problem List   Diagnosis Date Noted  . Hyperkalemia   . ESRD needing dialysis ([place]) 11/03/2018  . ESRD (end stage renal disease) ([place]) 07/15/2018  . Hyperparathyroidism ([place]) 11/06/2017  . Systolic heart failure, chronic ([place]) 11/06/2017  . Hypotension of hemodialysis 08/03/2013  . Anemia in chronic kidney disease 07/16/2013  . Uremia 07/15/2013  . ESRD on dialysis ([place]) 07/08/2013  . FSGS (focal segmental glomerulosclerosis) with nephrosis 08/15/2011  . Hypertension 08/09/2011  . Hyperlipidemia 08/07/2011  . Nephrotic syndrome 08/06/2011  . Serum albumin decreased 08/06/2011  . Proteinuria 08/06/2011  . Hypokalemia 08/06/2011    Past Surgical History:  Procedure Laterality Date  . AV FISTULA  PLACEMENT Left 07/18/2013   Procedure: ARTERIOVENOUS (AV) FISTULA CREATION ;  Surgeon: Rosetta Posner, MD;  Location: [place];  Service: Vascular;  Laterality: Left;  . INSERTION OF DIALYSIS CATHETER Right 07/18/2013   Procedure: INSERTION OF DIALYSIS CATHETER;  Surgeon: Rosetta Posner, MD;  Location: [place];  Service: Vascular;  Laterality: Right;  . RENAL BIOPSY    . REVISON OF ARTERIOVENOUS FISTULA Left 07/15/2018   FOREARM  . REVISON OF ARTERIOVENOUS FISTULA Left 07/15/2018   Procedure: REVISION OF ARTERIOVENOUS FISTULA LEFT FOREARM;  Surgeon: Waynetta Sandy, MD;  Location: [place];  Service: Vascular;  Laterality: Left;        Home Medications    Prior to Admission medications   Medication Sig Start Date End Date Taking? Authorizing Provider  FOSRENOL 1000 MG PACK Take 1,000 mg by mouth 2 (two) times daily with a meal.  08/06/17   [provider]    Family History Family History  Problem Relation Age of Onset  . Hypertension Mother     Social History Social History   Tobacco Use  . Smoking status: Passive Smoke Exposure - Never Smoker  . Smokeless tobacco: Never Used  Substance Use Topics  . Alcohol use: Not Currently    Alcohol/week: 1.0 standard drinks    Types: 1 Shots of liquor per week  . Drug use: No     Allergies   Patient has no known allergies.   Review of Systems Review of Systems  Respiratory: Negative for shortness of breath.   Cardiovascular: Negative  for chest pain.  Gastrointestinal: Negative for abdominal pain.  Neurological: Negative for headaches.  All other systems reviewed and are negative.    Physical Exam Updated Vital Signs BP 106/81 (BP Location: Right Arm)   Pulse 100   Temp 98.2 F (36.8 C) (Oral)   Resp 20   SpO2 100%   Physical Exam Vitals signs and nursing note reviewed.  Constitutional:      Appearance: He is well-developed.  HENT:     Head: Normocephalic.     Comments: No obvious depression concerning  for skull fracture  Abrasion over right forehead, no obvious bleeding, no obvious deeper laceration  Swelling to right side of nasal bridge, tenderness with palpation nasal bridge without any obvious abnormality, no obvious nasal septal hematoma appreciated  Small abrasion to inside of upper lip, no obvious deeper laceration or wound, no obvious bleeding, no obvious purulent discharge, no obvious dental fracture or instability with teeth  No obvious tenderness with palpation of gumline  No obvious asymmetry when patient bites down    Mouth/Throat:     Mouth: Mucous membranes are moist.  Eyes:     Extraocular Movements: Extraocular movements intact.     Conjunctiva/sclera: Conjunctivae normal.     Pupils: Pupils are equal, round, and reactive to light.  Neck:     Musculoskeletal: Neck supple.  Cardiovascular:     Rate and Rhythm: Normal rate.  Pulmonary:     Effort: Pulmonary effort is normal. No respiratory distress.     Breath sounds: No stridor. No wheezing or rhonchi.  Abdominal:     Palpations: Abdomen is soft.     Tenderness: There is no abdominal tenderness.  Musculoskeletal:     Right lower leg: No edema.     Left lower leg: No edema.  Skin:    General: Skin is warm and dry.     Capillary Refill: Capillary refill takes less than 2 seconds.     Findings: No rash.  Neurological:     General: No focal deficit present.     Mental Status: He is alert and oriented to person, place, and time.      ED Treatments / Results  Labs (all labs ordered are listed, but only abnormal results are displayed) Labs Reviewed  NOVEL CORONAVIRUS, NAA (HOSPITAL ORDER, SEND-OUT TO REF LAB)    EKG None  Radiology Dg Nasal Bones  Result Date: 03/20/2019 CLINICAL DATA:  Facial swelling post fall. EXAM: NASAL BONES - 3+ VIEW COMPARISON:  None. FINDINGS: There is no evidence of fracture or other bone abnormality. IMPRESSION: Negative. Electronically Signed   By: Fidela Salisbury M.D.    On: 03/20/2019 18:44    Procedures Procedures (including critical care time)  Medications Ordered in ED Medications  oxymetazoline (AFRIN) 0.05 % nasal spray 1 spray (has no administration in time range)     Initial Impression / Assessment and Plan / ED Course  I have reviewed the triage vital signs and the nursing notes.  Pertinent labs & imaging results that were available during my care of the patient were reviewed by me and considered in my medical decision making (see chart for details).        Medical Decision Making: Christopher Wang is a 33 y.o. male who presented to the ED today status post fall 2 days ago while walking, hit forehead on carpeted floor.  Past medical history significant for hypertension, FSGS, ESRD on IHD Reviewed and confirmed nursing documentation for past medical history,  family history, social history.  On my initial exam, the pt was calm, cooperative, conversant, follows commands appropriately, GCS 15, not tachycardic, not hypotensive, afebrile, no increased work of breathing or respiratory distress, no signs of impending respiratory failure.  Areas appreciated over her forehead, nose, upper lip where patient sustained abrasions status post fall, patient has been performing supportive care with Neosporin, warm and cold compresses, healing appropriately.  Focal tenderness over nasal bridge without any obvious nasal septal hematoma, no obvious open wound over nose, educated patient about nasal fractures, patient requesting plain film imaging for further assessment.   Plain films showed no focal fracture.  Will give patient Afrin spray PRN if any epistaxis develops Patient has positive sick contact with coronavirus at work, grossly asymptomatic but will send outpatient testing  All radiology and laboratory studies reviewed independently and with my attending physician, agree with reading provided by radiologist unless otherwise noted.  Upon reassessing  patient, patient was calm, no new complaints Based on the above findings, I believe patient is hemodynamically stable for discharge.  Patient educated about specific return precautions for given chief complaint and symptoms.  Patient educated about follow-up with PCP.  Patient expressed understanding of return precautions and need for follow-up.  Patient discharged. The above care was discussed with and agreed upon by my attending physician. Emergency Department Medication Summary:  Medications  oxymetazoline (AFRIN) 0.05 % nasal spray 1 spray (has no administration in time range)   Final Clinical Impressions(s) / ED Diagnoses   Final diagnoses:  Fall, initial encounter    ED Discharge Orders    None       Lonzo Candy, MD 03/20/19 Randol Kern    Gareth Morgan, MD 03/24/19 1555

## 2019-03-20 NOTE — ED Notes (Signed)
Patient transported to X-ray 

## 2019-03-20 NOTE — ED Notes (Signed)
Patient verbalizes understanding of discharge instructions. Opportunity for questioning and answers were provided. Armband removed by staff, pt discharged from ED ambulatory to home.  

## 2019-03-20 NOTE — Discharge Instructions (Signed)
Advance activities of daily living as tolerated. Stand up and ambulate with assistance until comfortable doing independently.  Advance diet as tolerated based on nausea, vomiting. Start with clear liquids and soft mechanical and advance.  Please return to ED for chest pain, shortness of breath, uncontrolled nausea, vomiting, any new concerning symptoms.  If prescribed any medications please take appropriate dose as prescribed and appropriate frequency has prescribed.  Please follow up with your PCP in 2-4 weeks or sooner if needed.

## 2019-03-21 DIAGNOSIS — D631 Anemia in chronic kidney disease: Secondary | ICD-10-CM | POA: Diagnosis not present

## 2019-03-21 DIAGNOSIS — N2581 Secondary hyperparathyroidism of renal origin: Secondary | ICD-10-CM | POA: Diagnosis not present

## 2019-03-21 DIAGNOSIS — D509 Iron deficiency anemia, unspecified: Secondary | ICD-10-CM | POA: Diagnosis not present

## 2019-03-21 DIAGNOSIS — N186 End stage renal disease: Secondary | ICD-10-CM | POA: Diagnosis not present

## 2019-03-21 LAB — NOVEL CORONAVIRUS, NAA (HOSP ORDER, SEND-OUT TO REF LAB; TAT 18-24 HRS): SARS-CoV-2, NAA: NOT DETECTED

## 2019-03-23 DIAGNOSIS — N2581 Secondary hyperparathyroidism of renal origin: Secondary | ICD-10-CM | POA: Diagnosis not present

## 2019-03-23 DIAGNOSIS — N186 End stage renal disease: Secondary | ICD-10-CM | POA: Diagnosis not present

## 2019-03-23 DIAGNOSIS — D509 Iron deficiency anemia, unspecified: Secondary | ICD-10-CM | POA: Diagnosis not present

## 2019-03-23 DIAGNOSIS — D631 Anemia in chronic kidney disease: Secondary | ICD-10-CM | POA: Diagnosis not present

## 2019-03-25 DIAGNOSIS — N2581 Secondary hyperparathyroidism of renal origin: Secondary | ICD-10-CM | POA: Diagnosis not present

## 2019-03-25 DIAGNOSIS — N186 End stage renal disease: Secondary | ICD-10-CM | POA: Diagnosis not present

## 2019-03-25 DIAGNOSIS — D509 Iron deficiency anemia, unspecified: Secondary | ICD-10-CM | POA: Diagnosis not present

## 2019-03-25 DIAGNOSIS — D631 Anemia in chronic kidney disease: Secondary | ICD-10-CM | POA: Diagnosis not present

## 2019-03-28 DIAGNOSIS — D631 Anemia in chronic kidney disease: Secondary | ICD-10-CM | POA: Diagnosis not present

## 2019-03-28 DIAGNOSIS — N2581 Secondary hyperparathyroidism of renal origin: Secondary | ICD-10-CM | POA: Diagnosis not present

## 2019-03-28 DIAGNOSIS — N186 End stage renal disease: Secondary | ICD-10-CM | POA: Diagnosis not present

## 2019-03-28 DIAGNOSIS — D509 Iron deficiency anemia, unspecified: Secondary | ICD-10-CM | POA: Diagnosis not present

## 2019-03-30 DIAGNOSIS — D509 Iron deficiency anemia, unspecified: Secondary | ICD-10-CM | POA: Diagnosis not present

## 2019-03-30 DIAGNOSIS — N2581 Secondary hyperparathyroidism of renal origin: Secondary | ICD-10-CM | POA: Diagnosis not present

## 2019-03-30 DIAGNOSIS — N186 End stage renal disease: Secondary | ICD-10-CM | POA: Diagnosis not present

## 2019-03-30 DIAGNOSIS — N033 Chronic nephritic syndrome with diffuse mesangial proliferative glomerulonephritis: Secondary | ICD-10-CM | POA: Diagnosis not present

## 2019-03-30 DIAGNOSIS — Z992 Dependence on renal dialysis: Secondary | ICD-10-CM | POA: Diagnosis not present

## 2019-03-30 DIAGNOSIS — D631 Anemia in chronic kidney disease: Secondary | ICD-10-CM | POA: Diagnosis not present

## 2019-04-01 DIAGNOSIS — N2581 Secondary hyperparathyroidism of renal origin: Secondary | ICD-10-CM | POA: Diagnosis not present

## 2019-04-01 DIAGNOSIS — D631 Anemia in chronic kidney disease: Secondary | ICD-10-CM | POA: Diagnosis not present

## 2019-04-01 DIAGNOSIS — N186 End stage renal disease: Secondary | ICD-10-CM | POA: Diagnosis not present

## 2019-04-01 DIAGNOSIS — D509 Iron deficiency anemia, unspecified: Secondary | ICD-10-CM | POA: Diagnosis not present

## 2019-04-04 DIAGNOSIS — N2581 Secondary hyperparathyroidism of renal origin: Secondary | ICD-10-CM | POA: Diagnosis not present

## 2019-04-04 DIAGNOSIS — N186 End stage renal disease: Secondary | ICD-10-CM | POA: Diagnosis not present

## 2019-04-04 DIAGNOSIS — D631 Anemia in chronic kidney disease: Secondary | ICD-10-CM | POA: Diagnosis not present

## 2019-04-04 DIAGNOSIS — D509 Iron deficiency anemia, unspecified: Secondary | ICD-10-CM | POA: Diagnosis not present

## 2019-04-06 DIAGNOSIS — D509 Iron deficiency anemia, unspecified: Secondary | ICD-10-CM | POA: Diagnosis not present

## 2019-04-06 DIAGNOSIS — N186 End stage renal disease: Secondary | ICD-10-CM | POA: Diagnosis not present

## 2019-04-06 DIAGNOSIS — N2581 Secondary hyperparathyroidism of renal origin: Secondary | ICD-10-CM | POA: Diagnosis not present

## 2019-04-06 DIAGNOSIS — D631 Anemia in chronic kidney disease: Secondary | ICD-10-CM | POA: Diagnosis not present

## 2019-04-08 DIAGNOSIS — N186 End stage renal disease: Secondary | ICD-10-CM | POA: Diagnosis not present

## 2019-04-08 DIAGNOSIS — N2581 Secondary hyperparathyroidism of renal origin: Secondary | ICD-10-CM | POA: Diagnosis not present

## 2019-04-08 DIAGNOSIS — D509 Iron deficiency anemia, unspecified: Secondary | ICD-10-CM | POA: Diagnosis not present

## 2019-04-08 DIAGNOSIS — D631 Anemia in chronic kidney disease: Secondary | ICD-10-CM | POA: Diagnosis not present

## 2019-04-11 DIAGNOSIS — D509 Iron deficiency anemia, unspecified: Secondary | ICD-10-CM | POA: Diagnosis not present

## 2019-04-11 DIAGNOSIS — N2581 Secondary hyperparathyroidism of renal origin: Secondary | ICD-10-CM | POA: Diagnosis not present

## 2019-04-11 DIAGNOSIS — N186 End stage renal disease: Secondary | ICD-10-CM | POA: Diagnosis not present

## 2019-04-11 DIAGNOSIS — D631 Anemia in chronic kidney disease: Secondary | ICD-10-CM | POA: Diagnosis not present

## 2019-04-13 DIAGNOSIS — N2581 Secondary hyperparathyroidism of renal origin: Secondary | ICD-10-CM | POA: Diagnosis not present

## 2019-04-13 DIAGNOSIS — D631 Anemia in chronic kidney disease: Secondary | ICD-10-CM | POA: Diagnosis not present

## 2019-04-13 DIAGNOSIS — N186 End stage renal disease: Secondary | ICD-10-CM | POA: Diagnosis not present

## 2019-04-13 DIAGNOSIS — D509 Iron deficiency anemia, unspecified: Secondary | ICD-10-CM | POA: Diagnosis not present

## 2019-04-15 DIAGNOSIS — D631 Anemia in chronic kidney disease: Secondary | ICD-10-CM | POA: Diagnosis not present

## 2019-04-15 DIAGNOSIS — N2581 Secondary hyperparathyroidism of renal origin: Secondary | ICD-10-CM | POA: Diagnosis not present

## 2019-04-15 DIAGNOSIS — D509 Iron deficiency anemia, unspecified: Secondary | ICD-10-CM | POA: Diagnosis not present

## 2019-04-15 DIAGNOSIS — N186 End stage renal disease: Secondary | ICD-10-CM | POA: Diagnosis not present

## 2019-04-18 DIAGNOSIS — N186 End stage renal disease: Secondary | ICD-10-CM | POA: Diagnosis not present

## 2019-04-18 DIAGNOSIS — D631 Anemia in chronic kidney disease: Secondary | ICD-10-CM | POA: Diagnosis not present

## 2019-04-18 DIAGNOSIS — N2581 Secondary hyperparathyroidism of renal origin: Secondary | ICD-10-CM | POA: Diagnosis not present

## 2019-04-18 DIAGNOSIS — D509 Iron deficiency anemia, unspecified: Secondary | ICD-10-CM | POA: Diagnosis not present

## 2019-04-20 DIAGNOSIS — N2581 Secondary hyperparathyroidism of renal origin: Secondary | ICD-10-CM | POA: Diagnosis not present

## 2019-04-20 DIAGNOSIS — D631 Anemia in chronic kidney disease: Secondary | ICD-10-CM | POA: Diagnosis not present

## 2019-04-20 DIAGNOSIS — D509 Iron deficiency anemia, unspecified: Secondary | ICD-10-CM | POA: Diagnosis not present

## 2019-04-20 DIAGNOSIS — N186 End stage renal disease: Secondary | ICD-10-CM | POA: Diagnosis not present

## 2019-04-22 DIAGNOSIS — D509 Iron deficiency anemia, unspecified: Secondary | ICD-10-CM | POA: Diagnosis not present

## 2019-04-22 DIAGNOSIS — N186 End stage renal disease: Secondary | ICD-10-CM | POA: Diagnosis not present

## 2019-04-22 DIAGNOSIS — N2581 Secondary hyperparathyroidism of renal origin: Secondary | ICD-10-CM | POA: Diagnosis not present

## 2019-04-22 DIAGNOSIS — D631 Anemia in chronic kidney disease: Secondary | ICD-10-CM | POA: Diagnosis not present

## 2019-04-25 DIAGNOSIS — N186 End stage renal disease: Secondary | ICD-10-CM | POA: Diagnosis not present

## 2019-04-25 DIAGNOSIS — D509 Iron deficiency anemia, unspecified: Secondary | ICD-10-CM | POA: Diagnosis not present

## 2019-04-25 DIAGNOSIS — N2581 Secondary hyperparathyroidism of renal origin: Secondary | ICD-10-CM | POA: Diagnosis not present

## 2019-04-25 DIAGNOSIS — D631 Anemia in chronic kidney disease: Secondary | ICD-10-CM | POA: Diagnosis not present

## 2019-04-27 DIAGNOSIS — D509 Iron deficiency anemia, unspecified: Secondary | ICD-10-CM | POA: Diagnosis not present

## 2019-04-27 DIAGNOSIS — N2581 Secondary hyperparathyroidism of renal origin: Secondary | ICD-10-CM | POA: Diagnosis not present

## 2019-04-27 DIAGNOSIS — N186 End stage renal disease: Secondary | ICD-10-CM | POA: Diagnosis not present

## 2019-04-27 DIAGNOSIS — D631 Anemia in chronic kidney disease: Secondary | ICD-10-CM | POA: Diagnosis not present

## 2019-04-28 DIAGNOSIS — D631 Anemia in chronic kidney disease: Secondary | ICD-10-CM | POA: Diagnosis not present

## 2019-04-28 DIAGNOSIS — N2581 Secondary hyperparathyroidism of renal origin: Secondary | ICD-10-CM | POA: Diagnosis not present

## 2019-04-28 DIAGNOSIS — N186 End stage renal disease: Secondary | ICD-10-CM | POA: Diagnosis not present

## 2019-04-28 DIAGNOSIS — D509 Iron deficiency anemia, unspecified: Secondary | ICD-10-CM | POA: Diagnosis not present

## 2019-04-30 DIAGNOSIS — N033 Chronic nephritic syndrome with diffuse mesangial proliferative glomerulonephritis: Secondary | ICD-10-CM | POA: Diagnosis not present

## 2019-04-30 DIAGNOSIS — N186 End stage renal disease: Secondary | ICD-10-CM | POA: Diagnosis not present

## 2019-04-30 DIAGNOSIS — Z992 Dependence on renal dialysis: Secondary | ICD-10-CM | POA: Diagnosis not present

## 2019-05-02 DIAGNOSIS — D509 Iron deficiency anemia, unspecified: Secondary | ICD-10-CM | POA: Diagnosis not present

## 2019-05-02 DIAGNOSIS — N2581 Secondary hyperparathyroidism of renal origin: Secondary | ICD-10-CM | POA: Diagnosis not present

## 2019-05-02 DIAGNOSIS — N186 End stage renal disease: Secondary | ICD-10-CM | POA: Diagnosis not present

## 2019-05-02 DIAGNOSIS — Z992 Dependence on renal dialysis: Secondary | ICD-10-CM | POA: Diagnosis not present

## 2019-05-02 DIAGNOSIS — D631 Anemia in chronic kidney disease: Secondary | ICD-10-CM | POA: Diagnosis not present

## 2019-05-04 DIAGNOSIS — N186 End stage renal disease: Secondary | ICD-10-CM | POA: Diagnosis not present

## 2019-05-04 DIAGNOSIS — D509 Iron deficiency anemia, unspecified: Secondary | ICD-10-CM | POA: Diagnosis not present

## 2019-05-04 DIAGNOSIS — D631 Anemia in chronic kidney disease: Secondary | ICD-10-CM | POA: Diagnosis not present

## 2019-05-04 DIAGNOSIS — N2581 Secondary hyperparathyroidism of renal origin: Secondary | ICD-10-CM | POA: Diagnosis not present

## 2019-05-04 DIAGNOSIS — Z992 Dependence on renal dialysis: Secondary | ICD-10-CM | POA: Diagnosis not present

## 2019-05-06 DIAGNOSIS — D509 Iron deficiency anemia, unspecified: Secondary | ICD-10-CM | POA: Diagnosis not present

## 2019-05-06 DIAGNOSIS — N2581 Secondary hyperparathyroidism of renal origin: Secondary | ICD-10-CM | POA: Diagnosis not present

## 2019-05-06 DIAGNOSIS — Z992 Dependence on renal dialysis: Secondary | ICD-10-CM | POA: Diagnosis not present

## 2019-05-06 DIAGNOSIS — N186 End stage renal disease: Secondary | ICD-10-CM | POA: Diagnosis not present

## 2019-05-06 DIAGNOSIS — D631 Anemia in chronic kidney disease: Secondary | ICD-10-CM | POA: Diagnosis not present

## 2019-05-09 DIAGNOSIS — N2581 Secondary hyperparathyroidism of renal origin: Secondary | ICD-10-CM | POA: Diagnosis not present

## 2019-05-09 DIAGNOSIS — D631 Anemia in chronic kidney disease: Secondary | ICD-10-CM | POA: Diagnosis not present

## 2019-05-09 DIAGNOSIS — N186 End stage renal disease: Secondary | ICD-10-CM | POA: Diagnosis not present

## 2019-05-09 DIAGNOSIS — D509 Iron deficiency anemia, unspecified: Secondary | ICD-10-CM | POA: Diagnosis not present

## 2019-05-09 DIAGNOSIS — Z992 Dependence on renal dialysis: Secondary | ICD-10-CM | POA: Diagnosis not present

## 2019-05-11 DIAGNOSIS — N186 End stage renal disease: Secondary | ICD-10-CM | POA: Diagnosis not present

## 2019-05-11 DIAGNOSIS — D509 Iron deficiency anemia, unspecified: Secondary | ICD-10-CM | POA: Diagnosis not present

## 2019-05-11 DIAGNOSIS — N2581 Secondary hyperparathyroidism of renal origin: Secondary | ICD-10-CM | POA: Diagnosis not present

## 2019-05-11 DIAGNOSIS — D631 Anemia in chronic kidney disease: Secondary | ICD-10-CM | POA: Diagnosis not present

## 2019-05-11 DIAGNOSIS — Z992 Dependence on renal dialysis: Secondary | ICD-10-CM | POA: Diagnosis not present

## 2019-05-13 DIAGNOSIS — D509 Iron deficiency anemia, unspecified: Secondary | ICD-10-CM | POA: Diagnosis not present

## 2019-05-13 DIAGNOSIS — Z992 Dependence on renal dialysis: Secondary | ICD-10-CM | POA: Diagnosis not present

## 2019-05-13 DIAGNOSIS — N186 End stage renal disease: Secondary | ICD-10-CM | POA: Diagnosis not present

## 2019-05-13 DIAGNOSIS — D631 Anemia in chronic kidney disease: Secondary | ICD-10-CM | POA: Diagnosis not present

## 2019-05-13 DIAGNOSIS — N2581 Secondary hyperparathyroidism of renal origin: Secondary | ICD-10-CM | POA: Diagnosis not present

## 2019-05-16 DIAGNOSIS — D509 Iron deficiency anemia, unspecified: Secondary | ICD-10-CM | POA: Diagnosis not present

## 2019-05-16 DIAGNOSIS — D631 Anemia in chronic kidney disease: Secondary | ICD-10-CM | POA: Diagnosis not present

## 2019-05-16 DIAGNOSIS — N2581 Secondary hyperparathyroidism of renal origin: Secondary | ICD-10-CM | POA: Diagnosis not present

## 2019-05-16 DIAGNOSIS — Z992 Dependence on renal dialysis: Secondary | ICD-10-CM | POA: Diagnosis not present

## 2019-05-16 DIAGNOSIS — N186 End stage renal disease: Secondary | ICD-10-CM | POA: Diagnosis not present

## 2019-05-18 DIAGNOSIS — D509 Iron deficiency anemia, unspecified: Secondary | ICD-10-CM | POA: Diagnosis not present

## 2019-05-18 DIAGNOSIS — N2581 Secondary hyperparathyroidism of renal origin: Secondary | ICD-10-CM | POA: Diagnosis not present

## 2019-05-18 DIAGNOSIS — D631 Anemia in chronic kidney disease: Secondary | ICD-10-CM | POA: Diagnosis not present

## 2019-05-18 DIAGNOSIS — Z992 Dependence on renal dialysis: Secondary | ICD-10-CM | POA: Diagnosis not present

## 2019-05-18 DIAGNOSIS — N186 End stage renal disease: Secondary | ICD-10-CM | POA: Diagnosis not present

## 2019-05-20 DIAGNOSIS — D509 Iron deficiency anemia, unspecified: Secondary | ICD-10-CM | POA: Diagnosis not present

## 2019-05-20 DIAGNOSIS — D631 Anemia in chronic kidney disease: Secondary | ICD-10-CM | POA: Diagnosis not present

## 2019-05-20 DIAGNOSIS — N186 End stage renal disease: Secondary | ICD-10-CM | POA: Diagnosis not present

## 2019-05-20 DIAGNOSIS — N2581 Secondary hyperparathyroidism of renal origin: Secondary | ICD-10-CM | POA: Diagnosis not present

## 2019-05-20 DIAGNOSIS — Z992 Dependence on renal dialysis: Secondary | ICD-10-CM | POA: Diagnosis not present

## 2019-05-23 DIAGNOSIS — N2581 Secondary hyperparathyroidism of renal origin: Secondary | ICD-10-CM | POA: Diagnosis not present

## 2019-05-23 DIAGNOSIS — Z992 Dependence on renal dialysis: Secondary | ICD-10-CM | POA: Diagnosis not present

## 2019-05-23 DIAGNOSIS — D631 Anemia in chronic kidney disease: Secondary | ICD-10-CM | POA: Diagnosis not present

## 2019-05-23 DIAGNOSIS — N186 End stage renal disease: Secondary | ICD-10-CM | POA: Diagnosis not present

## 2019-05-23 DIAGNOSIS — D509 Iron deficiency anemia, unspecified: Secondary | ICD-10-CM | POA: Diagnosis not present

## 2019-05-25 DIAGNOSIS — N186 End stage renal disease: Secondary | ICD-10-CM | POA: Diagnosis not present

## 2019-05-25 DIAGNOSIS — N2581 Secondary hyperparathyroidism of renal origin: Secondary | ICD-10-CM | POA: Diagnosis not present

## 2019-05-25 DIAGNOSIS — D631 Anemia in chronic kidney disease: Secondary | ICD-10-CM | POA: Diagnosis not present

## 2019-05-25 DIAGNOSIS — D509 Iron deficiency anemia, unspecified: Secondary | ICD-10-CM | POA: Diagnosis not present

## 2019-05-25 DIAGNOSIS — Z992 Dependence on renal dialysis: Secondary | ICD-10-CM | POA: Diagnosis not present

## 2019-05-27 DIAGNOSIS — N2581 Secondary hyperparathyroidism of renal origin: Secondary | ICD-10-CM | POA: Diagnosis not present

## 2019-05-27 DIAGNOSIS — Z992 Dependence on renal dialysis: Secondary | ICD-10-CM | POA: Diagnosis not present

## 2019-05-27 DIAGNOSIS — N186 End stage renal disease: Secondary | ICD-10-CM | POA: Diagnosis not present

## 2019-05-27 DIAGNOSIS — D509 Iron deficiency anemia, unspecified: Secondary | ICD-10-CM | POA: Diagnosis not present

## 2019-05-27 DIAGNOSIS — D631 Anemia in chronic kidney disease: Secondary | ICD-10-CM | POA: Diagnosis not present

## 2019-05-30 DIAGNOSIS — N186 End stage renal disease: Secondary | ICD-10-CM | POA: Diagnosis not present

## 2019-05-30 DIAGNOSIS — D631 Anemia in chronic kidney disease: Secondary | ICD-10-CM | POA: Diagnosis not present

## 2019-05-30 DIAGNOSIS — N2581 Secondary hyperparathyroidism of renal origin: Secondary | ICD-10-CM | POA: Diagnosis not present

## 2019-05-30 DIAGNOSIS — Z992 Dependence on renal dialysis: Secondary | ICD-10-CM | POA: Diagnosis not present

## 2019-05-30 DIAGNOSIS — D509 Iron deficiency anemia, unspecified: Secondary | ICD-10-CM | POA: Diagnosis not present

## 2019-05-31 DIAGNOSIS — N186 End stage renal disease: Secondary | ICD-10-CM | POA: Diagnosis not present

## 2019-05-31 DIAGNOSIS — N033 Chronic nephritic syndrome with diffuse mesangial proliferative glomerulonephritis: Secondary | ICD-10-CM | POA: Diagnosis not present

## 2019-05-31 DIAGNOSIS — Z992 Dependence on renal dialysis: Secondary | ICD-10-CM | POA: Diagnosis not present

## 2019-06-01 DIAGNOSIS — Z992 Dependence on renal dialysis: Secondary | ICD-10-CM | POA: Diagnosis not present

## 2019-06-01 DIAGNOSIS — N186 End stage renal disease: Secondary | ICD-10-CM | POA: Diagnosis not present

## 2019-06-01 DIAGNOSIS — N2581 Secondary hyperparathyroidism of renal origin: Secondary | ICD-10-CM | POA: Diagnosis not present

## 2019-06-01 DIAGNOSIS — D509 Iron deficiency anemia, unspecified: Secondary | ICD-10-CM | POA: Diagnosis not present

## 2019-06-03 DIAGNOSIS — N2581 Secondary hyperparathyroidism of renal origin: Secondary | ICD-10-CM | POA: Diagnosis not present

## 2019-06-03 DIAGNOSIS — D509 Iron deficiency anemia, unspecified: Secondary | ICD-10-CM | POA: Diagnosis not present

## 2019-06-03 DIAGNOSIS — Z992 Dependence on renal dialysis: Secondary | ICD-10-CM | POA: Diagnosis not present

## 2019-06-03 DIAGNOSIS — N186 End stage renal disease: Secondary | ICD-10-CM | POA: Diagnosis not present

## 2019-06-06 DIAGNOSIS — Z992 Dependence on renal dialysis: Secondary | ICD-10-CM | POA: Diagnosis not present

## 2019-06-06 DIAGNOSIS — N186 End stage renal disease: Secondary | ICD-10-CM | POA: Diagnosis not present

## 2019-06-06 DIAGNOSIS — N2581 Secondary hyperparathyroidism of renal origin: Secondary | ICD-10-CM | POA: Diagnosis not present

## 2019-06-06 DIAGNOSIS — D509 Iron deficiency anemia, unspecified: Secondary | ICD-10-CM | POA: Diagnosis not present

## 2019-06-08 DIAGNOSIS — N186 End stage renal disease: Secondary | ICD-10-CM | POA: Diagnosis not present

## 2019-06-08 DIAGNOSIS — Z992 Dependence on renal dialysis: Secondary | ICD-10-CM | POA: Diagnosis not present

## 2019-06-08 DIAGNOSIS — N2581 Secondary hyperparathyroidism of renal origin: Secondary | ICD-10-CM | POA: Diagnosis not present

## 2019-06-08 DIAGNOSIS — D509 Iron deficiency anemia, unspecified: Secondary | ICD-10-CM | POA: Diagnosis not present

## 2019-06-10 DIAGNOSIS — N186 End stage renal disease: Secondary | ICD-10-CM | POA: Diagnosis not present

## 2019-06-10 DIAGNOSIS — N2581 Secondary hyperparathyroidism of renal origin: Secondary | ICD-10-CM | POA: Diagnosis not present

## 2019-06-10 DIAGNOSIS — D509 Iron deficiency anemia, unspecified: Secondary | ICD-10-CM | POA: Diagnosis not present

## 2019-06-10 DIAGNOSIS — Z992 Dependence on renal dialysis: Secondary | ICD-10-CM | POA: Diagnosis not present

## 2019-06-13 DIAGNOSIS — N186 End stage renal disease: Secondary | ICD-10-CM | POA: Diagnosis not present

## 2019-06-13 DIAGNOSIS — D509 Iron deficiency anemia, unspecified: Secondary | ICD-10-CM | POA: Diagnosis not present

## 2019-06-13 DIAGNOSIS — Z992 Dependence on renal dialysis: Secondary | ICD-10-CM | POA: Diagnosis not present

## 2019-06-13 DIAGNOSIS — N2581 Secondary hyperparathyroidism of renal origin: Secondary | ICD-10-CM | POA: Diagnosis not present

## 2019-06-15 DIAGNOSIS — D509 Iron deficiency anemia, unspecified: Secondary | ICD-10-CM | POA: Diagnosis not present

## 2019-06-15 DIAGNOSIS — N2581 Secondary hyperparathyroidism of renal origin: Secondary | ICD-10-CM | POA: Diagnosis not present

## 2019-06-15 DIAGNOSIS — N186 End stage renal disease: Secondary | ICD-10-CM | POA: Diagnosis not present

## 2019-06-15 DIAGNOSIS — Z992 Dependence on renal dialysis: Secondary | ICD-10-CM | POA: Diagnosis not present

## 2019-06-17 DIAGNOSIS — Z992 Dependence on renal dialysis: Secondary | ICD-10-CM | POA: Diagnosis not present

## 2019-06-17 DIAGNOSIS — N2581 Secondary hyperparathyroidism of renal origin: Secondary | ICD-10-CM | POA: Diagnosis not present

## 2019-06-17 DIAGNOSIS — D509 Iron deficiency anemia, unspecified: Secondary | ICD-10-CM | POA: Diagnosis not present

## 2019-06-17 DIAGNOSIS — N186 End stage renal disease: Secondary | ICD-10-CM | POA: Diagnosis not present

## 2019-06-20 DIAGNOSIS — D509 Iron deficiency anemia, unspecified: Secondary | ICD-10-CM | POA: Diagnosis not present

## 2019-06-20 DIAGNOSIS — N186 End stage renal disease: Secondary | ICD-10-CM | POA: Diagnosis not present

## 2019-06-20 DIAGNOSIS — N2581 Secondary hyperparathyroidism of renal origin: Secondary | ICD-10-CM | POA: Diagnosis not present

## 2019-06-20 DIAGNOSIS — Z992 Dependence on renal dialysis: Secondary | ICD-10-CM | POA: Diagnosis not present

## 2019-06-22 DIAGNOSIS — Z992 Dependence on renal dialysis: Secondary | ICD-10-CM | POA: Diagnosis not present

## 2019-06-22 DIAGNOSIS — N2581 Secondary hyperparathyroidism of renal origin: Secondary | ICD-10-CM | POA: Diagnosis not present

## 2019-06-22 DIAGNOSIS — D509 Iron deficiency anemia, unspecified: Secondary | ICD-10-CM | POA: Diagnosis not present

## 2019-06-22 DIAGNOSIS — N186 End stage renal disease: Secondary | ICD-10-CM | POA: Diagnosis not present

## 2019-06-24 DIAGNOSIS — Z992 Dependence on renal dialysis: Secondary | ICD-10-CM | POA: Diagnosis not present

## 2019-06-24 DIAGNOSIS — N2581 Secondary hyperparathyroidism of renal origin: Secondary | ICD-10-CM | POA: Diagnosis not present

## 2019-06-24 DIAGNOSIS — N186 End stage renal disease: Secondary | ICD-10-CM | POA: Diagnosis not present

## 2019-06-24 DIAGNOSIS — D509 Iron deficiency anemia, unspecified: Secondary | ICD-10-CM | POA: Diagnosis not present

## 2019-06-27 DIAGNOSIS — D509 Iron deficiency anemia, unspecified: Secondary | ICD-10-CM | POA: Diagnosis not present

## 2019-06-27 DIAGNOSIS — N186 End stage renal disease: Secondary | ICD-10-CM | POA: Diagnosis not present

## 2019-06-27 DIAGNOSIS — Z992 Dependence on renal dialysis: Secondary | ICD-10-CM | POA: Diagnosis not present

## 2019-06-27 DIAGNOSIS — N2581 Secondary hyperparathyroidism of renal origin: Secondary | ICD-10-CM | POA: Diagnosis not present

## 2019-06-28 DIAGNOSIS — H9202 Otalgia, left ear: Secondary | ICD-10-CM | POA: Diagnosis not present

## 2019-06-29 DIAGNOSIS — D509 Iron deficiency anemia, unspecified: Secondary | ICD-10-CM | POA: Diagnosis not present

## 2019-06-29 DIAGNOSIS — N186 End stage renal disease: Secondary | ICD-10-CM | POA: Diagnosis not present

## 2019-06-29 DIAGNOSIS — N2581 Secondary hyperparathyroidism of renal origin: Secondary | ICD-10-CM | POA: Diagnosis not present

## 2019-06-29 DIAGNOSIS — Z992 Dependence on renal dialysis: Secondary | ICD-10-CM | POA: Diagnosis not present

## 2019-06-30 DIAGNOSIS — N033 Chronic nephritic syndrome with diffuse mesangial proliferative glomerulonephritis: Secondary | ICD-10-CM | POA: Diagnosis not present

## 2019-06-30 DIAGNOSIS — N186 End stage renal disease: Secondary | ICD-10-CM | POA: Diagnosis not present

## 2019-06-30 DIAGNOSIS — Z992 Dependence on renal dialysis: Secondary | ICD-10-CM | POA: Diagnosis not present

## 2019-07-01 DIAGNOSIS — N2581 Secondary hyperparathyroidism of renal origin: Secondary | ICD-10-CM | POA: Diagnosis not present

## 2019-07-01 DIAGNOSIS — N186 End stage renal disease: Secondary | ICD-10-CM | POA: Diagnosis not present

## 2019-07-01 DIAGNOSIS — D631 Anemia in chronic kidney disease: Secondary | ICD-10-CM | POA: Diagnosis not present

## 2019-07-01 DIAGNOSIS — Z992 Dependence on renal dialysis: Secondary | ICD-10-CM | POA: Diagnosis not present

## 2019-07-01 DIAGNOSIS — D509 Iron deficiency anemia, unspecified: Secondary | ICD-10-CM | POA: Diagnosis not present

## 2019-07-04 DIAGNOSIS — N186 End stage renal disease: Secondary | ICD-10-CM | POA: Diagnosis not present

## 2019-07-04 DIAGNOSIS — Z992 Dependence on renal dialysis: Secondary | ICD-10-CM | POA: Diagnosis not present

## 2019-07-04 DIAGNOSIS — D509 Iron deficiency anemia, unspecified: Secondary | ICD-10-CM | POA: Diagnosis not present

## 2019-07-04 DIAGNOSIS — N2581 Secondary hyperparathyroidism of renal origin: Secondary | ICD-10-CM | POA: Diagnosis not present

## 2019-07-04 DIAGNOSIS — D631 Anemia in chronic kidney disease: Secondary | ICD-10-CM | POA: Diagnosis not present

## 2019-07-06 DIAGNOSIS — D509 Iron deficiency anemia, unspecified: Secondary | ICD-10-CM | POA: Diagnosis not present

## 2019-07-06 DIAGNOSIS — N186 End stage renal disease: Secondary | ICD-10-CM | POA: Diagnosis not present

## 2019-07-06 DIAGNOSIS — N2581 Secondary hyperparathyroidism of renal origin: Secondary | ICD-10-CM | POA: Diagnosis not present

## 2019-07-06 DIAGNOSIS — Z992 Dependence on renal dialysis: Secondary | ICD-10-CM | POA: Diagnosis not present

## 2019-07-06 DIAGNOSIS — D631 Anemia in chronic kidney disease: Secondary | ICD-10-CM | POA: Diagnosis not present

## 2019-07-08 DIAGNOSIS — N2581 Secondary hyperparathyroidism of renal origin: Secondary | ICD-10-CM | POA: Diagnosis not present

## 2019-07-08 DIAGNOSIS — D509 Iron deficiency anemia, unspecified: Secondary | ICD-10-CM | POA: Diagnosis not present

## 2019-07-08 DIAGNOSIS — N186 End stage renal disease: Secondary | ICD-10-CM | POA: Diagnosis not present

## 2019-07-08 DIAGNOSIS — Z992 Dependence on renal dialysis: Secondary | ICD-10-CM | POA: Diagnosis not present

## 2019-07-08 DIAGNOSIS — D631 Anemia in chronic kidney disease: Secondary | ICD-10-CM | POA: Diagnosis not present

## 2019-07-11 DIAGNOSIS — Z992 Dependence on renal dialysis: Secondary | ICD-10-CM | POA: Diagnosis not present

## 2019-07-11 DIAGNOSIS — N2581 Secondary hyperparathyroidism of renal origin: Secondary | ICD-10-CM | POA: Diagnosis not present

## 2019-07-11 DIAGNOSIS — D509 Iron deficiency anemia, unspecified: Secondary | ICD-10-CM | POA: Diagnosis not present

## 2019-07-11 DIAGNOSIS — D631 Anemia in chronic kidney disease: Secondary | ICD-10-CM | POA: Diagnosis not present

## 2019-07-11 DIAGNOSIS — N186 End stage renal disease: Secondary | ICD-10-CM | POA: Diagnosis not present

## 2019-07-13 DIAGNOSIS — N186 End stage renal disease: Secondary | ICD-10-CM | POA: Diagnosis not present

## 2019-07-13 DIAGNOSIS — D509 Iron deficiency anemia, unspecified: Secondary | ICD-10-CM | POA: Diagnosis not present

## 2019-07-13 DIAGNOSIS — D631 Anemia in chronic kidney disease: Secondary | ICD-10-CM | POA: Diagnosis not present

## 2019-07-13 DIAGNOSIS — Z992 Dependence on renal dialysis: Secondary | ICD-10-CM | POA: Diagnosis not present

## 2019-07-13 DIAGNOSIS — N2581 Secondary hyperparathyroidism of renal origin: Secondary | ICD-10-CM | POA: Diagnosis not present

## 2019-07-14 DIAGNOSIS — L720 Epidermal cyst: Secondary | ICD-10-CM | POA: Diagnosis not present

## 2019-07-15 DIAGNOSIS — D631 Anemia in chronic kidney disease: Secondary | ICD-10-CM | POA: Diagnosis not present

## 2019-07-15 DIAGNOSIS — D509 Iron deficiency anemia, unspecified: Secondary | ICD-10-CM | POA: Diagnosis not present

## 2019-07-15 DIAGNOSIS — Z992 Dependence on renal dialysis: Secondary | ICD-10-CM | POA: Diagnosis not present

## 2019-07-15 DIAGNOSIS — N186 End stage renal disease: Secondary | ICD-10-CM | POA: Diagnosis not present

## 2019-07-15 DIAGNOSIS — N2581 Secondary hyperparathyroidism of renal origin: Secondary | ICD-10-CM | POA: Diagnosis not present

## 2019-07-18 ENCOUNTER — Encounter (HOSPITAL_COMMUNITY): Payer: Self-pay | Admitting: Emergency Medicine

## 2019-07-18 ENCOUNTER — Other Ambulatory Visit: Payer: Self-pay

## 2019-07-18 ENCOUNTER — Emergency Department (HOSPITAL_COMMUNITY)
Admission: EM | Admit: 2019-07-18 | Discharge: 2019-07-19 | Disposition: A | Discharge status: Home / Self Care | Payer: Medicare Other | Attending: Emergency Medicine | Admitting: Emergency Medicine

## 2019-07-18 DIAGNOSIS — I132 Hypertensive heart and chronic kidney disease with heart failure and with stage 5 chronic kidney disease, or end stage renal disease: Secondary | ICD-10-CM | POA: Insufficient documentation

## 2019-07-18 DIAGNOSIS — D631 Anemia in chronic kidney disease: Secondary | ICD-10-CM | POA: Diagnosis not present

## 2019-07-18 DIAGNOSIS — Z992 Dependence on renal dialysis: Secondary | ICD-10-CM | POA: Insufficient documentation

## 2019-07-18 DIAGNOSIS — H9202 Otalgia, left ear: Secondary | ICD-10-CM | POA: Diagnosis not present

## 2019-07-18 DIAGNOSIS — D509 Iron deficiency anemia, unspecified: Secondary | ICD-10-CM | POA: Diagnosis not present

## 2019-07-18 DIAGNOSIS — I5022 Chronic systolic (congestive) heart failure: Secondary | ICD-10-CM | POA: Insufficient documentation

## 2019-07-18 DIAGNOSIS — N2581 Secondary hyperparathyroidism of renal origin: Secondary | ICD-10-CM | POA: Diagnosis not present

## 2019-07-18 DIAGNOSIS — Z03818 Encounter for observation for suspected exposure to other biological agents ruled out: Secondary | ICD-10-CM | POA: Diagnosis not present

## 2019-07-18 DIAGNOSIS — N186 End stage renal disease: Secondary | ICD-10-CM | POA: Diagnosis not present

## 2019-07-18 DIAGNOSIS — Z20828 Contact with and (suspected) exposure to other viral communicable diseases: Secondary | ICD-10-CM | POA: Insufficient documentation

## 2019-07-18 DIAGNOSIS — Z7722 Contact with and (suspected) exposure to environmental tobacco smoke (acute) (chronic): Secondary | ICD-10-CM | POA: Insufficient documentation

## 2019-07-18 NOTE — ED Triage Notes (Signed)
Patient with left ear pain.  States that he had a cyst, having some drainage from the cyst. Patient requesting covid test.

## 2019-07-19 ENCOUNTER — Encounter (HOSPITAL_BASED_OUTPATIENT_CLINIC_OR_DEPARTMENT_OTHER): Payer: Self-pay | Admitting: *Deleted

## 2019-07-19 MED ORDER — CEPHALEXIN 500 MG PO CAPS: 500.0000 mg | ORAL_CAPSULE | Freq: Two times a day (BID) | ORAL | 0 refills | Status: DC

## 2019-07-19 NOTE — Discharge Instructions (Addendum)
Take the prescribed medication as directed.  Recommend trying to do warm compresses at least twice a day for about 20 mins or so. Follow-up with the ENT office about your procedure. Return to the ED for new or worsening symptoms.

## 2019-07-19 NOTE — ED Provider Notes (Signed)
[facility] EMERGENCY DEPARTMENT Provider Note   CSN: [id number] Arrival date & time: 07/18/19  2312     History   Chief Complaint Chief Complaint  Patient presents with  . Otalgia    HPI Christopher Wang is a 33 y.o. male.     The history is provided by the patient and medical records.  Otalgia    33 year old male with history of end-stage renal disease on hemodialysis, anemia, headaches, hypertension, nephrotic syndrome, presenting to the ED for left ear pain.  States he has a cyst of his left earlobe, actually saw ENT about this last week.  He was to be scheduled for outpatient surgery for removal but this has not been confirmed yet.  Reports today he was leaning his head on his hand while at work and started to have some white drainage from the area.  He then started to have some oozing of blood.  States area actually feels "softer" now.  He denies any fever.  No changes in hearing.  He dialyzes on Monday, Wednesday, and Friday and has not missed any sessions.  He denies any shortness of breath.  Of note, patient is requesting a Covid test.  He denies any current Covid symptoms, however he does work [employer] at a [employer] and is concerned for possible exposure.  Past Medical History:  Diagnosis Date  . Acute on chronic renal insufficiency 07/15/2013  . Anemia   . Edema of lower extremity 08/06/2011  . Enteritis 07/15/2013  . ESRD (end stage renal disease) on dialysis [facility])    "[employer]; MWF" (07/15/2018)  . Headache   . Hypertension 08/09/2011  . Nephrotic syndrome   . Scrotal edema 08/06/2011  . Syncope 08/03/2013  . UTI (urinary tract infection)     Patient Active Problem List   Diagnosis Date Noted  . Hyperkalemia   . ESRD needing dialysis ([place]) 11/03/2018  . ESRD (end stage renal disease) ([place]) 07/15/2018  . Hyperparathyroidism ([place]) 11/06/2017  . Systolic heart failure, chronic ([place]) 11/06/2017  . Hypotension of hemodialysis  08/03/2013  . Anemia in chronic kidney disease 07/16/2013  . Uremia 07/15/2013  . ESRD on dialysis ([place]) 07/08/2013  . FSGS (focal segmental glomerulosclerosis) with nephrosis 08/15/2011  . Hypertension 08/09/2011  . Hyperlipidemia 08/07/2011  . Nephrotic syndrome 08/06/2011  . Serum albumin decreased 08/06/2011  . Proteinuria 08/06/2011  . Hypokalemia 08/06/2011    Past Surgical History:  Procedure Laterality Date  . AV FISTULA PLACEMENT Left 07/18/2013   Procedure: ARTERIOVENOUS (AV) FISTULA CREATION ;  Surgeon: Rosetta Posner, MD;  Location: [place];  Service: Vascular;  Laterality: Left;  . INSERTION OF DIALYSIS CATHETER Right 07/18/2013   Procedure: INSERTION OF DIALYSIS CATHETER;  Surgeon: Rosetta Posner, MD;  Location: [place];  Service: Vascular;  Laterality: Right;  . RENAL BIOPSY    . REVISON OF ARTERIOVENOUS FISTULA Left 07/15/2018   FOREARM  . REVISON OF ARTERIOVENOUS FISTULA Left 07/15/2018   Procedure: REVISION OF ARTERIOVENOUS FISTULA LEFT FOREARM;  Surgeon: Waynetta Sandy, MD;  Location: [place];  Service: Vascular;  Laterality: Left;        Home Medications    Prior to Admission medications   Medication Sig Start Date End Date Taking? Authorizing Provider  FOSRENOL 1000 MG PACK Take 1,000 mg by mouth 2 (two) times daily with a meal.  08/06/17   [provider]    Family History Family History  Problem Relation Age of Onset  .  Hypertension Mother     Social History Social History   Tobacco Use  . Smoking status: Passive Smoke Exposure - Never Smoker  . Smokeless tobacco: Never Used  Substance Use Topics  . Alcohol use: Not Currently    Alcohol/week: 1.0 standard drinks    Types: 1 Shots of liquor per week  . Drug use: No     Allergies   Patient has no known allergies.   Review of Systems Review of Systems  HENT: Positive for ear pain.   All other systems reviewed and are negative.    Physical Exam Updated Vital Signs BP  101/76 (BP Location: Right Arm)   Pulse (!) 109   Temp 98.9 F (37.2 C) (Oral)   Resp 16   SpO2 95%   Physical Exam Vitals signs and nursing note reviewed.  Constitutional:      Appearance: He is well-developed.  HENT:     Head: Normocephalic and atraumatic.     Ears:     Comments: Left earlobe with noted swelling, there appears to be an open site along the posterior earlobe and dried blood surrounding, area is overall soft, no surrounding induration or signs of cellulitis, no postauricular lymphadenopathy Eyes:     Conjunctiva/sclera: Conjunctivae normal.     Pupils: Pupils are equal, round, and reactive to light.  Neck:     Musculoskeletal: Normal range of motion.  Cardiovascular:     Rate and Rhythm: Normal rate and regular rhythm.     Heart sounds: Normal heart sounds.  Pulmonary:     Effort: Pulmonary effort is normal.     Breath sounds: Normal breath sounds.  Abdominal:     General: Bowel sounds are normal.     Palpations: Abdomen is soft.  Musculoskeletal: Normal range of motion.  Skin:    General: Skin is warm and dry.  Neurological:     Mental Status: He is alert and oriented to person, place, and time.      ED Treatments / Results  Labs (all labs ordered are listed, but only abnormal results are displayed) Labs Reviewed  NOVEL CORONAVIRUS, NAA (HOSP ORDER, SEND-OUT TO REF LAB; TAT 18-24 HRS)    EKG None  Radiology No results found.  Procedures Procedures (including critical care time)  Medications Ordered in ED Medications - No data to display   Initial Impression / Assessment and Plan / ED Course  I have reviewed the triage vital signs and the nursing notes.  Pertinent labs & imaging results that were available during my care of the patient were reviewed by me and considered in my medical decision making (see chart for details).  33 year old male here with cyst of left earlobe.  He was seen by ENT last week and scheduled for outpatient  removal, however has not been given date of surgery yet.  States today started having some drainage and some mild bleeding from the area.  On exam he does have cystic structure to the left earlobe with open area along posterior aspect and dried blood surrounding.  There is no cellulitic changes or associated lymphadenopathy.  He has not had any fevers and is nontoxic in appearance here.  Will start on course of renally dosed Keflex given his ESRD status, encouraged to follow-up closely with ENT.  He did request Covid test here.  He is not having any current Covid symptoms such as chest pain, shortness of breath, cough, or sore throat.  States he is just concerned for exposure as  he does work at a [employer].  Covid test has been sent, he will be notified of results in the next 24 hours or so.  Follow-up with PCP.  Return here for any new or acute changes.  Final Clinical Impressions(s) / ED Diagnoses   Final diagnoses:  Left ear pain    ED Discharge Orders         Ordered    cephALEXin (KEFLEX) 500 MG capsule  2 times daily     07/19/19 0006           Larene Pickett, PA-C 07/19/19 0023    Ward, Delice Bison, DO 07/19/19 [phone]

## 2019-07-20 ENCOUNTER — Other Ambulatory Visit: Payer: Self-pay | Admitting: Otolaryngology

## 2019-07-20 DIAGNOSIS — N2581 Secondary hyperparathyroidism of renal origin: Secondary | ICD-10-CM | POA: Diagnosis not present

## 2019-07-20 DIAGNOSIS — Z992 Dependence on renal dialysis: Secondary | ICD-10-CM | POA: Diagnosis not present

## 2019-07-20 DIAGNOSIS — D509 Iron deficiency anemia, unspecified: Secondary | ICD-10-CM | POA: Diagnosis not present

## 2019-07-20 DIAGNOSIS — D631 Anemia in chronic kidney disease: Secondary | ICD-10-CM | POA: Diagnosis not present

## 2019-07-20 DIAGNOSIS — N186 End stage renal disease: Secondary | ICD-10-CM | POA: Diagnosis not present

## 2019-07-20 LAB — NOVEL CORONAVIRUS, NAA (HOSP ORDER, SEND-OUT TO REF LAB; TAT 18-24 HRS): SARS-CoV-2, NAA: NOT DETECTED

## 2019-07-20 NOTE — Progress Notes (Signed)
Spoke with pt 10/20 and pre op call complete. Because pt went to the ED and had covid test 10/19 he is unwilling to go 10/23 for pre surgical covid screening.  Explained why this was required to the pt as well as the quarantine from time of retest until [place], pt would not agree and only laughed. Pt has dialysis on Monday 10/26- asked him if he would be able to obtain a copy of his labs from that day or to come in on 10/26 following dialysis for BMET. Pt refused to do either. He states that he don't get labs at dialysis on Mondays.    I spoke with Anderson Malta at Dr Janeice Robinson office and let her know that until pt is able to complete BMET, Covid test and agree to quarantine he is not able to be posted here for surgery.

## 2019-07-20 NOTE — Progress Notes (Signed)
Lattie Haw at Dr Janeice Robinson office spoke with Pt. Case has been changed to local so no PAT appt for BMET needed. Pt has agreed to go for Covid test Friday and quarantine until [employer].

## 2019-07-21 NOTE — H&P (Signed)
HPI:   Christopher Wang is a 33 y.o. male who presents as a return Patient.   Current problem: Ear lump.  HPI: Return visit he has now having swelling of the left ear. It is not hurting. It has never drained. It has been present for several months intermittently as far as he can recall. He is on hemodialysis Monday Wednesday and Friday every week.  PMH/Meds/All/SocHx/FamHx/ROS:   Past Medical History:  Diagnosis Date  . Anemia  . End stage kidney disease ([place]) 2014  . Hemodialysis access site with mature fistula ([place]) 2014  . Hemodialysis status ([place]) 2014  . Hyperlipidemia  . Hyperparathyroidism ([place])  . Hypertension 2014  . Non-compliance with treatment  . Systolic heart failure, chronic ([place]) 2014  Now resolved after starting dialysis   Past Surgical History:  Procedure Laterality Date  . AV FISTULA PLACEMENT Left 2014   No family history of bleeding disorders, wound healing problems or difficulty with anesthesia.   Social History   Socioeconomic History  . Marital status: Single  Spouse name: Not on file  . Number of children: Not on file  . Years of education: Not on file  . Highest education level: Not on file  Occupational History  . Not on file  Social Needs  . Financial resource strain: Not on file  . Food insecurity  Worry: Not on file  Inability: Not on file  . Transportation needs  Medical: Not on file  Non-medical: Not on file  Tobacco Use  . Smoking status: Passive Smoke Exposure - Never Smoker  . Smokeless tobacco: Never Used  Substance and Sexual Activity  . Alcohol use: No  . Drug use: No  . Sexual activity: Yes  Lifestyle  . Physical activity  Days per week: Not on file  Minutes per session: Not on file  . Stress: Not on file  Relationships  . Social Medical illustrator on phone: Not on file  Gets together: Not on file  Attends religious service: Not on file  Active member of club or organization: Not on file  Attends meetings of clubs  or organizations: Not on file  Relationship status: Not on file  Other Topics Concern  . Not on file  Social History Narrative  . Not on file   Current Outpatient Medications:  . calcium acetate (PHOSLO) 667 mg capsule, Take 1,334 mg by mouth 3 times daily. Take 2 caps by mouth three times daily with meals, Disp: , Rfl:  . lanthanum (FOSRENOL) 1000 MG chewable tablet, Take 1,000 mg by mouth 2 times daily. Take 2 caps with meals and one cap with snacks, Disp: , Rfl:  . lisinopril (PRINIVIL,ZESTRIL) 20 MG tablet, Take 20 mg by mouth nightly., Disp: , Rfl:  . sucroferric oxyhydroxide (VELPHORO) 500 mg Chew, Take 500 mg by mouth 3 times daily. Take two tablets three time daily with meals and two tablets with snacks, Disp: , Rfl:    Physical Exam:   Healthy-appearing gentleman, in no distress. Breathing and speaking normally. External ears look normal except on the left side there is a 1.5 cm soft doughy cystic mass within the attachment of the lobule. There is no other masses palpable in his neck.  Independent Review of Additional Tests or Records:  none  Procedures:  none  Impression & Plans:  Probable epidermoid cyst. Recommend surgical excision under local anesthetic at the outpatient center. We will arrange to have this done on one of his off days for dialysis. Risks and  benefits were discussed. All questions were answered.    HPI:   Christopher Wang is a 33 y.o. male who presents as a new Patient.   Referring Provider: Self, A Referral  Chief complaint: Left ear pain.  HPI: LEFT ear pain for about 1 week. Denies loss of hearing. Denies any toothache, TMJ symptoms, neck ache. Sore throat. He is not a Q-tip user.  PMH/Meds/All/SocHx/FamHx/ROS:   Past Medical History:  Diagnosis Date  . Anemia  . End stage kidney disease ([place]) 2014  . Hemodialysis access site with mature fistula ([place]) 2014  . Hemodialysis status ([place]) 2014  . Hyperlipidemia  . Hyperparathyroidism ([place])  .  Hypertension 2014  . Non-compliance with treatment  . Systolic heart failure, chronic ([place]) 2014  Now resolved after starting dialysis   Past Surgical History:  Procedure Laterality Date  . AV FISTULA PLACEMENT Left 2014   No family history of bleeding disorders, wound healing problems or difficulty with anesthesia.   Social History   Socioeconomic History  . Marital status: Single  Spouse name: Not on file  . Number of children: Not on file  . Years of education: Not on file  . Highest education level: Not on file  Occupational History  . Not on file  Social Needs  . Financial resource strain: Not on file  . Food insecurity  Worry: Not on file  Inability: Not on file  . Transportation needs  Medical: Not on file  Non-medical: Not on file  Tobacco Use  . Smoking status: Passive Smoke Exposure - Never Smoker  . Smokeless tobacco: Never Used  Substance and Sexual Activity  . Alcohol use: No  . Drug use: No  . Sexual activity: Yes  Lifestyle  . Physical activity  Days per week: Not on file  Minutes per session: Not on file  . Stress: Not on file  Relationships  . Social Medical illustrator on phone: Not on file  Gets together: Not on file  Attends religious service: Not on file  Active member of club or organization: Not on file  Attends meetings of clubs or organizations: Not on file  Relationship status: Not on file  Other Topics Concern  . Not on file  Social History Narrative  . Not on file   Current Outpatient Medications:  . lanthanum (FOSRENOL) 1000 MG chewable tablet, Take 1,000 mg by mouth 2 times daily. Take 2 caps with meals and one cap with snacks, Disp: , Rfl:  . calcium acetate (PHOSLO) 667 mg capsule, Take 1,334 mg by mouth 3 times daily. Take 2 caps by mouth three times daily with meals, Disp: , Rfl:  . lisinopril (PRINIVIL,ZESTRIL) 20 MG tablet, Take 20 mg by mouth nightly., Disp: , Rfl:  . sucroferric oxyhydroxide (VELPHORO) 500 mg Chew, Take  500 mg by mouth 3 times daily. Take two tablets three time daily with meals and two tablets with snacks, Disp: , Rfl:   A complete ROS was performed with pertinent positives/negatives noted in the HPI. The remainder of the ROS are negative.   Physical Exam:   BP 107/63  Pulse (!) 144  Temp 97.9 F (36.6 C)  Ht 1.829 m (6')  Wt 72.6 kg (160 lb)  BMI 21.70 kg/m   General: Healthy and alert, in no distress, breathing easily. Normal affect. In a pleasant mood. Head: Normocephalic, atraumatic. No masses, or scars. Eyes: Pupils are equal, and reactive to light. Vision is grossly intact. No spontaneous or gaze nystagmus. Ears: Ear  canals are clear. Tympanic membranes are intact, with normal landmarks and the middle ears are clear and healthy. Hearing: Grossly normal. Nose: Nasal cavities are clear with healthy mucosa, no polyps or exudate. Airways are patent. Face: No masses or scars, facial nerve function is symmetric. There may be very slight subluxation of the TMJ when he opens and closes his mouth. Oral Cavity: No mucosal abnormalities are noted. Tongue with normal mobility. Dentition appears healthy. Oropharynx: Tonsils are symmetric. There are no mucosal masses identified. Tongue base appears normal and healthy. Larynx/Hypopharynx: deferred Chest: Deferred Neck: No palpable masses, no cervical adenopathy, no thyroid nodules or enlargement. Neuro: Cranial nerves II-XII with normal function. Balance: Normal gate. Other findings: none.  Independent Review of Additional Tests or Records:  none  Procedures:  none  Impression & Plans:  Normal examination. No identifiable source of his recent left ear pain. Recommend give it time and see if it does not resolve. Follow-up as needed. Next time he sees his dentist recommend he ask about TMJ.

## 2019-07-22 ENCOUNTER — Other Ambulatory Visit (HOSPITAL_COMMUNITY)
Admission: RE | Admit: 2019-07-22 | Discharge: 2019-07-22 | Disposition: A | Discharge status: Home / Self Care | Payer: Medicare Other | Source: Ambulatory Visit | Attending: Otolaryngology | Admitting: Otolaryngology

## 2019-07-22 DIAGNOSIS — Z20828 Contact with and (suspected) exposure to other viral communicable diseases: Secondary | ICD-10-CM | POA: Insufficient documentation

## 2019-07-22 DIAGNOSIS — Z992 Dependence on renal dialysis: Secondary | ICD-10-CM | POA: Diagnosis not present

## 2019-07-22 DIAGNOSIS — N186 End stage renal disease: Secondary | ICD-10-CM | POA: Diagnosis not present

## 2019-07-22 DIAGNOSIS — Z01812 Encounter for preprocedural laboratory examination: Secondary | ICD-10-CM | POA: Insufficient documentation

## 2019-07-22 DIAGNOSIS — D631 Anemia in chronic kidney disease: Secondary | ICD-10-CM | POA: Diagnosis not present

## 2019-07-22 DIAGNOSIS — D509 Iron deficiency anemia, unspecified: Secondary | ICD-10-CM | POA: Diagnosis not present

## 2019-07-22 DIAGNOSIS — N2581 Secondary hyperparathyroidism of renal origin: Secondary | ICD-10-CM | POA: Diagnosis not present

## 2019-07-23 LAB — NOVEL CORONAVIRUS, NAA (HOSP ORDER, SEND-OUT TO REF LAB; TAT 18-24 HRS): SARS-CoV-2, NAA: NOT DETECTED

## 2019-07-25 DIAGNOSIS — D631 Anemia in chronic kidney disease: Secondary | ICD-10-CM | POA: Diagnosis not present

## 2019-07-25 DIAGNOSIS — N2581 Secondary hyperparathyroidism of renal origin: Secondary | ICD-10-CM | POA: Diagnosis not present

## 2019-07-25 DIAGNOSIS — Z992 Dependence on renal dialysis: Secondary | ICD-10-CM | POA: Diagnosis not present

## 2019-07-25 DIAGNOSIS — N186 End stage renal disease: Secondary | ICD-10-CM | POA: Diagnosis not present

## 2019-07-25 DIAGNOSIS — D509 Iron deficiency anemia, unspecified: Secondary | ICD-10-CM | POA: Diagnosis not present

## 2019-07-26 ENCOUNTER — Encounter (HOSPITAL_BASED_OUTPATIENT_CLINIC_OR_DEPARTMENT_OTHER)
Admission: RE | Disposition: A | Discharge status: Home / Self Care | Payer: Self-pay | Source: Home / Self Care | Attending: Otolaryngology

## 2019-07-26 ENCOUNTER — Encounter (HOSPITAL_BASED_OUTPATIENT_CLINIC_OR_DEPARTMENT_OTHER): Payer: Self-pay | Admitting: *Deleted

## 2019-07-26 ENCOUNTER — Ambulatory Visit (HOSPITAL_BASED_OUTPATIENT_CLINIC_OR_DEPARTMENT_OTHER)
Admission: RE | Admit: 2019-07-26 | Discharge: 2019-07-26 | Disposition: A | Discharge status: Home / Self Care | Payer: Medicare Other | Attending: Otolaryngology | Admitting: Otolaryngology

## 2019-07-26 DIAGNOSIS — L720 Epidermal cyst: Secondary | ICD-10-CM | POA: Diagnosis not present

## 2019-07-26 DIAGNOSIS — N186 End stage renal disease: Secondary | ICD-10-CM | POA: Insufficient documentation

## 2019-07-26 DIAGNOSIS — I120 Hypertensive chronic kidney disease with stage 5 chronic kidney disease or end stage renal disease: Secondary | ICD-10-CM | POA: Diagnosis not present

## 2019-07-26 DIAGNOSIS — Z79899 Other long term (current) drug therapy: Secondary | ICD-10-CM | POA: Insufficient documentation

## 2019-07-26 DIAGNOSIS — Z7722 Contact with and (suspected) exposure to environmental tobacco smoke (acute) (chronic): Secondary | ICD-10-CM | POA: Diagnosis not present

## 2019-07-26 DIAGNOSIS — Z992 Dependence on renal dialysis: Secondary | ICD-10-CM | POA: Diagnosis not present

## 2019-07-26 DIAGNOSIS — E785 Hyperlipidemia, unspecified: Secondary | ICD-10-CM | POA: Insufficient documentation

## 2019-07-26 DIAGNOSIS — H6690 Otitis media, unspecified, unspecified ear: Secondary | ICD-10-CM | POA: Diagnosis not present

## 2019-07-26 DIAGNOSIS — L729 Follicular cyst of the skin and subcutaneous tissue, unspecified: Secondary | ICD-10-CM | POA: Diagnosis not present

## 2019-07-26 DIAGNOSIS — Z9119 Patient's noncompliance with other medical treatment and regimen: Secondary | ICD-10-CM | POA: Diagnosis not present

## 2019-07-26 HISTORY — PX: MASS EXCISION: SHX2000

## 2019-07-26 SURGERY — MINOR EXCISION OF MASS
Anesthesia: LOCAL | Site: Ear | Laterality: Left

## 2019-07-26 MED ORDER — SILVER NITRATE-POT NITRATE 75-25 % EX MISC
CUTANEOUS | Status: DC | PRN
  Administered 2019-07-26: 1

## 2019-07-26 MED ORDER — LIDOCAINE-EPINEPHRINE 1 %-1:100000 IJ SOLN
INTRAMUSCULAR | Status: AC
  Filled 2019-07-26: qty 1

## 2019-07-26 MED ORDER — BUPIVACAINE HCL (PF) 0.25 % IJ SOLN
INTRAMUSCULAR | Status: AC
  Filled 2019-07-26: qty 30

## 2019-07-26 MED ORDER — LIDOCAINE-EPINEPHRINE 1 %-1:100000 IJ SOLN
INTRAMUSCULAR | Status: DC | PRN
  Administered 2019-07-26: 1.5 mL

## 2019-07-26 MED ORDER — CIPROFLOXACIN-DEXAMETHASONE 0.3-0.1 % OT SUSP
OTIC | Status: AC
  Filled 2019-07-26: qty 7.5

## 2019-07-26 MED ORDER — BSS IO SOLN
INTRAOCULAR | Status: AC
  Filled 2019-07-26: qty 15

## 2019-07-26 MED ORDER — SILVER NITRATE-POT NITRATE 75-25 % EX MISC
CUTANEOUS | Status: AC
  Filled 2019-07-26: qty 1

## 2019-07-26 MED ORDER — EPINEPHRINE PF 1 MG/ML IJ SOLN
INTRAMUSCULAR | Status: AC
  Filled 2019-07-26: qty 2

## 2019-07-26 MED ORDER — BUPIVACAINE HCL (PF) 0.5 % IJ SOLN
INTRAMUSCULAR | Status: AC
  Filled 2019-07-26: qty 30

## 2019-07-26 MED ORDER — BACITRACIN ZINC 500 UNIT/GM EX OINT
TOPICAL_OINTMENT | CUTANEOUS | Status: AC
  Filled 2019-07-26: qty 1.8

## 2019-07-26 MED ORDER — METHYLENE BLUE 0.5 % INJ SOLN
INTRAVENOUS | Status: AC
  Filled 2019-07-26: qty 10

## 2019-07-26 SURGICAL SUPPLY — 43 items
APPLICATOR COTTON TIP 6 STRL (MISCELLANEOUS) IMPLANT
APPLICATOR COTTON TIP 6IN STRL (MISCELLANEOUS)
BLADE SURG 15 STRL LF DISP TIS (BLADE) ×2 IMPLANT
BLADE SURG 15 STRL SS (BLADE) ×1
CANISTER SUCT 1200ML W/VALVE (MISCELLANEOUS) ×3 IMPLANT
CLEANER CAUTERY TIP 5X5 PAD (MISCELLANEOUS) IMPLANT
COTTONBALL LRG STERILE PKG (GAUZE/BANDAGES/DRESSINGS) ×3 IMPLANT
COVER BACK TABLE REUSABLE LG (DRAPES) ×3 IMPLANT
COVER MAYO STAND REUSABLE (DRAPES) ×3 IMPLANT
COVER WAND RF STERILE (DRAPES) IMPLANT
DECANTER SPIKE VIAL GLASS SM (MISCELLANEOUS) ×3 IMPLANT
DERMABOND ADVANCED (GAUZE/BANDAGES/DRESSINGS) ×1
DERMABOND ADVANCED .7 DNX12 (GAUZE/BANDAGES/DRESSINGS) IMPLANT
DRAPE HALF SHEET 70X43 (DRAPES) ×3 IMPLANT
ELECT COATED BLADE 2.86 ST (ELECTRODE) IMPLANT
ELECT NDL BLADE 2-5/6 (NEEDLE) ×2 IMPLANT
ELECT NEEDLE BLADE 2-5/6 (NEEDLE) ×3 IMPLANT
ELECT REM PT RETURN 9FT ADLT (ELECTROSURGICAL) ×3
ELECTRODE REM PT RTRN 9FT ADLT (ELECTROSURGICAL) ×2 IMPLANT
GLOVE BIOGEL PI IND STRL 7.0 (GLOVE) IMPLANT
GLOVE BIOGEL PI INDICATOR 7.0 (GLOVE) ×2
GLOVE ECLIPSE 7.5 STRL STRAW (GLOVE) ×3 IMPLANT
GOWN STRL REUS W/ TWL LRG LVL3 (GOWN DISPOSABLE) IMPLANT
GOWN STRL REUS W/TWL LRG LVL3 (GOWN DISPOSABLE) ×1
IV SET EXT 30 76VOL 4 MALE LL (IV SETS) ×3 IMPLANT
MARKER SKIN DUAL TIP RULER LAB (MISCELLANEOUS) IMPLANT
NDL PRECISIONGLIDE 27X1.5 (NEEDLE) ×2 IMPLANT
NEEDLE PRECISIONGLIDE 27X1.5 (NEEDLE) ×3 IMPLANT
NS IRRIG 1000ML POUR BTL (IV SOLUTION) ×1 IMPLANT
PACK BASIN DAY SURGERY FS (CUSTOM PROCEDURE TRAY) ×3 IMPLANT
PAD CLEANER CAUTERY TIP 5X5 (MISCELLANEOUS)
PENCIL FOOT CONTROL (ELECTRODE) ×3 IMPLANT
SPONGE SURGIFOAM ABS GEL 12-7 (HEMOSTASIS) IMPLANT
SUT CHROMIC 4 0 P 3 18 (SUTURE) IMPLANT
SUT CHROMIC 5 0 P 3 (SUTURE) ×1 IMPLANT
SUT PLAIN 5 0 P 3 18 (SUTURE) IMPLANT
SUT VIC AB 5-0 P-3 18X BRD (SUTURE) IMPLANT
SUT VIC AB 5-0 P3 18 (SUTURE)
SWABSTICK POVIDONE IODINE SNGL (MISCELLANEOUS) ×2 IMPLANT
SYR CONTROL 10ML LL (SYRINGE) ×3 IMPLANT
TOWEL GREEN STERILE FF (TOWEL DISPOSABLE) ×3 IMPLANT
TRAY DSU PREP LF (CUSTOM PROCEDURE TRAY) IMPLANT
TUBE CONNECTING 20X1/4 (TUBING) ×2 IMPLANT

## 2019-07-26 NOTE — Discharge Instructions (Signed)
Keep the area clean and dry.  It is okay to use soap and water but do not use any creams or oils or ointments.  Take the antibiotics that you have for the next 5 days.  Call us if there is any swelling or drainage.  Use Tylenol/Motrin as needed for pain.

## 2019-07-26 NOTE — Op Note (Signed)
OPERATIVE REPORT  DATE OF SURGERY: 07/26/2019  PATIENT:  Christopher Wang,  33 y.o. male  PRE-OPERATIVE DIAGNOSIS:  Epidermoid cyst left ear  POST-OPERATIVE DIAGNOSIS:  Epidermoid cyst left ear  PROCEDURE:  Procedure(s): EPIDERMOID CYST REMOVAL OF LEFT EAR  SURGEON:  Beckie Salts, MD  ASSISTANTS: None  ANESTHESIA:   Local  EBL: 5 ml  DRAINS: None  LOCAL MEDICATIONS USED: 1% Xylocaine with epinephrine  SPECIMEN: Left external ear cyst  COUNTS:  Correct  PROCEDURE DETAILS: The patient was taken to the operating room and placed on the operating table in the supine position.  The left ear was prepped and draped in a standard fashion.  The cyst was palpated between the left ear lobule and the fascial attachment.  Incision was outlined with a marking pen in the posterior aspect.  The entire area to be removed measured approximately 1.5 cm.  Local anesthetic was infiltrated.  #15 scalpel was used to incise the skin and subcutaneous tissue.  Careful dissection around the cystic mass was accomplished except in the most superior aspect where the cyst ruptured.  Straw-colored fluid was obtained.  Additional dissection was performed to remove that portion of the cyst.  The entire cyst was removed.  It did seem to attached to the anterior aspect of the piercing which was left untouched.  Silver nitrate cautery was used on the wound for hemostasis.  The wound was closed in layers using interrupted 5-0 chromic gut.  Dermabond was used on the skin.  Patient was discharged in stable condition.    PATIENT DISPOSITION:  To PACU, stable

## 2019-07-26 NOTE — Interval H&P Note (Signed)
History and Physical Interval Note:  07/26/2019 7:17 AM  Christopher Wang  has presented today for surgery, with the diagnosis of Epidermoid cyst left ear.  The various methods of treatment have been discussed with the patient and family. After consideration of risks, benefits and other options for treatment, the patient has consented to  Procedure(s): EPIDERMOID CYST REMOVAL OF LEFT EAR (Left) as a surgical intervention.  The patient's history has been reviewed, patient examined, no change in status, stable for surgery.  I have reviewed the patient's chart and labs.  Questions were answered to the patient's satisfaction.     Izora Gala

## 2019-07-27 ENCOUNTER — Encounter (HOSPITAL_BASED_OUTPATIENT_CLINIC_OR_DEPARTMENT_OTHER): Payer: Self-pay | Admitting: Otolaryngology

## 2019-07-27 DIAGNOSIS — Z992 Dependence on renal dialysis: Secondary | ICD-10-CM | POA: Diagnosis not present

## 2019-07-27 DIAGNOSIS — D509 Iron deficiency anemia, unspecified: Secondary | ICD-10-CM | POA: Diagnosis not present

## 2019-07-27 DIAGNOSIS — N2581 Secondary hyperparathyroidism of renal origin: Secondary | ICD-10-CM | POA: Diagnosis not present

## 2019-07-27 DIAGNOSIS — N186 End stage renal disease: Secondary | ICD-10-CM | POA: Diagnosis not present

## 2019-07-27 DIAGNOSIS — D631 Anemia in chronic kidney disease: Secondary | ICD-10-CM | POA: Diagnosis not present

## 2019-07-28 LAB — SURGICAL PATHOLOGY

## 2019-07-29 DIAGNOSIS — N186 End stage renal disease: Secondary | ICD-10-CM | POA: Diagnosis not present

## 2019-07-29 DIAGNOSIS — N2581 Secondary hyperparathyroidism of renal origin: Secondary | ICD-10-CM | POA: Diagnosis not present

## 2019-07-29 DIAGNOSIS — D509 Iron deficiency anemia, unspecified: Secondary | ICD-10-CM | POA: Diagnosis not present

## 2019-07-29 DIAGNOSIS — D631 Anemia in chronic kidney disease: Secondary | ICD-10-CM | POA: Diagnosis not present

## 2019-07-29 DIAGNOSIS — Z992 Dependence on renal dialysis: Secondary | ICD-10-CM | POA: Diagnosis not present

## 2019-07-31 DIAGNOSIS — N033 Chronic nephritic syndrome with diffuse mesangial proliferative glomerulonephritis: Secondary | ICD-10-CM | POA: Diagnosis not present

## 2019-07-31 DIAGNOSIS — N186 End stage renal disease: Secondary | ICD-10-CM | POA: Diagnosis not present

## 2019-07-31 DIAGNOSIS — Z992 Dependence on renal dialysis: Secondary | ICD-10-CM | POA: Diagnosis not present

## 2019-08-01 DIAGNOSIS — Z992 Dependence on renal dialysis: Secondary | ICD-10-CM | POA: Diagnosis not present

## 2019-08-01 DIAGNOSIS — D631 Anemia in chronic kidney disease: Secondary | ICD-10-CM | POA: Diagnosis not present

## 2019-08-01 DIAGNOSIS — N2581 Secondary hyperparathyroidism of renal origin: Secondary | ICD-10-CM | POA: Diagnosis not present

## 2019-08-01 DIAGNOSIS — N186 End stage renal disease: Secondary | ICD-10-CM | POA: Diagnosis not present

## 2019-08-01 DIAGNOSIS — D509 Iron deficiency anemia, unspecified: Secondary | ICD-10-CM | POA: Diagnosis not present

## 2019-08-03 DIAGNOSIS — Z992 Dependence on renal dialysis: Secondary | ICD-10-CM | POA: Diagnosis not present

## 2019-08-03 DIAGNOSIS — N2581 Secondary hyperparathyroidism of renal origin: Secondary | ICD-10-CM | POA: Diagnosis not present

## 2019-08-03 DIAGNOSIS — N186 End stage renal disease: Secondary | ICD-10-CM | POA: Diagnosis not present

## 2019-08-03 DIAGNOSIS — D509 Iron deficiency anemia, unspecified: Secondary | ICD-10-CM | POA: Diagnosis not present

## 2019-08-03 DIAGNOSIS — D631 Anemia in chronic kidney disease: Secondary | ICD-10-CM | POA: Diagnosis not present

## 2019-08-04 ENCOUNTER — Other Ambulatory Visit: Payer: Self-pay

## 2019-08-05 DIAGNOSIS — N186 End stage renal disease: Secondary | ICD-10-CM | POA: Diagnosis not present

## 2019-08-05 DIAGNOSIS — Z992 Dependence on renal dialysis: Secondary | ICD-10-CM | POA: Diagnosis not present

## 2019-08-05 DIAGNOSIS — N2581 Secondary hyperparathyroidism of renal origin: Secondary | ICD-10-CM | POA: Diagnosis not present

## 2019-08-05 DIAGNOSIS — D631 Anemia in chronic kidney disease: Secondary | ICD-10-CM | POA: Diagnosis not present

## 2019-08-05 DIAGNOSIS — D509 Iron deficiency anemia, unspecified: Secondary | ICD-10-CM | POA: Diagnosis not present

## 2019-08-08 DIAGNOSIS — N186 End stage renal disease: Secondary | ICD-10-CM | POA: Diagnosis not present

## 2019-08-08 DIAGNOSIS — N2581 Secondary hyperparathyroidism of renal origin: Secondary | ICD-10-CM | POA: Diagnosis not present

## 2019-08-08 DIAGNOSIS — D631 Anemia in chronic kidney disease: Secondary | ICD-10-CM | POA: Diagnosis not present

## 2019-08-08 DIAGNOSIS — Z992 Dependence on renal dialysis: Secondary | ICD-10-CM | POA: Diagnosis not present

## 2019-08-08 DIAGNOSIS — D509 Iron deficiency anemia, unspecified: Secondary | ICD-10-CM | POA: Diagnosis not present

## 2019-08-10 DIAGNOSIS — D509 Iron deficiency anemia, unspecified: Secondary | ICD-10-CM | POA: Diagnosis not present

## 2019-08-10 DIAGNOSIS — N2581 Secondary hyperparathyroidism of renal origin: Secondary | ICD-10-CM | POA: Diagnosis not present

## 2019-08-10 DIAGNOSIS — Z992 Dependence on renal dialysis: Secondary | ICD-10-CM | POA: Diagnosis not present

## 2019-08-10 DIAGNOSIS — N186 End stage renal disease: Secondary | ICD-10-CM | POA: Diagnosis not present

## 2019-08-10 DIAGNOSIS — D631 Anemia in chronic kidney disease: Secondary | ICD-10-CM | POA: Diagnosis not present

## 2019-08-12 DIAGNOSIS — N186 End stage renal disease: Secondary | ICD-10-CM | POA: Diagnosis not present

## 2019-08-12 DIAGNOSIS — Z992 Dependence on renal dialysis: Secondary | ICD-10-CM | POA: Diagnosis not present

## 2019-08-12 DIAGNOSIS — D509 Iron deficiency anemia, unspecified: Secondary | ICD-10-CM | POA: Diagnosis not present

## 2019-08-12 DIAGNOSIS — D631 Anemia in chronic kidney disease: Secondary | ICD-10-CM | POA: Diagnosis not present

## 2019-08-12 DIAGNOSIS — N2581 Secondary hyperparathyroidism of renal origin: Secondary | ICD-10-CM | POA: Diagnosis not present

## 2019-08-15 DIAGNOSIS — N186 End stage renal disease: Secondary | ICD-10-CM | POA: Diagnosis not present

## 2019-08-15 DIAGNOSIS — D509 Iron deficiency anemia, unspecified: Secondary | ICD-10-CM | POA: Diagnosis not present

## 2019-08-15 DIAGNOSIS — N2581 Secondary hyperparathyroidism of renal origin: Secondary | ICD-10-CM | POA: Diagnosis not present

## 2019-08-15 DIAGNOSIS — D631 Anemia in chronic kidney disease: Secondary | ICD-10-CM | POA: Diagnosis not present

## 2019-08-15 DIAGNOSIS — Z992 Dependence on renal dialysis: Secondary | ICD-10-CM | POA: Diagnosis not present

## 2019-08-17 DIAGNOSIS — N186 End stage renal disease: Secondary | ICD-10-CM | POA: Diagnosis not present

## 2019-08-17 DIAGNOSIS — N2581 Secondary hyperparathyroidism of renal origin: Secondary | ICD-10-CM | POA: Diagnosis not present

## 2019-08-17 DIAGNOSIS — D509 Iron deficiency anemia, unspecified: Secondary | ICD-10-CM | POA: Diagnosis not present

## 2019-08-17 DIAGNOSIS — D631 Anemia in chronic kidney disease: Secondary | ICD-10-CM | POA: Diagnosis not present

## 2019-08-17 DIAGNOSIS — Z992 Dependence on renal dialysis: Secondary | ICD-10-CM | POA: Diagnosis not present

## 2019-08-19 DIAGNOSIS — D509 Iron deficiency anemia, unspecified: Secondary | ICD-10-CM | POA: Diagnosis not present

## 2019-08-19 DIAGNOSIS — D631 Anemia in chronic kidney disease: Secondary | ICD-10-CM | POA: Diagnosis not present

## 2019-08-19 DIAGNOSIS — N2581 Secondary hyperparathyroidism of renal origin: Secondary | ICD-10-CM | POA: Diagnosis not present

## 2019-08-19 DIAGNOSIS — Z992 Dependence on renal dialysis: Secondary | ICD-10-CM | POA: Diagnosis not present

## 2019-08-19 DIAGNOSIS — N186 End stage renal disease: Secondary | ICD-10-CM | POA: Diagnosis not present

## 2019-08-21 DIAGNOSIS — N186 End stage renal disease: Secondary | ICD-10-CM | POA: Diagnosis not present

## 2019-08-21 DIAGNOSIS — Z992 Dependence on renal dialysis: Secondary | ICD-10-CM | POA: Diagnosis not present

## 2019-08-21 DIAGNOSIS — D509 Iron deficiency anemia, unspecified: Secondary | ICD-10-CM | POA: Diagnosis not present

## 2019-08-21 DIAGNOSIS — N2581 Secondary hyperparathyroidism of renal origin: Secondary | ICD-10-CM | POA: Diagnosis not present

## 2019-08-21 DIAGNOSIS — D631 Anemia in chronic kidney disease: Secondary | ICD-10-CM | POA: Diagnosis not present

## 2019-08-23 DIAGNOSIS — N2581 Secondary hyperparathyroidism of renal origin: Secondary | ICD-10-CM | POA: Diagnosis not present

## 2019-08-23 DIAGNOSIS — D631 Anemia in chronic kidney disease: Secondary | ICD-10-CM | POA: Diagnosis not present

## 2019-08-23 DIAGNOSIS — N186 End stage renal disease: Secondary | ICD-10-CM | POA: Diagnosis not present

## 2019-08-23 DIAGNOSIS — D509 Iron deficiency anemia, unspecified: Secondary | ICD-10-CM | POA: Diagnosis not present

## 2019-08-23 DIAGNOSIS — Z992 Dependence on renal dialysis: Secondary | ICD-10-CM | POA: Diagnosis not present

## 2019-08-26 DIAGNOSIS — D509 Iron deficiency anemia, unspecified: Secondary | ICD-10-CM | POA: Diagnosis not present

## 2019-08-26 DIAGNOSIS — Z992 Dependence on renal dialysis: Secondary | ICD-10-CM | POA: Diagnosis not present

## 2019-08-26 DIAGNOSIS — N186 End stage renal disease: Secondary | ICD-10-CM | POA: Diagnosis not present

## 2019-08-26 DIAGNOSIS — D631 Anemia in chronic kidney disease: Secondary | ICD-10-CM | POA: Diagnosis not present

## 2019-08-26 DIAGNOSIS — N2581 Secondary hyperparathyroidism of renal origin: Secondary | ICD-10-CM | POA: Diagnosis not present

## 2019-08-29 DIAGNOSIS — N186 End stage renal disease: Secondary | ICD-10-CM | POA: Diagnosis not present

## 2019-08-29 DIAGNOSIS — D509 Iron deficiency anemia, unspecified: Secondary | ICD-10-CM | POA: Diagnosis not present

## 2019-08-29 DIAGNOSIS — D631 Anemia in chronic kidney disease: Secondary | ICD-10-CM | POA: Diagnosis not present

## 2019-08-29 DIAGNOSIS — Z992 Dependence on renal dialysis: Secondary | ICD-10-CM | POA: Diagnosis not present

## 2019-08-29 DIAGNOSIS — N2581 Secondary hyperparathyroidism of renal origin: Secondary | ICD-10-CM | POA: Diagnosis not present

## 2019-08-31 DIAGNOSIS — Z992 Dependence on renal dialysis: Secondary | ICD-10-CM | POA: Diagnosis not present

## 2019-08-31 DIAGNOSIS — N2581 Secondary hyperparathyroidism of renal origin: Secondary | ICD-10-CM | POA: Diagnosis not present

## 2019-08-31 DIAGNOSIS — N186 End stage renal disease: Secondary | ICD-10-CM | POA: Diagnosis not present

## 2019-08-31 DIAGNOSIS — D631 Anemia in chronic kidney disease: Secondary | ICD-10-CM | POA: Diagnosis not present

## 2019-09-02 DIAGNOSIS — D631 Anemia in chronic kidney disease: Secondary | ICD-10-CM | POA: Diagnosis not present

## 2019-09-02 DIAGNOSIS — N186 End stage renal disease: Secondary | ICD-10-CM | POA: Diagnosis not present

## 2019-09-02 DIAGNOSIS — N2581 Secondary hyperparathyroidism of renal origin: Secondary | ICD-10-CM | POA: Diagnosis not present

## 2019-09-02 DIAGNOSIS — Z992 Dependence on renal dialysis: Secondary | ICD-10-CM | POA: Diagnosis not present

## 2019-09-05 DIAGNOSIS — Z992 Dependence on renal dialysis: Secondary | ICD-10-CM | POA: Diagnosis not present

## 2019-09-05 DIAGNOSIS — N186 End stage renal disease: Secondary | ICD-10-CM | POA: Diagnosis not present

## 2019-09-05 DIAGNOSIS — N2581 Secondary hyperparathyroidism of renal origin: Secondary | ICD-10-CM | POA: Diagnosis not present

## 2019-09-05 DIAGNOSIS — D631 Anemia in chronic kidney disease: Secondary | ICD-10-CM | POA: Diagnosis not present

## 2019-09-07 DIAGNOSIS — N2581 Secondary hyperparathyroidism of renal origin: Secondary | ICD-10-CM | POA: Diagnosis not present

## 2019-09-07 DIAGNOSIS — Z992 Dependence on renal dialysis: Secondary | ICD-10-CM | POA: Diagnosis not present

## 2019-09-07 DIAGNOSIS — D631 Anemia in chronic kidney disease: Secondary | ICD-10-CM | POA: Diagnosis not present

## 2019-09-07 DIAGNOSIS — N186 End stage renal disease: Secondary | ICD-10-CM | POA: Diagnosis not present

## 2019-09-09 DIAGNOSIS — D631 Anemia in chronic kidney disease: Secondary | ICD-10-CM | POA: Diagnosis not present

## 2019-09-09 DIAGNOSIS — N2581 Secondary hyperparathyroidism of renal origin: Secondary | ICD-10-CM | POA: Diagnosis not present

## 2019-09-09 DIAGNOSIS — N186 End stage renal disease: Secondary | ICD-10-CM | POA: Diagnosis not present

## 2019-09-09 DIAGNOSIS — Z992 Dependence on renal dialysis: Secondary | ICD-10-CM | POA: Diagnosis not present

## 2019-09-12 DIAGNOSIS — N186 End stage renal disease: Secondary | ICD-10-CM | POA: Diagnosis not present

## 2019-09-12 DIAGNOSIS — Z992 Dependence on renal dialysis: Secondary | ICD-10-CM | POA: Diagnosis not present

## 2019-09-12 DIAGNOSIS — D631 Anemia in chronic kidney disease: Secondary | ICD-10-CM | POA: Diagnosis not present

## 2019-09-12 DIAGNOSIS — N2581 Secondary hyperparathyroidism of renal origin: Secondary | ICD-10-CM | POA: Diagnosis not present

## 2019-09-14 DIAGNOSIS — N186 End stage renal disease: Secondary | ICD-10-CM | POA: Diagnosis not present

## 2019-09-14 DIAGNOSIS — D631 Anemia in chronic kidney disease: Secondary | ICD-10-CM | POA: Diagnosis not present

## 2019-09-14 DIAGNOSIS — N2581 Secondary hyperparathyroidism of renal origin: Secondary | ICD-10-CM | POA: Diagnosis not present

## 2019-09-14 DIAGNOSIS — Z992 Dependence on renal dialysis: Secondary | ICD-10-CM | POA: Diagnosis not present

## 2019-09-16 DIAGNOSIS — N186 End stage renal disease: Secondary | ICD-10-CM | POA: Diagnosis not present

## 2019-09-16 DIAGNOSIS — N2581 Secondary hyperparathyroidism of renal origin: Secondary | ICD-10-CM | POA: Diagnosis not present

## 2019-09-16 DIAGNOSIS — D631 Anemia in chronic kidney disease: Secondary | ICD-10-CM | POA: Diagnosis not present

## 2019-09-16 DIAGNOSIS — Z992 Dependence on renal dialysis: Secondary | ICD-10-CM | POA: Diagnosis not present

## 2019-09-19 DIAGNOSIS — Z992 Dependence on renal dialysis: Secondary | ICD-10-CM | POA: Diagnosis not present

## 2019-09-19 DIAGNOSIS — N186 End stage renal disease: Secondary | ICD-10-CM | POA: Diagnosis not present

## 2019-09-19 DIAGNOSIS — D631 Anemia in chronic kidney disease: Secondary | ICD-10-CM | POA: Diagnosis not present

## 2019-09-19 DIAGNOSIS — N2581 Secondary hyperparathyroidism of renal origin: Secondary | ICD-10-CM | POA: Diagnosis not present

## 2019-09-21 DIAGNOSIS — N2581 Secondary hyperparathyroidism of renal origin: Secondary | ICD-10-CM | POA: Diagnosis not present

## 2019-09-21 DIAGNOSIS — D631 Anemia in chronic kidney disease: Secondary | ICD-10-CM | POA: Diagnosis not present

## 2019-09-21 DIAGNOSIS — N186 End stage renal disease: Secondary | ICD-10-CM | POA: Diagnosis not present

## 2019-09-21 DIAGNOSIS — Z992 Dependence on renal dialysis: Secondary | ICD-10-CM | POA: Diagnosis not present

## 2019-09-24 DIAGNOSIS — N186 End stage renal disease: Secondary | ICD-10-CM | POA: Diagnosis not present

## 2019-09-24 DIAGNOSIS — N2581 Secondary hyperparathyroidism of renal origin: Secondary | ICD-10-CM | POA: Diagnosis not present

## 2019-09-24 DIAGNOSIS — Z992 Dependence on renal dialysis: Secondary | ICD-10-CM | POA: Diagnosis not present

## 2019-09-24 DIAGNOSIS — D631 Anemia in chronic kidney disease: Secondary | ICD-10-CM | POA: Diagnosis not present

## 2019-09-26 DIAGNOSIS — Z992 Dependence on renal dialysis: Secondary | ICD-10-CM | POA: Diagnosis not present

## 2019-09-26 DIAGNOSIS — D631 Anemia in chronic kidney disease: Secondary | ICD-10-CM | POA: Diagnosis not present

## 2019-09-26 DIAGNOSIS — N2581 Secondary hyperparathyroidism of renal origin: Secondary | ICD-10-CM | POA: Diagnosis not present

## 2019-09-26 DIAGNOSIS — N186 End stage renal disease: Secondary | ICD-10-CM | POA: Diagnosis not present

## 2019-09-28 DIAGNOSIS — Z992 Dependence on renal dialysis: Secondary | ICD-10-CM | POA: Diagnosis not present

## 2019-09-28 DIAGNOSIS — D631 Anemia in chronic kidney disease: Secondary | ICD-10-CM | POA: Diagnosis not present

## 2019-09-28 DIAGNOSIS — N186 End stage renal disease: Secondary | ICD-10-CM | POA: Diagnosis not present

## 2019-09-28 DIAGNOSIS — N2581 Secondary hyperparathyroidism of renal origin: Secondary | ICD-10-CM | POA: Diagnosis not present

## 2019-10-01 DIAGNOSIS — N2581 Secondary hyperparathyroidism of renal origin: Secondary | ICD-10-CM | POA: Diagnosis not present

## 2019-10-01 DIAGNOSIS — Z992 Dependence on renal dialysis: Secondary | ICD-10-CM | POA: Diagnosis not present

## 2019-10-01 DIAGNOSIS — N186 End stage renal disease: Secondary | ICD-10-CM | POA: Diagnosis not present

## 2019-10-03 DIAGNOSIS — N2581 Secondary hyperparathyroidism of renal origin: Secondary | ICD-10-CM | POA: Diagnosis not present

## 2019-10-03 DIAGNOSIS — N186 End stage renal disease: Secondary | ICD-10-CM | POA: Diagnosis not present

## 2019-10-03 DIAGNOSIS — Z992 Dependence on renal dialysis: Secondary | ICD-10-CM | POA: Diagnosis not present

## 2019-10-05 DIAGNOSIS — Z992 Dependence on renal dialysis: Secondary | ICD-10-CM | POA: Diagnosis not present

## 2019-10-05 DIAGNOSIS — N2581 Secondary hyperparathyroidism of renal origin: Secondary | ICD-10-CM | POA: Diagnosis not present

## 2019-10-05 DIAGNOSIS — N186 End stage renal disease: Secondary | ICD-10-CM | POA: Diagnosis not present

## 2019-10-07 DIAGNOSIS — N186 End stage renal disease: Secondary | ICD-10-CM | POA: Diagnosis not present

## 2019-10-07 DIAGNOSIS — N2581 Secondary hyperparathyroidism of renal origin: Secondary | ICD-10-CM | POA: Diagnosis not present

## 2019-10-07 DIAGNOSIS — Z992 Dependence on renal dialysis: Secondary | ICD-10-CM | POA: Diagnosis not present

## 2019-10-10 DIAGNOSIS — Z992 Dependence on renal dialysis: Secondary | ICD-10-CM | POA: Diagnosis not present

## 2019-10-10 DIAGNOSIS — N2581 Secondary hyperparathyroidism of renal origin: Secondary | ICD-10-CM | POA: Diagnosis not present

## 2019-10-10 DIAGNOSIS — N186 End stage renal disease: Secondary | ICD-10-CM | POA: Diagnosis not present

## 2019-10-12 DIAGNOSIS — N186 End stage renal disease: Secondary | ICD-10-CM | POA: Diagnosis not present

## 2019-10-12 DIAGNOSIS — Z992 Dependence on renal dialysis: Secondary | ICD-10-CM | POA: Diagnosis not present

## 2019-10-12 DIAGNOSIS — N2581 Secondary hyperparathyroidism of renal origin: Secondary | ICD-10-CM | POA: Diagnosis not present

## 2019-10-14 DIAGNOSIS — N186 End stage renal disease: Secondary | ICD-10-CM | POA: Diagnosis not present

## 2019-10-14 DIAGNOSIS — N2581 Secondary hyperparathyroidism of renal origin: Secondary | ICD-10-CM | POA: Diagnosis not present

## 2019-10-14 DIAGNOSIS — Z992 Dependence on renal dialysis: Secondary | ICD-10-CM | POA: Diagnosis not present

## 2019-10-17 DIAGNOSIS — N186 End stage renal disease: Secondary | ICD-10-CM | POA: Diagnosis not present

## 2019-10-17 DIAGNOSIS — Z992 Dependence on renal dialysis: Secondary | ICD-10-CM | POA: Diagnosis not present

## 2019-10-17 DIAGNOSIS — N2581 Secondary hyperparathyroidism of renal origin: Secondary | ICD-10-CM | POA: Diagnosis not present

## 2019-10-19 DIAGNOSIS — Z992 Dependence on renal dialysis: Secondary | ICD-10-CM | POA: Diagnosis not present

## 2019-10-19 DIAGNOSIS — N2581 Secondary hyperparathyroidism of renal origin: Secondary | ICD-10-CM | POA: Diagnosis not present

## 2019-10-19 DIAGNOSIS — N186 End stage renal disease: Secondary | ICD-10-CM | POA: Diagnosis not present

## 2019-10-21 DIAGNOSIS — N2581 Secondary hyperparathyroidism of renal origin: Secondary | ICD-10-CM | POA: Diagnosis not present

## 2019-10-21 DIAGNOSIS — N186 End stage renal disease: Secondary | ICD-10-CM | POA: Diagnosis not present

## 2019-10-21 DIAGNOSIS — Z992 Dependence on renal dialysis: Secondary | ICD-10-CM | POA: Diagnosis not present

## 2019-10-24 DIAGNOSIS — N2581 Secondary hyperparathyroidism of renal origin: Secondary | ICD-10-CM | POA: Diagnosis not present

## 2019-10-24 DIAGNOSIS — N186 End stage renal disease: Secondary | ICD-10-CM | POA: Diagnosis not present

## 2019-10-24 DIAGNOSIS — Z992 Dependence on renal dialysis: Secondary | ICD-10-CM | POA: Diagnosis not present

## 2019-10-26 DIAGNOSIS — N2581 Secondary hyperparathyroidism of renal origin: Secondary | ICD-10-CM | POA: Diagnosis not present

## 2019-10-26 DIAGNOSIS — N186 End stage renal disease: Secondary | ICD-10-CM | POA: Diagnosis not present

## 2019-10-26 DIAGNOSIS — Z992 Dependence on renal dialysis: Secondary | ICD-10-CM | POA: Diagnosis not present

## 2019-10-28 DIAGNOSIS — Z992 Dependence on renal dialysis: Secondary | ICD-10-CM | POA: Diagnosis not present

## 2019-10-28 DIAGNOSIS — N2581 Secondary hyperparathyroidism of renal origin: Secondary | ICD-10-CM | POA: Diagnosis not present

## 2019-10-28 DIAGNOSIS — N186 End stage renal disease: Secondary | ICD-10-CM | POA: Diagnosis not present

## 2019-10-30 DIAGNOSIS — N033 Chronic nephritic syndrome with diffuse mesangial proliferative glomerulonephritis: Secondary | ICD-10-CM | POA: Diagnosis not present

## 2019-10-30 DIAGNOSIS — Z992 Dependence on renal dialysis: Secondary | ICD-10-CM | POA: Diagnosis not present

## 2019-10-30 DIAGNOSIS — N186 End stage renal disease: Secondary | ICD-10-CM | POA: Diagnosis not present

## 2019-10-31 DIAGNOSIS — N033 Chronic nephritic syndrome with diffuse mesangial proliferative glomerulonephritis: Secondary | ICD-10-CM | POA: Diagnosis not present

## 2019-10-31 DIAGNOSIS — Z992 Dependence on renal dialysis: Secondary | ICD-10-CM | POA: Diagnosis not present

## 2019-10-31 DIAGNOSIS — N2581 Secondary hyperparathyroidism of renal origin: Secondary | ICD-10-CM | POA: Diagnosis not present

## 2019-10-31 DIAGNOSIS — D631 Anemia in chronic kidney disease: Secondary | ICD-10-CM | POA: Diagnosis not present

## 2019-10-31 DIAGNOSIS — N186 End stage renal disease: Secondary | ICD-10-CM | POA: Diagnosis not present

## 2019-11-01 ENCOUNTER — Ambulatory Visit: Payer: Medicare Other | Attending: Internal Medicine

## 2019-11-01 DIAGNOSIS — Z20822 Contact with and (suspected) exposure to covid-19: Secondary | ICD-10-CM | POA: Diagnosis not present

## 2019-11-02 DIAGNOSIS — Z992 Dependence on renal dialysis: Secondary | ICD-10-CM | POA: Diagnosis not present

## 2019-11-02 DIAGNOSIS — D631 Anemia in chronic kidney disease: Secondary | ICD-10-CM | POA: Diagnosis not present

## 2019-11-02 DIAGNOSIS — N2581 Secondary hyperparathyroidism of renal origin: Secondary | ICD-10-CM | POA: Diagnosis not present

## 2019-11-02 DIAGNOSIS — N186 End stage renal disease: Secondary | ICD-10-CM | POA: Diagnosis not present

## 2019-11-02 LAB — NOVEL CORONAVIRUS, NAA: SARS-CoV-2, NAA: NOT DETECTED

## 2019-11-04 ENCOUNTER — Encounter: Payer: Self-pay | Admitting: Podiatry

## 2019-11-04 DIAGNOSIS — D631 Anemia in chronic kidney disease: Secondary | ICD-10-CM | POA: Diagnosis not present

## 2019-11-04 DIAGNOSIS — N186 End stage renal disease: Secondary | ICD-10-CM | POA: Diagnosis not present

## 2019-11-04 DIAGNOSIS — Z992 Dependence on renal dialysis: Secondary | ICD-10-CM | POA: Diagnosis not present

## 2019-11-04 DIAGNOSIS — N2581 Secondary hyperparathyroidism of renal origin: Secondary | ICD-10-CM | POA: Diagnosis not present

## 2019-11-07 DIAGNOSIS — M79672 Pain in left foot: Secondary | ICD-10-CM | POA: Diagnosis not present

## 2019-11-07 DIAGNOSIS — B079 Viral wart, unspecified: Secondary | ICD-10-CM | POA: Diagnosis not present

## 2019-11-07 DIAGNOSIS — M89372 Hypertrophy of bone, left ankle and foot: Secondary | ICD-10-CM | POA: Diagnosis not present

## 2019-11-07 DIAGNOSIS — M65872 Other synovitis and tenosynovitis, left ankle and foot: Secondary | ICD-10-CM | POA: Diagnosis not present

## 2019-11-07 DIAGNOSIS — N2581 Secondary hyperparathyroidism of renal origin: Secondary | ICD-10-CM | POA: Diagnosis not present

## 2019-11-07 DIAGNOSIS — L600 Ingrowing nail: Secondary | ICD-10-CM | POA: Diagnosis not present

## 2019-11-07 DIAGNOSIS — D631 Anemia in chronic kidney disease: Secondary | ICD-10-CM | POA: Diagnosis not present

## 2019-11-07 DIAGNOSIS — Z992 Dependence on renal dialysis: Secondary | ICD-10-CM | POA: Diagnosis not present

## 2019-11-07 DIAGNOSIS — M79671 Pain in right foot: Secondary | ICD-10-CM | POA: Diagnosis not present

## 2019-11-07 DIAGNOSIS — N186 End stage renal disease: Secondary | ICD-10-CM | POA: Diagnosis not present

## 2019-11-08 ENCOUNTER — Ambulatory Visit: Payer: Medicare Other | Admitting: Podiatry

## 2019-11-09 DIAGNOSIS — Z992 Dependence on renal dialysis: Secondary | ICD-10-CM | POA: Diagnosis not present

## 2019-11-09 DIAGNOSIS — N2581 Secondary hyperparathyroidism of renal origin: Secondary | ICD-10-CM | POA: Diagnosis not present

## 2019-11-09 DIAGNOSIS — N186 End stage renal disease: Secondary | ICD-10-CM | POA: Diagnosis not present

## 2019-11-09 DIAGNOSIS — D631 Anemia in chronic kidney disease: Secondary | ICD-10-CM | POA: Diagnosis not present

## 2019-11-11 DIAGNOSIS — N186 End stage renal disease: Secondary | ICD-10-CM | POA: Diagnosis not present

## 2019-11-11 DIAGNOSIS — D631 Anemia in chronic kidney disease: Secondary | ICD-10-CM | POA: Diagnosis not present

## 2019-11-11 DIAGNOSIS — N2581 Secondary hyperparathyroidism of renal origin: Secondary | ICD-10-CM | POA: Diagnosis not present

## 2019-11-11 DIAGNOSIS — Z992 Dependence on renal dialysis: Secondary | ICD-10-CM | POA: Diagnosis not present

## 2019-11-14 DIAGNOSIS — N2581 Secondary hyperparathyroidism of renal origin: Secondary | ICD-10-CM | POA: Diagnosis not present

## 2019-11-14 DIAGNOSIS — N186 End stage renal disease: Secondary | ICD-10-CM | POA: Diagnosis not present

## 2019-11-14 DIAGNOSIS — D631 Anemia in chronic kidney disease: Secondary | ICD-10-CM | POA: Diagnosis not present

## 2019-11-14 DIAGNOSIS — Z992 Dependence on renal dialysis: Secondary | ICD-10-CM | POA: Diagnosis not present

## 2019-11-15 ENCOUNTER — Other Ambulatory Visit: Payer: Self-pay

## 2019-11-15 ENCOUNTER — Ambulatory Visit (INDEPENDENT_AMBULATORY_CARE_PROVIDER_SITE_OTHER): Payer: Medicare Other | Admitting: Physician Assistant

## 2019-11-15 VITALS — BP 104/70 | HR 87 | Temp 97.6°F | Resp 18 | Ht 72.0 in | Wt 153.5 lb

## 2019-11-15 DIAGNOSIS — Z992 Dependence on renal dialysis: Secondary | ICD-10-CM | POA: Diagnosis not present

## 2019-11-15 DIAGNOSIS — N186 End stage renal disease: Secondary | ICD-10-CM

## 2019-11-15 NOTE — Progress Notes (Signed)
    Established Dialysis Access   History of Present Illness   Christopher Wang is a 34 y.o. ([birth year]) male who presents for re-evaluation of permanent access.  He is dialyzing from left radiocephalic fistula which was plicated in October [id number] by Dr. Donzetta Matters.  He returns office with concerns of thinning skin and scabbing over area of fistula that was not previously plicated.  He also states that fistula is running well on hemodialysis on a Monday Wednesday Friday schedule.  His end-stage renal disease is managed by Dr. Jimmy Footman.  He has also been meeting with the transplant team with hopes to be on the list soon.  The patient's PMH, PSH, SH, and FamHx were reviewed on and are unchanged from prior visit.  Current Outpatient Medications  Medication Sig Dispense Refill  . acetaminophen (TYLENOL) 500 MG tablet Take 500 mg by mouth every 6 (six) hours as needed.    . cephALEXin (KEFLEX) 500 MG capsule Take 1 capsule (500 mg total) by mouth 2 (two) times daily. 20 capsule 0  . FOSRENOL 1000 MG PACK Take 1,000 mg by mouth 2 (two) times daily with a meal.      No current facility-administered medications for this visit.    On ROS today: 10 system ROS negative unless otherwise noted in HPI   Physical Examination   Vitals:   11/15/19 0947 11/15/19 0948  BP:  104/70  Pulse:  87  Resp: 18   Temp:  97.6 F (36.4 C)  TempSrc:  Temporal  SpO2:  98%  Weight: 153 lb 8 oz (69.6 kg)   Height: 6' (1.829 m)    Body mass index is 20.82 kg/m.  General Alert, O x 3, WD, NAD  Pulmonary Sym exp, good B air movt,   Cardiac RRR, Nl S1, S2,  Vascular Vessel Right Left  Radial Palpable Palpable  Brachial Palpable Palpable  Ulnar Not palpable Not palpable    Musculo- skeletal M/S 5/5 throughout  , Extremities without ischemic changes  ; Palpable thrill throughout fistula in left forearm; grip strength and sensation intact left hand; small areas of superficial scabbing with mobile skin and  "pinchable" above fistula  Neurologic A&O; CN grossly intact     Medical Decision Making   Christopher Wang is a 34 y.o. male who presents with ESRD requiring hemodialysis.    Patent left radiocephalic fistula with palpable thrill throughout left forearm  Several areas of fistula that were not previously plicated with superficial scabbing however skin is mobile overlying fistula; there is no immediate concern for spontaneous rupture or bleeding from these areas in my opinion  I explained to the patient that with another plication procedure comes risk that fistula does not function properly or even thrombosis postoperatively  For now I would recommend the HD technicians use area of fistula that was previously plicated as well as other areas along forearm that fistula is easily palpable with a thrill; area of concern should be rested for the next few weeks  Call/return to office if areas of concern worsen otherwise patient can follow-up on an as-needed basis   Dagoberto Ligas PA-C [facility] Office: [facility] MD: Carlis Abbott

## 2019-11-16 DIAGNOSIS — N2581 Secondary hyperparathyroidism of renal origin: Secondary | ICD-10-CM | POA: Diagnosis not present

## 2019-11-16 DIAGNOSIS — Z992 Dependence on renal dialysis: Secondary | ICD-10-CM | POA: Diagnosis not present

## 2019-11-16 DIAGNOSIS — N186 End stage renal disease: Secondary | ICD-10-CM | POA: Diagnosis not present

## 2019-11-16 DIAGNOSIS — D631 Anemia in chronic kidney disease: Secondary | ICD-10-CM | POA: Diagnosis not present

## 2019-11-18 DIAGNOSIS — N186 End stage renal disease: Secondary | ICD-10-CM | POA: Diagnosis not present

## 2019-11-18 DIAGNOSIS — D631 Anemia in chronic kidney disease: Secondary | ICD-10-CM | POA: Diagnosis not present

## 2019-11-18 DIAGNOSIS — N2581 Secondary hyperparathyroidism of renal origin: Secondary | ICD-10-CM | POA: Diagnosis not present

## 2019-11-18 DIAGNOSIS — Z992 Dependence on renal dialysis: Secondary | ICD-10-CM | POA: Diagnosis not present

## 2019-11-22 DIAGNOSIS — Z992 Dependence on renal dialysis: Secondary | ICD-10-CM | POA: Diagnosis not present

## 2019-11-22 DIAGNOSIS — D631 Anemia in chronic kidney disease: Secondary | ICD-10-CM | POA: Diagnosis not present

## 2019-11-22 DIAGNOSIS — N186 End stage renal disease: Secondary | ICD-10-CM | POA: Diagnosis not present

## 2019-11-22 DIAGNOSIS — N2581 Secondary hyperparathyroidism of renal origin: Secondary | ICD-10-CM | POA: Diagnosis not present

## 2019-11-23 DIAGNOSIS — M12272 Villonodular synovitis (pigmented), left ankle and foot: Secondary | ICD-10-CM | POA: Diagnosis not present

## 2019-11-24 DIAGNOSIS — Z992 Dependence on renal dialysis: Secondary | ICD-10-CM | POA: Diagnosis not present

## 2019-11-24 DIAGNOSIS — D631 Anemia in chronic kidney disease: Secondary | ICD-10-CM | POA: Diagnosis not present

## 2019-11-24 DIAGNOSIS — N186 End stage renal disease: Secondary | ICD-10-CM | POA: Diagnosis not present

## 2019-11-24 DIAGNOSIS — N2581 Secondary hyperparathyroidism of renal origin: Secondary | ICD-10-CM | POA: Diagnosis not present

## 2019-11-26 DIAGNOSIS — D631 Anemia in chronic kidney disease: Secondary | ICD-10-CM | POA: Diagnosis not present

## 2019-11-26 DIAGNOSIS — Z992 Dependence on renal dialysis: Secondary | ICD-10-CM | POA: Diagnosis not present

## 2019-11-26 DIAGNOSIS — N186 End stage renal disease: Secondary | ICD-10-CM | POA: Diagnosis not present

## 2019-11-26 DIAGNOSIS — N2581 Secondary hyperparathyroidism of renal origin: Secondary | ICD-10-CM | POA: Diagnosis not present

## 2019-11-28 DIAGNOSIS — N2581 Secondary hyperparathyroidism of renal origin: Secondary | ICD-10-CM | POA: Diagnosis not present

## 2019-11-28 DIAGNOSIS — Z992 Dependence on renal dialysis: Secondary | ICD-10-CM | POA: Diagnosis not present

## 2019-11-28 DIAGNOSIS — N033 Chronic nephritic syndrome with diffuse mesangial proliferative glomerulonephritis: Secondary | ICD-10-CM | POA: Diagnosis not present

## 2019-11-28 DIAGNOSIS — D631 Anemia in chronic kidney disease: Secondary | ICD-10-CM | POA: Diagnosis not present

## 2019-11-28 DIAGNOSIS — N186 End stage renal disease: Secondary | ICD-10-CM | POA: Diagnosis not present

## 2019-11-30 DIAGNOSIS — Z992 Dependence on renal dialysis: Secondary | ICD-10-CM | POA: Diagnosis not present

## 2019-11-30 DIAGNOSIS — D631 Anemia in chronic kidney disease: Secondary | ICD-10-CM | POA: Diagnosis not present

## 2019-11-30 DIAGNOSIS — N186 End stage renal disease: Secondary | ICD-10-CM | POA: Diagnosis not present

## 2019-11-30 DIAGNOSIS — N2581 Secondary hyperparathyroidism of renal origin: Secondary | ICD-10-CM | POA: Diagnosis not present

## 2019-12-02 DIAGNOSIS — Z992 Dependence on renal dialysis: Secondary | ICD-10-CM | POA: Diagnosis not present

## 2019-12-02 DIAGNOSIS — N186 End stage renal disease: Secondary | ICD-10-CM | POA: Diagnosis not present

## 2019-12-02 DIAGNOSIS — D631 Anemia in chronic kidney disease: Secondary | ICD-10-CM | POA: Diagnosis not present

## 2019-12-02 DIAGNOSIS — N2581 Secondary hyperparathyroidism of renal origin: Secondary | ICD-10-CM | POA: Diagnosis not present

## 2019-12-05 DIAGNOSIS — D631 Anemia in chronic kidney disease: Secondary | ICD-10-CM | POA: Diagnosis not present

## 2019-12-05 DIAGNOSIS — N2581 Secondary hyperparathyroidism of renal origin: Secondary | ICD-10-CM | POA: Diagnosis not present

## 2019-12-05 DIAGNOSIS — N186 End stage renal disease: Secondary | ICD-10-CM | POA: Diagnosis not present

## 2019-12-05 DIAGNOSIS — Z992 Dependence on renal dialysis: Secondary | ICD-10-CM | POA: Diagnosis not present

## 2019-12-07 DIAGNOSIS — N186 End stage renal disease: Secondary | ICD-10-CM | POA: Diagnosis not present

## 2019-12-07 DIAGNOSIS — D631 Anemia in chronic kidney disease: Secondary | ICD-10-CM | POA: Diagnosis not present

## 2019-12-07 DIAGNOSIS — Z992 Dependence on renal dialysis: Secondary | ICD-10-CM | POA: Diagnosis not present

## 2019-12-07 DIAGNOSIS — N2581 Secondary hyperparathyroidism of renal origin: Secondary | ICD-10-CM | POA: Diagnosis not present

## 2019-12-09 DIAGNOSIS — N2581 Secondary hyperparathyroidism of renal origin: Secondary | ICD-10-CM | POA: Diagnosis not present

## 2019-12-09 DIAGNOSIS — N186 End stage renal disease: Secondary | ICD-10-CM | POA: Diagnosis not present

## 2019-12-09 DIAGNOSIS — Z992 Dependence on renal dialysis: Secondary | ICD-10-CM | POA: Diagnosis not present

## 2019-12-09 DIAGNOSIS — D631 Anemia in chronic kidney disease: Secondary | ICD-10-CM | POA: Diagnosis not present

## 2019-12-12 DIAGNOSIS — N186 End stage renal disease: Secondary | ICD-10-CM | POA: Diagnosis not present

## 2019-12-12 DIAGNOSIS — D631 Anemia in chronic kidney disease: Secondary | ICD-10-CM | POA: Diagnosis not present

## 2019-12-12 DIAGNOSIS — N2581 Secondary hyperparathyroidism of renal origin: Secondary | ICD-10-CM | POA: Diagnosis not present

## 2019-12-12 DIAGNOSIS — Z992 Dependence on renal dialysis: Secondary | ICD-10-CM | POA: Diagnosis not present

## 2019-12-14 DIAGNOSIS — Z992 Dependence on renal dialysis: Secondary | ICD-10-CM | POA: Diagnosis not present

## 2019-12-14 DIAGNOSIS — N2581 Secondary hyperparathyroidism of renal origin: Secondary | ICD-10-CM | POA: Diagnosis not present

## 2019-12-14 DIAGNOSIS — N186 End stage renal disease: Secondary | ICD-10-CM | POA: Diagnosis not present

## 2019-12-14 DIAGNOSIS — D631 Anemia in chronic kidney disease: Secondary | ICD-10-CM | POA: Diagnosis not present

## 2019-12-16 DIAGNOSIS — D631 Anemia in chronic kidney disease: Secondary | ICD-10-CM | POA: Diagnosis not present

## 2019-12-16 DIAGNOSIS — N186 End stage renal disease: Secondary | ICD-10-CM | POA: Diagnosis not present

## 2019-12-16 DIAGNOSIS — N2581 Secondary hyperparathyroidism of renal origin: Secondary | ICD-10-CM | POA: Diagnosis not present

## 2019-12-16 DIAGNOSIS — Z992 Dependence on renal dialysis: Secondary | ICD-10-CM | POA: Diagnosis not present

## 2019-12-19 DIAGNOSIS — D631 Anemia in chronic kidney disease: Secondary | ICD-10-CM | POA: Diagnosis not present

## 2019-12-19 DIAGNOSIS — N186 End stage renal disease: Secondary | ICD-10-CM | POA: Diagnosis not present

## 2019-12-19 DIAGNOSIS — Z992 Dependence on renal dialysis: Secondary | ICD-10-CM | POA: Diagnosis not present

## 2019-12-19 DIAGNOSIS — N2581 Secondary hyperparathyroidism of renal origin: Secondary | ICD-10-CM | POA: Diagnosis not present

## 2019-12-21 DIAGNOSIS — Z992 Dependence on renal dialysis: Secondary | ICD-10-CM | POA: Diagnosis not present

## 2019-12-21 DIAGNOSIS — N186 End stage renal disease: Secondary | ICD-10-CM | POA: Diagnosis not present

## 2019-12-21 DIAGNOSIS — N2581 Secondary hyperparathyroidism of renal origin: Secondary | ICD-10-CM | POA: Diagnosis not present

## 2019-12-21 DIAGNOSIS — D631 Anemia in chronic kidney disease: Secondary | ICD-10-CM | POA: Diagnosis not present

## 2019-12-23 DIAGNOSIS — D631 Anemia in chronic kidney disease: Secondary | ICD-10-CM | POA: Diagnosis not present

## 2019-12-23 DIAGNOSIS — Z992 Dependence on renal dialysis: Secondary | ICD-10-CM | POA: Diagnosis not present

## 2019-12-23 DIAGNOSIS — N186 End stage renal disease: Secondary | ICD-10-CM | POA: Diagnosis not present

## 2019-12-23 DIAGNOSIS — N2581 Secondary hyperparathyroidism of renal origin: Secondary | ICD-10-CM | POA: Diagnosis not present

## 2019-12-24 IMAGING — DX DG CHEST 2V
2 series · 2 of 2 positions shown · non-contrast
Comparison: 11/03/2026 chest radiograph.

CLINICAL DATA: Dyspnea, end-stage renal disease on dialysis

EXAM:
CHEST - 2 VIEW

[chest pa]
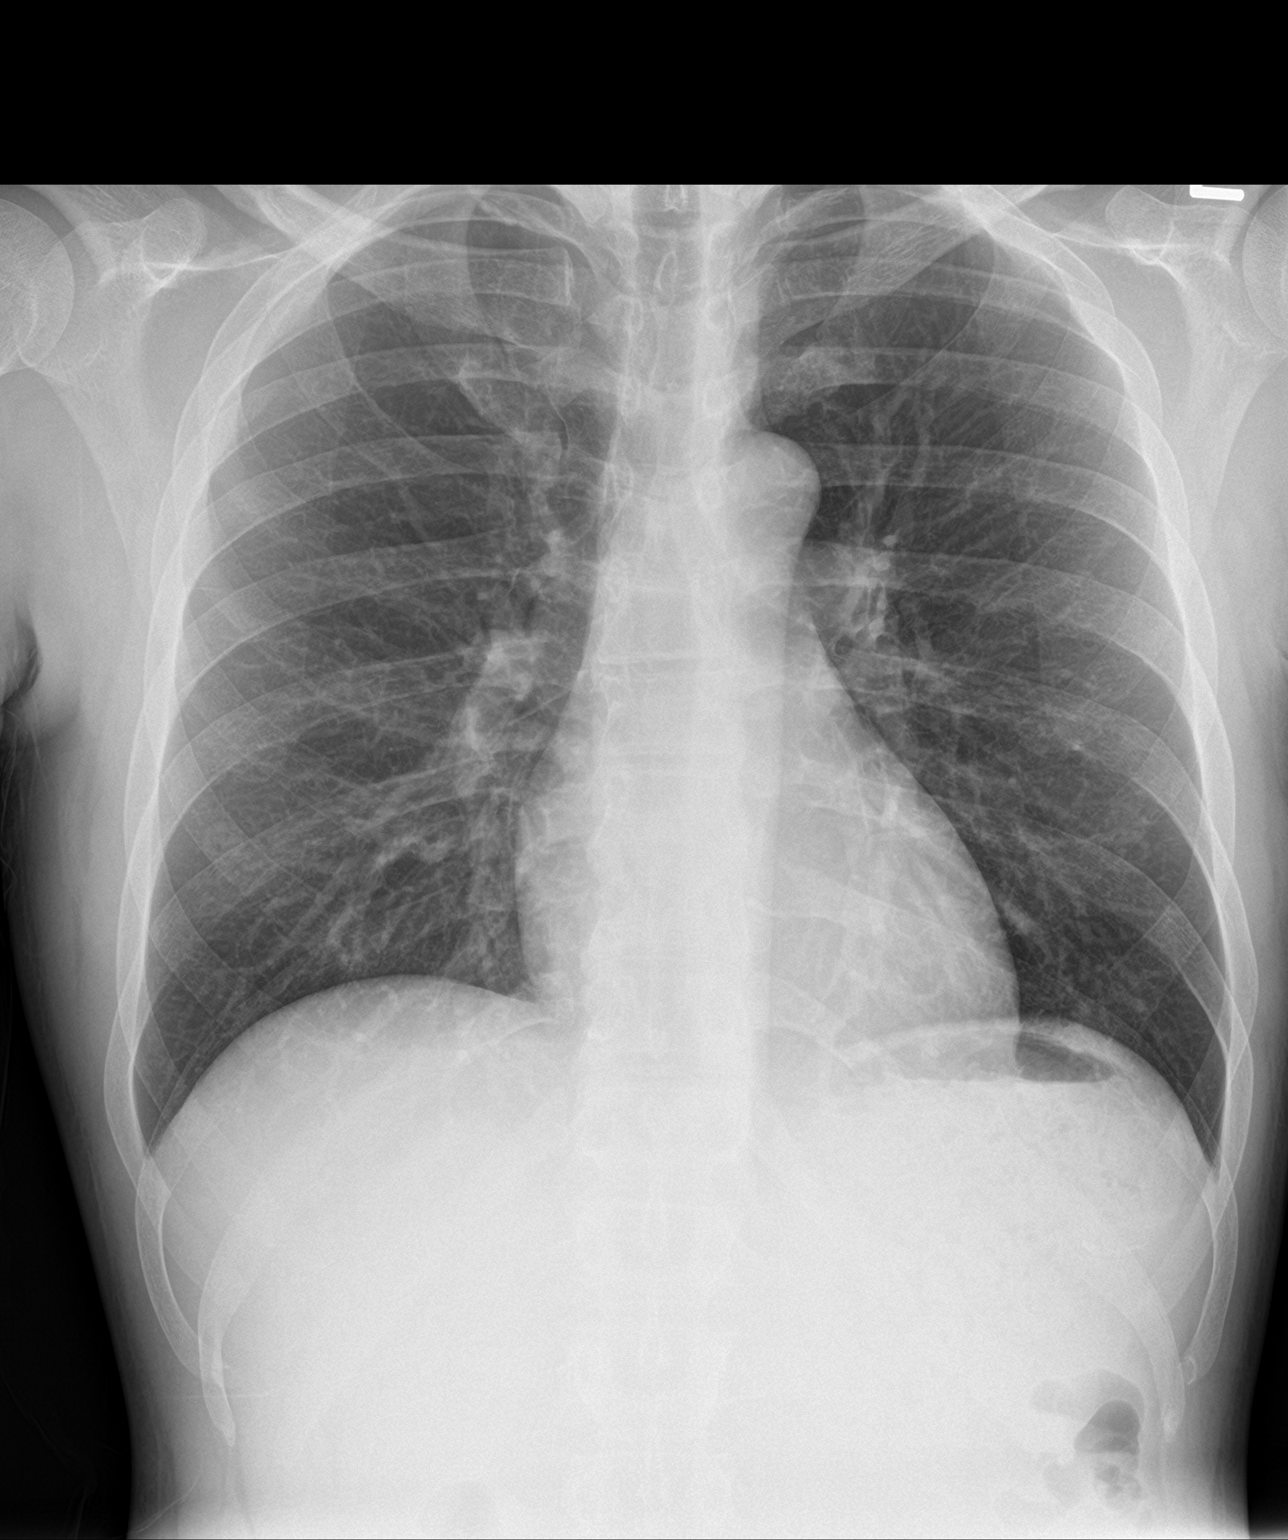

[chest lat]
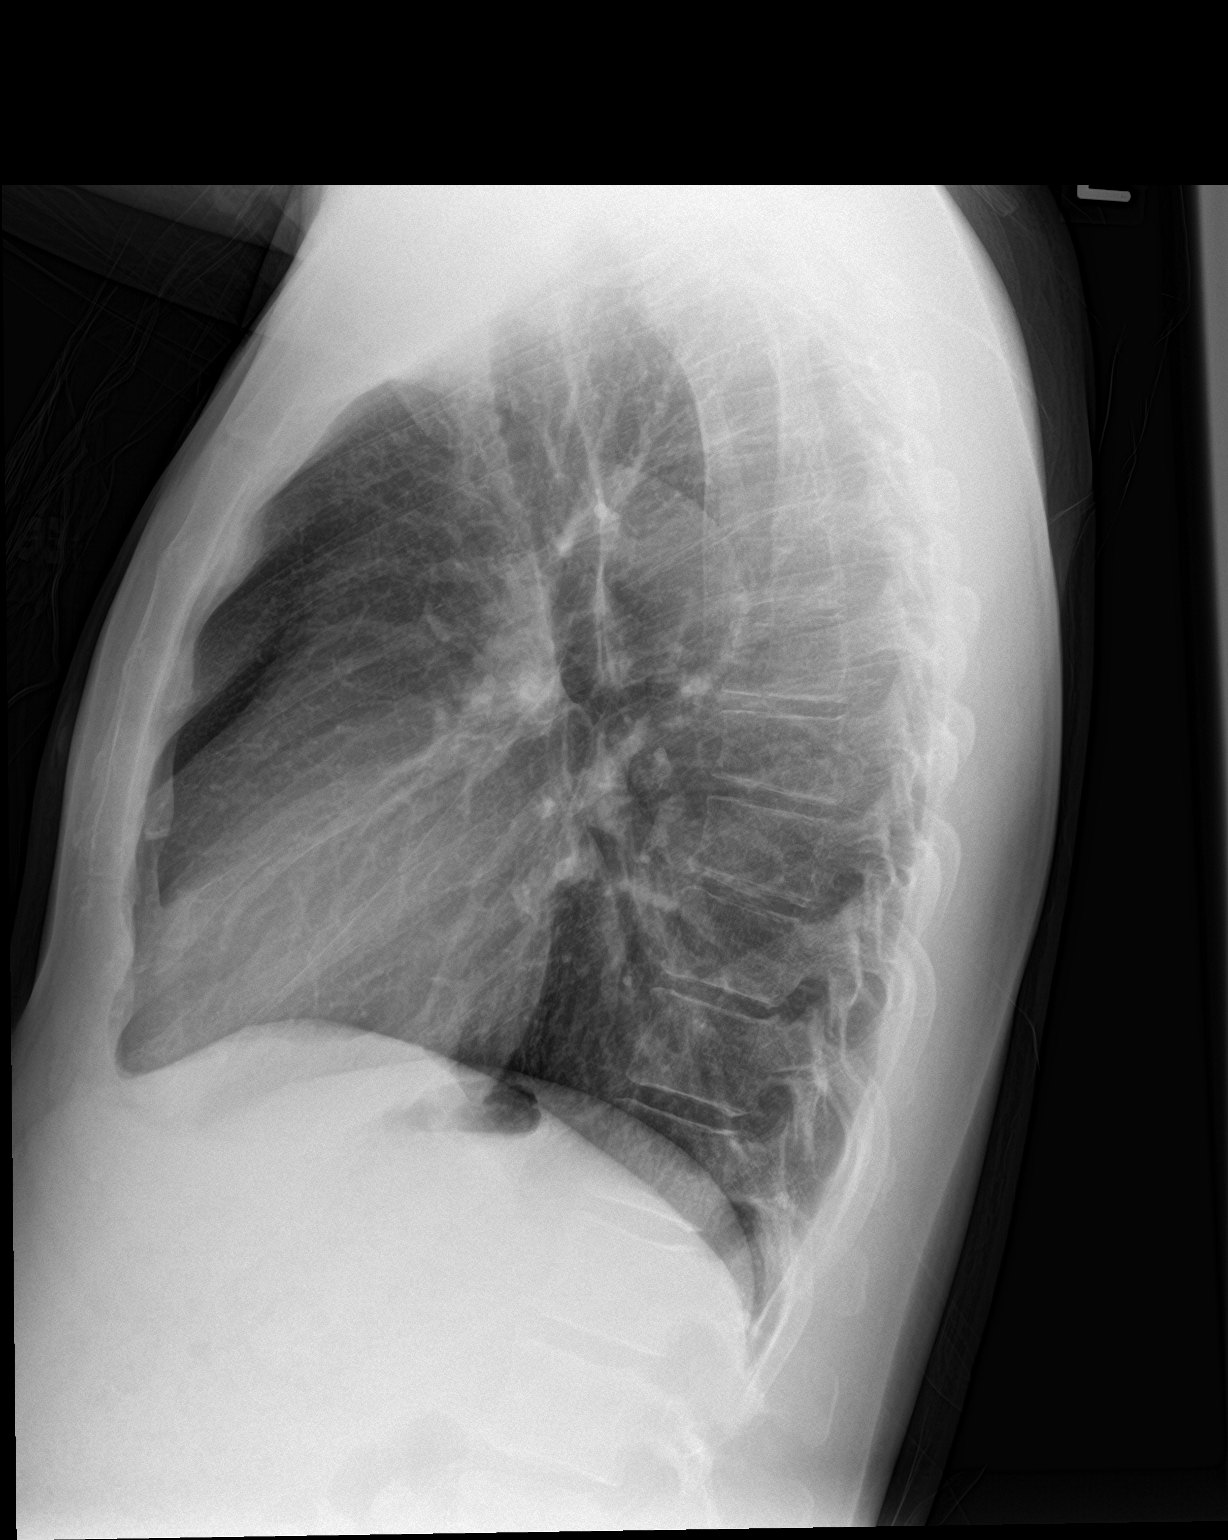

[2 of 2 positions shown; findings below may reference images not displayed]

FINDINGS: Stable cardiomediastinal silhouette with normal heart size. No
pneumothorax. No pleural effusion. Lungs appear clear, with no acute
consolidative airspace disease and no pulmonary edema.
IMPRESSION: No active cardiopulmonary disease.

## 2019-12-25 IMAGING — DX DG CHEST 2V
2 series · 2 of 2 positions shown · non-contrast
Comparison: 11/06/2018

CLINICAL DATA: Shortness of Breath

EXAM:
CHEST - 2 VIEW

[chest pa]
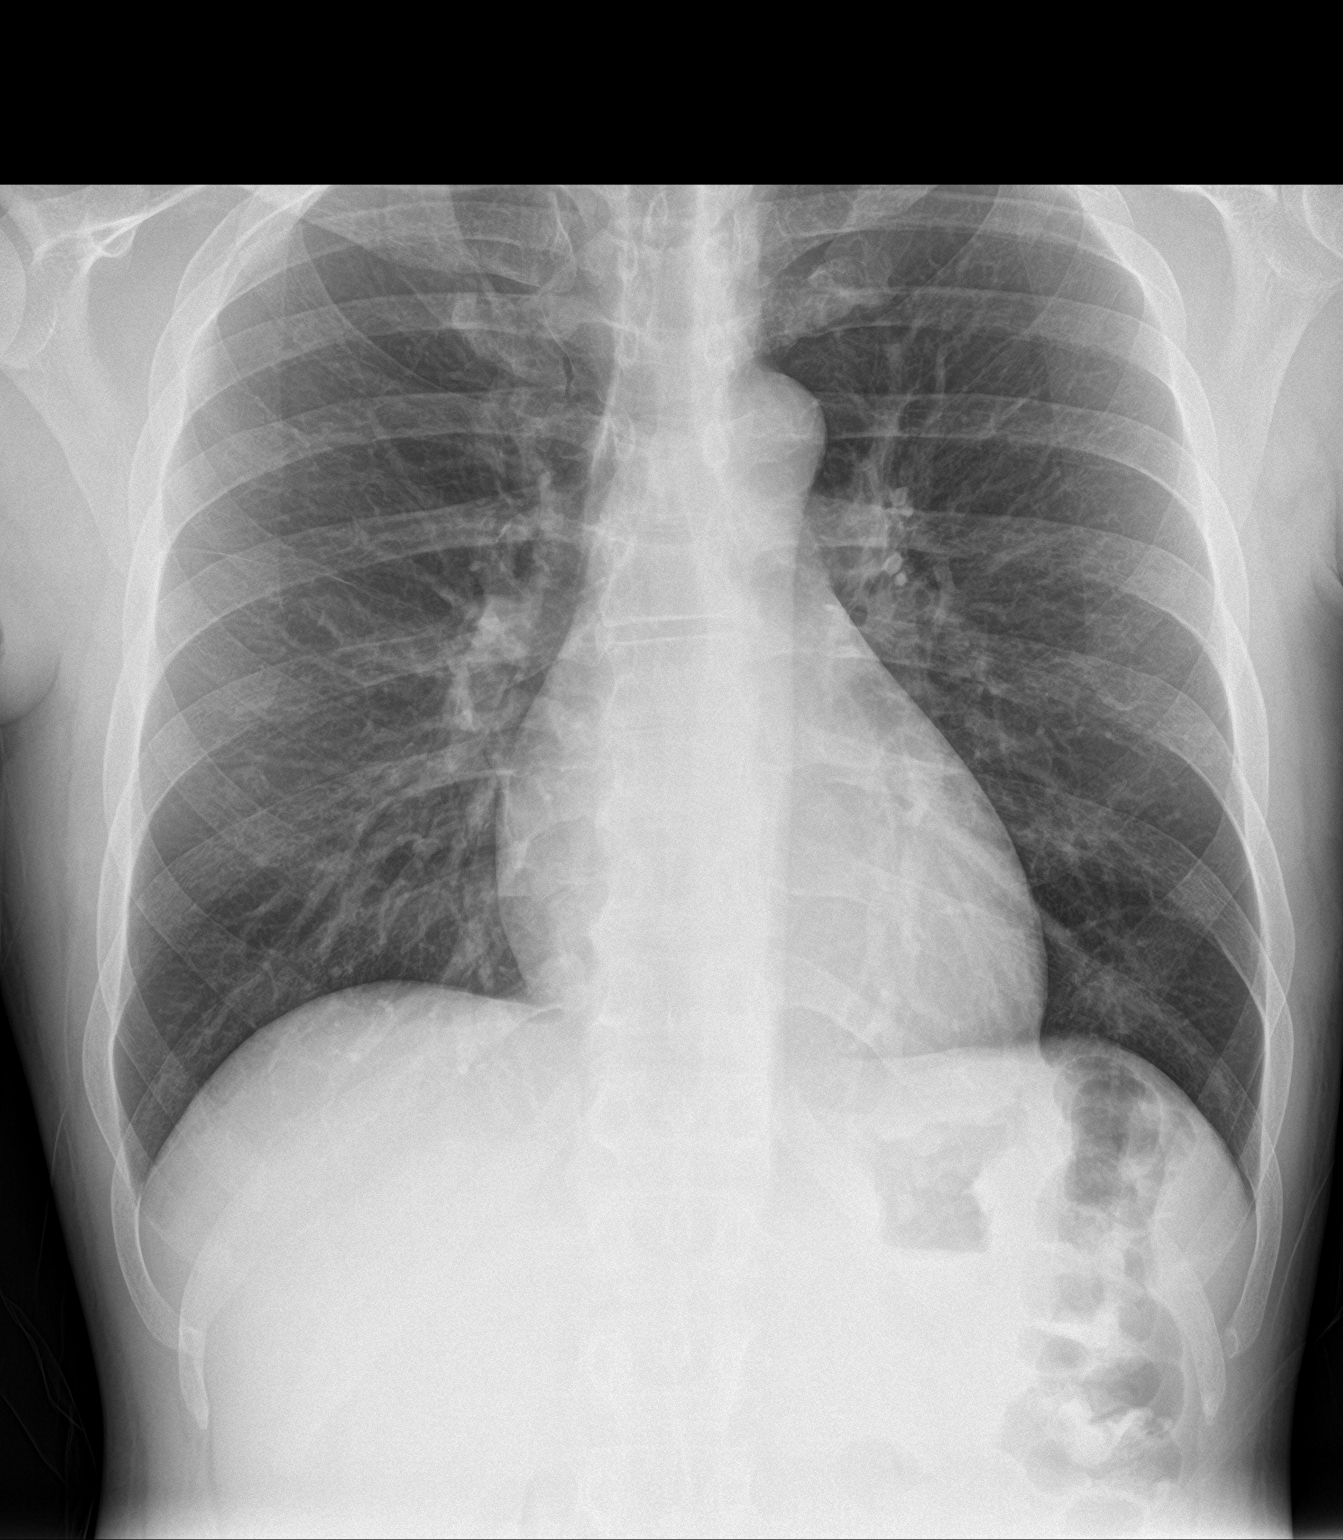

[chest lat]
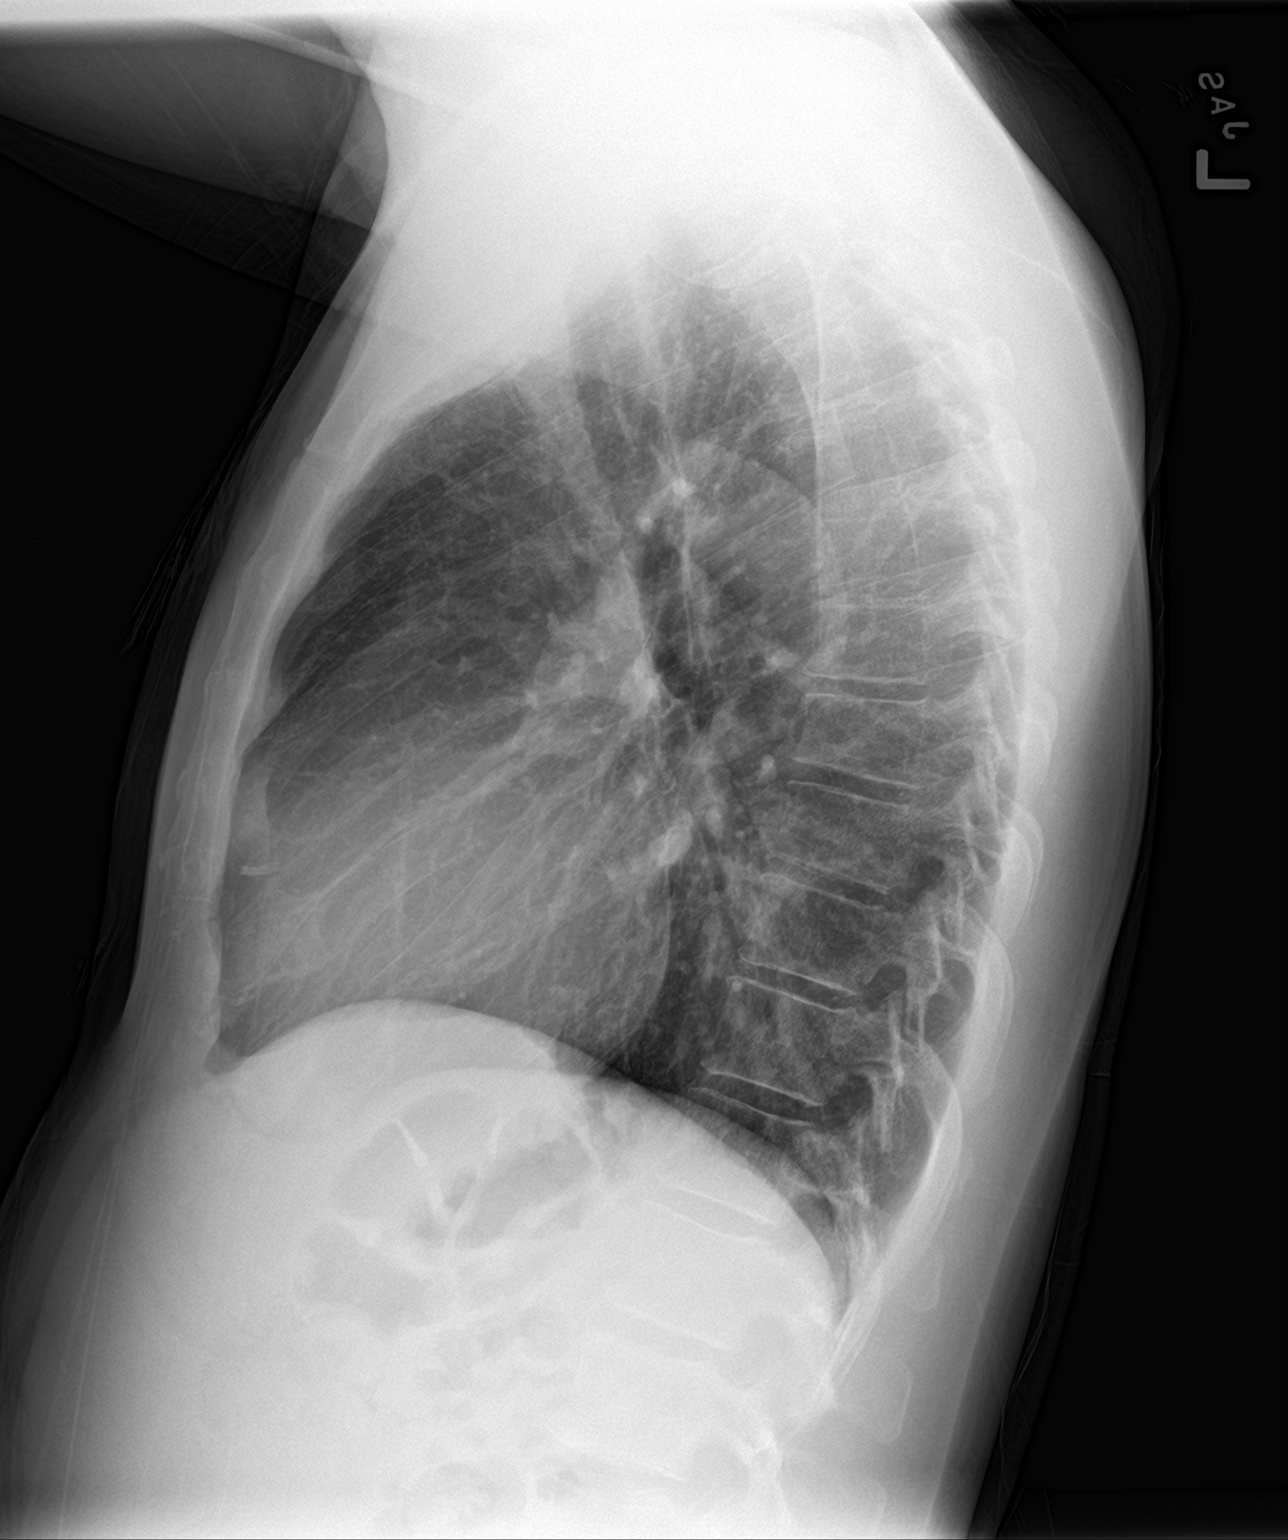

[2 of 2 positions shown; findings below may reference images not displayed]

FINDINGS: The heart size and mediastinal contours are within normal limits.
Both lungs are clear. The visualized skeletal structures are
unremarkable.
IMPRESSION: No active cardiopulmonary disease.

## 2019-12-26 DIAGNOSIS — Z992 Dependence on renal dialysis: Secondary | ICD-10-CM | POA: Diagnosis not present

## 2019-12-26 DIAGNOSIS — N2581 Secondary hyperparathyroidism of renal origin: Secondary | ICD-10-CM | POA: Diagnosis not present

## 2019-12-26 DIAGNOSIS — N186 End stage renal disease: Secondary | ICD-10-CM | POA: Diagnosis not present

## 2019-12-26 DIAGNOSIS — D631 Anemia in chronic kidney disease: Secondary | ICD-10-CM | POA: Diagnosis not present

## 2019-12-28 DIAGNOSIS — N186 End stage renal disease: Secondary | ICD-10-CM | POA: Diagnosis not present

## 2019-12-28 DIAGNOSIS — Z992 Dependence on renal dialysis: Secondary | ICD-10-CM | POA: Diagnosis not present

## 2019-12-28 DIAGNOSIS — D631 Anemia in chronic kidney disease: Secondary | ICD-10-CM | POA: Diagnosis not present

## 2019-12-28 DIAGNOSIS — N2581 Secondary hyperparathyroidism of renal origin: Secondary | ICD-10-CM | POA: Diagnosis not present

## 2019-12-29 DIAGNOSIS — N033 Chronic nephritic syndrome with diffuse mesangial proliferative glomerulonephritis: Secondary | ICD-10-CM | POA: Diagnosis not present

## 2019-12-29 DIAGNOSIS — N186 End stage renal disease: Secondary | ICD-10-CM | POA: Diagnosis not present

## 2019-12-29 DIAGNOSIS — Z992 Dependence on renal dialysis: Secondary | ICD-10-CM | POA: Diagnosis not present

## 2019-12-30 DIAGNOSIS — D631 Anemia in chronic kidney disease: Secondary | ICD-10-CM | POA: Diagnosis not present

## 2019-12-30 DIAGNOSIS — Z992 Dependence on renal dialysis: Secondary | ICD-10-CM | POA: Diagnosis not present

## 2019-12-30 DIAGNOSIS — N2581 Secondary hyperparathyroidism of renal origin: Secondary | ICD-10-CM | POA: Diagnosis not present

## 2019-12-30 DIAGNOSIS — N186 End stage renal disease: Secondary | ICD-10-CM | POA: Diagnosis not present

## 2020-01-02 DIAGNOSIS — Z992 Dependence on renal dialysis: Secondary | ICD-10-CM | POA: Diagnosis not present

## 2020-01-02 DIAGNOSIS — D631 Anemia in chronic kidney disease: Secondary | ICD-10-CM | POA: Diagnosis not present

## 2020-01-02 DIAGNOSIS — N186 End stage renal disease: Secondary | ICD-10-CM | POA: Diagnosis not present

## 2020-01-02 DIAGNOSIS — N2581 Secondary hyperparathyroidism of renal origin: Secondary | ICD-10-CM | POA: Diagnosis not present

## 2020-01-04 DIAGNOSIS — N2581 Secondary hyperparathyroidism of renal origin: Secondary | ICD-10-CM | POA: Diagnosis not present

## 2020-01-04 DIAGNOSIS — Z992 Dependence on renal dialysis: Secondary | ICD-10-CM | POA: Diagnosis not present

## 2020-01-04 DIAGNOSIS — D631 Anemia in chronic kidney disease: Secondary | ICD-10-CM | POA: Diagnosis not present

## 2020-01-04 DIAGNOSIS — N186 End stage renal disease: Secondary | ICD-10-CM | POA: Diagnosis not present

## 2020-01-06 DIAGNOSIS — Z992 Dependence on renal dialysis: Secondary | ICD-10-CM | POA: Diagnosis not present

## 2020-01-06 DIAGNOSIS — D631 Anemia in chronic kidney disease: Secondary | ICD-10-CM | POA: Diagnosis not present

## 2020-01-06 DIAGNOSIS — N2581 Secondary hyperparathyroidism of renal origin: Secondary | ICD-10-CM | POA: Diagnosis not present

## 2020-01-06 DIAGNOSIS — N186 End stage renal disease: Secondary | ICD-10-CM | POA: Diagnosis not present

## 2020-01-10 DIAGNOSIS — D631 Anemia in chronic kidney disease: Secondary | ICD-10-CM | POA: Diagnosis not present

## 2020-01-10 DIAGNOSIS — Z992 Dependence on renal dialysis: Secondary | ICD-10-CM | POA: Diagnosis not present

## 2020-01-10 DIAGNOSIS — N2581 Secondary hyperparathyroidism of renal origin: Secondary | ICD-10-CM | POA: Diagnosis not present

## 2020-01-10 DIAGNOSIS — N186 End stage renal disease: Secondary | ICD-10-CM | POA: Diagnosis not present

## 2020-01-11 DIAGNOSIS — D631 Anemia in chronic kidney disease: Secondary | ICD-10-CM | POA: Diagnosis not present

## 2020-01-11 DIAGNOSIS — N186 End stage renal disease: Secondary | ICD-10-CM | POA: Diagnosis not present

## 2020-01-11 DIAGNOSIS — N2581 Secondary hyperparathyroidism of renal origin: Secondary | ICD-10-CM | POA: Diagnosis not present

## 2020-01-11 DIAGNOSIS — Z992 Dependence on renal dialysis: Secondary | ICD-10-CM | POA: Diagnosis not present

## 2020-01-13 DIAGNOSIS — N186 End stage renal disease: Secondary | ICD-10-CM | POA: Diagnosis not present

## 2020-01-13 DIAGNOSIS — N2581 Secondary hyperparathyroidism of renal origin: Secondary | ICD-10-CM | POA: Diagnosis not present

## 2020-01-13 DIAGNOSIS — Z992 Dependence on renal dialysis: Secondary | ICD-10-CM | POA: Diagnosis not present

## 2020-01-13 DIAGNOSIS — D631 Anemia in chronic kidney disease: Secondary | ICD-10-CM | POA: Diagnosis not present

## 2020-01-16 DIAGNOSIS — N2581 Secondary hyperparathyroidism of renal origin: Secondary | ICD-10-CM | POA: Diagnosis not present

## 2020-01-16 DIAGNOSIS — D631 Anemia in chronic kidney disease: Secondary | ICD-10-CM | POA: Diagnosis not present

## 2020-01-16 DIAGNOSIS — Z992 Dependence on renal dialysis: Secondary | ICD-10-CM | POA: Diagnosis not present

## 2020-01-16 DIAGNOSIS — N186 End stage renal disease: Secondary | ICD-10-CM | POA: Diagnosis not present

## 2020-01-18 DIAGNOSIS — D631 Anemia in chronic kidney disease: Secondary | ICD-10-CM | POA: Diagnosis not present

## 2020-01-18 DIAGNOSIS — N186 End stage renal disease: Secondary | ICD-10-CM | POA: Diagnosis not present

## 2020-01-18 DIAGNOSIS — N2581 Secondary hyperparathyroidism of renal origin: Secondary | ICD-10-CM | POA: Diagnosis not present

## 2020-01-18 DIAGNOSIS — Z992 Dependence on renal dialysis: Secondary | ICD-10-CM | POA: Diagnosis not present

## 2020-01-20 DIAGNOSIS — N186 End stage renal disease: Secondary | ICD-10-CM | POA: Diagnosis not present

## 2020-01-20 DIAGNOSIS — Z992 Dependence on renal dialysis: Secondary | ICD-10-CM | POA: Diagnosis not present

## 2020-01-20 DIAGNOSIS — N2581 Secondary hyperparathyroidism of renal origin: Secondary | ICD-10-CM | POA: Diagnosis not present

## 2020-01-20 DIAGNOSIS — D631 Anemia in chronic kidney disease: Secondary | ICD-10-CM | POA: Diagnosis not present

## 2020-01-23 DIAGNOSIS — D631 Anemia in chronic kidney disease: Secondary | ICD-10-CM | POA: Diagnosis not present

## 2020-01-23 DIAGNOSIS — N186 End stage renal disease: Secondary | ICD-10-CM | POA: Diagnosis not present

## 2020-01-23 DIAGNOSIS — Z992 Dependence on renal dialysis: Secondary | ICD-10-CM | POA: Diagnosis not present

## 2020-01-23 DIAGNOSIS — N2581 Secondary hyperparathyroidism of renal origin: Secondary | ICD-10-CM | POA: Diagnosis not present

## 2020-01-25 DIAGNOSIS — N186 End stage renal disease: Secondary | ICD-10-CM | POA: Diagnosis not present

## 2020-01-25 DIAGNOSIS — Z992 Dependence on renal dialysis: Secondary | ICD-10-CM | POA: Diagnosis not present

## 2020-01-25 DIAGNOSIS — N2581 Secondary hyperparathyroidism of renal origin: Secondary | ICD-10-CM | POA: Diagnosis not present

## 2020-01-25 DIAGNOSIS — D631 Anemia in chronic kidney disease: Secondary | ICD-10-CM | POA: Diagnosis not present

## 2020-01-27 DIAGNOSIS — N186 End stage renal disease: Secondary | ICD-10-CM | POA: Diagnosis not present

## 2020-01-27 DIAGNOSIS — Z992 Dependence on renal dialysis: Secondary | ICD-10-CM | POA: Diagnosis not present

## 2020-01-27 DIAGNOSIS — D631 Anemia in chronic kidney disease: Secondary | ICD-10-CM | POA: Diagnosis not present

## 2020-01-27 DIAGNOSIS — N2581 Secondary hyperparathyroidism of renal origin: Secondary | ICD-10-CM | POA: Diagnosis not present

## 2020-01-28 DIAGNOSIS — Z992 Dependence on renal dialysis: Secondary | ICD-10-CM | POA: Diagnosis not present

## 2020-01-28 DIAGNOSIS — N033 Chronic nephritic syndrome with diffuse mesangial proliferative glomerulonephritis: Secondary | ICD-10-CM | POA: Diagnosis not present

## 2020-01-28 DIAGNOSIS — N186 End stage renal disease: Secondary | ICD-10-CM | POA: Diagnosis not present

## 2020-01-30 DIAGNOSIS — N2581 Secondary hyperparathyroidism of renal origin: Secondary | ICD-10-CM | POA: Diagnosis not present

## 2020-01-30 DIAGNOSIS — N186 End stage renal disease: Secondary | ICD-10-CM | POA: Diagnosis not present

## 2020-01-30 DIAGNOSIS — D631 Anemia in chronic kidney disease: Secondary | ICD-10-CM | POA: Diagnosis not present

## 2020-01-30 DIAGNOSIS — Z992 Dependence on renal dialysis: Secondary | ICD-10-CM | POA: Diagnosis not present

## 2020-01-30 DIAGNOSIS — D509 Iron deficiency anemia, unspecified: Secondary | ICD-10-CM | POA: Diagnosis not present

## 2020-02-01 DIAGNOSIS — Z992 Dependence on renal dialysis: Secondary | ICD-10-CM | POA: Diagnosis not present

## 2020-02-01 DIAGNOSIS — N2581 Secondary hyperparathyroidism of renal origin: Secondary | ICD-10-CM | POA: Diagnosis not present

## 2020-02-01 DIAGNOSIS — D631 Anemia in chronic kidney disease: Secondary | ICD-10-CM | POA: Diagnosis not present

## 2020-02-01 DIAGNOSIS — D509 Iron deficiency anemia, unspecified: Secondary | ICD-10-CM | POA: Diagnosis not present

## 2020-02-01 DIAGNOSIS — N186 End stage renal disease: Secondary | ICD-10-CM | POA: Diagnosis not present

## 2020-02-03 DIAGNOSIS — D631 Anemia in chronic kidney disease: Secondary | ICD-10-CM | POA: Diagnosis not present

## 2020-02-03 DIAGNOSIS — N186 End stage renal disease: Secondary | ICD-10-CM | POA: Diagnosis not present

## 2020-02-03 DIAGNOSIS — D509 Iron deficiency anemia, unspecified: Secondary | ICD-10-CM | POA: Diagnosis not present

## 2020-02-03 DIAGNOSIS — Z992 Dependence on renal dialysis: Secondary | ICD-10-CM | POA: Diagnosis not present

## 2020-02-03 DIAGNOSIS — N2581 Secondary hyperparathyroidism of renal origin: Secondary | ICD-10-CM | POA: Diagnosis not present

## 2020-02-06 DIAGNOSIS — D509 Iron deficiency anemia, unspecified: Secondary | ICD-10-CM | POA: Diagnosis not present

## 2020-02-06 DIAGNOSIS — Z992 Dependence on renal dialysis: Secondary | ICD-10-CM | POA: Diagnosis not present

## 2020-02-06 DIAGNOSIS — N186 End stage renal disease: Secondary | ICD-10-CM | POA: Diagnosis not present

## 2020-02-06 DIAGNOSIS — D631 Anemia in chronic kidney disease: Secondary | ICD-10-CM | POA: Diagnosis not present

## 2020-02-06 DIAGNOSIS — N2581 Secondary hyperparathyroidism of renal origin: Secondary | ICD-10-CM | POA: Diagnosis not present

## 2020-02-08 DIAGNOSIS — D631 Anemia in chronic kidney disease: Secondary | ICD-10-CM | POA: Diagnosis not present

## 2020-02-08 DIAGNOSIS — N2581 Secondary hyperparathyroidism of renal origin: Secondary | ICD-10-CM | POA: Diagnosis not present

## 2020-02-08 DIAGNOSIS — D509 Iron deficiency anemia, unspecified: Secondary | ICD-10-CM | POA: Diagnosis not present

## 2020-02-08 DIAGNOSIS — Z992 Dependence on renal dialysis: Secondary | ICD-10-CM | POA: Diagnosis not present

## 2020-02-08 DIAGNOSIS — N186 End stage renal disease: Secondary | ICD-10-CM | POA: Diagnosis not present

## 2020-02-10 DIAGNOSIS — N2581 Secondary hyperparathyroidism of renal origin: Secondary | ICD-10-CM | POA: Diagnosis not present

## 2020-02-10 DIAGNOSIS — D631 Anemia in chronic kidney disease: Secondary | ICD-10-CM | POA: Diagnosis not present

## 2020-02-10 DIAGNOSIS — Z992 Dependence on renal dialysis: Secondary | ICD-10-CM | POA: Diagnosis not present

## 2020-02-10 DIAGNOSIS — N186 End stage renal disease: Secondary | ICD-10-CM | POA: Diagnosis not present

## 2020-02-10 DIAGNOSIS — D509 Iron deficiency anemia, unspecified: Secondary | ICD-10-CM | POA: Diagnosis not present

## 2020-02-13 DIAGNOSIS — N186 End stage renal disease: Secondary | ICD-10-CM | POA: Diagnosis not present

## 2020-02-13 DIAGNOSIS — Z992 Dependence on renal dialysis: Secondary | ICD-10-CM | POA: Diagnosis not present

## 2020-02-13 DIAGNOSIS — N2581 Secondary hyperparathyroidism of renal origin: Secondary | ICD-10-CM | POA: Diagnosis not present

## 2020-02-13 DIAGNOSIS — D631 Anemia in chronic kidney disease: Secondary | ICD-10-CM | POA: Diagnosis not present

## 2020-02-13 DIAGNOSIS — D509 Iron deficiency anemia, unspecified: Secondary | ICD-10-CM | POA: Diagnosis not present

## 2020-02-15 DIAGNOSIS — D509 Iron deficiency anemia, unspecified: Secondary | ICD-10-CM | POA: Diagnosis not present

## 2020-02-15 DIAGNOSIS — N186 End stage renal disease: Secondary | ICD-10-CM | POA: Diagnosis not present

## 2020-02-15 DIAGNOSIS — Z992 Dependence on renal dialysis: Secondary | ICD-10-CM | POA: Diagnosis not present

## 2020-02-15 DIAGNOSIS — D631 Anemia in chronic kidney disease: Secondary | ICD-10-CM | POA: Diagnosis not present

## 2020-02-15 DIAGNOSIS — N2581 Secondary hyperparathyroidism of renal origin: Secondary | ICD-10-CM | POA: Diagnosis not present

## 2020-02-17 DIAGNOSIS — N2581 Secondary hyperparathyroidism of renal origin: Secondary | ICD-10-CM | POA: Diagnosis not present

## 2020-02-17 DIAGNOSIS — D509 Iron deficiency anemia, unspecified: Secondary | ICD-10-CM | POA: Diagnosis not present

## 2020-02-17 DIAGNOSIS — Z992 Dependence on renal dialysis: Secondary | ICD-10-CM | POA: Diagnosis not present

## 2020-02-17 DIAGNOSIS — N186 End stage renal disease: Secondary | ICD-10-CM | POA: Diagnosis not present

## 2020-02-17 DIAGNOSIS — D631 Anemia in chronic kidney disease: Secondary | ICD-10-CM | POA: Diagnosis not present

## 2020-02-20 DIAGNOSIS — N2581 Secondary hyperparathyroidism of renal origin: Secondary | ICD-10-CM | POA: Diagnosis not present

## 2020-02-20 DIAGNOSIS — Z992 Dependence on renal dialysis: Secondary | ICD-10-CM | POA: Diagnosis not present

## 2020-02-20 DIAGNOSIS — D631 Anemia in chronic kidney disease: Secondary | ICD-10-CM | POA: Diagnosis not present

## 2020-02-20 DIAGNOSIS — N186 End stage renal disease: Secondary | ICD-10-CM | POA: Diagnosis not present

## 2020-02-20 DIAGNOSIS — D509 Iron deficiency anemia, unspecified: Secondary | ICD-10-CM | POA: Diagnosis not present

## 2020-02-22 DIAGNOSIS — D631 Anemia in chronic kidney disease: Secondary | ICD-10-CM | POA: Diagnosis not present

## 2020-02-22 DIAGNOSIS — N186 End stage renal disease: Secondary | ICD-10-CM | POA: Diagnosis not present

## 2020-02-22 DIAGNOSIS — N2581 Secondary hyperparathyroidism of renal origin: Secondary | ICD-10-CM | POA: Diagnosis not present

## 2020-02-22 DIAGNOSIS — D509 Iron deficiency anemia, unspecified: Secondary | ICD-10-CM | POA: Diagnosis not present

## 2020-02-22 DIAGNOSIS — Z992 Dependence on renal dialysis: Secondary | ICD-10-CM | POA: Diagnosis not present

## 2020-02-24 DIAGNOSIS — D509 Iron deficiency anemia, unspecified: Secondary | ICD-10-CM | POA: Diagnosis not present

## 2020-02-24 DIAGNOSIS — N186 End stage renal disease: Secondary | ICD-10-CM | POA: Diagnosis not present

## 2020-02-24 DIAGNOSIS — Z992 Dependence on renal dialysis: Secondary | ICD-10-CM | POA: Diagnosis not present

## 2020-02-24 DIAGNOSIS — D631 Anemia in chronic kidney disease: Secondary | ICD-10-CM | POA: Diagnosis not present

## 2020-02-24 DIAGNOSIS — N2581 Secondary hyperparathyroidism of renal origin: Secondary | ICD-10-CM | POA: Diagnosis not present

## 2020-02-27 DIAGNOSIS — D631 Anemia in chronic kidney disease: Secondary | ICD-10-CM | POA: Diagnosis not present

## 2020-02-27 DIAGNOSIS — N2581 Secondary hyperparathyroidism of renal origin: Secondary | ICD-10-CM | POA: Diagnosis not present

## 2020-02-27 DIAGNOSIS — N186 End stage renal disease: Secondary | ICD-10-CM | POA: Diagnosis not present

## 2020-02-27 DIAGNOSIS — Z992 Dependence on renal dialysis: Secondary | ICD-10-CM | POA: Diagnosis not present

## 2020-02-27 DIAGNOSIS — D509 Iron deficiency anemia, unspecified: Secondary | ICD-10-CM | POA: Diagnosis not present

## 2020-03-29 DIAGNOSIS — N033 Chronic nephritic syndrome with diffuse mesangial proliferative glomerulonephritis: Secondary | ICD-10-CM | POA: Diagnosis not present

## 2020-03-29 DIAGNOSIS — N186 End stage renal disease: Secondary | ICD-10-CM | POA: Diagnosis not present

## 2020-03-29 DIAGNOSIS — Z992 Dependence on renal dialysis: Secondary | ICD-10-CM | POA: Diagnosis not present

## 2020-03-30 DIAGNOSIS — N186 End stage renal disease: Secondary | ICD-10-CM | POA: Diagnosis not present

## 2020-03-30 DIAGNOSIS — N2581 Secondary hyperparathyroidism of renal origin: Secondary | ICD-10-CM | POA: Diagnosis not present

## 2020-03-30 DIAGNOSIS — D509 Iron deficiency anemia, unspecified: Secondary | ICD-10-CM | POA: Diagnosis not present

## 2020-03-30 DIAGNOSIS — Z992 Dependence on renal dialysis: Secondary | ICD-10-CM | POA: Diagnosis not present

## 2020-04-02 DIAGNOSIS — D509 Iron deficiency anemia, unspecified: Secondary | ICD-10-CM | POA: Diagnosis not present

## 2020-04-02 DIAGNOSIS — N2581 Secondary hyperparathyroidism of renal origin: Secondary | ICD-10-CM | POA: Diagnosis not present

## 2020-04-02 DIAGNOSIS — N186 End stage renal disease: Secondary | ICD-10-CM | POA: Diagnosis not present

## 2020-04-02 DIAGNOSIS — Z992 Dependence on renal dialysis: Secondary | ICD-10-CM | POA: Diagnosis not present

## 2020-04-03 DIAGNOSIS — N186 End stage renal disease: Secondary | ICD-10-CM | POA: Diagnosis not present

## 2020-04-03 DIAGNOSIS — N2581 Secondary hyperparathyroidism of renal origin: Secondary | ICD-10-CM | POA: Diagnosis not present

## 2020-04-03 DIAGNOSIS — Z992 Dependence on renal dialysis: Secondary | ICD-10-CM | POA: Diagnosis not present

## 2020-04-03 DIAGNOSIS — D509 Iron deficiency anemia, unspecified: Secondary | ICD-10-CM | POA: Diagnosis not present

## 2020-04-06 DIAGNOSIS — Z992 Dependence on renal dialysis: Secondary | ICD-10-CM | POA: Diagnosis not present

## 2020-04-06 DIAGNOSIS — N186 End stage renal disease: Secondary | ICD-10-CM | POA: Diagnosis not present

## 2020-04-06 DIAGNOSIS — D509 Iron deficiency anemia, unspecified: Secondary | ICD-10-CM | POA: Diagnosis not present

## 2020-04-06 DIAGNOSIS — N2581 Secondary hyperparathyroidism of renal origin: Secondary | ICD-10-CM | POA: Diagnosis not present

## 2020-04-09 DIAGNOSIS — D509 Iron deficiency anemia, unspecified: Secondary | ICD-10-CM | POA: Diagnosis not present

## 2020-04-09 DIAGNOSIS — N2581 Secondary hyperparathyroidism of renal origin: Secondary | ICD-10-CM | POA: Diagnosis not present

## 2020-04-09 DIAGNOSIS — N186 End stage renal disease: Secondary | ICD-10-CM | POA: Diagnosis not present

## 2020-04-09 DIAGNOSIS — Z992 Dependence on renal dialysis: Secondary | ICD-10-CM | POA: Diagnosis not present

## 2020-04-11 DIAGNOSIS — N2581 Secondary hyperparathyroidism of renal origin: Secondary | ICD-10-CM | POA: Diagnosis not present

## 2020-04-11 DIAGNOSIS — Z992 Dependence on renal dialysis: Secondary | ICD-10-CM | POA: Diagnosis not present

## 2020-04-11 DIAGNOSIS — N186 End stage renal disease: Secondary | ICD-10-CM | POA: Diagnosis not present

## 2020-04-11 DIAGNOSIS — D509 Iron deficiency anemia, unspecified: Secondary | ICD-10-CM | POA: Diagnosis not present

## 2020-04-13 DIAGNOSIS — N186 End stage renal disease: Secondary | ICD-10-CM | POA: Diagnosis not present

## 2020-04-13 DIAGNOSIS — D509 Iron deficiency anemia, unspecified: Secondary | ICD-10-CM | POA: Diagnosis not present

## 2020-04-13 DIAGNOSIS — Z992 Dependence on renal dialysis: Secondary | ICD-10-CM | POA: Diagnosis not present

## 2020-04-13 DIAGNOSIS — N2581 Secondary hyperparathyroidism of renal origin: Secondary | ICD-10-CM | POA: Diagnosis not present

## 2020-04-18 DIAGNOSIS — N2581 Secondary hyperparathyroidism of renal origin: Secondary | ICD-10-CM | POA: Diagnosis not present

## 2020-04-18 DIAGNOSIS — D509 Iron deficiency anemia, unspecified: Secondary | ICD-10-CM | POA: Diagnosis not present

## 2020-04-18 DIAGNOSIS — N186 End stage renal disease: Secondary | ICD-10-CM | POA: Diagnosis not present

## 2020-04-18 DIAGNOSIS — Z992 Dependence on renal dialysis: Secondary | ICD-10-CM | POA: Diagnosis not present

## 2020-04-20 DIAGNOSIS — D509 Iron deficiency anemia, unspecified: Secondary | ICD-10-CM | POA: Diagnosis not present

## 2020-04-20 DIAGNOSIS — N186 End stage renal disease: Secondary | ICD-10-CM | POA: Diagnosis not present

## 2020-04-20 DIAGNOSIS — Z992 Dependence on renal dialysis: Secondary | ICD-10-CM | POA: Diagnosis not present

## 2020-04-20 DIAGNOSIS — N2581 Secondary hyperparathyroidism of renal origin: Secondary | ICD-10-CM | POA: Diagnosis not present

## 2020-04-23 DIAGNOSIS — Z992 Dependence on renal dialysis: Secondary | ICD-10-CM | POA: Diagnosis not present

## 2020-04-23 DIAGNOSIS — N2581 Secondary hyperparathyroidism of renal origin: Secondary | ICD-10-CM | POA: Diagnosis not present

## 2020-04-23 DIAGNOSIS — D509 Iron deficiency anemia, unspecified: Secondary | ICD-10-CM | POA: Diagnosis not present

## 2020-04-23 DIAGNOSIS — N186 End stage renal disease: Secondary | ICD-10-CM | POA: Diagnosis not present

## 2020-04-25 DIAGNOSIS — N186 End stage renal disease: Secondary | ICD-10-CM | POA: Diagnosis not present

## 2020-04-25 DIAGNOSIS — D509 Iron deficiency anemia, unspecified: Secondary | ICD-10-CM | POA: Diagnosis not present

## 2020-04-25 DIAGNOSIS — N2581 Secondary hyperparathyroidism of renal origin: Secondary | ICD-10-CM | POA: Diagnosis not present

## 2020-04-25 DIAGNOSIS — Z992 Dependence on renal dialysis: Secondary | ICD-10-CM | POA: Diagnosis not present

## 2020-04-27 DIAGNOSIS — N186 End stage renal disease: Secondary | ICD-10-CM | POA: Diagnosis not present

## 2020-04-27 DIAGNOSIS — N2581 Secondary hyperparathyroidism of renal origin: Secondary | ICD-10-CM | POA: Diagnosis not present

## 2020-04-27 DIAGNOSIS — Z992 Dependence on renal dialysis: Secondary | ICD-10-CM | POA: Diagnosis not present

## 2020-04-27 DIAGNOSIS — D509 Iron deficiency anemia, unspecified: Secondary | ICD-10-CM | POA: Diagnosis not present

## 2020-04-29 DIAGNOSIS — N186 End stage renal disease: Secondary | ICD-10-CM | POA: Diagnosis not present

## 2020-04-29 DIAGNOSIS — Z992 Dependence on renal dialysis: Secondary | ICD-10-CM | POA: Diagnosis not present

## 2020-04-29 DIAGNOSIS — N033 Chronic nephritic syndrome with diffuse mesangial proliferative glomerulonephritis: Secondary | ICD-10-CM | POA: Diagnosis not present

## 2020-04-30 DIAGNOSIS — N186 End stage renal disease: Secondary | ICD-10-CM | POA: Diagnosis not present

## 2020-04-30 DIAGNOSIS — Z992 Dependence on renal dialysis: Secondary | ICD-10-CM | POA: Diagnosis not present

## 2020-04-30 DIAGNOSIS — D509 Iron deficiency anemia, unspecified: Secondary | ICD-10-CM | POA: Diagnosis not present

## 2020-04-30 DIAGNOSIS — N2581 Secondary hyperparathyroidism of renal origin: Secondary | ICD-10-CM | POA: Diagnosis not present

## 2020-04-30 DIAGNOSIS — D631 Anemia in chronic kidney disease: Secondary | ICD-10-CM | POA: Diagnosis not present

## 2020-05-02 DIAGNOSIS — D509 Iron deficiency anemia, unspecified: Secondary | ICD-10-CM | POA: Diagnosis not present

## 2020-05-02 DIAGNOSIS — D631 Anemia in chronic kidney disease: Secondary | ICD-10-CM | POA: Diagnosis not present

## 2020-05-02 DIAGNOSIS — N2581 Secondary hyperparathyroidism of renal origin: Secondary | ICD-10-CM | POA: Diagnosis not present

## 2020-05-02 DIAGNOSIS — Z992 Dependence on renal dialysis: Secondary | ICD-10-CM | POA: Diagnosis not present

## 2020-05-02 DIAGNOSIS — N186 End stage renal disease: Secondary | ICD-10-CM | POA: Diagnosis not present

## 2020-05-04 DIAGNOSIS — D509 Iron deficiency anemia, unspecified: Secondary | ICD-10-CM | POA: Diagnosis not present

## 2020-05-04 DIAGNOSIS — N186 End stage renal disease: Secondary | ICD-10-CM | POA: Diagnosis not present

## 2020-05-04 DIAGNOSIS — Z992 Dependence on renal dialysis: Secondary | ICD-10-CM | POA: Diagnosis not present

## 2020-05-04 DIAGNOSIS — N2581 Secondary hyperparathyroidism of renal origin: Secondary | ICD-10-CM | POA: Diagnosis not present

## 2020-05-04 DIAGNOSIS — D631 Anemia in chronic kidney disease: Secondary | ICD-10-CM | POA: Diagnosis not present

## 2020-05-07 DIAGNOSIS — N2581 Secondary hyperparathyroidism of renal origin: Secondary | ICD-10-CM | POA: Diagnosis not present

## 2020-05-07 DIAGNOSIS — Z992 Dependence on renal dialysis: Secondary | ICD-10-CM | POA: Diagnosis not present

## 2020-05-07 DIAGNOSIS — D631 Anemia in chronic kidney disease: Secondary | ICD-10-CM | POA: Diagnosis not present

## 2020-05-07 DIAGNOSIS — D509 Iron deficiency anemia, unspecified: Secondary | ICD-10-CM | POA: Diagnosis not present

## 2020-05-07 DIAGNOSIS — N186 End stage renal disease: Secondary | ICD-10-CM | POA: Diagnosis not present

## 2020-05-09 DIAGNOSIS — D509 Iron deficiency anemia, unspecified: Secondary | ICD-10-CM | POA: Diagnosis not present

## 2020-05-09 DIAGNOSIS — D631 Anemia in chronic kidney disease: Secondary | ICD-10-CM | POA: Diagnosis not present

## 2020-05-09 DIAGNOSIS — N2581 Secondary hyperparathyroidism of renal origin: Secondary | ICD-10-CM | POA: Diagnosis not present

## 2020-05-09 DIAGNOSIS — Z992 Dependence on renal dialysis: Secondary | ICD-10-CM | POA: Diagnosis not present

## 2020-05-09 DIAGNOSIS — N186 End stage renal disease: Secondary | ICD-10-CM | POA: Diagnosis not present

## 2020-05-11 DIAGNOSIS — D631 Anemia in chronic kidney disease: Secondary | ICD-10-CM | POA: Diagnosis not present

## 2020-05-11 DIAGNOSIS — N186 End stage renal disease: Secondary | ICD-10-CM | POA: Diagnosis not present

## 2020-05-11 DIAGNOSIS — D509 Iron deficiency anemia, unspecified: Secondary | ICD-10-CM | POA: Diagnosis not present

## 2020-05-11 DIAGNOSIS — N2581 Secondary hyperparathyroidism of renal origin: Secondary | ICD-10-CM | POA: Diagnosis not present

## 2020-05-11 DIAGNOSIS — Z992 Dependence on renal dialysis: Secondary | ICD-10-CM | POA: Diagnosis not present

## 2020-05-14 DIAGNOSIS — N186 End stage renal disease: Secondary | ICD-10-CM | POA: Diagnosis not present

## 2020-05-14 DIAGNOSIS — Z992 Dependence on renal dialysis: Secondary | ICD-10-CM | POA: Diagnosis not present

## 2020-05-14 DIAGNOSIS — D509 Iron deficiency anemia, unspecified: Secondary | ICD-10-CM | POA: Diagnosis not present

## 2020-05-14 DIAGNOSIS — N2581 Secondary hyperparathyroidism of renal origin: Secondary | ICD-10-CM | POA: Diagnosis not present

## 2020-05-14 DIAGNOSIS — D631 Anemia in chronic kidney disease: Secondary | ICD-10-CM | POA: Diagnosis not present

## 2020-05-15 DIAGNOSIS — Z20822 Contact with and (suspected) exposure to covid-19: Secondary | ICD-10-CM | POA: Diagnosis not present

## 2020-05-16 DIAGNOSIS — N186 End stage renal disease: Secondary | ICD-10-CM | POA: Diagnosis not present

## 2020-05-16 DIAGNOSIS — D631 Anemia in chronic kidney disease: Secondary | ICD-10-CM | POA: Diagnosis not present

## 2020-05-16 DIAGNOSIS — Z992 Dependence on renal dialysis: Secondary | ICD-10-CM | POA: Diagnosis not present

## 2020-05-16 DIAGNOSIS — D509 Iron deficiency anemia, unspecified: Secondary | ICD-10-CM | POA: Diagnosis not present

## 2020-05-16 DIAGNOSIS — N2581 Secondary hyperparathyroidism of renal origin: Secondary | ICD-10-CM | POA: Diagnosis not present

## 2020-05-18 DIAGNOSIS — D631 Anemia in chronic kidney disease: Secondary | ICD-10-CM | POA: Diagnosis not present

## 2020-05-18 DIAGNOSIS — Z992 Dependence on renal dialysis: Secondary | ICD-10-CM | POA: Diagnosis not present

## 2020-05-18 DIAGNOSIS — N186 End stage renal disease: Secondary | ICD-10-CM | POA: Diagnosis not present

## 2020-05-18 DIAGNOSIS — N2581 Secondary hyperparathyroidism of renal origin: Secondary | ICD-10-CM | POA: Diagnosis not present

## 2020-05-18 DIAGNOSIS — D509 Iron deficiency anemia, unspecified: Secondary | ICD-10-CM | POA: Diagnosis not present

## 2020-05-21 DIAGNOSIS — N186 End stage renal disease: Secondary | ICD-10-CM | POA: Diagnosis not present

## 2020-05-21 DIAGNOSIS — Z992 Dependence on renal dialysis: Secondary | ICD-10-CM | POA: Diagnosis not present

## 2020-05-21 DIAGNOSIS — D509 Iron deficiency anemia, unspecified: Secondary | ICD-10-CM | POA: Diagnosis not present

## 2020-05-21 DIAGNOSIS — N2581 Secondary hyperparathyroidism of renal origin: Secondary | ICD-10-CM | POA: Diagnosis not present

## 2020-05-21 DIAGNOSIS — D631 Anemia in chronic kidney disease: Secondary | ICD-10-CM | POA: Diagnosis not present

## 2020-05-23 DIAGNOSIS — N186 End stage renal disease: Secondary | ICD-10-CM | POA: Diagnosis not present

## 2020-05-23 DIAGNOSIS — Z992 Dependence on renal dialysis: Secondary | ICD-10-CM | POA: Diagnosis not present

## 2020-05-23 DIAGNOSIS — D509 Iron deficiency anemia, unspecified: Secondary | ICD-10-CM | POA: Diagnosis not present

## 2020-05-23 DIAGNOSIS — D631 Anemia in chronic kidney disease: Secondary | ICD-10-CM | POA: Diagnosis not present

## 2020-05-23 DIAGNOSIS — N2581 Secondary hyperparathyroidism of renal origin: Secondary | ICD-10-CM | POA: Diagnosis not present

## 2020-05-25 DIAGNOSIS — Z992 Dependence on renal dialysis: Secondary | ICD-10-CM | POA: Diagnosis not present

## 2020-05-25 DIAGNOSIS — N2581 Secondary hyperparathyroidism of renal origin: Secondary | ICD-10-CM | POA: Diagnosis not present

## 2020-05-25 DIAGNOSIS — D509 Iron deficiency anemia, unspecified: Secondary | ICD-10-CM | POA: Diagnosis not present

## 2020-05-25 DIAGNOSIS — N186 End stage renal disease: Secondary | ICD-10-CM | POA: Diagnosis not present

## 2020-05-25 DIAGNOSIS — D631 Anemia in chronic kidney disease: Secondary | ICD-10-CM | POA: Diagnosis not present

## 2020-05-28 DIAGNOSIS — D509 Iron deficiency anemia, unspecified: Secondary | ICD-10-CM | POA: Diagnosis not present

## 2020-05-28 DIAGNOSIS — D631 Anemia in chronic kidney disease: Secondary | ICD-10-CM | POA: Diagnosis not present

## 2020-05-28 DIAGNOSIS — Z992 Dependence on renal dialysis: Secondary | ICD-10-CM | POA: Diagnosis not present

## 2020-05-28 DIAGNOSIS — N186 End stage renal disease: Secondary | ICD-10-CM | POA: Diagnosis not present

## 2020-05-28 DIAGNOSIS — N2581 Secondary hyperparathyroidism of renal origin: Secondary | ICD-10-CM | POA: Diagnosis not present

## 2020-05-30 DIAGNOSIS — N033 Chronic nephritic syndrome with diffuse mesangial proliferative glomerulonephritis: Secondary | ICD-10-CM | POA: Diagnosis not present

## 2020-05-30 DIAGNOSIS — N186 End stage renal disease: Secondary | ICD-10-CM | POA: Diagnosis not present

## 2020-05-30 DIAGNOSIS — D631 Anemia in chronic kidney disease: Secondary | ICD-10-CM | POA: Diagnosis not present

## 2020-05-30 DIAGNOSIS — N2581 Secondary hyperparathyroidism of renal origin: Secondary | ICD-10-CM | POA: Diagnosis not present

## 2020-05-30 DIAGNOSIS — Z992 Dependence on renal dialysis: Secondary | ICD-10-CM | POA: Diagnosis not present

## 2020-06-01 DIAGNOSIS — N186 End stage renal disease: Secondary | ICD-10-CM | POA: Diagnosis not present

## 2020-06-01 DIAGNOSIS — D631 Anemia in chronic kidney disease: Secondary | ICD-10-CM | POA: Diagnosis not present

## 2020-06-01 DIAGNOSIS — Z992 Dependence on renal dialysis: Secondary | ICD-10-CM | POA: Diagnosis not present

## 2020-06-01 DIAGNOSIS — N2581 Secondary hyperparathyroidism of renal origin: Secondary | ICD-10-CM | POA: Diagnosis not present

## 2020-06-04 DIAGNOSIS — D631 Anemia in chronic kidney disease: Secondary | ICD-10-CM | POA: Diagnosis not present

## 2020-06-04 DIAGNOSIS — N2581 Secondary hyperparathyroidism of renal origin: Secondary | ICD-10-CM | POA: Diagnosis not present

## 2020-06-04 DIAGNOSIS — N186 End stage renal disease: Secondary | ICD-10-CM | POA: Diagnosis not present

## 2020-06-04 DIAGNOSIS — Z992 Dependence on renal dialysis: Secondary | ICD-10-CM | POA: Diagnosis not present

## 2020-06-06 DIAGNOSIS — N186 End stage renal disease: Secondary | ICD-10-CM | POA: Diagnosis not present

## 2020-06-06 DIAGNOSIS — D631 Anemia in chronic kidney disease: Secondary | ICD-10-CM | POA: Diagnosis not present

## 2020-06-06 DIAGNOSIS — N2581 Secondary hyperparathyroidism of renal origin: Secondary | ICD-10-CM | POA: Diagnosis not present

## 2020-06-06 DIAGNOSIS — Z992 Dependence on renal dialysis: Secondary | ICD-10-CM | POA: Diagnosis not present

## 2020-06-08 DIAGNOSIS — N186 End stage renal disease: Secondary | ICD-10-CM | POA: Diagnosis not present

## 2020-06-08 DIAGNOSIS — N2581 Secondary hyperparathyroidism of renal origin: Secondary | ICD-10-CM | POA: Diagnosis not present

## 2020-06-08 DIAGNOSIS — Z992 Dependence on renal dialysis: Secondary | ICD-10-CM | POA: Diagnosis not present

## 2020-06-08 DIAGNOSIS — D631 Anemia in chronic kidney disease: Secondary | ICD-10-CM | POA: Diagnosis not present

## 2020-06-11 DIAGNOSIS — N186 End stage renal disease: Secondary | ICD-10-CM | POA: Diagnosis not present

## 2020-06-11 DIAGNOSIS — N2581 Secondary hyperparathyroidism of renal origin: Secondary | ICD-10-CM | POA: Diagnosis not present

## 2020-06-11 DIAGNOSIS — Z992 Dependence on renal dialysis: Secondary | ICD-10-CM | POA: Diagnosis not present

## 2020-06-11 DIAGNOSIS — D631 Anemia in chronic kidney disease: Secondary | ICD-10-CM | POA: Diagnosis not present

## 2020-06-13 DIAGNOSIS — N186 End stage renal disease: Secondary | ICD-10-CM | POA: Diagnosis not present

## 2020-06-13 DIAGNOSIS — D631 Anemia in chronic kidney disease: Secondary | ICD-10-CM | POA: Diagnosis not present

## 2020-06-13 DIAGNOSIS — N2581 Secondary hyperparathyroidism of renal origin: Secondary | ICD-10-CM | POA: Diagnosis not present

## 2020-06-13 DIAGNOSIS — Z992 Dependence on renal dialysis: Secondary | ICD-10-CM | POA: Diagnosis not present

## 2020-06-15 DIAGNOSIS — D631 Anemia in chronic kidney disease: Secondary | ICD-10-CM | POA: Diagnosis not present

## 2020-06-15 DIAGNOSIS — N2581 Secondary hyperparathyroidism of renal origin: Secondary | ICD-10-CM | POA: Diagnosis not present

## 2020-06-15 DIAGNOSIS — N186 End stage renal disease: Secondary | ICD-10-CM | POA: Diagnosis not present

## 2020-06-15 DIAGNOSIS — Z992 Dependence on renal dialysis: Secondary | ICD-10-CM | POA: Diagnosis not present

## 2020-06-18 DIAGNOSIS — N186 End stage renal disease: Secondary | ICD-10-CM | POA: Diagnosis not present

## 2020-06-18 DIAGNOSIS — N2581 Secondary hyperparathyroidism of renal origin: Secondary | ICD-10-CM | POA: Diagnosis not present

## 2020-06-18 DIAGNOSIS — D631 Anemia in chronic kidney disease: Secondary | ICD-10-CM | POA: Diagnosis not present

## 2020-06-18 DIAGNOSIS — Z992 Dependence on renal dialysis: Secondary | ICD-10-CM | POA: Diagnosis not present

## 2020-06-20 DIAGNOSIS — N2581 Secondary hyperparathyroidism of renal origin: Secondary | ICD-10-CM | POA: Diagnosis not present

## 2020-06-20 DIAGNOSIS — Z992 Dependence on renal dialysis: Secondary | ICD-10-CM | POA: Diagnosis not present

## 2020-06-20 DIAGNOSIS — D631 Anemia in chronic kidney disease: Secondary | ICD-10-CM | POA: Diagnosis not present

## 2020-06-20 DIAGNOSIS — N186 End stage renal disease: Secondary | ICD-10-CM | POA: Diagnosis not present

## 2020-06-22 DIAGNOSIS — D631 Anemia in chronic kidney disease: Secondary | ICD-10-CM | POA: Diagnosis not present

## 2020-06-22 DIAGNOSIS — N186 End stage renal disease: Secondary | ICD-10-CM | POA: Diagnosis not present

## 2020-06-22 DIAGNOSIS — Z992 Dependence on renal dialysis: Secondary | ICD-10-CM | POA: Diagnosis not present

## 2020-06-22 DIAGNOSIS — Z20822 Contact with and (suspected) exposure to covid-19: Secondary | ICD-10-CM | POA: Diagnosis not present

## 2020-06-22 DIAGNOSIS — N2581 Secondary hyperparathyroidism of renal origin: Secondary | ICD-10-CM | POA: Diagnosis not present

## 2020-06-25 DIAGNOSIS — N2581 Secondary hyperparathyroidism of renal origin: Secondary | ICD-10-CM | POA: Diagnosis not present

## 2020-06-25 DIAGNOSIS — Z992 Dependence on renal dialysis: Secondary | ICD-10-CM | POA: Diagnosis not present

## 2020-06-25 DIAGNOSIS — N186 End stage renal disease: Secondary | ICD-10-CM | POA: Diagnosis not present

## 2020-06-25 DIAGNOSIS — D631 Anemia in chronic kidney disease: Secondary | ICD-10-CM | POA: Diagnosis not present

## 2020-06-27 DIAGNOSIS — N186 End stage renal disease: Secondary | ICD-10-CM | POA: Diagnosis not present

## 2020-06-27 DIAGNOSIS — N2581 Secondary hyperparathyroidism of renal origin: Secondary | ICD-10-CM | POA: Diagnosis not present

## 2020-06-27 DIAGNOSIS — D631 Anemia in chronic kidney disease: Secondary | ICD-10-CM | POA: Diagnosis not present

## 2020-06-27 DIAGNOSIS — Z992 Dependence on renal dialysis: Secondary | ICD-10-CM | POA: Diagnosis not present

## 2020-06-29 DIAGNOSIS — N186 End stage renal disease: Secondary | ICD-10-CM | POA: Diagnosis not present

## 2020-06-29 DIAGNOSIS — Z992 Dependence on renal dialysis: Secondary | ICD-10-CM | POA: Diagnosis not present

## 2020-06-29 DIAGNOSIS — N033 Chronic nephritic syndrome with diffuse mesangial proliferative glomerulonephritis: Secondary | ICD-10-CM | POA: Diagnosis not present

## 2020-06-29 DIAGNOSIS — N2581 Secondary hyperparathyroidism of renal origin: Secondary | ICD-10-CM | POA: Diagnosis not present

## 2020-07-02 DIAGNOSIS — N186 End stage renal disease: Secondary | ICD-10-CM | POA: Diagnosis not present

## 2020-07-02 DIAGNOSIS — Z992 Dependence on renal dialysis: Secondary | ICD-10-CM | POA: Diagnosis not present

## 2020-07-02 DIAGNOSIS — N2581 Secondary hyperparathyroidism of renal origin: Secondary | ICD-10-CM | POA: Diagnosis not present

## 2020-07-04 DIAGNOSIS — N186 End stage renal disease: Secondary | ICD-10-CM | POA: Diagnosis not present

## 2020-07-04 DIAGNOSIS — Z992 Dependence on renal dialysis: Secondary | ICD-10-CM | POA: Diagnosis not present

## 2020-07-04 DIAGNOSIS — N2581 Secondary hyperparathyroidism of renal origin: Secondary | ICD-10-CM | POA: Diagnosis not present

## 2020-07-05 DIAGNOSIS — N2581 Secondary hyperparathyroidism of renal origin: Secondary | ICD-10-CM | POA: Diagnosis not present

## 2020-07-05 DIAGNOSIS — Z992 Dependence on renal dialysis: Secondary | ICD-10-CM | POA: Diagnosis not present

## 2020-07-05 DIAGNOSIS — N186 End stage renal disease: Secondary | ICD-10-CM | POA: Diagnosis not present

## 2020-07-09 DIAGNOSIS — N186 End stage renal disease: Secondary | ICD-10-CM | POA: Diagnosis not present

## 2020-07-09 DIAGNOSIS — Z992 Dependence on renal dialysis: Secondary | ICD-10-CM | POA: Diagnosis not present

## 2020-07-09 DIAGNOSIS — N2581 Secondary hyperparathyroidism of renal origin: Secondary | ICD-10-CM | POA: Diagnosis not present

## 2020-07-11 DIAGNOSIS — N2581 Secondary hyperparathyroidism of renal origin: Secondary | ICD-10-CM | POA: Diagnosis not present

## 2020-07-11 DIAGNOSIS — Z992 Dependence on renal dialysis: Secondary | ICD-10-CM | POA: Diagnosis not present

## 2020-07-11 DIAGNOSIS — N186 End stage renal disease: Secondary | ICD-10-CM | POA: Diagnosis not present

## 2020-07-13 DIAGNOSIS — N186 End stage renal disease: Secondary | ICD-10-CM | POA: Diagnosis not present

## 2020-07-13 DIAGNOSIS — Z992 Dependence on renal dialysis: Secondary | ICD-10-CM | POA: Diagnosis not present

## 2020-07-13 DIAGNOSIS — N2581 Secondary hyperparathyroidism of renal origin: Secondary | ICD-10-CM | POA: Diagnosis not present

## 2020-07-16 DIAGNOSIS — N2581 Secondary hyperparathyroidism of renal origin: Secondary | ICD-10-CM | POA: Diagnosis not present

## 2020-07-16 DIAGNOSIS — N186 End stage renal disease: Secondary | ICD-10-CM | POA: Diagnosis not present

## 2020-07-16 DIAGNOSIS — Z992 Dependence on renal dialysis: Secondary | ICD-10-CM | POA: Diagnosis not present

## 2020-07-18 DIAGNOSIS — N2581 Secondary hyperparathyroidism of renal origin: Secondary | ICD-10-CM | POA: Diagnosis not present

## 2020-07-18 DIAGNOSIS — Z992 Dependence on renal dialysis: Secondary | ICD-10-CM | POA: Diagnosis not present

## 2020-07-18 DIAGNOSIS — N186 End stage renal disease: Secondary | ICD-10-CM | POA: Diagnosis not present

## 2020-07-20 DIAGNOSIS — N2581 Secondary hyperparathyroidism of renal origin: Secondary | ICD-10-CM | POA: Diagnosis not present

## 2020-07-20 DIAGNOSIS — Z992 Dependence on renal dialysis: Secondary | ICD-10-CM | POA: Diagnosis not present

## 2020-07-20 DIAGNOSIS — N186 End stage renal disease: Secondary | ICD-10-CM | POA: Diagnosis not present

## 2020-07-23 DIAGNOSIS — Z992 Dependence on renal dialysis: Secondary | ICD-10-CM | POA: Diagnosis not present

## 2020-07-23 DIAGNOSIS — N186 End stage renal disease: Secondary | ICD-10-CM | POA: Diagnosis not present

## 2020-07-23 DIAGNOSIS — N2581 Secondary hyperparathyroidism of renal origin: Secondary | ICD-10-CM | POA: Diagnosis not present

## 2020-07-25 DIAGNOSIS — N2581 Secondary hyperparathyroidism of renal origin: Secondary | ICD-10-CM | POA: Diagnosis not present

## 2020-07-25 DIAGNOSIS — N186 End stage renal disease: Secondary | ICD-10-CM | POA: Diagnosis not present

## 2020-07-25 DIAGNOSIS — Z992 Dependence on renal dialysis: Secondary | ICD-10-CM | POA: Diagnosis not present

## 2020-07-27 DIAGNOSIS — N186 End stage renal disease: Secondary | ICD-10-CM | POA: Diagnosis not present

## 2020-07-27 DIAGNOSIS — N2581 Secondary hyperparathyroidism of renal origin: Secondary | ICD-10-CM | POA: Diagnosis not present

## 2020-07-27 DIAGNOSIS — Z992 Dependence on renal dialysis: Secondary | ICD-10-CM | POA: Diagnosis not present

## 2020-07-30 DIAGNOSIS — N186 End stage renal disease: Secondary | ICD-10-CM | POA: Diagnosis not present

## 2020-07-30 DIAGNOSIS — D631 Anemia in chronic kidney disease: Secondary | ICD-10-CM | POA: Diagnosis not present

## 2020-07-30 DIAGNOSIS — N2581 Secondary hyperparathyroidism of renal origin: Secondary | ICD-10-CM | POA: Diagnosis not present

## 2020-07-30 DIAGNOSIS — N033 Chronic nephritic syndrome with diffuse mesangial proliferative glomerulonephritis: Secondary | ICD-10-CM | POA: Diagnosis not present

## 2020-07-30 DIAGNOSIS — Z992 Dependence on renal dialysis: Secondary | ICD-10-CM | POA: Diagnosis not present

## 2020-08-01 DIAGNOSIS — N2581 Secondary hyperparathyroidism of renal origin: Secondary | ICD-10-CM | POA: Diagnosis not present

## 2020-08-01 DIAGNOSIS — N186 End stage renal disease: Secondary | ICD-10-CM | POA: Diagnosis not present

## 2020-08-01 DIAGNOSIS — D631 Anemia in chronic kidney disease: Secondary | ICD-10-CM | POA: Diagnosis not present

## 2020-08-01 DIAGNOSIS — Z992 Dependence on renal dialysis: Secondary | ICD-10-CM | POA: Diagnosis not present

## 2020-08-03 DIAGNOSIS — N2581 Secondary hyperparathyroidism of renal origin: Secondary | ICD-10-CM | POA: Diagnosis not present

## 2020-08-03 DIAGNOSIS — Z992 Dependence on renal dialysis: Secondary | ICD-10-CM | POA: Diagnosis not present

## 2020-08-03 DIAGNOSIS — N186 End stage renal disease: Secondary | ICD-10-CM | POA: Diagnosis not present

## 2020-08-06 DIAGNOSIS — Z992 Dependence on renal dialysis: Secondary | ICD-10-CM | POA: Diagnosis not present

## 2020-08-06 DIAGNOSIS — D631 Anemia in chronic kidney disease: Secondary | ICD-10-CM | POA: Diagnosis not present

## 2020-08-06 DIAGNOSIS — N2581 Secondary hyperparathyroidism of renal origin: Secondary | ICD-10-CM | POA: Diagnosis not present

## 2020-08-06 DIAGNOSIS — N186 End stage renal disease: Secondary | ICD-10-CM | POA: Diagnosis not present

## 2020-08-08 DIAGNOSIS — N186 End stage renal disease: Secondary | ICD-10-CM | POA: Diagnosis not present

## 2020-08-08 DIAGNOSIS — N2581 Secondary hyperparathyroidism of renal origin: Secondary | ICD-10-CM | POA: Diagnosis not present

## 2020-08-08 DIAGNOSIS — D631 Anemia in chronic kidney disease: Secondary | ICD-10-CM | POA: Diagnosis not present

## 2020-08-08 DIAGNOSIS — Z992 Dependence on renal dialysis: Secondary | ICD-10-CM | POA: Diagnosis not present

## 2020-08-10 DIAGNOSIS — N2581 Secondary hyperparathyroidism of renal origin: Secondary | ICD-10-CM | POA: Diagnosis not present

## 2020-08-10 DIAGNOSIS — Z992 Dependence on renal dialysis: Secondary | ICD-10-CM | POA: Diagnosis not present

## 2020-08-10 DIAGNOSIS — N186 End stage renal disease: Secondary | ICD-10-CM | POA: Diagnosis not present

## 2020-08-10 DIAGNOSIS — D631 Anemia in chronic kidney disease: Secondary | ICD-10-CM | POA: Diagnosis not present

## 2020-08-13 DIAGNOSIS — Z992 Dependence on renal dialysis: Secondary | ICD-10-CM | POA: Diagnosis not present

## 2020-08-13 DIAGNOSIS — N2581 Secondary hyperparathyroidism of renal origin: Secondary | ICD-10-CM | POA: Diagnosis not present

## 2020-08-13 DIAGNOSIS — D631 Anemia in chronic kidney disease: Secondary | ICD-10-CM | POA: Diagnosis not present

## 2020-08-13 DIAGNOSIS — N186 End stage renal disease: Secondary | ICD-10-CM | POA: Diagnosis not present

## 2020-08-15 DIAGNOSIS — N186 End stage renal disease: Secondary | ICD-10-CM | POA: Diagnosis not present

## 2020-08-15 DIAGNOSIS — N2581 Secondary hyperparathyroidism of renal origin: Secondary | ICD-10-CM | POA: Diagnosis not present

## 2020-08-15 DIAGNOSIS — D631 Anemia in chronic kidney disease: Secondary | ICD-10-CM | POA: Diagnosis not present

## 2020-08-15 DIAGNOSIS — Z992 Dependence on renal dialysis: Secondary | ICD-10-CM | POA: Diagnosis not present

## 2020-08-17 DIAGNOSIS — N2581 Secondary hyperparathyroidism of renal origin: Secondary | ICD-10-CM | POA: Diagnosis not present

## 2020-08-17 DIAGNOSIS — D631 Anemia in chronic kidney disease: Secondary | ICD-10-CM | POA: Diagnosis not present

## 2020-08-17 DIAGNOSIS — N186 End stage renal disease: Secondary | ICD-10-CM | POA: Diagnosis not present

## 2020-08-17 DIAGNOSIS — Z992 Dependence on renal dialysis: Secondary | ICD-10-CM | POA: Diagnosis not present

## 2020-08-19 DIAGNOSIS — N2581 Secondary hyperparathyroidism of renal origin: Secondary | ICD-10-CM | POA: Diagnosis not present

## 2020-08-19 DIAGNOSIS — N186 End stage renal disease: Secondary | ICD-10-CM | POA: Diagnosis not present

## 2020-08-19 DIAGNOSIS — D631 Anemia in chronic kidney disease: Secondary | ICD-10-CM | POA: Diagnosis not present

## 2020-08-19 DIAGNOSIS — Z992 Dependence on renal dialysis: Secondary | ICD-10-CM | POA: Diagnosis not present

## 2020-08-20 DIAGNOSIS — Z992 Dependence on renal dialysis: Secondary | ICD-10-CM | POA: Diagnosis not present

## 2020-08-20 DIAGNOSIS — D631 Anemia in chronic kidney disease: Secondary | ICD-10-CM | POA: Diagnosis not present

## 2020-08-20 DIAGNOSIS — N2581 Secondary hyperparathyroidism of renal origin: Secondary | ICD-10-CM | POA: Diagnosis not present

## 2020-08-20 DIAGNOSIS — N186 End stage renal disease: Secondary | ICD-10-CM | POA: Diagnosis not present

## 2020-08-24 DIAGNOSIS — N2581 Secondary hyperparathyroidism of renal origin: Secondary | ICD-10-CM | POA: Diagnosis not present

## 2020-08-24 DIAGNOSIS — D631 Anemia in chronic kidney disease: Secondary | ICD-10-CM | POA: Diagnosis not present

## 2020-08-24 DIAGNOSIS — N186 End stage renal disease: Secondary | ICD-10-CM | POA: Diagnosis not present

## 2020-08-24 DIAGNOSIS — Z992 Dependence on renal dialysis: Secondary | ICD-10-CM | POA: Diagnosis not present

## 2020-08-27 DIAGNOSIS — N186 End stage renal disease: Secondary | ICD-10-CM | POA: Diagnosis not present

## 2020-08-27 DIAGNOSIS — N2581 Secondary hyperparathyroidism of renal origin: Secondary | ICD-10-CM | POA: Diagnosis not present

## 2020-08-27 DIAGNOSIS — Z992 Dependence on renal dialysis: Secondary | ICD-10-CM | POA: Diagnosis not present

## 2020-08-27 DIAGNOSIS — D631 Anemia in chronic kidney disease: Secondary | ICD-10-CM | POA: Diagnosis not present

## 2020-08-29 DIAGNOSIS — N2581 Secondary hyperparathyroidism of renal origin: Secondary | ICD-10-CM | POA: Diagnosis not present

## 2020-08-29 DIAGNOSIS — D631 Anemia in chronic kidney disease: Secondary | ICD-10-CM | POA: Diagnosis not present

## 2020-08-29 DIAGNOSIS — N033 Chronic nephritic syndrome with diffuse mesangial proliferative glomerulonephritis: Secondary | ICD-10-CM | POA: Diagnosis not present

## 2020-08-29 DIAGNOSIS — D509 Iron deficiency anemia, unspecified: Secondary | ICD-10-CM | POA: Diagnosis not present

## 2020-08-29 DIAGNOSIS — Z992 Dependence on renal dialysis: Secondary | ICD-10-CM | POA: Diagnosis not present

## 2020-08-29 DIAGNOSIS — N186 End stage renal disease: Secondary | ICD-10-CM | POA: Diagnosis not present

## 2020-08-31 DIAGNOSIS — Z992 Dependence on renal dialysis: Secondary | ICD-10-CM | POA: Diagnosis not present

## 2020-08-31 DIAGNOSIS — D509 Iron deficiency anemia, unspecified: Secondary | ICD-10-CM | POA: Diagnosis not present

## 2020-08-31 DIAGNOSIS — N2581 Secondary hyperparathyroidism of renal origin: Secondary | ICD-10-CM | POA: Diagnosis not present

## 2020-08-31 DIAGNOSIS — N186 End stage renal disease: Secondary | ICD-10-CM | POA: Diagnosis not present

## 2020-08-31 DIAGNOSIS — D631 Anemia in chronic kidney disease: Secondary | ICD-10-CM | POA: Diagnosis not present

## 2020-09-03 DIAGNOSIS — N2581 Secondary hyperparathyroidism of renal origin: Secondary | ICD-10-CM | POA: Diagnosis not present

## 2020-09-03 DIAGNOSIS — N186 End stage renal disease: Secondary | ICD-10-CM | POA: Diagnosis not present

## 2020-09-03 DIAGNOSIS — D631 Anemia in chronic kidney disease: Secondary | ICD-10-CM | POA: Diagnosis not present

## 2020-09-03 DIAGNOSIS — D509 Iron deficiency anemia, unspecified: Secondary | ICD-10-CM | POA: Diagnosis not present

## 2020-09-03 DIAGNOSIS — Z992 Dependence on renal dialysis: Secondary | ICD-10-CM | POA: Diagnosis not present

## 2020-09-05 DIAGNOSIS — N2581 Secondary hyperparathyroidism of renal origin: Secondary | ICD-10-CM | POA: Diagnosis not present

## 2020-09-05 DIAGNOSIS — Z992 Dependence on renal dialysis: Secondary | ICD-10-CM | POA: Diagnosis not present

## 2020-09-05 DIAGNOSIS — N186 End stage renal disease: Secondary | ICD-10-CM | POA: Diagnosis not present

## 2020-09-05 DIAGNOSIS — D631 Anemia in chronic kidney disease: Secondary | ICD-10-CM | POA: Diagnosis not present

## 2020-09-05 DIAGNOSIS — D509 Iron deficiency anemia, unspecified: Secondary | ICD-10-CM | POA: Diagnosis not present

## 2020-09-07 DIAGNOSIS — N2581 Secondary hyperparathyroidism of renal origin: Secondary | ICD-10-CM | POA: Diagnosis not present

## 2020-09-07 DIAGNOSIS — D631 Anemia in chronic kidney disease: Secondary | ICD-10-CM | POA: Diagnosis not present

## 2020-09-07 DIAGNOSIS — Z992 Dependence on renal dialysis: Secondary | ICD-10-CM | POA: Diagnosis not present

## 2020-09-07 DIAGNOSIS — D509 Iron deficiency anemia, unspecified: Secondary | ICD-10-CM | POA: Diagnosis not present

## 2020-09-07 DIAGNOSIS — N186 End stage renal disease: Secondary | ICD-10-CM | POA: Diagnosis not present

## 2020-09-10 DIAGNOSIS — D509 Iron deficiency anemia, unspecified: Secondary | ICD-10-CM | POA: Diagnosis not present

## 2020-09-10 DIAGNOSIS — Z992 Dependence on renal dialysis: Secondary | ICD-10-CM | POA: Diagnosis not present

## 2020-09-10 DIAGNOSIS — N2581 Secondary hyperparathyroidism of renal origin: Secondary | ICD-10-CM | POA: Diagnosis not present

## 2020-09-10 DIAGNOSIS — D631 Anemia in chronic kidney disease: Secondary | ICD-10-CM | POA: Diagnosis not present

## 2020-09-10 DIAGNOSIS — N186 End stage renal disease: Secondary | ICD-10-CM | POA: Diagnosis not present

## 2020-09-12 DIAGNOSIS — Z992 Dependence on renal dialysis: Secondary | ICD-10-CM | POA: Diagnosis not present

## 2020-09-12 DIAGNOSIS — D509 Iron deficiency anemia, unspecified: Secondary | ICD-10-CM | POA: Diagnosis not present

## 2020-09-12 DIAGNOSIS — N186 End stage renal disease: Secondary | ICD-10-CM | POA: Diagnosis not present

## 2020-09-12 DIAGNOSIS — D631 Anemia in chronic kidney disease: Secondary | ICD-10-CM | POA: Diagnosis not present

## 2020-09-12 DIAGNOSIS — N2581 Secondary hyperparathyroidism of renal origin: Secondary | ICD-10-CM | POA: Diagnosis not present

## 2020-09-14 DIAGNOSIS — D631 Anemia in chronic kidney disease: Secondary | ICD-10-CM | POA: Diagnosis not present

## 2020-09-14 DIAGNOSIS — D509 Iron deficiency anemia, unspecified: Secondary | ICD-10-CM | POA: Diagnosis not present

## 2020-09-14 DIAGNOSIS — Z992 Dependence on renal dialysis: Secondary | ICD-10-CM | POA: Diagnosis not present

## 2020-09-14 DIAGNOSIS — N186 End stage renal disease: Secondary | ICD-10-CM | POA: Diagnosis not present

## 2020-09-14 DIAGNOSIS — N2581 Secondary hyperparathyroidism of renal origin: Secondary | ICD-10-CM | POA: Diagnosis not present

## 2020-09-17 DIAGNOSIS — N2581 Secondary hyperparathyroidism of renal origin: Secondary | ICD-10-CM | POA: Diagnosis not present

## 2020-09-17 DIAGNOSIS — N186 End stage renal disease: Secondary | ICD-10-CM | POA: Diagnosis not present

## 2020-09-17 DIAGNOSIS — D509 Iron deficiency anemia, unspecified: Secondary | ICD-10-CM | POA: Diagnosis not present

## 2020-09-17 DIAGNOSIS — Z992 Dependence on renal dialysis: Secondary | ICD-10-CM | POA: Diagnosis not present

## 2020-09-17 DIAGNOSIS — D631 Anemia in chronic kidney disease: Secondary | ICD-10-CM | POA: Diagnosis not present

## 2020-09-19 DIAGNOSIS — N2581 Secondary hyperparathyroidism of renal origin: Secondary | ICD-10-CM | POA: Diagnosis not present

## 2020-09-19 DIAGNOSIS — D509 Iron deficiency anemia, unspecified: Secondary | ICD-10-CM | POA: Diagnosis not present

## 2020-09-19 DIAGNOSIS — Z992 Dependence on renal dialysis: Secondary | ICD-10-CM | POA: Diagnosis not present

## 2020-09-19 DIAGNOSIS — N186 End stage renal disease: Secondary | ICD-10-CM | POA: Diagnosis not present

## 2020-09-19 DIAGNOSIS — D631 Anemia in chronic kidney disease: Secondary | ICD-10-CM | POA: Diagnosis not present

## 2020-09-21 DIAGNOSIS — D509 Iron deficiency anemia, unspecified: Secondary | ICD-10-CM | POA: Diagnosis not present

## 2020-09-21 DIAGNOSIS — N186 End stage renal disease: Secondary | ICD-10-CM | POA: Diagnosis not present

## 2020-09-21 DIAGNOSIS — D631 Anemia in chronic kidney disease: Secondary | ICD-10-CM | POA: Diagnosis not present

## 2020-09-21 DIAGNOSIS — Z992 Dependence on renal dialysis: Secondary | ICD-10-CM | POA: Diagnosis not present

## 2020-09-21 DIAGNOSIS — N2581 Secondary hyperparathyroidism of renal origin: Secondary | ICD-10-CM | POA: Diagnosis not present

## 2020-09-24 DIAGNOSIS — Z992 Dependence on renal dialysis: Secondary | ICD-10-CM | POA: Diagnosis not present

## 2020-09-24 DIAGNOSIS — N186 End stage renal disease: Secondary | ICD-10-CM | POA: Diagnosis not present

## 2020-09-24 DIAGNOSIS — D631 Anemia in chronic kidney disease: Secondary | ICD-10-CM | POA: Diagnosis not present

## 2020-09-24 DIAGNOSIS — D509 Iron deficiency anemia, unspecified: Secondary | ICD-10-CM | POA: Diagnosis not present

## 2020-09-24 DIAGNOSIS — N2581 Secondary hyperparathyroidism of renal origin: Secondary | ICD-10-CM | POA: Diagnosis not present

## 2020-09-26 DIAGNOSIS — D631 Anemia in chronic kidney disease: Secondary | ICD-10-CM | POA: Diagnosis not present

## 2020-09-26 DIAGNOSIS — Z992 Dependence on renal dialysis: Secondary | ICD-10-CM | POA: Diagnosis not present

## 2020-09-26 DIAGNOSIS — N2581 Secondary hyperparathyroidism of renal origin: Secondary | ICD-10-CM | POA: Diagnosis not present

## 2020-09-26 DIAGNOSIS — N186 End stage renal disease: Secondary | ICD-10-CM | POA: Diagnosis not present

## 2020-09-26 DIAGNOSIS — D509 Iron deficiency anemia, unspecified: Secondary | ICD-10-CM | POA: Diagnosis not present

## 2020-09-28 DIAGNOSIS — N186 End stage renal disease: Secondary | ICD-10-CM | POA: Diagnosis not present

## 2020-09-28 DIAGNOSIS — N2581 Secondary hyperparathyroidism of renal origin: Secondary | ICD-10-CM | POA: Diagnosis not present

## 2020-09-28 DIAGNOSIS — Z992 Dependence on renal dialysis: Secondary | ICD-10-CM | POA: Diagnosis not present

## 2020-09-28 DIAGNOSIS — D631 Anemia in chronic kidney disease: Secondary | ICD-10-CM | POA: Diagnosis not present

## 2020-09-28 DIAGNOSIS — D509 Iron deficiency anemia, unspecified: Secondary | ICD-10-CM | POA: Diagnosis not present

## 2020-09-29 DIAGNOSIS — Z992 Dependence on renal dialysis: Secondary | ICD-10-CM | POA: Diagnosis not present

## 2020-09-29 DIAGNOSIS — N186 End stage renal disease: Secondary | ICD-10-CM | POA: Diagnosis not present

## 2020-09-29 DIAGNOSIS — N033 Chronic nephritic syndrome with diffuse mesangial proliferative glomerulonephritis: Secondary | ICD-10-CM | POA: Diagnosis not present

## 2020-10-01 DIAGNOSIS — N2581 Secondary hyperparathyroidism of renal origin: Secondary | ICD-10-CM | POA: Diagnosis not present

## 2020-10-01 DIAGNOSIS — Z992 Dependence on renal dialysis: Secondary | ICD-10-CM | POA: Diagnosis not present

## 2020-10-01 DIAGNOSIS — N186 End stage renal disease: Secondary | ICD-10-CM | POA: Diagnosis not present

## 2020-10-03 DIAGNOSIS — N186 End stage renal disease: Secondary | ICD-10-CM | POA: Diagnosis not present

## 2020-10-03 DIAGNOSIS — Z992 Dependence on renal dialysis: Secondary | ICD-10-CM | POA: Diagnosis not present

## 2020-10-03 DIAGNOSIS — N2581 Secondary hyperparathyroidism of renal origin: Secondary | ICD-10-CM | POA: Diagnosis not present

## 2020-10-05 DIAGNOSIS — Z992 Dependence on renal dialysis: Secondary | ICD-10-CM | POA: Diagnosis not present

## 2020-10-05 DIAGNOSIS — N186 End stage renal disease: Secondary | ICD-10-CM | POA: Diagnosis not present

## 2020-10-05 DIAGNOSIS — N2581 Secondary hyperparathyroidism of renal origin: Secondary | ICD-10-CM | POA: Diagnosis not present

## 2020-10-08 DIAGNOSIS — N186 End stage renal disease: Secondary | ICD-10-CM | POA: Diagnosis not present

## 2020-10-08 DIAGNOSIS — N2581 Secondary hyperparathyroidism of renal origin: Secondary | ICD-10-CM | POA: Diagnosis not present

## 2020-10-08 DIAGNOSIS — Z992 Dependence on renal dialysis: Secondary | ICD-10-CM | POA: Diagnosis not present

## 2020-10-10 DIAGNOSIS — N2581 Secondary hyperparathyroidism of renal origin: Secondary | ICD-10-CM | POA: Diagnosis not present

## 2020-10-10 DIAGNOSIS — Z992 Dependence on renal dialysis: Secondary | ICD-10-CM | POA: Diagnosis not present

## 2020-10-10 DIAGNOSIS — N186 End stage renal disease: Secondary | ICD-10-CM | POA: Diagnosis not present

## 2020-10-12 DIAGNOSIS — N186 End stage renal disease: Secondary | ICD-10-CM | POA: Diagnosis not present

## 2020-10-12 DIAGNOSIS — N2581 Secondary hyperparathyroidism of renal origin: Secondary | ICD-10-CM | POA: Diagnosis not present

## 2020-10-12 DIAGNOSIS — Z992 Dependence on renal dialysis: Secondary | ICD-10-CM | POA: Diagnosis not present

## 2020-10-15 DIAGNOSIS — Z992 Dependence on renal dialysis: Secondary | ICD-10-CM | POA: Diagnosis not present

## 2020-10-15 DIAGNOSIS — N2581 Secondary hyperparathyroidism of renal origin: Secondary | ICD-10-CM | POA: Diagnosis not present

## 2020-10-15 DIAGNOSIS — N186 End stage renal disease: Secondary | ICD-10-CM | POA: Diagnosis not present

## 2020-10-17 DIAGNOSIS — N186 End stage renal disease: Secondary | ICD-10-CM | POA: Diagnosis not present

## 2020-10-17 DIAGNOSIS — Z992 Dependence on renal dialysis: Secondary | ICD-10-CM | POA: Diagnosis not present

## 2020-10-17 DIAGNOSIS — N2581 Secondary hyperparathyroidism of renal origin: Secondary | ICD-10-CM | POA: Diagnosis not present

## 2020-10-19 DIAGNOSIS — Z992 Dependence on renal dialysis: Secondary | ICD-10-CM | POA: Diagnosis not present

## 2020-10-19 DIAGNOSIS — N2581 Secondary hyperparathyroidism of renal origin: Secondary | ICD-10-CM | POA: Diagnosis not present

## 2020-10-19 DIAGNOSIS — N186 End stage renal disease: Secondary | ICD-10-CM | POA: Diagnosis not present

## 2020-10-22 DIAGNOSIS — N2581 Secondary hyperparathyroidism of renal origin: Secondary | ICD-10-CM | POA: Diagnosis not present

## 2020-10-22 DIAGNOSIS — Z992 Dependence on renal dialysis: Secondary | ICD-10-CM | POA: Diagnosis not present

## 2020-10-22 DIAGNOSIS — N186 End stage renal disease: Secondary | ICD-10-CM | POA: Diagnosis not present

## 2020-10-23 DIAGNOSIS — Z6821 Body mass index (BMI) 21.0-21.9, adult: Secondary | ICD-10-CM | POA: Diagnosis not present

## 2020-10-23 DIAGNOSIS — J09X2 Influenza due to identified novel influenza A virus with other respiratory manifestations: Secondary | ICD-10-CM | POA: Diagnosis not present

## 2020-10-24 DIAGNOSIS — N2581 Secondary hyperparathyroidism of renal origin: Secondary | ICD-10-CM | POA: Diagnosis not present

## 2020-10-24 DIAGNOSIS — Z992 Dependence on renal dialysis: Secondary | ICD-10-CM | POA: Diagnosis not present

## 2020-10-24 DIAGNOSIS — N186 End stage renal disease: Secondary | ICD-10-CM | POA: Diagnosis not present

## 2020-10-26 DIAGNOSIS — Z992 Dependence on renal dialysis: Secondary | ICD-10-CM | POA: Diagnosis not present

## 2020-10-26 DIAGNOSIS — N2581 Secondary hyperparathyroidism of renal origin: Secondary | ICD-10-CM | POA: Diagnosis not present

## 2020-10-26 DIAGNOSIS — N186 End stage renal disease: Secondary | ICD-10-CM | POA: Diagnosis not present

## 2020-10-29 DIAGNOSIS — N2581 Secondary hyperparathyroidism of renal origin: Secondary | ICD-10-CM | POA: Diagnosis not present

## 2020-10-29 DIAGNOSIS — Z992 Dependence on renal dialysis: Secondary | ICD-10-CM | POA: Diagnosis not present

## 2020-10-29 DIAGNOSIS — N186 End stage renal disease: Secondary | ICD-10-CM | POA: Diagnosis not present

## 2020-10-30 DIAGNOSIS — Z992 Dependence on renal dialysis: Secondary | ICD-10-CM | POA: Diagnosis not present

## 2020-10-30 DIAGNOSIS — N186 End stage renal disease: Secondary | ICD-10-CM | POA: Diagnosis not present

## 2020-10-30 DIAGNOSIS — N033 Chronic nephritic syndrome with diffuse mesangial proliferative glomerulonephritis: Secondary | ICD-10-CM | POA: Diagnosis not present

## 2020-10-31 DIAGNOSIS — N2581 Secondary hyperparathyroidism of renal origin: Secondary | ICD-10-CM | POA: Diagnosis not present

## 2020-10-31 DIAGNOSIS — N186 End stage renal disease: Secondary | ICD-10-CM | POA: Diagnosis not present

## 2020-10-31 DIAGNOSIS — Z992 Dependence on renal dialysis: Secondary | ICD-10-CM | POA: Diagnosis not present

## 2020-11-02 DIAGNOSIS — N186 End stage renal disease: Secondary | ICD-10-CM | POA: Diagnosis not present

## 2020-11-02 DIAGNOSIS — Z992 Dependence on renal dialysis: Secondary | ICD-10-CM | POA: Diagnosis not present

## 2020-11-02 DIAGNOSIS — N2581 Secondary hyperparathyroidism of renal origin: Secondary | ICD-10-CM | POA: Diagnosis not present

## 2020-11-05 DIAGNOSIS — Z992 Dependence on renal dialysis: Secondary | ICD-10-CM | POA: Diagnosis not present

## 2020-11-05 DIAGNOSIS — N2581 Secondary hyperparathyroidism of renal origin: Secondary | ICD-10-CM | POA: Diagnosis not present

## 2020-11-05 DIAGNOSIS — N186 End stage renal disease: Secondary | ICD-10-CM | POA: Diagnosis not present

## 2020-11-07 DIAGNOSIS — N186 End stage renal disease: Secondary | ICD-10-CM | POA: Diagnosis not present

## 2020-11-07 DIAGNOSIS — Z992 Dependence on renal dialysis: Secondary | ICD-10-CM | POA: Diagnosis not present

## 2020-11-07 DIAGNOSIS — N2581 Secondary hyperparathyroidism of renal origin: Secondary | ICD-10-CM | POA: Diagnosis not present

## 2020-11-09 DIAGNOSIS — Z992 Dependence on renal dialysis: Secondary | ICD-10-CM | POA: Diagnosis not present

## 2020-11-09 DIAGNOSIS — N186 End stage renal disease: Secondary | ICD-10-CM | POA: Diagnosis not present

## 2020-11-09 DIAGNOSIS — N2581 Secondary hyperparathyroidism of renal origin: Secondary | ICD-10-CM | POA: Diagnosis not present

## 2020-11-12 DIAGNOSIS — Z992 Dependence on renal dialysis: Secondary | ICD-10-CM | POA: Diagnosis not present

## 2020-11-12 DIAGNOSIS — N2581 Secondary hyperparathyroidism of renal origin: Secondary | ICD-10-CM | POA: Diagnosis not present

## 2020-11-12 DIAGNOSIS — N186 End stage renal disease: Secondary | ICD-10-CM | POA: Diagnosis not present

## 2020-11-13 DIAGNOSIS — Z992 Dependence on renal dialysis: Secondary | ICD-10-CM | POA: Diagnosis not present

## 2020-11-13 DIAGNOSIS — N2581 Secondary hyperparathyroidism of renal origin: Secondary | ICD-10-CM | POA: Diagnosis not present

## 2020-11-13 DIAGNOSIS — N186 End stage renal disease: Secondary | ICD-10-CM | POA: Diagnosis not present

## 2020-11-16 DIAGNOSIS — Z992 Dependence on renal dialysis: Secondary | ICD-10-CM | POA: Diagnosis not present

## 2020-11-16 DIAGNOSIS — N186 End stage renal disease: Secondary | ICD-10-CM | POA: Diagnosis not present

## 2020-11-16 DIAGNOSIS — N2581 Secondary hyperparathyroidism of renal origin: Secondary | ICD-10-CM | POA: Diagnosis not present

## 2020-11-19 DIAGNOSIS — Z992 Dependence on renal dialysis: Secondary | ICD-10-CM | POA: Diagnosis not present

## 2020-11-19 DIAGNOSIS — N186 End stage renal disease: Secondary | ICD-10-CM | POA: Diagnosis not present

## 2020-11-19 DIAGNOSIS — N2581 Secondary hyperparathyroidism of renal origin: Secondary | ICD-10-CM | POA: Diagnosis not present

## 2020-11-21 DIAGNOSIS — Z992 Dependence on renal dialysis: Secondary | ICD-10-CM | POA: Diagnosis not present

## 2020-11-21 DIAGNOSIS — N186 End stage renal disease: Secondary | ICD-10-CM | POA: Diagnosis not present

## 2020-11-21 DIAGNOSIS — N2581 Secondary hyperparathyroidism of renal origin: Secondary | ICD-10-CM | POA: Diagnosis not present

## 2020-11-23 DIAGNOSIS — N186 End stage renal disease: Secondary | ICD-10-CM | POA: Diagnosis not present

## 2020-11-23 DIAGNOSIS — Z992 Dependence on renal dialysis: Secondary | ICD-10-CM | POA: Diagnosis not present

## 2020-11-23 DIAGNOSIS — N2581 Secondary hyperparathyroidism of renal origin: Secondary | ICD-10-CM | POA: Diagnosis not present

## 2020-11-26 DIAGNOSIS — Z992 Dependence on renal dialysis: Secondary | ICD-10-CM | POA: Diagnosis not present

## 2020-11-26 DIAGNOSIS — N2581 Secondary hyperparathyroidism of renal origin: Secondary | ICD-10-CM | POA: Diagnosis not present

## 2020-11-26 DIAGNOSIS — N186 End stage renal disease: Secondary | ICD-10-CM | POA: Diagnosis not present

## 2020-11-27 DIAGNOSIS — Z992 Dependence on renal dialysis: Secondary | ICD-10-CM | POA: Diagnosis not present

## 2020-11-27 DIAGNOSIS — N033 Chronic nephritic syndrome with diffuse mesangial proliferative glomerulonephritis: Secondary | ICD-10-CM | POA: Diagnosis not present

## 2020-11-27 DIAGNOSIS — N186 End stage renal disease: Secondary | ICD-10-CM | POA: Diagnosis not present

## 2020-11-28 DIAGNOSIS — N186 End stage renal disease: Secondary | ICD-10-CM | POA: Diagnosis not present

## 2020-11-28 DIAGNOSIS — Z992 Dependence on renal dialysis: Secondary | ICD-10-CM | POA: Diagnosis not present

## 2020-11-28 DIAGNOSIS — N2581 Secondary hyperparathyroidism of renal origin: Secondary | ICD-10-CM | POA: Diagnosis not present

## 2020-11-28 DIAGNOSIS — D631 Anemia in chronic kidney disease: Secondary | ICD-10-CM | POA: Diagnosis not present

## 2020-11-30 DIAGNOSIS — N2581 Secondary hyperparathyroidism of renal origin: Secondary | ICD-10-CM | POA: Diagnosis not present

## 2020-11-30 DIAGNOSIS — Z992 Dependence on renal dialysis: Secondary | ICD-10-CM | POA: Diagnosis not present

## 2020-11-30 DIAGNOSIS — N186 End stage renal disease: Secondary | ICD-10-CM | POA: Diagnosis not present

## 2020-11-30 DIAGNOSIS — D631 Anemia in chronic kidney disease: Secondary | ICD-10-CM | POA: Diagnosis not present

## 2020-12-03 DIAGNOSIS — N186 End stage renal disease: Secondary | ICD-10-CM | POA: Diagnosis not present

## 2020-12-03 DIAGNOSIS — Z992 Dependence on renal dialysis: Secondary | ICD-10-CM | POA: Diagnosis not present

## 2020-12-03 DIAGNOSIS — N2581 Secondary hyperparathyroidism of renal origin: Secondary | ICD-10-CM | POA: Diagnosis not present

## 2020-12-03 DIAGNOSIS — D631 Anemia in chronic kidney disease: Secondary | ICD-10-CM | POA: Diagnosis not present

## 2020-12-05 DIAGNOSIS — N2581 Secondary hyperparathyroidism of renal origin: Secondary | ICD-10-CM | POA: Diagnosis not present

## 2020-12-05 DIAGNOSIS — D631 Anemia in chronic kidney disease: Secondary | ICD-10-CM | POA: Diagnosis not present

## 2020-12-05 DIAGNOSIS — N186 End stage renal disease: Secondary | ICD-10-CM | POA: Diagnosis not present

## 2020-12-05 DIAGNOSIS — Z992 Dependence on renal dialysis: Secondary | ICD-10-CM | POA: Diagnosis not present

## 2020-12-07 DIAGNOSIS — D631 Anemia in chronic kidney disease: Secondary | ICD-10-CM | POA: Diagnosis not present

## 2020-12-07 DIAGNOSIS — N2581 Secondary hyperparathyroidism of renal origin: Secondary | ICD-10-CM | POA: Diagnosis not present

## 2020-12-07 DIAGNOSIS — Z992 Dependence on renal dialysis: Secondary | ICD-10-CM | POA: Diagnosis not present

## 2020-12-07 DIAGNOSIS — N186 End stage renal disease: Secondary | ICD-10-CM | POA: Diagnosis not present

## 2020-12-10 DIAGNOSIS — D631 Anemia in chronic kidney disease: Secondary | ICD-10-CM | POA: Diagnosis not present

## 2020-12-10 DIAGNOSIS — Z992 Dependence on renal dialysis: Secondary | ICD-10-CM | POA: Diagnosis not present

## 2020-12-10 DIAGNOSIS — N2581 Secondary hyperparathyroidism of renal origin: Secondary | ICD-10-CM | POA: Diagnosis not present

## 2020-12-10 DIAGNOSIS — N186 End stage renal disease: Secondary | ICD-10-CM | POA: Diagnosis not present

## 2020-12-12 DIAGNOSIS — N186 End stage renal disease: Secondary | ICD-10-CM | POA: Diagnosis not present

## 2020-12-12 DIAGNOSIS — D631 Anemia in chronic kidney disease: Secondary | ICD-10-CM | POA: Diagnosis not present

## 2020-12-12 DIAGNOSIS — N2581 Secondary hyperparathyroidism of renal origin: Secondary | ICD-10-CM | POA: Diagnosis not present

## 2020-12-12 DIAGNOSIS — Z992 Dependence on renal dialysis: Secondary | ICD-10-CM | POA: Diagnosis not present

## 2020-12-14 DIAGNOSIS — N2581 Secondary hyperparathyroidism of renal origin: Secondary | ICD-10-CM | POA: Diagnosis not present

## 2020-12-14 DIAGNOSIS — Z992 Dependence on renal dialysis: Secondary | ICD-10-CM | POA: Diagnosis not present

## 2020-12-14 DIAGNOSIS — D631 Anemia in chronic kidney disease: Secondary | ICD-10-CM | POA: Diagnosis not present

## 2020-12-14 DIAGNOSIS — N186 End stage renal disease: Secondary | ICD-10-CM | POA: Diagnosis not present

## 2020-12-18 DIAGNOSIS — N186 End stage renal disease: Secondary | ICD-10-CM | POA: Diagnosis not present

## 2020-12-18 DIAGNOSIS — N2581 Secondary hyperparathyroidism of renal origin: Secondary | ICD-10-CM | POA: Diagnosis not present

## 2020-12-18 DIAGNOSIS — D631 Anemia in chronic kidney disease: Secondary | ICD-10-CM | POA: Diagnosis not present

## 2020-12-18 DIAGNOSIS — Z992 Dependence on renal dialysis: Secondary | ICD-10-CM | POA: Diagnosis not present

## 2020-12-19 DIAGNOSIS — Z992 Dependence on renal dialysis: Secondary | ICD-10-CM | POA: Diagnosis not present

## 2020-12-19 DIAGNOSIS — D631 Anemia in chronic kidney disease: Secondary | ICD-10-CM | POA: Diagnosis not present

## 2020-12-19 DIAGNOSIS — N2581 Secondary hyperparathyroidism of renal origin: Secondary | ICD-10-CM | POA: Diagnosis not present

## 2020-12-19 DIAGNOSIS — N186 End stage renal disease: Secondary | ICD-10-CM | POA: Diagnosis not present

## 2020-12-21 DIAGNOSIS — D631 Anemia in chronic kidney disease: Secondary | ICD-10-CM | POA: Diagnosis not present

## 2020-12-21 DIAGNOSIS — Z992 Dependence on renal dialysis: Secondary | ICD-10-CM | POA: Diagnosis not present

## 2020-12-21 DIAGNOSIS — N186 End stage renal disease: Secondary | ICD-10-CM | POA: Diagnosis not present

## 2020-12-21 DIAGNOSIS — N2581 Secondary hyperparathyroidism of renal origin: Secondary | ICD-10-CM | POA: Diagnosis not present

## 2020-12-24 DIAGNOSIS — D631 Anemia in chronic kidney disease: Secondary | ICD-10-CM | POA: Diagnosis not present

## 2020-12-24 DIAGNOSIS — N2581 Secondary hyperparathyroidism of renal origin: Secondary | ICD-10-CM | POA: Diagnosis not present

## 2020-12-24 DIAGNOSIS — Z992 Dependence on renal dialysis: Secondary | ICD-10-CM | POA: Diagnosis not present

## 2020-12-24 DIAGNOSIS — N186 End stage renal disease: Secondary | ICD-10-CM | POA: Diagnosis not present

## 2020-12-26 DIAGNOSIS — D631 Anemia in chronic kidney disease: Secondary | ICD-10-CM | POA: Diagnosis not present

## 2020-12-26 DIAGNOSIS — Z992 Dependence on renal dialysis: Secondary | ICD-10-CM | POA: Diagnosis not present

## 2020-12-26 DIAGNOSIS — N2581 Secondary hyperparathyroidism of renal origin: Secondary | ICD-10-CM | POA: Diagnosis not present

## 2020-12-26 DIAGNOSIS — N186 End stage renal disease: Secondary | ICD-10-CM | POA: Diagnosis not present

## 2020-12-27 DIAGNOSIS — N186 End stage renal disease: Secondary | ICD-10-CM | POA: Diagnosis not present

## 2020-12-27 DIAGNOSIS — N2581 Secondary hyperparathyroidism of renal origin: Secondary | ICD-10-CM | POA: Diagnosis not present

## 2020-12-27 DIAGNOSIS — D631 Anemia in chronic kidney disease: Secondary | ICD-10-CM | POA: Diagnosis not present

## 2020-12-27 DIAGNOSIS — Z992 Dependence on renal dialysis: Secondary | ICD-10-CM | POA: Diagnosis not present

## 2020-12-28 DIAGNOSIS — N186 End stage renal disease: Secondary | ICD-10-CM | POA: Diagnosis not present

## 2020-12-28 DIAGNOSIS — Z992 Dependence on renal dialysis: Secondary | ICD-10-CM | POA: Diagnosis not present

## 2020-12-28 DIAGNOSIS — N033 Chronic nephritic syndrome with diffuse mesangial proliferative glomerulonephritis: Secondary | ICD-10-CM | POA: Diagnosis not present

## 2020-12-31 DIAGNOSIS — Z992 Dependence on renal dialysis: Secondary | ICD-10-CM | POA: Diagnosis not present

## 2020-12-31 DIAGNOSIS — N186 End stage renal disease: Secondary | ICD-10-CM | POA: Diagnosis not present

## 2020-12-31 DIAGNOSIS — N2581 Secondary hyperparathyroidism of renal origin: Secondary | ICD-10-CM | POA: Diagnosis not present

## 2020-12-31 DIAGNOSIS — D631 Anemia in chronic kidney disease: Secondary | ICD-10-CM | POA: Diagnosis not present

## 2021-01-02 DIAGNOSIS — N2581 Secondary hyperparathyroidism of renal origin: Secondary | ICD-10-CM | POA: Diagnosis not present

## 2021-01-02 DIAGNOSIS — D631 Anemia in chronic kidney disease: Secondary | ICD-10-CM | POA: Diagnosis not present

## 2021-01-02 DIAGNOSIS — Z992 Dependence on renal dialysis: Secondary | ICD-10-CM | POA: Diagnosis not present

## 2021-01-02 DIAGNOSIS — N186 End stage renal disease: Secondary | ICD-10-CM | POA: Diagnosis not present

## 2021-01-04 DIAGNOSIS — Z992 Dependence on renal dialysis: Secondary | ICD-10-CM | POA: Diagnosis not present

## 2021-01-04 DIAGNOSIS — N186 End stage renal disease: Secondary | ICD-10-CM | POA: Diagnosis not present

## 2021-01-04 DIAGNOSIS — D631 Anemia in chronic kidney disease: Secondary | ICD-10-CM | POA: Diagnosis not present

## 2021-01-04 DIAGNOSIS — N2581 Secondary hyperparathyroidism of renal origin: Secondary | ICD-10-CM | POA: Diagnosis not present

## 2021-01-07 DIAGNOSIS — N186 End stage renal disease: Secondary | ICD-10-CM | POA: Diagnosis not present

## 2021-01-07 DIAGNOSIS — Z992 Dependence on renal dialysis: Secondary | ICD-10-CM | POA: Diagnosis not present

## 2021-01-07 DIAGNOSIS — D631 Anemia in chronic kidney disease: Secondary | ICD-10-CM | POA: Diagnosis not present

## 2021-01-07 DIAGNOSIS — N2581 Secondary hyperparathyroidism of renal origin: Secondary | ICD-10-CM | POA: Diagnosis not present

## 2021-01-09 DIAGNOSIS — N2581 Secondary hyperparathyroidism of renal origin: Secondary | ICD-10-CM | POA: Diagnosis not present

## 2021-01-09 DIAGNOSIS — N186 End stage renal disease: Secondary | ICD-10-CM | POA: Diagnosis not present

## 2021-01-09 DIAGNOSIS — D631 Anemia in chronic kidney disease: Secondary | ICD-10-CM | POA: Diagnosis not present

## 2021-01-09 DIAGNOSIS — Z992 Dependence on renal dialysis: Secondary | ICD-10-CM | POA: Diagnosis not present

## 2021-01-11 DIAGNOSIS — D631 Anemia in chronic kidney disease: Secondary | ICD-10-CM | POA: Diagnosis not present

## 2021-01-11 DIAGNOSIS — N186 End stage renal disease: Secondary | ICD-10-CM | POA: Diagnosis not present

## 2021-01-11 DIAGNOSIS — N2581 Secondary hyperparathyroidism of renal origin: Secondary | ICD-10-CM | POA: Diagnosis not present

## 2021-01-11 DIAGNOSIS — Z992 Dependence on renal dialysis: Secondary | ICD-10-CM | POA: Diagnosis not present

## 2021-01-16 DIAGNOSIS — D631 Anemia in chronic kidney disease: Secondary | ICD-10-CM | POA: Diagnosis not present

## 2021-01-16 DIAGNOSIS — N186 End stage renal disease: Secondary | ICD-10-CM | POA: Diagnosis not present

## 2021-01-16 DIAGNOSIS — Z992 Dependence on renal dialysis: Secondary | ICD-10-CM | POA: Diagnosis not present

## 2021-01-16 DIAGNOSIS — N2581 Secondary hyperparathyroidism of renal origin: Secondary | ICD-10-CM | POA: Diagnosis not present

## 2021-01-18 DIAGNOSIS — D631 Anemia in chronic kidney disease: Secondary | ICD-10-CM | POA: Diagnosis not present

## 2021-01-18 DIAGNOSIS — Z992 Dependence on renal dialysis: Secondary | ICD-10-CM | POA: Diagnosis not present

## 2021-01-18 DIAGNOSIS — N2581 Secondary hyperparathyroidism of renal origin: Secondary | ICD-10-CM | POA: Diagnosis not present

## 2021-01-18 DIAGNOSIS — N186 End stage renal disease: Secondary | ICD-10-CM | POA: Diagnosis not present

## 2021-01-21 DIAGNOSIS — Z992 Dependence on renal dialysis: Secondary | ICD-10-CM | POA: Diagnosis not present

## 2021-01-21 DIAGNOSIS — D631 Anemia in chronic kidney disease: Secondary | ICD-10-CM | POA: Diagnosis not present

## 2021-01-21 DIAGNOSIS — N2581 Secondary hyperparathyroidism of renal origin: Secondary | ICD-10-CM | POA: Diagnosis not present

## 2021-01-21 DIAGNOSIS — N186 End stage renal disease: Secondary | ICD-10-CM | POA: Diagnosis not present

## 2021-01-23 DIAGNOSIS — N186 End stage renal disease: Secondary | ICD-10-CM | POA: Diagnosis not present

## 2021-01-23 DIAGNOSIS — D631 Anemia in chronic kidney disease: Secondary | ICD-10-CM | POA: Diagnosis not present

## 2021-01-23 DIAGNOSIS — N2581 Secondary hyperparathyroidism of renal origin: Secondary | ICD-10-CM | POA: Diagnosis not present

## 2021-01-23 DIAGNOSIS — Z992 Dependence on renal dialysis: Secondary | ICD-10-CM | POA: Diagnosis not present

## 2021-01-25 DIAGNOSIS — Z992 Dependence on renal dialysis: Secondary | ICD-10-CM | POA: Diagnosis not present

## 2021-01-25 DIAGNOSIS — N2581 Secondary hyperparathyroidism of renal origin: Secondary | ICD-10-CM | POA: Diagnosis not present

## 2021-01-25 DIAGNOSIS — N186 End stage renal disease: Secondary | ICD-10-CM | POA: Diagnosis not present

## 2021-01-25 DIAGNOSIS — D631 Anemia in chronic kidney disease: Secondary | ICD-10-CM | POA: Diagnosis not present

## 2021-01-27 DIAGNOSIS — N186 End stage renal disease: Secondary | ICD-10-CM | POA: Diagnosis not present

## 2021-01-27 DIAGNOSIS — N033 Chronic nephritic syndrome with diffuse mesangial proliferative glomerulonephritis: Secondary | ICD-10-CM | POA: Diagnosis not present

## 2021-01-27 DIAGNOSIS — Z992 Dependence on renal dialysis: Secondary | ICD-10-CM | POA: Diagnosis not present

## 2021-01-28 DIAGNOSIS — N186 End stage renal disease: Secondary | ICD-10-CM | POA: Diagnosis not present

## 2021-01-28 DIAGNOSIS — Z992 Dependence on renal dialysis: Secondary | ICD-10-CM | POA: Diagnosis not present

## 2021-01-28 DIAGNOSIS — D631 Anemia in chronic kidney disease: Secondary | ICD-10-CM | POA: Diagnosis not present

## 2021-01-28 DIAGNOSIS — N2581 Secondary hyperparathyroidism of renal origin: Secondary | ICD-10-CM | POA: Diagnosis not present

## 2021-01-30 DIAGNOSIS — N2581 Secondary hyperparathyroidism of renal origin: Secondary | ICD-10-CM | POA: Diagnosis not present

## 2021-01-30 DIAGNOSIS — D631 Anemia in chronic kidney disease: Secondary | ICD-10-CM | POA: Diagnosis not present

## 2021-01-30 DIAGNOSIS — N186 End stage renal disease: Secondary | ICD-10-CM | POA: Diagnosis not present

## 2021-01-30 DIAGNOSIS — Z992 Dependence on renal dialysis: Secondary | ICD-10-CM | POA: Diagnosis not present

## 2021-02-01 DIAGNOSIS — Z992 Dependence on renal dialysis: Secondary | ICD-10-CM | POA: Diagnosis not present

## 2021-02-01 DIAGNOSIS — N2581 Secondary hyperparathyroidism of renal origin: Secondary | ICD-10-CM | POA: Diagnosis not present

## 2021-02-01 DIAGNOSIS — D631 Anemia in chronic kidney disease: Secondary | ICD-10-CM | POA: Diagnosis not present

## 2021-02-01 DIAGNOSIS — N186 End stage renal disease: Secondary | ICD-10-CM | POA: Diagnosis not present

## 2021-02-04 DIAGNOSIS — N2581 Secondary hyperparathyroidism of renal origin: Secondary | ICD-10-CM | POA: Diagnosis not present

## 2021-02-04 DIAGNOSIS — Z992 Dependence on renal dialysis: Secondary | ICD-10-CM | POA: Diagnosis not present

## 2021-02-04 DIAGNOSIS — N186 End stage renal disease: Secondary | ICD-10-CM | POA: Diagnosis not present

## 2021-02-04 DIAGNOSIS — D631 Anemia in chronic kidney disease: Secondary | ICD-10-CM | POA: Diagnosis not present

## 2021-02-06 DIAGNOSIS — Z992 Dependence on renal dialysis: Secondary | ICD-10-CM | POA: Diagnosis not present

## 2021-02-06 DIAGNOSIS — N2581 Secondary hyperparathyroidism of renal origin: Secondary | ICD-10-CM | POA: Diagnosis not present

## 2021-02-06 DIAGNOSIS — N186 End stage renal disease: Secondary | ICD-10-CM | POA: Diagnosis not present

## 2021-02-06 DIAGNOSIS — D631 Anemia in chronic kidney disease: Secondary | ICD-10-CM | POA: Diagnosis not present

## 2021-02-08 DIAGNOSIS — Z992 Dependence on renal dialysis: Secondary | ICD-10-CM | POA: Diagnosis not present

## 2021-02-08 DIAGNOSIS — N2581 Secondary hyperparathyroidism of renal origin: Secondary | ICD-10-CM | POA: Diagnosis not present

## 2021-02-08 DIAGNOSIS — D631 Anemia in chronic kidney disease: Secondary | ICD-10-CM | POA: Diagnosis not present

## 2021-02-08 DIAGNOSIS — N186 End stage renal disease: Secondary | ICD-10-CM | POA: Diagnosis not present

## 2021-02-11 DIAGNOSIS — Z992 Dependence on renal dialysis: Secondary | ICD-10-CM | POA: Diagnosis not present

## 2021-02-11 DIAGNOSIS — N2581 Secondary hyperparathyroidism of renal origin: Secondary | ICD-10-CM | POA: Diagnosis not present

## 2021-02-11 DIAGNOSIS — N186 End stage renal disease: Secondary | ICD-10-CM | POA: Diagnosis not present

## 2021-02-11 DIAGNOSIS — D631 Anemia in chronic kidney disease: Secondary | ICD-10-CM | POA: Diagnosis not present

## 2021-02-13 DIAGNOSIS — N2581 Secondary hyperparathyroidism of renal origin: Secondary | ICD-10-CM | POA: Diagnosis not present

## 2021-02-13 DIAGNOSIS — Z992 Dependence on renal dialysis: Secondary | ICD-10-CM | POA: Diagnosis not present

## 2021-02-13 DIAGNOSIS — D631 Anemia in chronic kidney disease: Secondary | ICD-10-CM | POA: Diagnosis not present

## 2021-02-13 DIAGNOSIS — N186 End stage renal disease: Secondary | ICD-10-CM | POA: Diagnosis not present

## 2021-02-15 DIAGNOSIS — N186 End stage renal disease: Secondary | ICD-10-CM | POA: Diagnosis not present

## 2021-02-15 DIAGNOSIS — Z992 Dependence on renal dialysis: Secondary | ICD-10-CM | POA: Diagnosis not present

## 2021-02-15 DIAGNOSIS — N2581 Secondary hyperparathyroidism of renal origin: Secondary | ICD-10-CM | POA: Diagnosis not present

## 2021-02-15 DIAGNOSIS — D631 Anemia in chronic kidney disease: Secondary | ICD-10-CM | POA: Diagnosis not present

## 2021-02-18 DIAGNOSIS — N186 End stage renal disease: Secondary | ICD-10-CM | POA: Diagnosis not present

## 2021-02-18 DIAGNOSIS — Z992 Dependence on renal dialysis: Secondary | ICD-10-CM | POA: Diagnosis not present

## 2021-02-18 DIAGNOSIS — D631 Anemia in chronic kidney disease: Secondary | ICD-10-CM | POA: Diagnosis not present

## 2021-02-18 DIAGNOSIS — N2581 Secondary hyperparathyroidism of renal origin: Secondary | ICD-10-CM | POA: Diagnosis not present

## 2021-02-20 DIAGNOSIS — D631 Anemia in chronic kidney disease: Secondary | ICD-10-CM | POA: Diagnosis not present

## 2021-02-20 DIAGNOSIS — N2581 Secondary hyperparathyroidism of renal origin: Secondary | ICD-10-CM | POA: Diagnosis not present

## 2021-02-20 DIAGNOSIS — Z992 Dependence on renal dialysis: Secondary | ICD-10-CM | POA: Diagnosis not present

## 2021-02-20 DIAGNOSIS — N186 End stage renal disease: Secondary | ICD-10-CM | POA: Diagnosis not present

## 2021-02-22 DIAGNOSIS — D631 Anemia in chronic kidney disease: Secondary | ICD-10-CM | POA: Diagnosis not present

## 2021-02-22 DIAGNOSIS — N186 End stage renal disease: Secondary | ICD-10-CM | POA: Diagnosis not present

## 2021-02-22 DIAGNOSIS — N2581 Secondary hyperparathyroidism of renal origin: Secondary | ICD-10-CM | POA: Diagnosis not present

## 2021-02-22 DIAGNOSIS — Z992 Dependence on renal dialysis: Secondary | ICD-10-CM | POA: Diagnosis not present

## 2021-02-25 DIAGNOSIS — N2581 Secondary hyperparathyroidism of renal origin: Secondary | ICD-10-CM | POA: Diagnosis not present

## 2021-02-25 DIAGNOSIS — D631 Anemia in chronic kidney disease: Secondary | ICD-10-CM | POA: Diagnosis not present

## 2021-02-25 DIAGNOSIS — N186 End stage renal disease: Secondary | ICD-10-CM | POA: Diagnosis not present

## 2021-02-25 DIAGNOSIS — Z992 Dependence on renal dialysis: Secondary | ICD-10-CM | POA: Diagnosis not present

## 2021-02-27 DIAGNOSIS — Z992 Dependence on renal dialysis: Secondary | ICD-10-CM | POA: Diagnosis not present

## 2021-02-27 DIAGNOSIS — N186 End stage renal disease: Secondary | ICD-10-CM | POA: Diagnosis not present

## 2021-02-27 DIAGNOSIS — N2581 Secondary hyperparathyroidism of renal origin: Secondary | ICD-10-CM | POA: Diagnosis not present

## 2021-02-27 DIAGNOSIS — N033 Chronic nephritic syndrome with diffuse mesangial proliferative glomerulonephritis: Secondary | ICD-10-CM | POA: Diagnosis not present

## 2021-02-27 DIAGNOSIS — D631 Anemia in chronic kidney disease: Secondary | ICD-10-CM | POA: Diagnosis not present

## 2021-03-01 DIAGNOSIS — Z992 Dependence on renal dialysis: Secondary | ICD-10-CM | POA: Diagnosis not present

## 2021-03-01 DIAGNOSIS — D631 Anemia in chronic kidney disease: Secondary | ICD-10-CM | POA: Diagnosis not present

## 2021-03-01 DIAGNOSIS — N2581 Secondary hyperparathyroidism of renal origin: Secondary | ICD-10-CM | POA: Diagnosis not present

## 2021-03-01 DIAGNOSIS — N186 End stage renal disease: Secondary | ICD-10-CM | POA: Diagnosis not present

## 2021-03-04 DIAGNOSIS — Z992 Dependence on renal dialysis: Secondary | ICD-10-CM | POA: Diagnosis not present

## 2021-03-04 DIAGNOSIS — N2581 Secondary hyperparathyroidism of renal origin: Secondary | ICD-10-CM | POA: Diagnosis not present

## 2021-03-04 DIAGNOSIS — N186 End stage renal disease: Secondary | ICD-10-CM | POA: Diagnosis not present

## 2021-03-04 DIAGNOSIS — D631 Anemia in chronic kidney disease: Secondary | ICD-10-CM | POA: Diagnosis not present

## 2021-03-06 DIAGNOSIS — N186 End stage renal disease: Secondary | ICD-10-CM | POA: Diagnosis not present

## 2021-03-06 DIAGNOSIS — N2581 Secondary hyperparathyroidism of renal origin: Secondary | ICD-10-CM | POA: Diagnosis not present

## 2021-03-06 DIAGNOSIS — D631 Anemia in chronic kidney disease: Secondary | ICD-10-CM | POA: Diagnosis not present

## 2021-03-06 DIAGNOSIS — Z992 Dependence on renal dialysis: Secondary | ICD-10-CM | POA: Diagnosis not present

## 2021-03-08 DIAGNOSIS — Z992 Dependence on renal dialysis: Secondary | ICD-10-CM | POA: Diagnosis not present

## 2021-03-08 DIAGNOSIS — N186 End stage renal disease: Secondary | ICD-10-CM | POA: Diagnosis not present

## 2021-03-08 DIAGNOSIS — D631 Anemia in chronic kidney disease: Secondary | ICD-10-CM | POA: Diagnosis not present

## 2021-03-08 DIAGNOSIS — N2581 Secondary hyperparathyroidism of renal origin: Secondary | ICD-10-CM | POA: Diagnosis not present

## 2021-03-11 DIAGNOSIS — N186 End stage renal disease: Secondary | ICD-10-CM | POA: Diagnosis not present

## 2021-03-11 DIAGNOSIS — D631 Anemia in chronic kidney disease: Secondary | ICD-10-CM | POA: Diagnosis not present

## 2021-03-11 DIAGNOSIS — Z992 Dependence on renal dialysis: Secondary | ICD-10-CM | POA: Diagnosis not present

## 2021-03-11 DIAGNOSIS — N2581 Secondary hyperparathyroidism of renal origin: Secondary | ICD-10-CM | POA: Diagnosis not present

## 2021-03-13 DIAGNOSIS — N186 End stage renal disease: Secondary | ICD-10-CM | POA: Diagnosis not present

## 2021-03-13 DIAGNOSIS — N2581 Secondary hyperparathyroidism of renal origin: Secondary | ICD-10-CM | POA: Diagnosis not present

## 2021-03-13 DIAGNOSIS — D631 Anemia in chronic kidney disease: Secondary | ICD-10-CM | POA: Diagnosis not present

## 2021-03-13 DIAGNOSIS — Z992 Dependence on renal dialysis: Secondary | ICD-10-CM | POA: Diagnosis not present

## 2021-03-15 DIAGNOSIS — N186 End stage renal disease: Secondary | ICD-10-CM | POA: Diagnosis not present

## 2021-03-15 DIAGNOSIS — N2581 Secondary hyperparathyroidism of renal origin: Secondary | ICD-10-CM | POA: Diagnosis not present

## 2021-03-15 DIAGNOSIS — D631 Anemia in chronic kidney disease: Secondary | ICD-10-CM | POA: Diagnosis not present

## 2021-03-15 DIAGNOSIS — Z992 Dependence on renal dialysis: Secondary | ICD-10-CM | POA: Diagnosis not present

## 2021-03-18 DIAGNOSIS — N2581 Secondary hyperparathyroidism of renal origin: Secondary | ICD-10-CM | POA: Diagnosis not present

## 2021-03-18 DIAGNOSIS — N186 End stage renal disease: Secondary | ICD-10-CM | POA: Diagnosis not present

## 2021-03-18 DIAGNOSIS — Z992 Dependence on renal dialysis: Secondary | ICD-10-CM | POA: Diagnosis not present

## 2021-03-18 DIAGNOSIS — D631 Anemia in chronic kidney disease: Secondary | ICD-10-CM | POA: Diagnosis not present

## 2021-03-20 DIAGNOSIS — N2581 Secondary hyperparathyroidism of renal origin: Secondary | ICD-10-CM | POA: Diagnosis not present

## 2021-03-20 DIAGNOSIS — Z992 Dependence on renal dialysis: Secondary | ICD-10-CM | POA: Diagnosis not present

## 2021-03-20 DIAGNOSIS — D631 Anemia in chronic kidney disease: Secondary | ICD-10-CM | POA: Diagnosis not present

## 2021-03-20 DIAGNOSIS — N186 End stage renal disease: Secondary | ICD-10-CM | POA: Diagnosis not present

## 2021-03-22 DIAGNOSIS — N186 End stage renal disease: Secondary | ICD-10-CM | POA: Diagnosis not present

## 2021-03-22 DIAGNOSIS — Z992 Dependence on renal dialysis: Secondary | ICD-10-CM | POA: Diagnosis not present

## 2021-03-22 DIAGNOSIS — N2581 Secondary hyperparathyroidism of renal origin: Secondary | ICD-10-CM | POA: Diagnosis not present

## 2021-03-22 DIAGNOSIS — D631 Anemia in chronic kidney disease: Secondary | ICD-10-CM | POA: Diagnosis not present

## 2021-03-25 DIAGNOSIS — Z992 Dependence on renal dialysis: Secondary | ICD-10-CM | POA: Diagnosis not present

## 2021-03-25 DIAGNOSIS — D631 Anemia in chronic kidney disease: Secondary | ICD-10-CM | POA: Diagnosis not present

## 2021-03-25 DIAGNOSIS — N2581 Secondary hyperparathyroidism of renal origin: Secondary | ICD-10-CM | POA: Diagnosis not present

## 2021-03-25 DIAGNOSIS — N186 End stage renal disease: Secondary | ICD-10-CM | POA: Diagnosis not present

## 2021-03-27 DIAGNOSIS — D631 Anemia in chronic kidney disease: Secondary | ICD-10-CM | POA: Diagnosis not present

## 2021-03-27 DIAGNOSIS — Z992 Dependence on renal dialysis: Secondary | ICD-10-CM | POA: Diagnosis not present

## 2021-03-27 DIAGNOSIS — N2581 Secondary hyperparathyroidism of renal origin: Secondary | ICD-10-CM | POA: Diagnosis not present

## 2021-03-27 DIAGNOSIS — N186 End stage renal disease: Secondary | ICD-10-CM | POA: Diagnosis not present

## 2021-03-29 DIAGNOSIS — Z992 Dependence on renal dialysis: Secondary | ICD-10-CM | POA: Diagnosis not present

## 2021-03-29 DIAGNOSIS — N2581 Secondary hyperparathyroidism of renal origin: Secondary | ICD-10-CM | POA: Diagnosis not present

## 2021-03-29 DIAGNOSIS — D631 Anemia in chronic kidney disease: Secondary | ICD-10-CM | POA: Diagnosis not present

## 2021-03-29 DIAGNOSIS — N186 End stage renal disease: Secondary | ICD-10-CM | POA: Diagnosis not present

## 2021-03-29 DIAGNOSIS — N033 Chronic nephritic syndrome with diffuse mesangial proliferative glomerulonephritis: Secondary | ICD-10-CM | POA: Diagnosis not present

## 2021-04-01 DIAGNOSIS — N2581 Secondary hyperparathyroidism of renal origin: Secondary | ICD-10-CM | POA: Diagnosis not present

## 2021-04-01 DIAGNOSIS — Z992 Dependence on renal dialysis: Secondary | ICD-10-CM | POA: Diagnosis not present

## 2021-04-01 DIAGNOSIS — D631 Anemia in chronic kidney disease: Secondary | ICD-10-CM | POA: Diagnosis not present

## 2021-04-01 DIAGNOSIS — N186 End stage renal disease: Secondary | ICD-10-CM | POA: Diagnosis not present

## 2021-04-02 ENCOUNTER — Other Ambulatory Visit: Payer: Self-pay

## 2021-04-02 ENCOUNTER — Encounter: Payer: Self-pay | Admitting: Physician Assistant

## 2021-04-02 ENCOUNTER — Ambulatory Visit (INDEPENDENT_AMBULATORY_CARE_PROVIDER_SITE_OTHER): Payer: Medicare Other | Admitting: Physician Assistant

## 2021-04-02 VITALS — BP 98/62 | HR 96 | Resp 16 | Wt 153.9 lb

## 2021-04-02 DIAGNOSIS — N186 End stage renal disease: Secondary | ICD-10-CM | POA: Diagnosis not present

## 2021-04-02 DIAGNOSIS — Z20822 Contact with and (suspected) exposure to covid-19: Secondary | ICD-10-CM | POA: Diagnosis not present

## 2021-04-02 DIAGNOSIS — Z992 Dependence on renal dialysis: Secondary | ICD-10-CM

## 2021-04-02 NOTE — Progress Notes (Signed)
[facility] HISTORY AND PHYSICAL   History of Present Illness:  Patient is a 35 y.o. year old male who presents for examination of his fistula with report of thinning skin and ulcer over a distal section of the left forearm fistula.  He had a previous Revision of left arm AV fistula with plication of pseudoaneurysm by Dr. Donzetta Matters on 07/15/2018.    He states they do repeated sticks around the same two sites.  He denise prolonged bleeding, but he has had some drainage from the ulcer area over the last few days.  He denise fever and chills.  He has HD MWF.    Past Medical History:  Diagnosis Date   Acute on chronic renal insufficiency 07/15/2013   Anemia    Edema of lower extremity 08/06/2011   Enteritis 07/15/2013   ESRD (end stage renal disease) on dialysis [facility])    "Jeneen Rinks; MWF" (07/15/2018)   Headache    Hypertension 08/09/2011   Nephrotic syndrome    Scrotal edema 08/06/2011   Syncope 08/03/2013   UTI (urinary tract infection)     Past Surgical History:  Procedure Laterality Date   AV FISTULA PLACEMENT Left 07/18/2013   Procedure: ARTERIOVENOUS (AV) FISTULA CREATION ;  Surgeon: Rosetta Posner, MD;  Location: [place];  Service: Vascular;  Laterality: Left;   INSERTION OF DIALYSIS CATHETER Right 07/18/2013   Procedure: INSERTION OF DIALYSIS CATHETER;  Surgeon: Rosetta Posner, MD;  Location: [place];  Service: Vascular;  Laterality: Right;   MASS EXCISION Left 07/26/2019   Procedure: EPIDERMOID CYST REMOVAL OF LEFT EAR;  Surgeon: Izora Gala, MD;  Location: [place];  Service: ENT;  Laterality: Left;   RENAL BIOPSY     REVISON OF ARTERIOVENOUS FISTULA Left 07/15/2018   FOREARM   REVISON OF ARTERIOVENOUS FISTULA Left 07/15/2018   Procedure: REVISION OF ARTERIOVENOUS FISTULA LEFT FOREARM;  Surgeon: Waynetta Sandy, MD;  Location: [place];  Service: Vascular;  Laterality: Left;     Social History Social History   Tobacco Use    Smoking status: Never    Passive exposure: Yes   Smokeless tobacco: Never  Vaping Use   Vaping Use: Never used  Substance Use Topics   Alcohol use: Not Currently    Alcohol/week: 1.0 standard drink    Types: 1 Shots of liquor per week   Drug use: No    Family History Family History  Problem Relation Age of Onset   Hypertension Mother     Allergies  No Known Allergies   Current Outpatient Medications  Medication Sig Dispense Refill   FOSRENOL 1000 MG PACK Take 1,000 mg by mouth 2 (two) times daily with a meal.      acetaminophen (TYLENOL) 500 MG tablet Take 500 mg by mouth every 6 (six) hours as needed. (Patient not taking: Reported on 04/02/2021)     cephALEXin (KEFLEX) 500 MG capsule Take 1 capsule (500 mg total) by mouth 2 (two) times daily. (Patient not taking: Reported on 04/02/2021) 20 capsule 0   No current facility-administered medications for this visit.    ROS:   General:  No weight loss, Fever, chills  HEENT: No recent headaches, no nasal bleeding, no visual changes, no sore throat  Neurologic: No dizziness, blackouts, seizures. No recent symptoms of stroke or mini- stroke. No recent episodes of slurred speech, or temporary blindness.  Cardiac: No recent episodes of chest pain/pressure, no shortness of breath at rest.  No  shortness of breath with exertion.  Denies history of atrial fibrillation or irregular heartbeat  Vascular: No history of rest pain in feet.  No history of claudication.  No history of non-healing ulcer, No history of DVT   Pulmonary: No home oxygen, no productive cough, no hemoptysis,  No asthma or wheezing  Musculoskeletal:  '[ ]'$  Arthritis, '[ ]'$  Low back pain,  '[ ]'$  Joint pain  Hematologic:No history of hypercoagulable state.  No history of easy bleeding.  No history of anemia  Gastrointestinal: No hematochezia or melena,  No gastroesophageal reflux, no trouble swallowing  Urinary: '[ ]'$  chronic Kidney disease, [ x] on HD - [ x] MWF or '[ ]'$   TTHS, '[ ]'$  Burning with urination, '[ ]'$  Frequent urination, '[ ]'$  Difficulty urinating;   Skin: No rashes  Psychological: No history of anxiety,  No history of depression   Physical Examination  Vitals:   04/02/21 1043  BP: 98/62  Pulse: 96  Resp: 16  Weight: 153 lb 14.4 oz (69.8 kg)    Body mass index is 20.87 kg/m.  General:  Alert and oriented, no acute distress HEENT: Normal Neck: No bruit or JVD Pulmonary: Clear to auscultation bilaterally Cardiac: Regular Rate and Rhythm without murmur Gastrointestinal: Soft, non-tender, non-distended, no mass, no scars Skin: No rash  Palpable thrill in fistula.  Aneurysmal area with thinned skin and ulcer. Extremity Pulses:  2+ radial, brachial pulses bilaterally Musculoskeletal: No deformity or edema  Neurologic: Upper and lower extremity motor 5/5 and symmetric  ASSESSMENT/Plan: ESRD He has a thinned area of skin with ulcer and aneurysmal fistula.  The area of skin is adhered to the fistula.  We recommend plication of the fistula and possible TDC placement.  The fistula is very tortuous and it appears they have been sticking in the same area for a long time.  He wants to avoid a TDC if possible.  He is not on anticoagulation and has HD MWF.    Dr. Carlis Abbott saw this patient in conjunction today.     Roxy Horseman PA-C [facility] [place] Office: [phone]  MD in clinic [place]

## 2021-04-03 DIAGNOSIS — N186 End stage renal disease: Secondary | ICD-10-CM | POA: Diagnosis not present

## 2021-04-03 DIAGNOSIS — N2581 Secondary hyperparathyroidism of renal origin: Secondary | ICD-10-CM | POA: Diagnosis not present

## 2021-04-03 DIAGNOSIS — Z992 Dependence on renal dialysis: Secondary | ICD-10-CM | POA: Diagnosis not present

## 2021-04-03 DIAGNOSIS — D631 Anemia in chronic kidney disease: Secondary | ICD-10-CM | POA: Diagnosis not present

## 2021-04-04 DIAGNOSIS — N186 End stage renal disease: Secondary | ICD-10-CM | POA: Diagnosis not present

## 2021-04-04 DIAGNOSIS — Z992 Dependence on renal dialysis: Secondary | ICD-10-CM | POA: Diagnosis not present

## 2021-04-04 DIAGNOSIS — N2581 Secondary hyperparathyroidism of renal origin: Secondary | ICD-10-CM | POA: Diagnosis not present

## 2021-04-08 DIAGNOSIS — N2581 Secondary hyperparathyroidism of renal origin: Secondary | ICD-10-CM | POA: Diagnosis not present

## 2021-04-08 DIAGNOSIS — D631 Anemia in chronic kidney disease: Secondary | ICD-10-CM | POA: Diagnosis not present

## 2021-04-08 DIAGNOSIS — N186 End stage renal disease: Secondary | ICD-10-CM | POA: Diagnosis not present

## 2021-04-08 DIAGNOSIS — Z992 Dependence on renal dialysis: Secondary | ICD-10-CM | POA: Diagnosis not present

## 2021-04-10 DIAGNOSIS — N186 End stage renal disease: Secondary | ICD-10-CM | POA: Diagnosis not present

## 2021-04-10 DIAGNOSIS — D631 Anemia in chronic kidney disease: Secondary | ICD-10-CM | POA: Diagnosis not present

## 2021-04-10 DIAGNOSIS — Z992 Dependence on renal dialysis: Secondary | ICD-10-CM | POA: Diagnosis not present

## 2021-04-10 DIAGNOSIS — N2581 Secondary hyperparathyroidism of renal origin: Secondary | ICD-10-CM | POA: Diagnosis not present

## 2021-04-12 ENCOUNTER — Encounter (HOSPITAL_COMMUNITY): Payer: Self-pay | Admitting: Vascular Surgery

## 2021-04-12 ENCOUNTER — Other Ambulatory Visit: Payer: Self-pay

## 2021-04-12 DIAGNOSIS — N2581 Secondary hyperparathyroidism of renal origin: Secondary | ICD-10-CM | POA: Diagnosis not present

## 2021-04-12 DIAGNOSIS — Z992 Dependence on renal dialysis: Secondary | ICD-10-CM | POA: Diagnosis not present

## 2021-04-12 DIAGNOSIS — N186 End stage renal disease: Secondary | ICD-10-CM | POA: Diagnosis not present

## 2021-04-12 DIAGNOSIS — D631 Anemia in chronic kidney disease: Secondary | ICD-10-CM | POA: Diagnosis not present

## 2021-04-12 NOTE — Progress Notes (Signed)
SDW CALL  Patient was given pre-op instructions over the phone. The opportunity was given for the patient to ask questions. No further questions asked. Patient verbalized understanding of instructions given.   PCP - no PCP Cardiologist - saw Evelina Bucy, PA for cardiac workup for transplant evalutation  PPM/ICD -  n/a Device Orders -  Rep Notified -   Chest x-ray -  EKG - 11/08/20[facility] - requested tracing.  Will get EKG DOS if tracing not received Stress Test - 01/03/21 ECHO - 01/03/21 Cardiac Cath - patient denies   Sleep Study - patient denies CPAP -   Fasting Blood Sugar - n/a Checks Blood Sugar _____ times a day  Blood Thinner Instructions: n/a Aspirin Instructions: n/a  ERAS Protcol - NPO after midnight PRE-SURGERY Ensure or G2-   COVID TEST- ambulatory   Anesthesia review: recent echo stress  Patient denies shortness of breath, fever, cough and chest pain over the phone call   All instructions explained to the patient, with a verbal understanding of the material. Patient agrees to go over the instructions while at home for a better understanding. Patient also instructed to self quarantine after being tested for COVID-19. The opportunity to ask questions was provided.

## 2021-04-15 ENCOUNTER — Encounter (HOSPITAL_COMMUNITY): Payer: Self-pay | Admitting: Certified Registered Nurse Anesthetist

## 2021-04-15 ENCOUNTER — Telehealth: Payer: Self-pay

## 2021-04-15 DIAGNOSIS — Z992 Dependence on renal dialysis: Secondary | ICD-10-CM | POA: Diagnosis not present

## 2021-04-15 DIAGNOSIS — N2581 Secondary hyperparathyroidism of renal origin: Secondary | ICD-10-CM | POA: Diagnosis not present

## 2021-04-15 DIAGNOSIS — N186 End stage renal disease: Secondary | ICD-10-CM | POA: Diagnosis not present

## 2021-04-15 DIAGNOSIS — D631 Anemia in chronic kidney disease: Secondary | ICD-10-CM | POA: Diagnosis not present

## 2021-04-15 NOTE — Anesthesia Preprocedure Evaluation (Deleted)
Anesthesia Evaluation    Reviewed: Allergy & Precautions, Patient's Chart, lab work & pertinent test results  History of Anesthesia Complications Negative for: history of anesthetic complications  Airway        Dental   Pulmonary neg pulmonary ROS,           Cardiovascular hypertension,      Neuro/Psych  Headaches, negative psych ROS   GI/Hepatic negative GI ROS, Neg liver ROS,   Endo/Other  negative endocrine ROS  Renal/GU ESRF and DialysisRenal disease     Musculoskeletal negative musculoskeletal ROS (+)   Abdominal   Peds  Hematology negative hematology ROS (+)   Anesthesia Other Findings   Reproductive/Obstetrics                            Anesthesia Physical Anesthesia Plan  ASA: 3  Anesthesia Plan: MAC   Post-op Pain Management:    Induction:   PONV Risk Score and Plan: 1 and Propofol infusion and Treatment may vary due to age or medical condition  Airway Management Planned: Natural Airway and Simple Face Mask  Additional Equipment: None  Intra-op Plan:   Post-operative Plan:   Informed Consent:   Plan Discussed with: CRNA and Anesthesiologist  Anesthesia Plan Comments:        Anesthesia Quick Evaluation

## 2021-04-15 NOTE — Telephone Encounter (Signed)
Pt called requesting to reschedule left AVF plication and possible [facility] surgery scheduled for tomorrow. Surgery rescheduled for 04/23/21. Pt voiced understanding.

## 2021-04-15 NOTE — Progress Notes (Signed)
Anesthesia Chart Review: SAME DAY WORK-UP  Case: [id number] Date/Time: 04/16/21 0853   Procedures:      LEFT FOREARM ARTERIOVENOUS FISTULA PLICATION OF ULCERATED ARM (Left)     INSERTION OF DIALYSIS CATHETER   Anesthesia type: Choice   Pre-op diagnosis: ESRD   Location: [facility] OR ROOM 11 / [facility]   Surgeons: Waynetta Sandy, MD       DISCUSSION: Patient is a 35 year old male scheduled for the above procedure. He has an area of thinned skin with ulceration over aneurysmal portion of LUE AVF, and the above procedure recommended.   History includes never smoker, HTN, anemia, ESRD (presented 07/2011 with scrotal and LE edema and diagnosed with nephrotic syndrome, renal biopsy + collapsing variant of focal segmental glomerulosclerosis; started HD 06/2013; on HD MWF), syncope (2014).  In April 2022, he had a negative stress echo at [facility] as part of a renal transplant work-up, although notes in June suggest he will need to be fully vaccinated with a COVID-19 vaccine before presentation to their selection committee.  Anesthesia team to evaluate on the day of surgery.   VS:  BP Readings from Last 3 Encounters:  04/02/21 98/62  11/15/19 104/70  07/26/19 111/74   Pulse Readings from Last 3 Encounters:  04/02/21 96  11/15/19 87  07/26/19 89     PROVIDERS: Patient, No Pcp Per (Inactive)   LABS: For ISTAT day of surgery. As of 11/08/20 ([facility]), glucose 77, Cr 11.33, K 4.6, AST 14, ALT 21, A1c 4.9%, H/H 11.7/36.9, PLT 235K.   IMAGES: CXR 11/08/20 ([facility]): FINDINGS:  Cardiovascular: Cardiac silhouette and pulmonary vasculature are within normal limits.  Mediastinum: Within normal limits.  Lungs/pleura: Clear.  Upper abdomen: Visualized portions are unremarkable.  Chest wall/osseous structures: Unremarkable. IMPRESSION:  There is no evidence of acute cardiac or pulmonary abnormality.   EKG: 11/08/20 ([facility]):  Sinus rhythm ST elevation, consider early  repolarization Otherwise normal When compared with ECG of 8/17//15 12:08, no significant change was found Confirmed by Howell Rucks on 11/08/20 4:36:21 PM   CV: Stress Echo 01/03/21 ([facility]): SUMMARY  The patient had no chest pain.  The patient achieved 86 % of maximum predicted heart rate.  The METS achieved was 9.9.  Exercise capacity was fair.  Negative stress ECG for inducible ischemia at target heart rate.  There were no segmental wall motion abnormalities at rest  Normal left ventricular function and global wall motion with stress.  There was normal increase in global LV function post exercise  Negative exercise echocardiography for inducible ischemia at target  heart rate.  No significant change compared to prior study on 06/09/2014   Echo 01/03/21 ([facility]): SUMMARY  The left ventricular size is normal.  There is normal left ventricular wall thickness.  Left ventricular systolic function is low normal.  LV ejection fraction = 50-55%.  Left ventricular filling pattern is normal.  The left ventricular wall motion is normal.  The right ventricle is normal in size and function.  There is no significant valvular stenosis or regurgitation.  No pulmonary hypertension.  There is no pericardial effusion.  The IVC is normal in size with an inspiratory collapse of greater then  50%, suggesting normal right atrial pressure.  Probably no significant change in comparison with the prior study  noted   Carotid US 01/03/21 ([facility]): Result not viewable in [place]. Done as part of renal transplant evaluation.   Past Medical History:  Diagnosis Date   Acute on chronic renal insufficiency 07/15/2013   Anemia    Edema of lower extremity 08/06/2011   Enteritis 07/15/2013   ESRD (end stage renal disease) on dialysis [facility])    "Jeneen Rinks; MWF" (07/15/2018)   Headache    Hypertension 08/09/2011   Nephrotic syndrome    Scrotal edema 08/06/2011   Syncope 08/03/2013    UTI (urinary tract infection)     Past Surgical History:  Procedure Laterality Date   AV FISTULA PLACEMENT Left 07/18/2013   Procedure: ARTERIOVENOUS (AV) FISTULA CREATION ;  Surgeon: Rosetta Posner, MD;  Location: [place];  Service: Vascular;  Laterality: Left;   INSERTION OF DIALYSIS CATHETER Right 07/18/2013   Procedure: INSERTION OF DIALYSIS CATHETER;  Surgeon: Rosetta Posner, MD;  Location: [place];  Service: Vascular;  Laterality: Right;   MASS EXCISION Left 07/26/2019   Procedure: EPIDERMOID CYST REMOVAL OF LEFT EAR;  Surgeon: Izora Gala, MD;  Location: [place];  Service: ENT;  Laterality: Left;   RENAL BIOPSY     REVISON OF ARTERIOVENOUS FISTULA Left 07/15/2018   FOREARM   REVISON OF ARTERIOVENOUS FISTULA Left 07/15/2018   Procedure: REVISION OF ARTERIOVENOUS FISTULA LEFT FOREARM;  Surgeon: Waynetta Sandy, MD;  Location: [place];  Service: Vascular;  Laterality: Left;    MEDICATIONS: No current facility-administered medications for this encounter.    FOSRENOL 1000 MG PACK    Myra Gianotti, PA-C Surgical Short Stay/Anesthesiology [facility] Phone [phone] [facility] Phone [phone] 04/15/2021 12:24 PM

## 2021-04-17 DIAGNOSIS — N186 End stage renal disease: Secondary | ICD-10-CM | POA: Diagnosis not present

## 2021-04-17 DIAGNOSIS — Z992 Dependence on renal dialysis: Secondary | ICD-10-CM | POA: Diagnosis not present

## 2021-04-17 DIAGNOSIS — N2581 Secondary hyperparathyroidism of renal origin: Secondary | ICD-10-CM | POA: Diagnosis not present

## 2021-04-17 DIAGNOSIS — D631 Anemia in chronic kidney disease: Secondary | ICD-10-CM | POA: Diagnosis not present

## 2021-04-19 DIAGNOSIS — N2581 Secondary hyperparathyroidism of renal origin: Secondary | ICD-10-CM | POA: Diagnosis not present

## 2021-04-19 DIAGNOSIS — Z992 Dependence on renal dialysis: Secondary | ICD-10-CM | POA: Diagnosis not present

## 2021-04-19 DIAGNOSIS — D631 Anemia in chronic kidney disease: Secondary | ICD-10-CM | POA: Diagnosis not present

## 2021-04-19 DIAGNOSIS — N186 End stage renal disease: Secondary | ICD-10-CM | POA: Diagnosis not present

## 2021-04-20 NOTE — Progress Notes (Signed)
Notified pt. Of surgery date. Stated he already knew it was rescheduled. Stated he may not have the surgery and will call the office Monday. Pt. Didn't want instructions and ended the call.

## 2021-04-22 DIAGNOSIS — Z992 Dependence on renal dialysis: Secondary | ICD-10-CM | POA: Diagnosis not present

## 2021-04-22 DIAGNOSIS — N186 End stage renal disease: Secondary | ICD-10-CM | POA: Diagnosis not present

## 2021-04-22 DIAGNOSIS — D631 Anemia in chronic kidney disease: Secondary | ICD-10-CM | POA: Diagnosis not present

## 2021-04-22 DIAGNOSIS — N2581 Secondary hyperparathyroidism of renal origin: Secondary | ICD-10-CM | POA: Diagnosis not present

## 2021-04-22 NOTE — Telephone Encounter (Signed)
Pt called requesting to reschedule on tomorrow to a later date. Surgery rescheduled for 05/14/21. Pt voiced understanding.

## 2021-04-23 DIAGNOSIS — D631 Anemia in chronic kidney disease: Secondary | ICD-10-CM | POA: Diagnosis not present

## 2021-04-23 DIAGNOSIS — N2581 Secondary hyperparathyroidism of renal origin: Secondary | ICD-10-CM | POA: Diagnosis not present

## 2021-04-23 DIAGNOSIS — N186 End stage renal disease: Secondary | ICD-10-CM | POA: Diagnosis not present

## 2021-04-23 DIAGNOSIS — Z992 Dependence on renal dialysis: Secondary | ICD-10-CM | POA: Diagnosis not present

## 2021-04-26 DIAGNOSIS — N2581 Secondary hyperparathyroidism of renal origin: Secondary | ICD-10-CM | POA: Diagnosis not present

## 2021-04-26 DIAGNOSIS — D631 Anemia in chronic kidney disease: Secondary | ICD-10-CM | POA: Diagnosis not present

## 2021-04-26 DIAGNOSIS — N186 End stage renal disease: Secondary | ICD-10-CM | POA: Diagnosis not present

## 2021-04-26 DIAGNOSIS — Z992 Dependence on renal dialysis: Secondary | ICD-10-CM | POA: Diagnosis not present

## 2021-04-29 DIAGNOSIS — N186 End stage renal disease: Secondary | ICD-10-CM | POA: Diagnosis not present

## 2021-04-29 DIAGNOSIS — D631 Anemia in chronic kidney disease: Secondary | ICD-10-CM | POA: Diagnosis not present

## 2021-04-29 DIAGNOSIS — N2581 Secondary hyperparathyroidism of renal origin: Secondary | ICD-10-CM | POA: Diagnosis not present

## 2021-04-29 DIAGNOSIS — Z992 Dependence on renal dialysis: Secondary | ICD-10-CM | POA: Diagnosis not present

## 2021-04-29 DIAGNOSIS — N033 Chronic nephritic syndrome with diffuse mesangial proliferative glomerulonephritis: Secondary | ICD-10-CM | POA: Diagnosis not present

## 2021-05-01 DIAGNOSIS — N2581 Secondary hyperparathyroidism of renal origin: Secondary | ICD-10-CM | POA: Diagnosis not present

## 2021-05-01 DIAGNOSIS — N186 End stage renal disease: Secondary | ICD-10-CM | POA: Diagnosis not present

## 2021-05-01 DIAGNOSIS — D631 Anemia in chronic kidney disease: Secondary | ICD-10-CM | POA: Diagnosis not present

## 2021-05-01 DIAGNOSIS — Z992 Dependence on renal dialysis: Secondary | ICD-10-CM | POA: Diagnosis not present

## 2021-05-02 DIAGNOSIS — N186 End stage renal disease: Secondary | ICD-10-CM | POA: Diagnosis not present

## 2021-05-02 DIAGNOSIS — N2581 Secondary hyperparathyroidism of renal origin: Secondary | ICD-10-CM | POA: Diagnosis not present

## 2021-05-02 DIAGNOSIS — Z992 Dependence on renal dialysis: Secondary | ICD-10-CM | POA: Diagnosis not present

## 2021-05-02 DIAGNOSIS — D631 Anemia in chronic kidney disease: Secondary | ICD-10-CM | POA: Diagnosis not present

## 2021-05-06 DIAGNOSIS — N2581 Secondary hyperparathyroidism of renal origin: Secondary | ICD-10-CM | POA: Diagnosis not present

## 2021-05-06 DIAGNOSIS — Z992 Dependence on renal dialysis: Secondary | ICD-10-CM | POA: Diagnosis not present

## 2021-05-06 DIAGNOSIS — D631 Anemia in chronic kidney disease: Secondary | ICD-10-CM | POA: Diagnosis not present

## 2021-05-06 DIAGNOSIS — N186 End stage renal disease: Secondary | ICD-10-CM | POA: Diagnosis not present

## 2021-05-08 DIAGNOSIS — N186 End stage renal disease: Secondary | ICD-10-CM | POA: Diagnosis not present

## 2021-05-08 DIAGNOSIS — D631 Anemia in chronic kidney disease: Secondary | ICD-10-CM | POA: Diagnosis not present

## 2021-05-08 DIAGNOSIS — N2581 Secondary hyperparathyroidism of renal origin: Secondary | ICD-10-CM | POA: Diagnosis not present

## 2021-05-08 DIAGNOSIS — Z992 Dependence on renal dialysis: Secondary | ICD-10-CM | POA: Diagnosis not present

## 2021-05-09 DIAGNOSIS — N186 End stage renal disease: Secondary | ICD-10-CM | POA: Diagnosis not present

## 2021-05-09 DIAGNOSIS — N2581 Secondary hyperparathyroidism of renal origin: Secondary | ICD-10-CM | POA: Diagnosis not present

## 2021-05-09 DIAGNOSIS — D631 Anemia in chronic kidney disease: Secondary | ICD-10-CM | POA: Diagnosis not present

## 2021-05-09 DIAGNOSIS — Z992 Dependence on renal dialysis: Secondary | ICD-10-CM | POA: Diagnosis not present

## 2021-05-13 ENCOUNTER — Other Ambulatory Visit: Payer: Self-pay

## 2021-05-13 ENCOUNTER — Encounter (HOSPITAL_COMMUNITY): Payer: Self-pay | Admitting: Vascular Surgery

## 2021-05-13 DIAGNOSIS — Z992 Dependence on renal dialysis: Secondary | ICD-10-CM | POA: Diagnosis not present

## 2021-05-13 DIAGNOSIS — D631 Anemia in chronic kidney disease: Secondary | ICD-10-CM | POA: Diagnosis not present

## 2021-05-13 DIAGNOSIS — N186 End stage renal disease: Secondary | ICD-10-CM | POA: Diagnosis not present

## 2021-05-13 DIAGNOSIS — N2581 Secondary hyperparathyroidism of renal origin: Secondary | ICD-10-CM | POA: Diagnosis not present

## 2021-05-13 NOTE — Progress Notes (Signed)
Mr. Christopher Wang denies chest pain or shortness of breath. Patient denies having any s/s of Covid in his household.  Patient denies any known exposure to Covid.    I requested an EKG from [facility], patiet was seen there in February 2022 when he was being worked up for kidney transplant.  I requested the tracing.

## 2021-05-14 ENCOUNTER — Ambulatory Visit (HOSPITAL_COMMUNITY): Admission: RE | Admit: 2021-05-14 | Payer: Medicare Other | Source: Home / Self Care | Admitting: Vascular Surgery

## 2021-05-14 SURGERY — FISTULA SUPERFICIALIZATION
Anesthesia: Choice

## 2021-05-14 NOTE — Telephone Encounter (Signed)
Pt called stating he got off at 2AM this morning and missed his surgery on today and requested to reschedule. Advised pt because this is his third reschedule, I will have to discuss with provider if he needs to be seen in office prior to rescheduling. Pt voiced understanding.

## 2021-05-15 DIAGNOSIS — N186 End stage renal disease: Secondary | ICD-10-CM | POA: Diagnosis not present

## 2021-05-15 DIAGNOSIS — Z992 Dependence on renal dialysis: Secondary | ICD-10-CM | POA: Diagnosis not present

## 2021-05-15 DIAGNOSIS — D631 Anemia in chronic kidney disease: Secondary | ICD-10-CM | POA: Diagnosis not present

## 2021-05-15 DIAGNOSIS — N2581 Secondary hyperparathyroidism of renal origin: Secondary | ICD-10-CM | POA: Diagnosis not present

## 2021-05-16 NOTE — Telephone Encounter (Signed)
OK to reschedule surgery without office visit per Dr. Donzetta Matters.   Attempted to reach pt to reschedule, no answer. LVM for patient to return call.

## 2021-05-17 DIAGNOSIS — N186 End stage renal disease: Secondary | ICD-10-CM | POA: Diagnosis not present

## 2021-05-17 DIAGNOSIS — N2581 Secondary hyperparathyroidism of renal origin: Secondary | ICD-10-CM | POA: Diagnosis not present

## 2021-05-17 DIAGNOSIS — Z992 Dependence on renal dialysis: Secondary | ICD-10-CM | POA: Diagnosis not present

## 2021-05-17 DIAGNOSIS — D631 Anemia in chronic kidney disease: Secondary | ICD-10-CM | POA: Diagnosis not present

## 2021-05-17 NOTE — Telephone Encounter (Signed)
Spoke with pt. Rescheduled for surgery on 05/28/21 at [facility] arrival 0530 AM and NPO after midnight. Pt voiced understanding.

## 2021-05-20 DIAGNOSIS — D631 Anemia in chronic kidney disease: Secondary | ICD-10-CM | POA: Diagnosis not present

## 2021-05-20 DIAGNOSIS — N186 End stage renal disease: Secondary | ICD-10-CM | POA: Diagnosis not present

## 2021-05-20 DIAGNOSIS — N2581 Secondary hyperparathyroidism of renal origin: Secondary | ICD-10-CM | POA: Diagnosis not present

## 2021-05-20 DIAGNOSIS — Z992 Dependence on renal dialysis: Secondary | ICD-10-CM | POA: Diagnosis not present

## 2021-05-22 DIAGNOSIS — N186 End stage renal disease: Secondary | ICD-10-CM | POA: Diagnosis not present

## 2021-05-22 DIAGNOSIS — D631 Anemia in chronic kidney disease: Secondary | ICD-10-CM | POA: Diagnosis not present

## 2021-05-22 DIAGNOSIS — N2581 Secondary hyperparathyroidism of renal origin: Secondary | ICD-10-CM | POA: Diagnosis not present

## 2021-05-22 DIAGNOSIS — Z992 Dependence on renal dialysis: Secondary | ICD-10-CM | POA: Diagnosis not present

## 2021-05-23 DIAGNOSIS — N186 End stage renal disease: Secondary | ICD-10-CM | POA: Diagnosis not present

## 2021-05-23 DIAGNOSIS — N2581 Secondary hyperparathyroidism of renal origin: Secondary | ICD-10-CM | POA: Diagnosis not present

## 2021-05-23 DIAGNOSIS — Z992 Dependence on renal dialysis: Secondary | ICD-10-CM | POA: Diagnosis not present

## 2021-05-23 DIAGNOSIS — D631 Anemia in chronic kidney disease: Secondary | ICD-10-CM | POA: Diagnosis not present

## 2021-05-27 ENCOUNTER — Encounter (HOSPITAL_COMMUNITY): Payer: Self-pay | Admitting: Physician Assistant

## 2021-05-27 ENCOUNTER — Other Ambulatory Visit: Payer: Self-pay | Admitting: *Deleted

## 2021-05-27 ENCOUNTER — Other Ambulatory Visit: Payer: Self-pay

## 2021-05-27 DIAGNOSIS — N186 End stage renal disease: Secondary | ICD-10-CM | POA: Diagnosis not present

## 2021-05-27 DIAGNOSIS — Z992 Dependence on renal dialysis: Secondary | ICD-10-CM | POA: Diagnosis not present

## 2021-05-27 DIAGNOSIS — D631 Anemia in chronic kidney disease: Secondary | ICD-10-CM | POA: Diagnosis not present

## 2021-05-27 DIAGNOSIS — N2581 Secondary hyperparathyroidism of renal origin: Secondary | ICD-10-CM | POA: Diagnosis not present

## 2021-05-27 NOTE — Progress Notes (Addendum)
Mr. Bucker denies chest pain  or shortness of breath.  Mr. Dang reports a new nonproductive cough, started 3 days ago.  Patient denies fever, body aches, n/v headache. I notified Jovita Kussmaul, RN, Unit Coordinator, Lindsi said that patient will be tested on arrival and asked me to notify the surgeon's office. I called Dr. Cherrie Gauze office and left a voice message on Kia, RN's phone.  I requested EKG from [facility].

## 2021-05-27 NOTE — Anesthesia Preprocedure Evaluation (Deleted)
Anesthesia Evaluation    Reviewed: Allergy & Precautions, Patient's Chart, lab work & pertinent test results  Airway        Dental   Pulmonary           Cardiovascular hypertension,      Neuro/Psych  Headaches,    GI/Hepatic negative GI ROS, Neg liver ROS,   Endo/Other  negative endocrine ROS  Renal/GU ESRFRenal diseaseHD MWF      Musculoskeletal negative musculoskeletal ROS (+)   Abdominal   Peds  Hematology   Anesthesia Other Findings   Reproductive/Obstetrics                            Anesthesia Physical Anesthesia Plan  ASA: 3  Anesthesia Plan: Regional   Post-op Pain Management:    Induction:   PONV Risk Score and Plan: 3 and Treatment may vary due to age or medical condition, Ondansetron and Midazolam  Airway Management Planned: Natural Airway and Nasal Cannula  Additional Equipment:   Intra-op Plan:   Post-operative Plan:   Informed Consent:     Dental advisory given  Plan Discussed with:   Anesthesia Plan Comments: (PAT notes written by Myra Gianotti, PA-C. )       Anesthesia Quick Evaluation

## 2021-05-27 NOTE — Progress Notes (Signed)
Anesthesia follow-up: See my previous note for Date of Service 04/15/21. Surgery was initially scheduled for 04/15/21, but rescheduled to 05/28/21 for unknown reasons. 11/08/20 EKG from [facility] received. He is for labs and anesthesia team evaluation on the day of surgery.  Additional records received from [facility] include: - US Carotid 01/03/21: Impression: 40-59% diameter reduction of the right bifurcation.  Image suggest this may be overestimated.  No evidence of hemodynamically significant right ICA stenosis.  The right vertebral artery flow is antegrade.  60-79% stenosis of the left bifurcation based on pulse Doppler criteria.  Image suggest this may be overestimated.  The left vertebral artery flow is antegrade.  - US Aorta & Iliac Arteries 01/03/21: Impression:  Aorta: 1.5 cm.  Multiphasic waveform.  IVC 1.1 cm.  Phasic waveform. Right: No evidence of hemodynamically significant stenosis of the iliac arteries.  Patent iliac veins. Left: No evidence of hemodynamically significant stenosis of the iliac arteries.  Patent iliac veins.   Myra Gianotti, PA-C Surgical Short Stay/Anesthesiology [facility] Phone [phone] [facility] Phone [phone] 05/27/2021 12:43 PM

## 2021-05-28 ENCOUNTER — Ambulatory Visit (HOSPITAL_COMMUNITY): Admission: RE | Admit: 2021-05-28 | Payer: Medicare Other | Source: Home / Self Care | Admitting: Vascular Surgery

## 2021-05-28 ENCOUNTER — Other Ambulatory Visit (HOSPITAL_COMMUNITY): Payer: Self-pay | Admitting: Vascular Surgery

## 2021-05-28 ENCOUNTER — Other Ambulatory Visit: Payer: Self-pay

## 2021-05-28 SURGERY — FISTULA SUPERFICIALIZATION
Anesthesia: Choice

## 2021-05-29 DIAGNOSIS — N186 End stage renal disease: Secondary | ICD-10-CM | POA: Diagnosis not present

## 2021-05-29 DIAGNOSIS — Z992 Dependence on renal dialysis: Secondary | ICD-10-CM | POA: Diagnosis not present

## 2021-05-29 DIAGNOSIS — D631 Anemia in chronic kidney disease: Secondary | ICD-10-CM | POA: Diagnosis not present

## 2021-05-29 DIAGNOSIS — N2581 Secondary hyperparathyroidism of renal origin: Secondary | ICD-10-CM | POA: Diagnosis not present

## 2021-05-30 DIAGNOSIS — Z992 Dependence on renal dialysis: Secondary | ICD-10-CM | POA: Diagnosis not present

## 2021-05-30 DIAGNOSIS — N186 End stage renal disease: Secondary | ICD-10-CM | POA: Diagnosis not present

## 2021-05-30 DIAGNOSIS — N033 Chronic nephritic syndrome with diffuse mesangial proliferative glomerulonephritis: Secondary | ICD-10-CM | POA: Diagnosis not present

## 2021-05-31 ENCOUNTER — Encounter (HOSPITAL_COMMUNITY): Payer: Self-pay | Admitting: Vascular Surgery

## 2021-05-31 DIAGNOSIS — N186 End stage renal disease: Secondary | ICD-10-CM | POA: Diagnosis not present

## 2021-05-31 DIAGNOSIS — Z992 Dependence on renal dialysis: Secondary | ICD-10-CM | POA: Diagnosis not present

## 2021-05-31 DIAGNOSIS — N2581 Secondary hyperparathyroidism of renal origin: Secondary | ICD-10-CM | POA: Diagnosis not present

## 2021-05-31 DIAGNOSIS — Z23 Encounter for immunization: Secondary | ICD-10-CM | POA: Diagnosis not present

## 2021-05-31 DIAGNOSIS — D631 Anemia in chronic kidney disease: Secondary | ICD-10-CM | POA: Diagnosis not present

## 2021-05-31 NOTE — Progress Notes (Signed)
Unable to reach patient via phone.  Left a detailed message on machine with instructions for DOS.

## 2021-05-31 NOTE — Progress Notes (Signed)
DUE TO COVID-19 ONLY ONE VISITOR IS ALLOWED TO COME WITH YOU AND STAY IN THE WAITING ROOM ONLY DURING PRE OP AND PROCEDURE DAY OF SURGERY.   PCP - none Cardiologist - Evelina Bucy, PA-C Nephrology - [facility]  Chest x-ray - 11/08/20 CE EKG - 11/08/20 CE from [facility], Per Jan, she requested EKG tracing.. Stress Test - 01/03/21 CE ECHO - 01/03/21 CE Cardiac Cath - n/a  ICD Pacemaker/Loop - n/a  Sleep Study -  n/a CPAP - none  Anesthesia review: Yes  STOP now taking any Aspirin (unless otherwise instructed by your surgeon), Aleve, Naproxen, Ibuprofen, Motrin, Advil, Goody's, BC's, all herbal medications, fish oil, and all vitamins.   Coronavirus Screening Covid test is scheduled on DOS.

## 2021-05-31 NOTE — Anesthesia Preprocedure Evaluation (Deleted)
Anesthesia Evaluation    Airway        Dental   Pulmonary           Cardiovascular hypertension,      Neuro/Psych    GI/Hepatic   Endo/Other    Renal/GU      Musculoskeletal   Abdominal   Peds  Hematology   Anesthesia Other Findings   Reproductive/Obstetrics                             Anesthesia Physical Anesthesia Plan  ASA:   Anesthesia Plan:    Post-op Pain Management:    Induction:   PONV Risk Score and Plan:   Airway Management Planned:   Additional Equipment:   Intra-op Plan:   Post-operative Plan:   Informed Consent:   Plan Discussed with:   Anesthesia Plan Comments: (PAT note written by Myra Gianotti, PA-C. Case rescheduled to 06/04/21. )        Anesthesia Quick Evaluation

## 2021-06-03 DIAGNOSIS — N2581 Secondary hyperparathyroidism of renal origin: Secondary | ICD-10-CM | POA: Diagnosis not present

## 2021-06-03 DIAGNOSIS — D631 Anemia in chronic kidney disease: Secondary | ICD-10-CM | POA: Diagnosis not present

## 2021-06-03 DIAGNOSIS — N186 End stage renal disease: Secondary | ICD-10-CM | POA: Diagnosis not present

## 2021-06-03 DIAGNOSIS — Z23 Encounter for immunization: Secondary | ICD-10-CM | POA: Diagnosis not present

## 2021-06-03 DIAGNOSIS — Z992 Dependence on renal dialysis: Secondary | ICD-10-CM | POA: Diagnosis not present

## 2021-06-04 ENCOUNTER — Other Ambulatory Visit: Payer: Self-pay

## 2021-06-04 ENCOUNTER — Encounter (HOSPITAL_COMMUNITY)
Admission: RE | Disposition: A | Discharge status: Home / Self Care | Payer: Self-pay | Source: Home / Self Care | Attending: Vascular Surgery

## 2021-06-04 ENCOUNTER — Encounter (HOSPITAL_COMMUNITY): Payer: Self-pay | Admitting: Vascular Surgery

## 2021-06-04 ENCOUNTER — Ambulatory Visit (HOSPITAL_COMMUNITY)
Admission: RE | Admit: 2021-06-04 | Discharge: 2021-06-04 | Disposition: A | Discharge status: Home / Self Care | Payer: Medicare Other | Attending: Vascular Surgery | Admitting: Vascular Surgery

## 2021-06-04 DIAGNOSIS — Z20822 Contact with and (suspected) exposure to covid-19: Secondary | ICD-10-CM | POA: Diagnosis not present

## 2021-06-04 DIAGNOSIS — Z539 Procedure and treatment not carried out, unspecified reason: Secondary | ICD-10-CM | POA: Insufficient documentation

## 2021-06-04 LAB — SARS CORONAVIRUS 2 BY RT PCR (HOSPITAL ORDER, PERFORMED IN ~~LOC~~ HOSPITAL LAB): SARS Coronavirus 2: NEGATIVE

## 2021-06-04 SURGERY — REVISON OF ARTERIOVENOUS FISTULA
Anesthesia: Choice

## 2021-06-04 MED ORDER — CHLORHEXIDINE GLUCONATE 4 % EX LIQD: 60.0000 mL | Freq: Once | CUTANEOUS | Status: DC

## 2021-06-04 MED ORDER — LIDOCAINE-EPINEPHRINE (PF) 1 %-1:200000 IJ SOLN
INTRAMUSCULAR | Status: AC
  Filled 2021-06-04: qty 30

## 2021-06-04 MED ORDER — HEPARIN 6000 UNIT IRRIGATION SOLUTION
Status: AC
  Filled 2021-06-04: qty 500

## 2021-06-04 MED ORDER — LACTATED RINGERS IV SOLN: INTRAVENOUS | Status: DC

## 2021-06-04 MED ORDER — HEPARIN SODIUM (PORCINE) 1000 UNIT/ML IJ SOLN
INTRAMUSCULAR | Status: AC
  Filled 2021-06-04: qty 1

## 2021-06-04 MED ORDER — CHLORHEXIDINE GLUCONATE 0.12 % MT SOLN
15.0000 mL | Freq: Once | OROMUCOSAL | Status: DC
  Filled 2021-06-04: qty 15

## 2021-06-04 MED ORDER — SODIUM CHLORIDE 0.9 % IV SOLN: INTRAVENOUS | Status: DC

## 2021-06-04 MED ORDER — CEFAZOLIN SODIUM-DEXTROSE 2-4 GM/100ML-% IV SOLN
2.0000 g | INTRAVENOUS | Status: DC
  Filled 2021-06-04: qty 100

## 2021-06-04 MED ORDER — ORAL CARE MOUTH RINSE: 15.0000 mL | Freq: Once | OROMUCOSAL | Status: DC

## 2021-06-04 NOTE — Progress Notes (Signed)
Dr. Donzetta Matters at bedside to evaluate patient. Both pt and Dr. Donzetta Matters feel that pt still has more use left in fistula, and both agree to cancel procedure today. OR desk notified.

## 2021-06-04 NOTE — H&P (Signed)
    Mr. Web presents today for revision of his AV fistula.  Fortunately in the time since his last evaluation he appears to have healed ulcerated area.  He has multiple areas for cannulation.  We have decided against revision of his fistula today.  I have included a picture in his chart.  He will call us as needed.     Camiah Humm C. Donzetta Matters, MD [facility] Pager: [phone]

## 2021-06-05 DIAGNOSIS — Z23 Encounter for immunization: Secondary | ICD-10-CM | POA: Diagnosis not present

## 2021-06-05 DIAGNOSIS — D631 Anemia in chronic kidney disease: Secondary | ICD-10-CM | POA: Diagnosis not present

## 2021-06-05 DIAGNOSIS — N186 End stage renal disease: Secondary | ICD-10-CM | POA: Diagnosis not present

## 2021-06-05 DIAGNOSIS — N2581 Secondary hyperparathyroidism of renal origin: Secondary | ICD-10-CM | POA: Diagnosis not present

## 2021-06-05 DIAGNOSIS — Z992 Dependence on renal dialysis: Secondary | ICD-10-CM | POA: Diagnosis not present

## 2021-06-07 DIAGNOSIS — Z23 Encounter for immunization: Secondary | ICD-10-CM | POA: Diagnosis not present

## 2021-06-07 DIAGNOSIS — N2581 Secondary hyperparathyroidism of renal origin: Secondary | ICD-10-CM | POA: Diagnosis not present

## 2021-06-07 DIAGNOSIS — N186 End stage renal disease: Secondary | ICD-10-CM | POA: Diagnosis not present

## 2021-06-07 DIAGNOSIS — D631 Anemia in chronic kidney disease: Secondary | ICD-10-CM | POA: Diagnosis not present

## 2021-06-07 DIAGNOSIS — Z992 Dependence on renal dialysis: Secondary | ICD-10-CM | POA: Diagnosis not present

## 2021-06-10 DIAGNOSIS — N2581 Secondary hyperparathyroidism of renal origin: Secondary | ICD-10-CM | POA: Diagnosis not present

## 2021-06-10 DIAGNOSIS — D631 Anemia in chronic kidney disease: Secondary | ICD-10-CM | POA: Diagnosis not present

## 2021-06-10 DIAGNOSIS — N186 End stage renal disease: Secondary | ICD-10-CM | POA: Diagnosis not present

## 2021-06-10 DIAGNOSIS — Z23 Encounter for immunization: Secondary | ICD-10-CM | POA: Diagnosis not present

## 2021-06-10 DIAGNOSIS — Z992 Dependence on renal dialysis: Secondary | ICD-10-CM | POA: Diagnosis not present

## 2021-06-12 DIAGNOSIS — N186 End stage renal disease: Secondary | ICD-10-CM | POA: Diagnosis not present

## 2021-06-12 DIAGNOSIS — D631 Anemia in chronic kidney disease: Secondary | ICD-10-CM | POA: Diagnosis not present

## 2021-06-12 DIAGNOSIS — N2581 Secondary hyperparathyroidism of renal origin: Secondary | ICD-10-CM | POA: Diagnosis not present

## 2021-06-12 DIAGNOSIS — Z23 Encounter for immunization: Secondary | ICD-10-CM | POA: Diagnosis not present

## 2021-06-12 DIAGNOSIS — Z992 Dependence on renal dialysis: Secondary | ICD-10-CM | POA: Diagnosis not present

## 2021-06-14 DIAGNOSIS — N186 End stage renal disease: Secondary | ICD-10-CM | POA: Diagnosis not present

## 2021-06-14 DIAGNOSIS — Z992 Dependence on renal dialysis: Secondary | ICD-10-CM | POA: Diagnosis not present

## 2021-06-14 DIAGNOSIS — N2581 Secondary hyperparathyroidism of renal origin: Secondary | ICD-10-CM | POA: Diagnosis not present

## 2021-06-14 DIAGNOSIS — Z23 Encounter for immunization: Secondary | ICD-10-CM | POA: Diagnosis not present

## 2021-06-14 DIAGNOSIS — D631 Anemia in chronic kidney disease: Secondary | ICD-10-CM | POA: Diagnosis not present

## 2021-06-17 DIAGNOSIS — Z23 Encounter for immunization: Secondary | ICD-10-CM | POA: Diagnosis not present

## 2021-06-17 DIAGNOSIS — N186 End stage renal disease: Secondary | ICD-10-CM | POA: Diagnosis not present

## 2021-06-17 DIAGNOSIS — D631 Anemia in chronic kidney disease: Secondary | ICD-10-CM | POA: Diagnosis not present

## 2021-06-17 DIAGNOSIS — Z992 Dependence on renal dialysis: Secondary | ICD-10-CM | POA: Diagnosis not present

## 2021-06-17 DIAGNOSIS — N2581 Secondary hyperparathyroidism of renal origin: Secondary | ICD-10-CM | POA: Diagnosis not present

## 2021-06-19 DIAGNOSIS — D631 Anemia in chronic kidney disease: Secondary | ICD-10-CM | POA: Diagnosis not present

## 2021-06-19 DIAGNOSIS — N186 End stage renal disease: Secondary | ICD-10-CM | POA: Diagnosis not present

## 2021-06-19 DIAGNOSIS — Z23 Encounter for immunization: Secondary | ICD-10-CM | POA: Diagnosis not present

## 2021-06-19 DIAGNOSIS — Z992 Dependence on renal dialysis: Secondary | ICD-10-CM | POA: Diagnosis not present

## 2021-06-19 DIAGNOSIS — N2581 Secondary hyperparathyroidism of renal origin: Secondary | ICD-10-CM | POA: Diagnosis not present

## 2021-06-21 DIAGNOSIS — N186 End stage renal disease: Secondary | ICD-10-CM | POA: Diagnosis not present

## 2021-06-21 DIAGNOSIS — N2581 Secondary hyperparathyroidism of renal origin: Secondary | ICD-10-CM | POA: Diagnosis not present

## 2021-06-21 DIAGNOSIS — Z992 Dependence on renal dialysis: Secondary | ICD-10-CM | POA: Diagnosis not present

## 2021-06-21 DIAGNOSIS — Z23 Encounter for immunization: Secondary | ICD-10-CM | POA: Diagnosis not present

## 2021-06-21 DIAGNOSIS — D631 Anemia in chronic kidney disease: Secondary | ICD-10-CM | POA: Diagnosis not present

## 2021-06-24 DIAGNOSIS — Z992 Dependence on renal dialysis: Secondary | ICD-10-CM | POA: Diagnosis not present

## 2021-06-24 DIAGNOSIS — N2581 Secondary hyperparathyroidism of renal origin: Secondary | ICD-10-CM | POA: Diagnosis not present

## 2021-06-24 DIAGNOSIS — Z23 Encounter for immunization: Secondary | ICD-10-CM | POA: Diagnosis not present

## 2021-06-24 DIAGNOSIS — N186 End stage renal disease: Secondary | ICD-10-CM | POA: Diagnosis not present

## 2021-06-24 DIAGNOSIS — D631 Anemia in chronic kidney disease: Secondary | ICD-10-CM | POA: Diagnosis not present

## 2021-06-26 DIAGNOSIS — N186 End stage renal disease: Secondary | ICD-10-CM | POA: Diagnosis not present

## 2021-06-26 DIAGNOSIS — N2581 Secondary hyperparathyroidism of renal origin: Secondary | ICD-10-CM | POA: Diagnosis not present

## 2021-06-26 DIAGNOSIS — D631 Anemia in chronic kidney disease: Secondary | ICD-10-CM | POA: Diagnosis not present

## 2021-06-26 DIAGNOSIS — Z992 Dependence on renal dialysis: Secondary | ICD-10-CM | POA: Diagnosis not present

## 2021-06-26 DIAGNOSIS — Z23 Encounter for immunization: Secondary | ICD-10-CM | POA: Diagnosis not present

## 2021-06-28 DIAGNOSIS — Z992 Dependence on renal dialysis: Secondary | ICD-10-CM | POA: Diagnosis not present

## 2021-06-28 DIAGNOSIS — N2581 Secondary hyperparathyroidism of renal origin: Secondary | ICD-10-CM | POA: Diagnosis not present

## 2021-06-28 DIAGNOSIS — N186 End stage renal disease: Secondary | ICD-10-CM | POA: Diagnosis not present

## 2021-06-28 DIAGNOSIS — Z23 Encounter for immunization: Secondary | ICD-10-CM | POA: Diagnosis not present

## 2021-06-28 DIAGNOSIS — D631 Anemia in chronic kidney disease: Secondary | ICD-10-CM | POA: Diagnosis not present

## 2021-06-29 DIAGNOSIS — Z992 Dependence on renal dialysis: Secondary | ICD-10-CM | POA: Diagnosis not present

## 2021-06-29 DIAGNOSIS — N186 End stage renal disease: Secondary | ICD-10-CM | POA: Diagnosis not present

## 2021-06-29 DIAGNOSIS — N033 Chronic nephritic syndrome with diffuse mesangial proliferative glomerulonephritis: Secondary | ICD-10-CM | POA: Diagnosis not present

## 2021-07-01 DIAGNOSIS — Z992 Dependence on renal dialysis: Secondary | ICD-10-CM | POA: Diagnosis not present

## 2021-07-01 DIAGNOSIS — N2581 Secondary hyperparathyroidism of renal origin: Secondary | ICD-10-CM | POA: Diagnosis not present

## 2021-07-01 DIAGNOSIS — N186 End stage renal disease: Secondary | ICD-10-CM | POA: Diagnosis not present

## 2021-07-03 DIAGNOSIS — N186 End stage renal disease: Secondary | ICD-10-CM | POA: Diagnosis not present

## 2021-07-03 DIAGNOSIS — N2581 Secondary hyperparathyroidism of renal origin: Secondary | ICD-10-CM | POA: Diagnosis not present

## 2021-07-03 DIAGNOSIS — Z992 Dependence on renal dialysis: Secondary | ICD-10-CM | POA: Diagnosis not present

## 2021-07-05 DIAGNOSIS — N2581 Secondary hyperparathyroidism of renal origin: Secondary | ICD-10-CM | POA: Diagnosis not present

## 2021-07-05 DIAGNOSIS — N186 End stage renal disease: Secondary | ICD-10-CM | POA: Diagnosis not present

## 2021-07-05 DIAGNOSIS — Z992 Dependence on renal dialysis: Secondary | ICD-10-CM | POA: Diagnosis not present

## 2021-07-08 DIAGNOSIS — Z992 Dependence on renal dialysis: Secondary | ICD-10-CM | POA: Diagnosis not present

## 2021-07-08 DIAGNOSIS — N186 End stage renal disease: Secondary | ICD-10-CM | POA: Diagnosis not present

## 2021-07-08 DIAGNOSIS — N2581 Secondary hyperparathyroidism of renal origin: Secondary | ICD-10-CM | POA: Diagnosis not present

## 2021-07-10 DIAGNOSIS — N2581 Secondary hyperparathyroidism of renal origin: Secondary | ICD-10-CM | POA: Diagnosis not present

## 2021-07-10 DIAGNOSIS — Z992 Dependence on renal dialysis: Secondary | ICD-10-CM | POA: Diagnosis not present

## 2021-07-10 DIAGNOSIS — N186 End stage renal disease: Secondary | ICD-10-CM | POA: Diagnosis not present

## 2021-07-12 DIAGNOSIS — N186 End stage renal disease: Secondary | ICD-10-CM | POA: Diagnosis not present

## 2021-07-12 DIAGNOSIS — Z992 Dependence on renal dialysis: Secondary | ICD-10-CM | POA: Diagnosis not present

## 2021-07-12 DIAGNOSIS — N2581 Secondary hyperparathyroidism of renal origin: Secondary | ICD-10-CM | POA: Diagnosis not present

## 2021-07-15 DIAGNOSIS — Z992 Dependence on renal dialysis: Secondary | ICD-10-CM | POA: Diagnosis not present

## 2021-07-15 DIAGNOSIS — N186 End stage renal disease: Secondary | ICD-10-CM | POA: Diagnosis not present

## 2021-07-15 DIAGNOSIS — N2581 Secondary hyperparathyroidism of renal origin: Secondary | ICD-10-CM | POA: Diagnosis not present

## 2021-07-17 DIAGNOSIS — Z992 Dependence on renal dialysis: Secondary | ICD-10-CM | POA: Diagnosis not present

## 2021-07-17 DIAGNOSIS — N186 End stage renal disease: Secondary | ICD-10-CM | POA: Diagnosis not present

## 2021-07-17 DIAGNOSIS — N2581 Secondary hyperparathyroidism of renal origin: Secondary | ICD-10-CM | POA: Diagnosis not present

## 2021-07-19 DIAGNOSIS — N2581 Secondary hyperparathyroidism of renal origin: Secondary | ICD-10-CM | POA: Diagnosis not present

## 2021-07-19 DIAGNOSIS — N186 End stage renal disease: Secondary | ICD-10-CM | POA: Diagnosis not present

## 2021-07-19 DIAGNOSIS — Z992 Dependence on renal dialysis: Secondary | ICD-10-CM | POA: Diagnosis not present

## 2021-07-22 DIAGNOSIS — Z992 Dependence on renal dialysis: Secondary | ICD-10-CM | POA: Diagnosis not present

## 2021-07-22 DIAGNOSIS — N186 End stage renal disease: Secondary | ICD-10-CM | POA: Diagnosis not present

## 2021-07-22 DIAGNOSIS — N2581 Secondary hyperparathyroidism of renal origin: Secondary | ICD-10-CM | POA: Diagnosis not present

## 2021-07-24 DIAGNOSIS — N186 End stage renal disease: Secondary | ICD-10-CM | POA: Diagnosis not present

## 2021-07-24 DIAGNOSIS — Z992 Dependence on renal dialysis: Secondary | ICD-10-CM | POA: Diagnosis not present

## 2021-07-24 DIAGNOSIS — N2581 Secondary hyperparathyroidism of renal origin: Secondary | ICD-10-CM | POA: Diagnosis not present

## 2021-07-26 DIAGNOSIS — Z992 Dependence on renal dialysis: Secondary | ICD-10-CM | POA: Diagnosis not present

## 2021-07-26 DIAGNOSIS — N186 End stage renal disease: Secondary | ICD-10-CM | POA: Diagnosis not present

## 2021-07-26 DIAGNOSIS — N2581 Secondary hyperparathyroidism of renal origin: Secondary | ICD-10-CM | POA: Diagnosis not present

## 2021-07-29 DIAGNOSIS — N2581 Secondary hyperparathyroidism of renal origin: Secondary | ICD-10-CM | POA: Diagnosis not present

## 2021-07-29 DIAGNOSIS — Z992 Dependence on renal dialysis: Secondary | ICD-10-CM | POA: Diagnosis not present

## 2021-07-29 DIAGNOSIS — N186 End stage renal disease: Secondary | ICD-10-CM | POA: Diagnosis not present

## 2021-07-30 DIAGNOSIS — N033 Chronic nephritic syndrome with diffuse mesangial proliferative glomerulonephritis: Secondary | ICD-10-CM | POA: Diagnosis not present

## 2021-07-30 DIAGNOSIS — N186 End stage renal disease: Secondary | ICD-10-CM | POA: Diagnosis not present

## 2021-07-30 DIAGNOSIS — Z992 Dependence on renal dialysis: Secondary | ICD-10-CM | POA: Diagnosis not present

## 2021-07-31 DIAGNOSIS — N186 End stage renal disease: Secondary | ICD-10-CM | POA: Diagnosis not present

## 2021-07-31 DIAGNOSIS — N2581 Secondary hyperparathyroidism of renal origin: Secondary | ICD-10-CM | POA: Diagnosis not present

## 2021-07-31 DIAGNOSIS — Z992 Dependence on renal dialysis: Secondary | ICD-10-CM | POA: Diagnosis not present

## 2021-08-02 DIAGNOSIS — Z992 Dependence on renal dialysis: Secondary | ICD-10-CM | POA: Diagnosis not present

## 2021-08-02 DIAGNOSIS — N2581 Secondary hyperparathyroidism of renal origin: Secondary | ICD-10-CM | POA: Diagnosis not present

## 2021-08-02 DIAGNOSIS — N186 End stage renal disease: Secondary | ICD-10-CM | POA: Diagnosis not present

## 2021-08-07 DIAGNOSIS — N186 End stage renal disease: Secondary | ICD-10-CM | POA: Diagnosis not present

## 2021-08-07 DIAGNOSIS — N2581 Secondary hyperparathyroidism of renal origin: Secondary | ICD-10-CM | POA: Diagnosis not present

## 2021-08-07 DIAGNOSIS — Z992 Dependence on renal dialysis: Secondary | ICD-10-CM | POA: Diagnosis not present

## 2021-08-08 DIAGNOSIS — N186 End stage renal disease: Secondary | ICD-10-CM | POA: Diagnosis not present

## 2021-08-08 DIAGNOSIS — Z992 Dependence on renal dialysis: Secondary | ICD-10-CM | POA: Diagnosis not present

## 2021-08-08 DIAGNOSIS — N2581 Secondary hyperparathyroidism of renal origin: Secondary | ICD-10-CM | POA: Diagnosis not present

## 2021-08-12 DIAGNOSIS — Z992 Dependence on renal dialysis: Secondary | ICD-10-CM | POA: Diagnosis not present

## 2021-08-12 DIAGNOSIS — N186 End stage renal disease: Secondary | ICD-10-CM | POA: Diagnosis not present

## 2021-08-12 DIAGNOSIS — N2581 Secondary hyperparathyroidism of renal origin: Secondary | ICD-10-CM | POA: Diagnosis not present

## 2021-08-14 DIAGNOSIS — Z992 Dependence on renal dialysis: Secondary | ICD-10-CM | POA: Diagnosis not present

## 2021-08-14 DIAGNOSIS — N186 End stage renal disease: Secondary | ICD-10-CM | POA: Diagnosis not present

## 2021-08-14 DIAGNOSIS — N2581 Secondary hyperparathyroidism of renal origin: Secondary | ICD-10-CM | POA: Diagnosis not present

## 2021-08-16 DIAGNOSIS — N186 End stage renal disease: Secondary | ICD-10-CM | POA: Diagnosis not present

## 2021-08-16 DIAGNOSIS — Z992 Dependence on renal dialysis: Secondary | ICD-10-CM | POA: Diagnosis not present

## 2021-08-16 DIAGNOSIS — N2581 Secondary hyperparathyroidism of renal origin: Secondary | ICD-10-CM | POA: Diagnosis not present

## 2021-08-19 DIAGNOSIS — N2581 Secondary hyperparathyroidism of renal origin: Secondary | ICD-10-CM | POA: Diagnosis not present

## 2021-08-19 DIAGNOSIS — N186 End stage renal disease: Secondary | ICD-10-CM | POA: Diagnosis not present

## 2021-08-19 DIAGNOSIS — Z992 Dependence on renal dialysis: Secondary | ICD-10-CM | POA: Diagnosis not present

## 2021-08-21 DIAGNOSIS — N2581 Secondary hyperparathyroidism of renal origin: Secondary | ICD-10-CM | POA: Diagnosis not present

## 2021-08-21 DIAGNOSIS — Z992 Dependence on renal dialysis: Secondary | ICD-10-CM | POA: Diagnosis not present

## 2021-08-21 DIAGNOSIS — N186 End stage renal disease: Secondary | ICD-10-CM | POA: Diagnosis not present

## 2021-08-24 DIAGNOSIS — N186 End stage renal disease: Secondary | ICD-10-CM | POA: Diagnosis not present

## 2021-08-24 DIAGNOSIS — N2581 Secondary hyperparathyroidism of renal origin: Secondary | ICD-10-CM | POA: Diagnosis not present

## 2021-08-24 DIAGNOSIS — Z992 Dependence on renal dialysis: Secondary | ICD-10-CM | POA: Diagnosis not present

## 2021-08-26 DIAGNOSIS — Z992 Dependence on renal dialysis: Secondary | ICD-10-CM | POA: Diagnosis not present

## 2021-08-26 DIAGNOSIS — N2581 Secondary hyperparathyroidism of renal origin: Secondary | ICD-10-CM | POA: Diagnosis not present

## 2021-08-26 DIAGNOSIS — N186 End stage renal disease: Secondary | ICD-10-CM | POA: Diagnosis not present

## 2021-08-28 DIAGNOSIS — N186 End stage renal disease: Secondary | ICD-10-CM | POA: Diagnosis not present

## 2021-08-28 DIAGNOSIS — N2581 Secondary hyperparathyroidism of renal origin: Secondary | ICD-10-CM | POA: Diagnosis not present

## 2021-08-28 DIAGNOSIS — Z992 Dependence on renal dialysis: Secondary | ICD-10-CM | POA: Diagnosis not present

## 2021-08-29 DIAGNOSIS — Z992 Dependence on renal dialysis: Secondary | ICD-10-CM | POA: Diagnosis not present

## 2021-08-29 DIAGNOSIS — N186 End stage renal disease: Secondary | ICD-10-CM | POA: Diagnosis not present

## 2021-08-29 DIAGNOSIS — N033 Chronic nephritic syndrome with diffuse mesangial proliferative glomerulonephritis: Secondary | ICD-10-CM | POA: Diagnosis not present

## 2021-08-30 DIAGNOSIS — N186 End stage renal disease: Secondary | ICD-10-CM | POA: Diagnosis not present

## 2021-08-30 DIAGNOSIS — Z992 Dependence on renal dialysis: Secondary | ICD-10-CM | POA: Diagnosis not present

## 2021-08-30 DIAGNOSIS — N2581 Secondary hyperparathyroidism of renal origin: Secondary | ICD-10-CM | POA: Diagnosis not present

## 2021-08-30 DIAGNOSIS — Z23 Encounter for immunization: Secondary | ICD-10-CM | POA: Diagnosis not present

## 2021-09-02 DIAGNOSIS — Z992 Dependence on renal dialysis: Secondary | ICD-10-CM | POA: Diagnosis not present

## 2021-09-02 DIAGNOSIS — N2581 Secondary hyperparathyroidism of renal origin: Secondary | ICD-10-CM | POA: Diagnosis not present

## 2021-09-02 DIAGNOSIS — Z23 Encounter for immunization: Secondary | ICD-10-CM | POA: Diagnosis not present

## 2021-09-02 DIAGNOSIS — N186 End stage renal disease: Secondary | ICD-10-CM | POA: Diagnosis not present

## 2021-09-04 DIAGNOSIS — Z23 Encounter for immunization: Secondary | ICD-10-CM | POA: Diagnosis not present

## 2021-09-04 DIAGNOSIS — N2581 Secondary hyperparathyroidism of renal origin: Secondary | ICD-10-CM | POA: Diagnosis not present

## 2021-09-04 DIAGNOSIS — N186 End stage renal disease: Secondary | ICD-10-CM | POA: Diagnosis not present

## 2021-09-04 DIAGNOSIS — Z992 Dependence on renal dialysis: Secondary | ICD-10-CM | POA: Diagnosis not present

## 2021-09-07 DIAGNOSIS — Z23 Encounter for immunization: Secondary | ICD-10-CM | POA: Diagnosis not present

## 2021-09-07 DIAGNOSIS — N2581 Secondary hyperparathyroidism of renal origin: Secondary | ICD-10-CM | POA: Diagnosis not present

## 2021-09-07 DIAGNOSIS — N186 End stage renal disease: Secondary | ICD-10-CM | POA: Diagnosis not present

## 2021-09-07 DIAGNOSIS — Z992 Dependence on renal dialysis: Secondary | ICD-10-CM | POA: Diagnosis not present

## 2021-09-09 DIAGNOSIS — N186 End stage renal disease: Secondary | ICD-10-CM | POA: Diagnosis not present

## 2021-09-09 DIAGNOSIS — Z992 Dependence on renal dialysis: Secondary | ICD-10-CM | POA: Diagnosis not present

## 2021-09-09 DIAGNOSIS — Z23 Encounter for immunization: Secondary | ICD-10-CM | POA: Diagnosis not present

## 2021-09-09 DIAGNOSIS — N2581 Secondary hyperparathyroidism of renal origin: Secondary | ICD-10-CM | POA: Diagnosis not present

## 2021-09-12 DIAGNOSIS — Z23 Encounter for immunization: Secondary | ICD-10-CM | POA: Diagnosis not present

## 2021-09-12 DIAGNOSIS — N186 End stage renal disease: Secondary | ICD-10-CM | POA: Diagnosis not present

## 2021-09-12 DIAGNOSIS — Z992 Dependence on renal dialysis: Secondary | ICD-10-CM | POA: Diagnosis not present

## 2021-09-12 DIAGNOSIS — N2581 Secondary hyperparathyroidism of renal origin: Secondary | ICD-10-CM | POA: Diagnosis not present

## 2021-09-13 DIAGNOSIS — Z992 Dependence on renal dialysis: Secondary | ICD-10-CM | POA: Diagnosis not present

## 2021-09-13 DIAGNOSIS — N2581 Secondary hyperparathyroidism of renal origin: Secondary | ICD-10-CM | POA: Diagnosis not present

## 2021-09-13 DIAGNOSIS — N186 End stage renal disease: Secondary | ICD-10-CM | POA: Diagnosis not present

## 2021-09-13 DIAGNOSIS — Z23 Encounter for immunization: Secondary | ICD-10-CM | POA: Diagnosis not present

## 2021-09-16 DIAGNOSIS — N186 End stage renal disease: Secondary | ICD-10-CM | POA: Diagnosis not present

## 2021-09-16 DIAGNOSIS — Z992 Dependence on renal dialysis: Secondary | ICD-10-CM | POA: Diagnosis not present

## 2021-09-16 DIAGNOSIS — N2581 Secondary hyperparathyroidism of renal origin: Secondary | ICD-10-CM | POA: Diagnosis not present

## 2021-09-16 DIAGNOSIS — Z23 Encounter for immunization: Secondary | ICD-10-CM | POA: Diagnosis not present

## 2021-09-18 DIAGNOSIS — Z992 Dependence on renal dialysis: Secondary | ICD-10-CM | POA: Diagnosis not present

## 2021-09-18 DIAGNOSIS — Z23 Encounter for immunization: Secondary | ICD-10-CM | POA: Diagnosis not present

## 2021-09-18 DIAGNOSIS — N2581 Secondary hyperparathyroidism of renal origin: Secondary | ICD-10-CM | POA: Diagnosis not present

## 2021-09-18 DIAGNOSIS — N186 End stage renal disease: Secondary | ICD-10-CM | POA: Diagnosis not present

## 2021-09-19 ENCOUNTER — Ambulatory Visit: Payer: Medicare Other

## 2021-09-20 DIAGNOSIS — N186 End stage renal disease: Secondary | ICD-10-CM | POA: Diagnosis not present

## 2021-09-20 DIAGNOSIS — N2581 Secondary hyperparathyroidism of renal origin: Secondary | ICD-10-CM | POA: Diagnosis not present

## 2021-09-20 DIAGNOSIS — Z23 Encounter for immunization: Secondary | ICD-10-CM | POA: Diagnosis not present

## 2021-09-20 DIAGNOSIS — Z992 Dependence on renal dialysis: Secondary | ICD-10-CM | POA: Diagnosis not present

## 2021-09-23 DIAGNOSIS — Z992 Dependence on renal dialysis: Secondary | ICD-10-CM | POA: Diagnosis not present

## 2021-09-23 DIAGNOSIS — N2581 Secondary hyperparathyroidism of renal origin: Secondary | ICD-10-CM | POA: Diagnosis not present

## 2021-09-23 DIAGNOSIS — Z23 Encounter for immunization: Secondary | ICD-10-CM | POA: Diagnosis not present

## 2021-09-23 DIAGNOSIS — N186 End stage renal disease: Secondary | ICD-10-CM | POA: Diagnosis not present

## 2021-09-26 DIAGNOSIS — N2581 Secondary hyperparathyroidism of renal origin: Secondary | ICD-10-CM | POA: Diagnosis not present

## 2021-09-26 DIAGNOSIS — N186 End stage renal disease: Secondary | ICD-10-CM | POA: Diagnosis not present

## 2021-09-26 DIAGNOSIS — Z992 Dependence on renal dialysis: Secondary | ICD-10-CM | POA: Diagnosis not present

## 2021-09-26 DIAGNOSIS — Z23 Encounter for immunization: Secondary | ICD-10-CM | POA: Diagnosis not present

## 2021-09-27 DIAGNOSIS — Z992 Dependence on renal dialysis: Secondary | ICD-10-CM | POA: Diagnosis not present

## 2021-09-27 DIAGNOSIS — Z23 Encounter for immunization: Secondary | ICD-10-CM | POA: Diagnosis not present

## 2021-09-27 DIAGNOSIS — N186 End stage renal disease: Secondary | ICD-10-CM | POA: Diagnosis not present

## 2021-09-27 DIAGNOSIS — N2581 Secondary hyperparathyroidism of renal origin: Secondary | ICD-10-CM | POA: Diagnosis not present

## 2022-07-03 ENCOUNTER — Emergency Department (HOSPITAL_COMMUNITY)
Admission: EM | Admit: 2022-07-03 | Discharge: 2022-07-03 | Disposition: A | Discharge status: Home / Self Care | Payer: Medicare Other | Attending: Emergency Medicine | Admitting: Emergency Medicine

## 2022-07-03 ENCOUNTER — Other Ambulatory Visit: Payer: Self-pay

## 2022-07-03 DIAGNOSIS — M25571 Pain in right ankle and joints of right foot: Secondary | ICD-10-CM | POA: Diagnosis present

## 2022-07-03 DIAGNOSIS — N186 End stage renal disease: Secondary | ICD-10-CM | POA: Diagnosis not present

## 2022-07-03 DIAGNOSIS — I120 Hypertensive chronic kidney disease with stage 5 chronic kidney disease or end stage renal disease: Secondary | ICD-10-CM | POA: Insufficient documentation

## 2022-07-03 DIAGNOSIS — Z992 Dependence on renal dialysis: Secondary | ICD-10-CM | POA: Diagnosis not present

## 2022-07-03 MED ORDER — HYDROCODONE-ACETAMINOPHEN 5-325 MG PO TABS
1.0000 | ORAL_TABLET | Freq: Once | ORAL | Status: AC
  Administered 2022-07-03: 1 via ORAL
  Filled 2022-07-03: qty 1

## 2022-07-03 MED ORDER — METHYLPREDNISOLONE 4 MG PO TBPK: ORAL_TABLET | ORAL | 0 refills | Status: DC

## 2022-07-03 NOTE — ED Provider Notes (Signed)
[facility] DEPT Provider Note   CSN: [id number] Arrival date & time: 07/03/22  0407     History  Chief Complaint  Patient presents with   Ankle Pain    Christopher Wang is a 36 y.o. male.  HPI     This is a 36 year old male with end-stage renal disease on dialysis Monday, Wednesday, Friday who presents with ankle pain.  Patient reports several day history of right-sided ankle pain.  Denies injury.  No history of arthritis or gout.  He has not noted any fevers or redness.  Pain is on the lateral aspect of the ankle.  No numbness or tingling.  Denies any alcohol use.  Reports that he makes very little urine and is hoping to get a kidney transplant.  Home Medications Prior to Admission medications   Medication Sig Start Date End Date Taking? Authorizing Provider  methylPREDNISolone (MEDROL DOSEPAK) 4 MG TBPK tablet Take as directed on packet. 07/03/22  Yes Payden Bonus, Barbette Hair, MD  FOSRENOL 1000 MG PACK Take 1,000 mg by mouth 2 (two) times daily with a meal.  08/06/17   [provider]      Allergies    Patient has no known allergies.    Review of Systems   Review of Systems  Constitutional:  Negative for fever.  Musculoskeletal:        Ankle pain  All other systems reviewed and are negative.   Physical Exam Updated Vital Signs BP 106/67 (BP Location: Right Arm)   Pulse 95   Temp 98.3 F (36.8 C) (Oral)   Resp 18   Ht 1.829 m (6')   Wt 72.6 kg   SpO2 100%   BMI 21.70 kg/m  Physical Exam Vitals and nursing note reviewed.  Constitutional:      Appearance: He is well-developed. He is not ill-appearing.  HENT:     Head: Normocephalic and atraumatic.  Eyes:     Pupils: Pupils are equal, round, and reactive to light.  Cardiovascular:     Rate and Rhythm: Normal rate and regular rhythm.  Pulmonary:     Effort: Pulmonary effort is normal. No respiratory distress.  Abdominal:     Palpations: Abdomen is soft.     Tenderness:  There is no abdominal tenderness.  Musculoskeletal:     Cervical back: Neck supple.     Comments: Focused examination of the right ankle with normal range of motion in flexion and extension, there is slight swelling noted over the lateral malleolus, no overlying skin changes, no erythema or warmth, 2+ DP pulse  Lymphadenopathy:     Cervical: No cervical adenopathy.  Skin:    General: Skin is warm and dry.  Neurological:     Mental Status: He is alert and oriented to person, place, and time.  Psychiatric:        Mood and Affect: Mood normal.     ED Results / Procedures / Treatments   Labs (all labs ordered are listed, but only abnormal results are displayed) Labs Reviewed - No data to display  EKG None  Radiology No results found.  Procedures Procedures    Medications Ordered in ED Medications  HYDROcodone-acetaminophen (NORCO/VICODIN) 5-325 MG per tablet 1 tablet (1 tablet Oral Given 07/03/22 0532)    ED Course/ Medical Decision Making/ A&P                           Medical Decision Making Amount  and/or Complexity of Data Reviewed Radiology: ordered.  Risk Prescription drug management.   This patient presents to the ED for concern of ankle pain, this involves an extensive number of treatment options, and is a complaint that carries with it a high risk of complications and morbidity.  I considered the following differential and admission for this acute, potentially life threatening condition.  The differential diagnosis includes occult injury such as sprain, arthritis such as gout, less likely septic arthritis  MDM:    This is a 36 year old male who presents with ankle pain.  Reports atraumatic pain.  He is nontoxic and vital signs are reassuring.  He is afebrile.  No signs or symptoms of infection.  Doubt septic arthritis.  Low suspicion for bony injury as he denies trauma and is ambulatory.  He has normal range of motion.  Suspect potentially arthritis or  inflammatory arthritis such as gout.  While he makes very little urine and reports he wants a kidney transplant, would likely fare better with a Medrol Dosepak and anti-inflammatories.  We will send home with a Medrol Dosepak.  He was given 1 dose of hydrocodone here for pain control.  (Labs, imaging, consults)  Labs: I Ordered, and personally interpreted labs.  The pertinent results include: None  Imaging Studies ordered: I ordered imaging studies including no indication I independently visualized and interpreted imaging. I agree with the radiologist interpretation  Additional history obtained from chart review.  External records from outside source obtained and reviewed including prior evaluations  Cardiac Monitoring: The patient was maintained on a cardiac monitor.  I personally viewed and interpreted the cardiac monitored which showed an underlying rhythm of: Sinus rhythm  Reevaluation: After the interventions noted above, I reevaluated the patient and found that they have :improved  Social Determinants of Health: Lives independently, end-stage renal disease  Disposition: Discharge  Co morbidities that complicate the patient evaluation  Past Medical History:  Diagnosis Date   Acute on chronic renal insufficiency 07/15/2013   Anemia    Edema of lower extremity 08/06/2011   Enteritis 07/15/2013   ESRD (end stage renal disease) on dialysis [facility])    "Jeneen Rinks; MWF" (07/15/2018)   Headache    Hypertension 08/09/2011   Nephrotic syndrome    Scrotal edema 08/06/2011   Syncope 08/03/2013   UTI (urinary tract infection)      Medicines Meds ordered this encounter  Medications   HYDROcodone-acetaminophen (NORCO/VICODIN) 5-325 MG per tablet 1 tablet   methylPREDNISolone (MEDROL DOSEPAK) 4 MG TBPK tablet    Sig: Take as directed on packet.    Dispense:  21 tablet    Refill:  0    I have reviewed the patients home medicines and have made adjustments as needed  Problem  List / ED Course: Problem List Items Addressed This Visit   None Visit Diagnoses     Acute right ankle pain    -  Primary                   Final Clinical Impression(s) / ED Diagnoses Final diagnoses:  Acute right ankle pain    Rx / DC Orders ED Discharge Orders          Ordered    methylPREDNISolone (MEDROL DOSEPAK) 4 MG TBPK tablet        07/03/22 0534              Merryl Hacker, MD 07/03/22 [phone]

## 2022-07-03 NOTE — ED Notes (Signed)
Pt ambulatory to bed.

## 2022-07-03 NOTE — ED Triage Notes (Signed)
Patient coming to ED for evaluation of R ankle pain.  Reports pain started "a few days ago."  No reports of injury.  No hx of gout.  Pain with ambulation

## 2022-07-03 NOTE — Discharge Instructions (Addendum)
You were seen today for ankle pain and swelling.  You may have a new onset arthritis such as inflammatory arthritis or gout.  Take medications as prescribed.  If you develop fever, redness, any new or worsening symptoms, you should be reevaluated.

## 2022-07-30 ENCOUNTER — Ambulatory Visit: Payer: Medicare Other

## 2022-09-03 ENCOUNTER — Ambulatory Visit: Payer: Medicare Other | Admitting: Vascular Surgery

## 2022-09-10 ENCOUNTER — Ambulatory Visit (INDEPENDENT_AMBULATORY_CARE_PROVIDER_SITE_OTHER): Payer: Medicare Other | Admitting: Vascular Surgery

## 2022-09-10 ENCOUNTER — Encounter: Payer: Self-pay | Admitting: Vascular Surgery

## 2022-09-10 VITALS — BP 86/55 | HR 112 | Temp 97.9°F | Resp 20 | Ht 72.0 in | Wt 161.0 lb

## 2022-09-10 DIAGNOSIS — N186 End stage renal disease: Secondary | ICD-10-CM | POA: Diagnosis not present

## 2022-09-10 NOTE — Progress Notes (Signed)
Patient ID: Christopher Wang, male   DOB: 07-20-1986, 36 y.o.   MRN: [id number]  Reason for Consult: Follow-up   Referred by Rexene Agent, MD  Subjective:     HPI:  Christopher Wang is a 36 y.o. male on dialysis Mondays Wednesdays and Fridays via left forearm AV fistula.  Has had this fistula for multiple years and has had revisions in the past and was scheduled for revision over 1 year ago but was canceled at the time of surgery.  He now has a scab that has been coming and going on the most distal pseudoaneurysm of his fistula he does not have any increased bleeding times actually dialyzed this morning without issue.  Past Medical History:  Diagnosis Date   Acute on chronic renal insufficiency 07/15/2013   Anemia    Edema of lower extremity 08/06/2011   Enteritis 07/15/2013   ESRD (end stage renal disease) on dialysis [facility])    "Jeneen Rinks; MWF" (07/15/2018)   Headache    Hypertension 08/09/2011   Nephrotic syndrome    Scrotal edema 08/06/2011   Syncope 08/03/2013   UTI (urinary tract infection)    Family History  Problem Relation Age of Onset   Hypertension Mother    Past Surgical History:  Procedure Laterality Date   AV FISTULA PLACEMENT Left 07/18/2013   Procedure: ARTERIOVENOUS (AV) FISTULA CREATION ;  Surgeon: Rosetta Posner, MD;  Location: [place];  Service: Vascular;  Laterality: Left;   INSERTION OF DIALYSIS CATHETER Right 07/18/2013   Procedure: INSERTION OF DIALYSIS CATHETER;  Surgeon: Rosetta Posner, MD;  Location: [place];  Service: Vascular;  Laterality: Right;   MASS EXCISION Left 07/26/2019   Procedure: EPIDERMOID CYST REMOVAL OF LEFT EAR;  Surgeon: Izora Gala, MD;  Location: [place];  Service: ENT;  Laterality: Left;   RENAL BIOPSY     REVISON OF ARTERIOVENOUS FISTULA Left 07/15/2018   FOREARM   REVISON OF ARTERIOVENOUS FISTULA Left 07/15/2018   Procedure: REVISION OF ARTERIOVENOUS FISTULA LEFT FOREARM;  Surgeon: Waynetta Sandy, MD;   Location: [place];  Service: Vascular;  Laterality: Left;    Short Social History:  Social History   Tobacco Use   Smoking status: Never    Passive exposure: Yes   Smokeless tobacco: Never  Substance Use Topics   Alcohol use: Not Currently    No Known Allergies  Current Outpatient Medications  Medication Sig Dispense Refill   cinacalcet (SENSIPAR) 60 MG tablet Take by mouth.     FOSRENOL 1000 MG PACK Take 1,000 mg by mouth 2 (two) times daily with a meal.      No current facility-administered medications for this visit.    Review of Systems  Constitutional:  Constitutional negative. HENT: HENT negative.  Eyes: Eyes negative.  Respiratory: Respiratory negative.  Cardiovascular: Cardiovascular negative.  GI: Gastrointestinal negative.  Skin:       Scab on left arm wound Neurological: Neurological negative. Hematologic: Hematologic/lymphatic negative.  Psychiatric: Psychiatric negative.        Objective:  Objective   Vitals:   09/10/22 1015  BP: (!) 86/55  Pulse: (!) 112  Resp: 20  Temp: 97.9 F (36.6 C)  SpO2: 96%  Weight: 161 lb (73 kg)  Height: 6' (1.829 m)   Body mass index is 21.84 kg/m.  Physical Exam HENT:     Head: Normocephalic.     Nose: Nose normal.  Eyes:     Pupils: Pupils are equal,  round, and reactive to light.  Pulmonary:     Effort: Pulmonary effort is normal.  Abdominal:     General: Abdomen is flat.  Musculoskeletal:     Cervical back: Neck supple.     Right lower leg: No edema.     Left lower leg: No edema.     Comments: Left arm avf with palpable thrill, small scab distally without erythema  Skin:    General: Skin is warm and dry.     Capillary Refill: Capillary refill takes less than 2 seconds.  Neurological:     General: No focal deficit present.     Mental Status: He is alert.  Psychiatric:        Mood and Affect: Mood normal.        Behavior: Behavior normal.        Thought Content: Thought content normal.         Judgment: Judgment normal.     Data: No studies today     Assessment/Plan:    36 year old male on dialysis via left forearm AV fistula which does have 1 small scab but has not bled recently.  We discussed revision patient is in agreement and they will avoid sticking at this place in the meantime.  He will call to schedule in the near future.    Waynetta Sandy MD [facility]

## 2023-07-15 ENCOUNTER — Encounter: Payer: Self-pay | Admitting: Podiatry

## 2023-07-15 ENCOUNTER — Ambulatory Visit (INDEPENDENT_AMBULATORY_CARE_PROVIDER_SITE_OTHER): Payer: Medicare Other

## 2023-07-15 ENCOUNTER — Ambulatory Visit (INDEPENDENT_AMBULATORY_CARE_PROVIDER_SITE_OTHER): Payer: Medicare Other | Admitting: Podiatry

## 2023-07-15 DIAGNOSIS — B351 Tinea unguium: Secondary | ICD-10-CM

## 2023-07-15 DIAGNOSIS — M722 Plantar fascial fibromatosis: Secondary | ICD-10-CM

## 2023-07-15 NOTE — Progress Notes (Signed)
Subjective:   Patient ID: Christopher Wang, male   DOB: 37 y.o.   MRN: [id number]   HPI Patient presents stating the pain has left left and he has had history of Planter fasciitis that he is concerned about and also has nail disease that he has concerns about.  He is not a good communicator but was able after were able to talk for a while and does not smoke and tries to be active   Review of Systems  All other systems reviewed and are negative.       Objective:  Physical Exam Vitals and nursing note reviewed.  Constitutional:      Appearance: He is well-developed.  Pulmonary:     Effort: Pulmonary effort is normal.  Musculoskeletal:        General: Normal range of motion.  Skin:    General: Skin is warm.  Neurological:     Mental Status: He is alert.     Neurovascular status found to be intact muscle strength was found to be adequate range of motion subtalar midtarsal joint adequate with the patient found to have mild discomfort in the plantar heel left foot with history of taking prednisone recently which seems to have made a difference with the inflammation.  Nailbeds are thickened bilateral hallux.  Good digital perfusion noted     Assessment:  Plantar fasciitis left improved with medication and shoe gear modification along with nail disease hallux bilateral with the patient who is on dialysis     Plan:  H&P reviewed all conditions x-ray and I do not recommend current treatment as it is improved but I did discuss shoe gear modifications stretching trimming the toenails but do not recommend oral medicines.  Patient will be seen back as symptoms indicate and may require more aggressive approach  X-rays indicate small spur no indication stress fracture arthritis

## 2024-10-20 ENCOUNTER — Ambulatory Visit: Admitting: Podiatry

## 2024-10-26 ENCOUNTER — Ambulatory Visit: Admitting: Podiatry
